# Patient Record
Sex: Female | Born: 1945
Health system: Southern US, Community
[De-identification: ages and names within clinical notes are randomized; demographics above are authoritative.]

## PROBLEM LIST (undated history)

## (undated) DIAGNOSIS — G959 Disease of spinal cord, unspecified: Secondary | ICD-10-CM

## (undated) DIAGNOSIS — G35 Multiple sclerosis: Secondary | ICD-10-CM

## (undated) DIAGNOSIS — M810 Age-related osteoporosis without current pathological fracture: Secondary | ICD-10-CM

## (undated) HISTORY — DX: Disease of spinal cord, unspecified: G95.9

## (undated) HISTORY — DX: Age-related osteoporosis without current pathological fracture: M81.0

## (undated) HISTORY — PX: CERVICAL FUSION: SHX112

## (undated) HISTORY — DX: Multiple sclerosis: G35

---

## 1998-08-09 ENCOUNTER — Other Ambulatory Visit: Admission: RE | Admit: 1998-08-09 | Discharge: 1998-08-09 | Payer: Self-pay | Admitting: Obstetrics and Gynecology

## 1999-06-14 ENCOUNTER — Other Ambulatory Visit: Admission: RE | Admit: 1999-06-14 | Discharge: 1999-06-14 | Payer: Self-pay | Admitting: Gynecology

## 1999-06-26 ENCOUNTER — Encounter: Payer: Self-pay | Admitting: Gynecology

## 1999-06-26 ENCOUNTER — Encounter: Admission: RE | Admit: 1999-06-26 | Discharge: 1999-06-26 | Payer: Self-pay | Admitting: Gynecology

## 2000-06-24 ENCOUNTER — Other Ambulatory Visit: Admission: RE | Admit: 2000-06-24 | Discharge: 2000-06-24 | Payer: Self-pay | Admitting: Gynecology

## 2000-06-26 ENCOUNTER — Encounter: Admission: RE | Admit: 2000-06-26 | Discharge: 2000-06-26 | Payer: Self-pay | Admitting: Gynecology

## 2000-06-26 ENCOUNTER — Encounter: Payer: Self-pay | Admitting: Gynecology

## 2001-02-03 ENCOUNTER — Encounter: Payer: Self-pay | Admitting: Sports Medicine

## 2001-02-03 ENCOUNTER — Ambulatory Visit (HOSPITAL_COMMUNITY): Admission: RE | Admit: 2001-02-03 | Discharge: 2001-02-03 | Payer: Self-pay | Admitting: Sports Medicine

## 2001-06-25 ENCOUNTER — Other Ambulatory Visit: Admission: RE | Admit: 2001-06-25 | Discharge: 2001-06-25 | Payer: Self-pay | Admitting: *Deleted

## 2001-06-28 ENCOUNTER — Encounter: Payer: Self-pay | Admitting: Gynecology

## 2001-06-28 ENCOUNTER — Encounter: Admission: RE | Admit: 2001-06-28 | Discharge: 2001-06-28 | Payer: Self-pay | Admitting: Gynecology

## 2001-12-11 ENCOUNTER — Encounter: Payer: Self-pay | Admitting: Orthopedic Surgery

## 2001-12-11 ENCOUNTER — Ambulatory Visit (HOSPITAL_COMMUNITY): Admission: RE | Admit: 2001-12-11 | Discharge: 2001-12-11 | Payer: Self-pay | Admitting: Orthopedic Surgery

## 2001-12-20 ENCOUNTER — Encounter: Admission: RE | Admit: 2001-12-20 | Discharge: 2001-12-20 | Payer: Self-pay | Admitting: Family Medicine

## 2001-12-20 ENCOUNTER — Encounter: Payer: Self-pay | Admitting: Family Medicine

## 2002-01-25 ENCOUNTER — Encounter: Payer: Self-pay | Admitting: Neurosurgery

## 2002-01-25 ENCOUNTER — Ambulatory Visit (HOSPITAL_COMMUNITY): Admission: RE | Admit: 2002-01-25 | Discharge: 2002-01-25 | Payer: Self-pay | Admitting: Neurosurgery

## 2002-03-09 ENCOUNTER — Encounter: Payer: Self-pay | Admitting: Neurosurgery

## 2002-03-09 ENCOUNTER — Encounter: Admission: RE | Admit: 2002-03-09 | Discharge: 2002-03-09 | Payer: Self-pay | Admitting: Neurosurgery

## 2002-06-14 ENCOUNTER — Encounter: Admission: RE | Admit: 2002-06-14 | Discharge: 2002-06-14 | Payer: Self-pay | Admitting: Neurosurgery

## 2002-06-14 ENCOUNTER — Encounter: Payer: Self-pay | Admitting: Neurosurgery

## 2002-06-28 ENCOUNTER — Other Ambulatory Visit: Admission: RE | Admit: 2002-06-28 | Discharge: 2002-06-28 | Payer: Self-pay | Admitting: Gynecology

## 2002-06-30 ENCOUNTER — Encounter: Admission: RE | Admit: 2002-06-30 | Discharge: 2002-06-30 | Payer: Self-pay | Admitting: Family Medicine

## 2002-06-30 ENCOUNTER — Encounter: Payer: Self-pay | Admitting: Family Medicine

## 2002-10-21 ENCOUNTER — Encounter: Payer: Self-pay | Admitting: Neurosurgery

## 2002-10-21 ENCOUNTER — Ambulatory Visit (HOSPITAL_COMMUNITY): Admission: RE | Admit: 2002-10-21 | Discharge: 2002-10-21 | Payer: Self-pay | Admitting: Neurosurgery

## 2003-04-10 ENCOUNTER — Ambulatory Visit (HOSPITAL_COMMUNITY): Admission: RE | Admit: 2003-04-10 | Discharge: 2003-04-10 | Payer: Self-pay | Admitting: *Deleted

## 2003-07-04 ENCOUNTER — Ambulatory Visit (HOSPITAL_COMMUNITY): Admission: RE | Admit: 2003-07-04 | Discharge: 2003-07-04 | Payer: Self-pay | Admitting: Family Medicine

## 2003-10-08 ENCOUNTER — Ambulatory Visit (HOSPITAL_COMMUNITY): Admission: RE | Admit: 2003-10-08 | Discharge: 2003-10-08 | Payer: Self-pay | Admitting: Neurology

## 2004-07-04 ENCOUNTER — Ambulatory Visit (HOSPITAL_COMMUNITY): Admission: RE | Admit: 2004-07-04 | Discharge: 2004-07-04 | Payer: Self-pay | Admitting: Family Medicine

## 2005-07-08 ENCOUNTER — Ambulatory Visit (HOSPITAL_COMMUNITY): Admission: RE | Admit: 2005-07-08 | Discharge: 2005-07-08 | Payer: Self-pay | Admitting: Family Medicine

## 2006-07-22 ENCOUNTER — Ambulatory Visit (HOSPITAL_COMMUNITY): Admission: RE | Admit: 2006-07-22 | Discharge: 2006-07-22 | Payer: Self-pay | Admitting: Family Medicine

## 2006-07-30 ENCOUNTER — Encounter: Admission: RE | Admit: 2006-07-30 | Discharge: 2006-07-30 | Payer: Self-pay | Admitting: Family Medicine

## 2007-07-26 ENCOUNTER — Ambulatory Visit (HOSPITAL_COMMUNITY): Admission: RE | Admit: 2007-07-26 | Discharge: 2007-07-26 | Payer: Self-pay | Admitting: Family Medicine

## 2008-05-24 ENCOUNTER — Encounter
Admission: RE | Admit: 2008-05-24 | Discharge: 2008-07-27 | Payer: Self-pay | Admitting: Physical Medicine and Rehabilitation

## 2008-07-26 ENCOUNTER — Ambulatory Visit (HOSPITAL_COMMUNITY): Admission: RE | Admit: 2008-07-26 | Discharge: 2008-07-26 | Payer: Self-pay | Admitting: Family Medicine

## 2009-08-01 ENCOUNTER — Ambulatory Visit (HOSPITAL_COMMUNITY): Admission: RE | Admit: 2009-08-01 | Discharge: 2009-08-01 | Payer: Self-pay | Admitting: Family Medicine

## 2010-02-10 ENCOUNTER — Encounter: Payer: Self-pay | Admitting: Family Medicine

## 2010-06-07 NOTE — Op Note (Signed)
NAME:  Amy Bray, Amy Bray                          ACCOUNT NO.:  [id number]   MEDICAL RECORD NO.:  [id number]                   PATIENT TYPE:  OIB   LOCATION:  3172                                 FACILITY:  [facility]   PHYSICIAN:  Donalee Citrin, M.D.                     DATE OF BIRTH:  January 06, 1946   DATE OF PROCEDURE:  01/25/2001  DATE OF DISCHARGE:                                 OPERATIVE REPORT   PREOPERATIVE DIAGNOSIS:  Cervical spondylitic myelopathy from severe spinal  stenosis C4-5.   POSTOPERATIVE DIAGNOSIS:  Cervical spondylitic myelopathy from severe spinal  stenosis C4-5.   PROCEDURE:  Anterior cervical diskectomy and fusion at C4-5 using a 6 mm  patellar wedge  and a 22.5 mm Zephir plate for __________   SURGEON:  Donalee Citrin, M.D.   FIRST ASSISTANT:  _____________   INDICATIONS FOR PROCEDURE:  The patient is a very pleasant 65 year old  female whose had progressive worsening over the last several months of  weakness in her hands and difficulty walking. The patient was initially  worked up in the lumbar spine, subsequent thoracic and cervical spine MRI  was ordered showing severe spinal stenosis at C4-5 otherwise some  spondylitic disease in the cervical spine and normal thoracic spine. This is  felt to be consistent with her myelopathic exam and the patient was  recommended anterior cervical diskectomy and fusion. The risks and benefits  were explained to the patient ___________.   DESCRIPTION OF PROCEDURE:  The patient was brought to the OR, received  general endotracheal anesthesia, positioned supine. Next, extension with 5  pounds of halter traction, the right side of the neck was prepped in the  usual fashion. Preoperative x-ray localized the needle over C4-5 disk space.  A curvilinear incision was made just off the midline to the anterior plate  of the sternocleidomastoid with a #20 blade scalpel. Then the superficial  layer of the platysma was dissected down and divided  longitudinally.  Then  ___________ sternocleidomastoid and strap muscles was developed down to the  prevertebral fascia. The prevertebral fascia was dissected away with  Kitner's. Then intraoperative x-rays confirmed our position in the C4-5 disk  space and this was entered with a #15 blade scalpel. Pituitary rongeurs were  used to remove the anterior portion of the annulus and pituitary rongeurs  were used to remove the anterior osteophytes coming off the C4 vertebral  body. Then using a high speed drill, the end plates were drilled down to the  posterior osteophytes and posterior longitudinal ligament. They had a very  large osteophytic process coming off the C4 vertebral body compressing the  spinal cord.  This was all under bitten with the 1 mm Kerrison punch and the  posterior ___________was identified and removed in a piecemeal fashion.  There was a fair amount of compression of the spinal cord from the uncinate  process of C5 and uncinate process and C4.  Then both C5 neural foramen were  widely opened up using a 1 and 2 mm Kerrison punch and the thecal sac was  widely decompressed. At the end of the diskectomy, there was no further  stenosis appreciated. The wound was copiously irrigated and end plates were  prepared to receive the bone graft.  Interbody __________ was placed and the  6 mm patellar wedge was inserted approximately 1-2 mm deep to the anterior  vertebral line.  Then a Zephir plate, 04.5,WUJ the sized, selected, and  inserted. Four 13 mm Dermalon screws were inserted and locked, the set  screws were tightened down. Again, the wound was copiously irrigated and  meticulous hemostasis was maintained.  The platysma was approximated with 3-  0 interrupted Vicryl and the skin was closed with a running 4-0  subcuticular,  Benzoin and Steri-Strips applied. The patient was taken to the recovery room  in stable condition. Postop x-ray confirmed good position of the bone graft,   screws and plate. At the end of the case all counts were correct __________                                               Donalee Citrin, M.D.    GC/MEDQ  D:  01/25/2002  T:  01/25/2002  Job:  [id number]

## 2010-07-01 ENCOUNTER — Other Ambulatory Visit (HOSPITAL_COMMUNITY): Payer: Self-pay | Admitting: Family Medicine

## 2010-07-01 DIAGNOSIS — Z1231 Encounter for screening mammogram for malignant neoplasm of breast: Secondary | ICD-10-CM

## 2010-08-05 ENCOUNTER — Ambulatory Visit (HOSPITAL_COMMUNITY)
Admission: RE | Admit: 2010-08-05 | Discharge: 2010-08-05 | Disposition: A | Discharge status: Home / Self Care | Payer: Medicare Other | Source: Ambulatory Visit | Attending: Family Medicine | Admitting: Family Medicine

## 2010-08-05 DIAGNOSIS — Z1231 Encounter for screening mammogram for malignant neoplasm of breast: Secondary | ICD-10-CM | POA: Insufficient documentation

## 2011-01-31 DIAGNOSIS — R350 Frequency of micturition: Secondary | ICD-10-CM | POA: Diagnosis not present

## 2011-01-31 DIAGNOSIS — R3915 Urgency of urination: Secondary | ICD-10-CM | POA: Diagnosis not present

## 2011-01-31 DIAGNOSIS — N3941 Urge incontinence: Secondary | ICD-10-CM | POA: Diagnosis not present

## 2011-02-03 DIAGNOSIS — M533 Sacrococcygeal disorders, not elsewhere classified: Secondary | ICD-10-CM | POA: Diagnosis not present

## 2011-02-03 DIAGNOSIS — M47817 Spondylosis without myelopathy or radiculopathy, lumbosacral region: Secondary | ICD-10-CM | POA: Diagnosis not present

## 2011-02-03 DIAGNOSIS — G544 Lumbosacral root disorders, not elsewhere classified: Secondary | ICD-10-CM | POA: Diagnosis not present

## 2011-02-03 DIAGNOSIS — M4712 Other spondylosis with myelopathy, cervical region: Secondary | ICD-10-CM | POA: Diagnosis not present

## 2011-02-21 DIAGNOSIS — N3941 Urge incontinence: Secondary | ICD-10-CM | POA: Diagnosis not present

## 2011-02-21 DIAGNOSIS — R3915 Urgency of urination: Secondary | ICD-10-CM | POA: Diagnosis not present

## 2011-02-21 DIAGNOSIS — R350 Frequency of micturition: Secondary | ICD-10-CM | POA: Diagnosis not present

## 2011-03-14 DIAGNOSIS — R3915 Urgency of urination: Secondary | ICD-10-CM | POA: Diagnosis not present

## 2011-03-14 DIAGNOSIS — N3941 Urge incontinence: Secondary | ICD-10-CM | POA: Diagnosis not present

## 2011-03-14 DIAGNOSIS — R350 Frequency of micturition: Secondary | ICD-10-CM | POA: Diagnosis not present

## 2011-04-04 DIAGNOSIS — N3941 Urge incontinence: Secondary | ICD-10-CM | POA: Diagnosis not present

## 2011-04-22 DIAGNOSIS — N3000 Acute cystitis without hematuria: Secondary | ICD-10-CM | POA: Diagnosis not present

## 2011-04-22 DIAGNOSIS — N3941 Urge incontinence: Secondary | ICD-10-CM | POA: Diagnosis not present

## 2011-04-22 DIAGNOSIS — N319 Neuromuscular dysfunction of bladder, unspecified: Secondary | ICD-10-CM | POA: Diagnosis not present

## 2011-04-25 DIAGNOSIS — R350 Frequency of micturition: Secondary | ICD-10-CM | POA: Diagnosis not present

## 2011-04-25 DIAGNOSIS — N3941 Urge incontinence: Secondary | ICD-10-CM | POA: Diagnosis not present

## 2011-04-25 DIAGNOSIS — R3915 Urgency of urination: Secondary | ICD-10-CM | POA: Diagnosis not present

## 2011-05-05 DIAGNOSIS — I10 Essential (primary) hypertension: Secondary | ICD-10-CM | POA: Insufficient documentation

## 2011-05-05 DIAGNOSIS — M5137 Other intervertebral disc degeneration, lumbosacral region: Secondary | ICD-10-CM | POA: Diagnosis not present

## 2011-05-05 DIAGNOSIS — G959 Disease of spinal cord, unspecified: Secondary | ICD-10-CM | POA: Insufficient documentation

## 2011-05-05 DIAGNOSIS — M533 Sacrococcygeal disorders, not elsewhere classified: Secondary | ICD-10-CM | POA: Diagnosis not present

## 2011-05-05 DIAGNOSIS — G2581 Restless legs syndrome: Secondary | ICD-10-CM | POA: Insufficient documentation

## 2011-05-22 DIAGNOSIS — N3000 Acute cystitis without hematuria: Secondary | ICD-10-CM | POA: Diagnosis not present

## 2011-06-09 DIAGNOSIS — M533 Sacrococcygeal disorders, not elsewhere classified: Secondary | ICD-10-CM | POA: Diagnosis not present

## 2011-06-13 DIAGNOSIS — R350 Frequency of micturition: Secondary | ICD-10-CM | POA: Diagnosis not present

## 2011-06-13 DIAGNOSIS — N3941 Urge incontinence: Secondary | ICD-10-CM | POA: Diagnosis not present

## 2011-06-13 DIAGNOSIS — R3915 Urgency of urination: Secondary | ICD-10-CM | POA: Diagnosis not present

## 2011-06-30 ENCOUNTER — Other Ambulatory Visit (HOSPITAL_COMMUNITY): Payer: Self-pay | Admitting: Family Medicine

## 2011-06-30 DIAGNOSIS — Z1231 Encounter for screening mammogram for malignant neoplasm of breast: Secondary | ICD-10-CM

## 2011-07-04 DIAGNOSIS — N3941 Urge incontinence: Secondary | ICD-10-CM | POA: Diagnosis not present

## 2011-07-17 DIAGNOSIS — M949 Disorder of cartilage, unspecified: Secondary | ICD-10-CM | POA: Diagnosis not present

## 2011-07-17 DIAGNOSIS — R82998 Other abnormal findings in urine: Secondary | ICD-10-CM | POA: Diagnosis not present

## 2011-07-17 DIAGNOSIS — R7301 Impaired fasting glucose: Secondary | ICD-10-CM | POA: Diagnosis not present

## 2011-07-17 DIAGNOSIS — Z8371 Family history of colonic polyps: Secondary | ICD-10-CM | POA: Diagnosis not present

## 2011-07-17 DIAGNOSIS — G992 Myelopathy in diseases classified elsewhere: Secondary | ICD-10-CM | POA: Diagnosis not present

## 2011-07-17 DIAGNOSIS — M899 Disorder of bone, unspecified: Secondary | ICD-10-CM | POA: Diagnosis not present

## 2011-07-17 DIAGNOSIS — Z0000 Encounter for general adult medical examination without abnormal findings: Secondary | ICD-10-CM | POA: Diagnosis not present

## 2011-07-17 DIAGNOSIS — I10 Essential (primary) hypertension: Secondary | ICD-10-CM | POA: Diagnosis not present

## 2011-07-21 DIAGNOSIS — H6120 Impacted cerumen, unspecified ear: Secondary | ICD-10-CM | POA: Diagnosis not present

## 2011-07-22 DIAGNOSIS — R350 Frequency of micturition: Secondary | ICD-10-CM | POA: Diagnosis not present

## 2011-07-22 DIAGNOSIS — N3941 Urge incontinence: Secondary | ICD-10-CM | POA: Diagnosis not present

## 2011-07-22 DIAGNOSIS — R3915 Urgency of urination: Secondary | ICD-10-CM | POA: Diagnosis not present

## 2011-08-08 ENCOUNTER — Ambulatory Visit (HOSPITAL_COMMUNITY)
Admission: RE | Admit: 2011-08-08 | Discharge: 2011-08-08 | Disposition: A | Discharge status: Home / Self Care | Payer: Medicare Other | Source: Ambulatory Visit | Attending: Family Medicine | Admitting: Family Medicine

## 2011-08-08 DIAGNOSIS — Z1231 Encounter for screening mammogram for malignant neoplasm of breast: Secondary | ICD-10-CM

## 2011-08-11 DIAGNOSIS — M4712 Other spondylosis with myelopathy, cervical region: Secondary | ICD-10-CM | POA: Diagnosis not present

## 2011-08-11 DIAGNOSIS — M533 Sacrococcygeal disorders, not elsewhere classified: Secondary | ICD-10-CM | POA: Diagnosis not present

## 2011-08-15 DIAGNOSIS — R3915 Urgency of urination: Secondary | ICD-10-CM | POA: Diagnosis not present

## 2011-08-15 DIAGNOSIS — R350 Frequency of micturition: Secondary | ICD-10-CM | POA: Diagnosis not present

## 2011-08-15 DIAGNOSIS — N3941 Urge incontinence: Secondary | ICD-10-CM | POA: Diagnosis not present

## 2011-09-01 DIAGNOSIS — D235 Other benign neoplasm of skin of trunk: Secondary | ICD-10-CM | POA: Diagnosis not present

## 2011-09-01 DIAGNOSIS — D485 Neoplasm of uncertain behavior of skin: Secondary | ICD-10-CM | POA: Diagnosis not present

## 2011-09-01 DIAGNOSIS — L819 Disorder of pigmentation, unspecified: Secondary | ICD-10-CM | POA: Diagnosis not present

## 2011-09-05 DIAGNOSIS — R3915 Urgency of urination: Secondary | ICD-10-CM | POA: Diagnosis not present

## 2011-09-05 DIAGNOSIS — N3941 Urge incontinence: Secondary | ICD-10-CM | POA: Diagnosis not present

## 2011-09-05 DIAGNOSIS — R350 Frequency of micturition: Secondary | ICD-10-CM | POA: Diagnosis not present

## 2011-09-09 DIAGNOSIS — M533 Sacrococcygeal disorders, not elsewhere classified: Secondary | ICD-10-CM | POA: Diagnosis not present

## 2011-09-15 DIAGNOSIS — M533 Sacrococcygeal disorders, not elsewhere classified: Secondary | ICD-10-CM | POA: Diagnosis not present

## 2011-09-19 DIAGNOSIS — M533 Sacrococcygeal disorders, not elsewhere classified: Secondary | ICD-10-CM | POA: Diagnosis not present

## 2011-09-26 DIAGNOSIS — R3915 Urgency of urination: Secondary | ICD-10-CM | POA: Diagnosis not present

## 2011-09-26 DIAGNOSIS — N3941 Urge incontinence: Secondary | ICD-10-CM | POA: Diagnosis not present

## 2011-09-26 DIAGNOSIS — R350 Frequency of micturition: Secondary | ICD-10-CM | POA: Diagnosis not present

## 2011-10-03 DIAGNOSIS — M533 Sacrococcygeal disorders, not elsewhere classified: Secondary | ICD-10-CM | POA: Diagnosis not present

## 2011-10-13 DIAGNOSIS — D485 Neoplasm of uncertain behavior of skin: Secondary | ICD-10-CM | POA: Diagnosis not present

## 2011-10-13 DIAGNOSIS — L905 Scar conditions and fibrosis of skin: Secondary | ICD-10-CM | POA: Diagnosis not present

## 2011-10-17 DIAGNOSIS — N3941 Urge incontinence: Secondary | ICD-10-CM | POA: Diagnosis not present

## 2011-10-17 DIAGNOSIS — R3915 Urgency of urination: Secondary | ICD-10-CM | POA: Diagnosis not present

## 2011-10-17 DIAGNOSIS — R350 Frequency of micturition: Secondary | ICD-10-CM | POA: Diagnosis not present

## 2011-10-29 DIAGNOSIS — G2581 Restless legs syndrome: Secondary | ICD-10-CM | POA: Diagnosis not present

## 2011-10-29 DIAGNOSIS — R279 Unspecified lack of coordination: Secondary | ICD-10-CM | POA: Diagnosis not present

## 2011-10-29 DIAGNOSIS — G5610 Other lesions of median nerve, unspecified upper limb: Secondary | ICD-10-CM | POA: Diagnosis not present

## 2011-10-29 DIAGNOSIS — G9589 Other specified diseases of spinal cord: Secondary | ICD-10-CM | POA: Diagnosis not present

## 2011-10-29 DIAGNOSIS — G959 Disease of spinal cord, unspecified: Secondary | ICD-10-CM | POA: Diagnosis not present

## 2011-10-29 DIAGNOSIS — R209 Unspecified disturbances of skin sensation: Secondary | ICD-10-CM | POA: Diagnosis not present

## 2011-10-29 DIAGNOSIS — M5412 Radiculopathy, cervical region: Secondary | ICD-10-CM | POA: Diagnosis not present

## 2011-10-29 DIAGNOSIS — M216X9 Other acquired deformities of unspecified foot: Secondary | ICD-10-CM | POA: Diagnosis not present

## 2011-10-29 DIAGNOSIS — IMO0002 Reserved for concepts with insufficient information to code with codable children: Secondary | ICD-10-CM | POA: Diagnosis not present

## 2011-10-29 DIAGNOSIS — E531 Pyridoxine deficiency: Secondary | ICD-10-CM | POA: Diagnosis not present

## 2011-10-29 DIAGNOSIS — R634 Abnormal weight loss: Secondary | ICD-10-CM | POA: Diagnosis not present

## 2011-11-06 DIAGNOSIS — R930 Abnormal findings on diagnostic imaging of skull and head, not elsewhere classified: Secondary | ICD-10-CM | POA: Diagnosis not present

## 2011-11-06 DIAGNOSIS — M47814 Spondylosis without myelopathy or radiculopathy, thoracic region: Secondary | ICD-10-CM | POA: Diagnosis not present

## 2011-11-06 DIAGNOSIS — R5381 Other malaise: Secondary | ICD-10-CM | POA: Diagnosis not present

## 2011-11-06 DIAGNOSIS — R5383 Other fatigue: Secondary | ICD-10-CM | POA: Diagnosis not present

## 2011-11-06 DIAGNOSIS — M5126 Other intervertebral disc displacement, lumbar region: Secondary | ICD-10-CM | POA: Diagnosis not present

## 2011-11-07 DIAGNOSIS — N3941 Urge incontinence: Secondary | ICD-10-CM | POA: Diagnosis not present

## 2011-11-28 DIAGNOSIS — N3941 Urge incontinence: Secondary | ICD-10-CM | POA: Diagnosis not present

## 2011-11-28 DIAGNOSIS — R350 Frequency of micturition: Secondary | ICD-10-CM | POA: Diagnosis not present

## 2011-11-28 DIAGNOSIS — R3915 Urgency of urination: Secondary | ICD-10-CM | POA: Diagnosis not present

## 2011-12-29 DIAGNOSIS — M5000 Cervical disc disorder with myelopathy, unspecified cervical region: Secondary | ICD-10-CM | POA: Diagnosis not present

## 2011-12-29 DIAGNOSIS — M533 Sacrococcygeal disorders, not elsewhere classified: Secondary | ICD-10-CM | POA: Diagnosis not present

## 2012-01-02 DIAGNOSIS — N3941 Urge incontinence: Secondary | ICD-10-CM | POA: Diagnosis not present

## 2012-01-02 DIAGNOSIS — R350 Frequency of micturition: Secondary | ICD-10-CM | POA: Diagnosis not present

## 2012-01-02 DIAGNOSIS — R3915 Urgency of urination: Secondary | ICD-10-CM | POA: Diagnosis not present

## 2012-01-05 DIAGNOSIS — L905 Scar conditions and fibrosis of skin: Secondary | ICD-10-CM | POA: Diagnosis not present

## 2012-01-07 DIAGNOSIS — R292 Abnormal reflex: Secondary | ICD-10-CM | POA: Diagnosis not present

## 2012-01-07 DIAGNOSIS — M5412 Radiculopathy, cervical region: Secondary | ICD-10-CM | POA: Diagnosis not present

## 2012-01-07 DIAGNOSIS — IMO0002 Reserved for concepts with insufficient information to code with codable children: Secondary | ICD-10-CM | POA: Diagnosis not present

## 2012-01-07 DIAGNOSIS — M216X9 Other acquired deformities of unspecified foot: Secondary | ICD-10-CM | POA: Diagnosis not present

## 2012-01-07 DIAGNOSIS — G2581 Restless legs syndrome: Secondary | ICD-10-CM | POA: Diagnosis not present

## 2012-01-30 DIAGNOSIS — R3915 Urgency of urination: Secondary | ICD-10-CM | POA: Diagnosis not present

## 2012-01-30 DIAGNOSIS — R350 Frequency of micturition: Secondary | ICD-10-CM | POA: Diagnosis not present

## 2012-01-30 DIAGNOSIS — N3941 Urge incontinence: Secondary | ICD-10-CM | POA: Diagnosis not present

## 2012-02-05 DIAGNOSIS — M533 Sacrococcygeal disorders, not elsewhere classified: Secondary | ICD-10-CM | POA: Diagnosis not present

## 2012-02-05 DIAGNOSIS — M4712 Other spondylosis with myelopathy, cervical region: Secondary | ICD-10-CM | POA: Diagnosis not present

## 2012-02-05 DIAGNOSIS — Z79899 Other long term (current) drug therapy: Secondary | ICD-10-CM | POA: Diagnosis not present

## 2012-02-27 DIAGNOSIS — R3915 Urgency of urination: Secondary | ICD-10-CM | POA: Diagnosis not present

## 2012-02-27 DIAGNOSIS — N3941 Urge incontinence: Secondary | ICD-10-CM | POA: Diagnosis not present

## 2012-02-27 DIAGNOSIS — R350 Frequency of micturition: Secondary | ICD-10-CM | POA: Diagnosis not present

## 2012-03-10 DIAGNOSIS — G2581 Restless legs syndrome: Secondary | ICD-10-CM | POA: Diagnosis not present

## 2012-03-10 DIAGNOSIS — IMO0002 Reserved for concepts with insufficient information to code with codable children: Secondary | ICD-10-CM | POA: Diagnosis not present

## 2012-03-10 DIAGNOSIS — M5412 Radiculopathy, cervical region: Secondary | ICD-10-CM | POA: Diagnosis not present

## 2012-04-05 DIAGNOSIS — M533 Sacrococcygeal disorders, not elsewhere classified: Secondary | ICD-10-CM | POA: Diagnosis not present

## 2012-04-05 DIAGNOSIS — G5770 Causalgia of unspecified lower limb: Secondary | ICD-10-CM | POA: Diagnosis not present

## 2012-04-23 DIAGNOSIS — N3941 Urge incontinence: Secondary | ICD-10-CM | POA: Diagnosis not present

## 2012-05-06 DIAGNOSIS — G5600 Carpal tunnel syndrome, unspecified upper limb: Secondary | ICD-10-CM | POA: Diagnosis not present

## 2012-05-06 DIAGNOSIS — G2581 Restless legs syndrome: Secondary | ICD-10-CM | POA: Diagnosis not present

## 2012-05-06 DIAGNOSIS — IMO0002 Reserved for concepts with insufficient information to code with codable children: Secondary | ICD-10-CM | POA: Diagnosis not present

## 2012-05-06 DIAGNOSIS — R292 Abnormal reflex: Secondary | ICD-10-CM | POA: Diagnosis not present

## 2012-05-06 DIAGNOSIS — M5412 Radiculopathy, cervical region: Secondary | ICD-10-CM | POA: Diagnosis not present

## 2012-05-14 DIAGNOSIS — N3000 Acute cystitis without hematuria: Secondary | ICD-10-CM | POA: Diagnosis not present

## 2012-05-17 DIAGNOSIS — H2510 Age-related nuclear cataract, unspecified eye: Secondary | ICD-10-CM | POA: Diagnosis not present

## 2012-05-19 DIAGNOSIS — M533 Sacrococcygeal disorders, not elsewhere classified: Secondary | ICD-10-CM | POA: Diagnosis not present

## 2012-05-21 DIAGNOSIS — R350 Frequency of micturition: Secondary | ICD-10-CM | POA: Diagnosis not present

## 2012-05-21 DIAGNOSIS — R3915 Urgency of urination: Secondary | ICD-10-CM | POA: Diagnosis not present

## 2012-05-21 DIAGNOSIS — N3941 Urge incontinence: Secondary | ICD-10-CM | POA: Diagnosis not present

## 2012-06-18 DIAGNOSIS — N3941 Urge incontinence: Secondary | ICD-10-CM | POA: Diagnosis not present

## 2012-07-15 ENCOUNTER — Other Ambulatory Visit (HOSPITAL_COMMUNITY): Payer: Self-pay | Admitting: Family Medicine

## 2012-07-15 DIAGNOSIS — Z1231 Encounter for screening mammogram for malignant neoplasm of breast: Secondary | ICD-10-CM

## 2012-08-09 ENCOUNTER — Ambulatory Visit (HOSPITAL_COMMUNITY)
Admission: RE | Admit: 2012-08-09 | Discharge: 2012-08-09 | Disposition: A | Discharge status: Home / Self Care | Payer: Medicare Other | Source: Ambulatory Visit | Attending: Family Medicine | Admitting: Family Medicine

## 2012-08-09 DIAGNOSIS — Z1231 Encounter for screening mammogram for malignant neoplasm of breast: Secondary | ICD-10-CM

## 2012-08-13 DIAGNOSIS — N3941 Urge incontinence: Secondary | ICD-10-CM | POA: Diagnosis not present

## 2012-08-23 DIAGNOSIS — M533 Sacrococcygeal disorders, not elsewhere classified: Secondary | ICD-10-CM | POA: Diagnosis not present

## 2012-09-10 DIAGNOSIS — R3915 Urgency of urination: Secondary | ICD-10-CM | POA: Diagnosis not present

## 2012-09-10 DIAGNOSIS — R350 Frequency of micturition: Secondary | ICD-10-CM | POA: Diagnosis not present

## 2012-09-10 DIAGNOSIS — N3941 Urge incontinence: Secondary | ICD-10-CM | POA: Diagnosis not present

## 2012-09-16 DIAGNOSIS — Z79899 Other long term (current) drug therapy: Secondary | ICD-10-CM | POA: Diagnosis not present

## 2012-09-16 DIAGNOSIS — M533 Sacrococcygeal disorders, not elsewhere classified: Secondary | ICD-10-CM | POA: Diagnosis not present

## 2012-10-01 DIAGNOSIS — R3915 Urgency of urination: Secondary | ICD-10-CM | POA: Diagnosis not present

## 2012-10-01 DIAGNOSIS — N3941 Urge incontinence: Secondary | ICD-10-CM | POA: Diagnosis not present

## 2012-10-01 DIAGNOSIS — R350 Frequency of micturition: Secondary | ICD-10-CM | POA: Diagnosis not present

## 2012-10-11 DIAGNOSIS — M545 Low back pain, unspecified: Secondary | ICD-10-CM | POA: Diagnosis not present

## 2012-10-11 DIAGNOSIS — Z01818 Encounter for other preprocedural examination: Secondary | ICD-10-CM | POA: Diagnosis not present

## 2012-10-11 DIAGNOSIS — M533 Sacrococcygeal disorders, not elsewhere classified: Secondary | ICD-10-CM | POA: Diagnosis not present

## 2012-10-11 DIAGNOSIS — Z79899 Other long term (current) drug therapy: Secondary | ICD-10-CM | POA: Diagnosis not present

## 2012-10-11 DIAGNOSIS — M5000 Cervical disc disorder with myelopathy, unspecified cervical region: Secondary | ICD-10-CM | POA: Diagnosis not present

## 2012-10-21 DIAGNOSIS — Z0000 Encounter for general adult medical examination without abnormal findings: Secondary | ICD-10-CM | POA: Diagnosis not present

## 2012-10-21 DIAGNOSIS — H6120 Impacted cerumen, unspecified ear: Secondary | ICD-10-CM | POA: Diagnosis not present

## 2012-10-21 DIAGNOSIS — R7301 Impaired fasting glucose: Secondary | ICD-10-CM | POA: Diagnosis not present

## 2012-10-21 DIAGNOSIS — I10 Essential (primary) hypertension: Secondary | ICD-10-CM | POA: Diagnosis not present

## 2012-10-21 DIAGNOSIS — Z23 Encounter for immunization: Secondary | ICD-10-CM | POA: Diagnosis not present

## 2012-10-21 DIAGNOSIS — M949 Disorder of cartilage, unspecified: Secondary | ICD-10-CM | POA: Diagnosis not present

## 2012-10-21 DIAGNOSIS — G992 Myelopathy in diseases classified elsewhere: Secondary | ICD-10-CM | POA: Diagnosis not present

## 2012-10-21 DIAGNOSIS — M899 Disorder of bone, unspecified: Secondary | ICD-10-CM | POA: Diagnosis not present

## 2012-10-22 DIAGNOSIS — R350 Frequency of micturition: Secondary | ICD-10-CM | POA: Diagnosis not present

## 2012-10-22 DIAGNOSIS — R3915 Urgency of urination: Secondary | ICD-10-CM | POA: Diagnosis not present

## 2012-10-22 DIAGNOSIS — N3941 Urge incontinence: Secondary | ICD-10-CM | POA: Diagnosis not present

## 2012-11-10 DIAGNOSIS — M533 Sacrococcygeal disorders, not elsewhere classified: Secondary | ICD-10-CM | POA: Diagnosis not present

## 2012-11-12 DIAGNOSIS — N3941 Urge incontinence: Secondary | ICD-10-CM | POA: Diagnosis not present

## 2012-11-12 DIAGNOSIS — R3915 Urgency of urination: Secondary | ICD-10-CM | POA: Diagnosis not present

## 2012-11-12 DIAGNOSIS — R350 Frequency of micturition: Secondary | ICD-10-CM | POA: Diagnosis not present

## 2012-12-03 DIAGNOSIS — R350 Frequency of micturition: Secondary | ICD-10-CM | POA: Diagnosis not present

## 2012-12-03 DIAGNOSIS — R3915 Urgency of urination: Secondary | ICD-10-CM | POA: Diagnosis not present

## 2012-12-03 DIAGNOSIS — N3941 Urge incontinence: Secondary | ICD-10-CM | POA: Diagnosis not present

## 2012-12-24 DIAGNOSIS — R3915 Urgency of urination: Secondary | ICD-10-CM | POA: Diagnosis not present

## 2012-12-24 DIAGNOSIS — N3941 Urge incontinence: Secondary | ICD-10-CM | POA: Diagnosis not present

## 2012-12-24 DIAGNOSIS — R350 Frequency of micturition: Secondary | ICD-10-CM | POA: Diagnosis not present

## 2013-01-04 DIAGNOSIS — L819 Disorder of pigmentation, unspecified: Secondary | ICD-10-CM | POA: Diagnosis not present

## 2013-01-04 DIAGNOSIS — L821 Other seborrheic keratosis: Secondary | ICD-10-CM | POA: Diagnosis not present

## 2013-01-04 DIAGNOSIS — L820 Inflamed seborrheic keratosis: Secondary | ICD-10-CM | POA: Diagnosis not present

## 2013-01-04 DIAGNOSIS — D1801 Hemangioma of skin and subcutaneous tissue: Secondary | ICD-10-CM | POA: Diagnosis not present

## 2013-01-04 DIAGNOSIS — D235 Other benign neoplasm of skin of trunk: Secondary | ICD-10-CM | POA: Diagnosis not present

## 2013-01-21 DIAGNOSIS — N3941 Urge incontinence: Secondary | ICD-10-CM | POA: Diagnosis not present

## 2013-01-31 DIAGNOSIS — IMO0002 Reserved for concepts with insufficient information to code with codable children: Secondary | ICD-10-CM | POA: Diagnosis not present

## 2013-01-31 DIAGNOSIS — M5410 Radiculopathy, site unspecified: Secondary | ICD-10-CM | POA: Insufficient documentation

## 2013-02-14 DIAGNOSIS — M533 Sacrococcygeal disorders, not elsewhere classified: Secondary | ICD-10-CM | POA: Diagnosis not present

## 2013-02-14 DIAGNOSIS — IMO0002 Reserved for concepts with insufficient information to code with codable children: Secondary | ICD-10-CM | POA: Diagnosis not present

## 2013-02-18 DIAGNOSIS — R3915 Urgency of urination: Secondary | ICD-10-CM | POA: Diagnosis not present

## 2013-02-18 DIAGNOSIS — N3941 Urge incontinence: Secondary | ICD-10-CM | POA: Diagnosis not present

## 2013-02-18 DIAGNOSIS — R350 Frequency of micturition: Secondary | ICD-10-CM | POA: Diagnosis not present

## 2013-03-04 DIAGNOSIS — Z79899 Other long term (current) drug therapy: Secondary | ICD-10-CM | POA: Diagnosis not present

## 2013-03-04 DIAGNOSIS — IMO0002 Reserved for concepts with insufficient information to code with codable children: Secondary | ICD-10-CM | POA: Diagnosis not present

## 2013-03-04 DIAGNOSIS — M533 Sacrococcygeal disorders, not elsewhere classified: Secondary | ICD-10-CM | POA: Diagnosis not present

## 2013-04-01 DIAGNOSIS — R3915 Urgency of urination: Secondary | ICD-10-CM | POA: Diagnosis not present

## 2013-04-01 DIAGNOSIS — R350 Frequency of micturition: Secondary | ICD-10-CM | POA: Diagnosis not present

## 2013-04-01 DIAGNOSIS — N3941 Urge incontinence: Secondary | ICD-10-CM | POA: Diagnosis not present

## 2013-04-29 DIAGNOSIS — N3941 Urge incontinence: Secondary | ICD-10-CM | POA: Diagnosis not present

## 2013-04-29 DIAGNOSIS — R350 Frequency of micturition: Secondary | ICD-10-CM | POA: Diagnosis not present

## 2013-04-29 DIAGNOSIS — R3915 Urgency of urination: Secondary | ICD-10-CM | POA: Diagnosis not present

## 2013-05-19 DIAGNOSIS — H2510 Age-related nuclear cataract, unspecified eye: Secondary | ICD-10-CM | POA: Diagnosis not present

## 2013-05-27 DIAGNOSIS — N3941 Urge incontinence: Secondary | ICD-10-CM | POA: Diagnosis not present

## 2013-06-24 DIAGNOSIS — N3941 Urge incontinence: Secondary | ICD-10-CM | POA: Diagnosis not present

## 2013-07-05 ENCOUNTER — Other Ambulatory Visit (HOSPITAL_COMMUNITY): Payer: Self-pay | Admitting: Family Medicine

## 2013-07-05 DIAGNOSIS — Z1231 Encounter for screening mammogram for malignant neoplasm of breast: Secondary | ICD-10-CM

## 2013-07-05 DIAGNOSIS — M8580 Other specified disorders of bone density and structure, unspecified site: Secondary | ICD-10-CM

## 2013-07-11 DIAGNOSIS — M533 Sacrococcygeal disorders, not elsewhere classified: Secondary | ICD-10-CM | POA: Diagnosis not present

## 2013-07-11 DIAGNOSIS — M549 Dorsalgia, unspecified: Secondary | ICD-10-CM | POA: Diagnosis not present

## 2013-07-11 DIAGNOSIS — IMO0002 Reserved for concepts with insufficient information to code with codable children: Secondary | ICD-10-CM | POA: Diagnosis not present

## 2013-07-29 DIAGNOSIS — N3941 Urge incontinence: Secondary | ICD-10-CM | POA: Diagnosis not present

## 2013-08-09 DIAGNOSIS — N3941 Urge incontinence: Secondary | ICD-10-CM | POA: Diagnosis not present

## 2013-08-09 DIAGNOSIS — N3000 Acute cystitis without hematuria: Secondary | ICD-10-CM | POA: Diagnosis not present

## 2013-08-09 DIAGNOSIS — N319 Neuromuscular dysfunction of bladder, unspecified: Secondary | ICD-10-CM | POA: Diagnosis not present

## 2013-08-10 ENCOUNTER — Other Ambulatory Visit (HOSPITAL_COMMUNITY): Payer: Self-pay | Admitting: Family Medicine

## 2013-08-10 ENCOUNTER — Ambulatory Visit (HOSPITAL_COMMUNITY)
Admission: RE | Admit: 2013-08-10 | Discharge: 2013-08-10 | Disposition: A | Discharge status: Home / Self Care | Payer: Medicare Other | Source: Ambulatory Visit | Attending: Family Medicine | Admitting: Family Medicine

## 2013-08-10 DIAGNOSIS — Z1231 Encounter for screening mammogram for malignant neoplasm of breast: Secondary | ICD-10-CM

## 2013-08-10 DIAGNOSIS — Z13820 Encounter for screening for osteoporosis: Secondary | ICD-10-CM | POA: Diagnosis not present

## 2013-08-10 DIAGNOSIS — Z780 Asymptomatic menopausal state: Secondary | ICD-10-CM | POA: Diagnosis not present

## 2013-08-10 DIAGNOSIS — M8580 Other specified disorders of bone density and structure, unspecified site: Secondary | ICD-10-CM

## 2013-08-10 DIAGNOSIS — M810 Age-related osteoporosis without current pathological fracture: Secondary | ICD-10-CM | POA: Diagnosis not present

## 2013-08-17 DIAGNOSIS — Z79899 Other long term (current) drug therapy: Secondary | ICD-10-CM | POA: Diagnosis not present

## 2013-08-17 DIAGNOSIS — M533 Sacrococcygeal disorders, not elsewhere classified: Secondary | ICD-10-CM | POA: Diagnosis not present

## 2013-08-26 DIAGNOSIS — R3915 Urgency of urination: Secondary | ICD-10-CM | POA: Diagnosis not present

## 2013-08-26 DIAGNOSIS — R350 Frequency of micturition: Secondary | ICD-10-CM | POA: Diagnosis not present

## 2013-08-26 DIAGNOSIS — N3941 Urge incontinence: Secondary | ICD-10-CM | POA: Diagnosis not present

## 2013-08-31 DIAGNOSIS — Z1211 Encounter for screening for malignant neoplasm of colon: Secondary | ICD-10-CM | POA: Diagnosis not present

## 2013-08-31 DIAGNOSIS — K648 Other hemorrhoids: Secondary | ICD-10-CM | POA: Diagnosis not present

## 2013-08-31 DIAGNOSIS — D126 Benign neoplasm of colon, unspecified: Secondary | ICD-10-CM | POA: Diagnosis not present

## 2013-08-31 DIAGNOSIS — K620 Anal polyp: Secondary | ICD-10-CM | POA: Diagnosis not present

## 2013-08-31 DIAGNOSIS — Z8371 Family history of colonic polyps: Secondary | ICD-10-CM | POA: Diagnosis not present

## 2013-08-31 DIAGNOSIS — K621 Rectal polyp: Secondary | ICD-10-CM | POA: Diagnosis not present

## 2013-09-23 DIAGNOSIS — N3941 Urge incontinence: Secondary | ICD-10-CM | POA: Diagnosis not present

## 2013-10-15 DIAGNOSIS — Z23 Encounter for immunization: Secondary | ICD-10-CM | POA: Diagnosis not present

## 2013-10-21 DIAGNOSIS — N3941 Urge incontinence: Secondary | ICD-10-CM | POA: Diagnosis not present

## 2013-10-31 DIAGNOSIS — M62838 Other muscle spasm: Secondary | ICD-10-CM | POA: Diagnosis not present

## 2013-10-31 DIAGNOSIS — M79671 Pain in right foot: Secondary | ICD-10-CM | POA: Diagnosis not present

## 2013-10-31 DIAGNOSIS — M79672 Pain in left foot: Secondary | ICD-10-CM | POA: Diagnosis not present

## 2013-11-10 DIAGNOSIS — M810 Age-related osteoporosis without current pathological fracture: Secondary | ICD-10-CM | POA: Diagnosis not present

## 2013-11-10 DIAGNOSIS — Z0000 Encounter for general adult medical examination without abnormal findings: Secondary | ICD-10-CM | POA: Diagnosis not present

## 2013-11-10 DIAGNOSIS — R8290 Unspecified abnormal findings in urine: Secondary | ICD-10-CM | POA: Diagnosis not present

## 2013-11-10 DIAGNOSIS — Z8371 Family history of colonic polyps: Secondary | ICD-10-CM | POA: Diagnosis not present

## 2013-11-10 DIAGNOSIS — Z23 Encounter for immunization: Secondary | ICD-10-CM | POA: Diagnosis not present

## 2013-11-10 DIAGNOSIS — R7301 Impaired fasting glucose: Secondary | ICD-10-CM | POA: Diagnosis not present

## 2013-11-10 DIAGNOSIS — H6123 Impacted cerumen, bilateral: Secondary | ICD-10-CM | POA: Diagnosis not present

## 2013-11-10 DIAGNOSIS — M21372 Foot drop, left foot: Secondary | ICD-10-CM | POA: Diagnosis not present

## 2013-11-10 DIAGNOSIS — I10 Essential (primary) hypertension: Secondary | ICD-10-CM | POA: Diagnosis not present

## 2013-11-14 DIAGNOSIS — M25559 Pain in unspecified hip: Secondary | ICD-10-CM | POA: Diagnosis not present

## 2013-11-14 DIAGNOSIS — M533 Sacrococcygeal disorders, not elsewhere classified: Secondary | ICD-10-CM | POA: Diagnosis not present

## 2013-11-14 DIAGNOSIS — M545 Low back pain: Secondary | ICD-10-CM | POA: Diagnosis not present

## 2013-11-18 DIAGNOSIS — N3941 Urge incontinence: Secondary | ICD-10-CM | POA: Diagnosis not present

## 2013-11-18 DIAGNOSIS — R350 Frequency of micturition: Secondary | ICD-10-CM | POA: Diagnosis not present

## 2013-11-18 DIAGNOSIS — R3915 Urgency of urination: Secondary | ICD-10-CM | POA: Diagnosis not present

## 2013-11-21 ENCOUNTER — Encounter: Payer: Self-pay | Admitting: Diagnostic Neuroimaging

## 2013-11-21 ENCOUNTER — Ambulatory Visit (INDEPENDENT_AMBULATORY_CARE_PROVIDER_SITE_OTHER): Payer: Medicare Other | Admitting: Diagnostic Neuroimaging

## 2013-11-21 VITALS — BP 106/71 | HR 98 | Ht 61.0 in | Wt 115.0 lb

## 2013-11-21 DIAGNOSIS — R29898 Other symptoms and signs involving the musculoskeletal system: Secondary | ICD-10-CM | POA: Diagnosis not present

## 2013-11-21 DIAGNOSIS — G8220 Paraplegia, unspecified: Secondary | ICD-10-CM

## 2013-11-21 DIAGNOSIS — R258 Other abnormal involuntary movements: Secondary | ICD-10-CM

## 2013-11-21 DIAGNOSIS — R252 Cramp and spasm: Secondary | ICD-10-CM

## 2013-11-21 DIAGNOSIS — G959 Disease of spinal cord, unspecified: Secondary | ICD-10-CM | POA: Diagnosis not present

## 2013-11-21 NOTE — Progress Notes (Signed)
[facility]  PATIENT: Amy Bray DOB: [birth year]  REFERRING CLINICIAN: Donnie Mesa HISTORY FROM: patient and husband  REASON FOR VISIT: new consult   HISTORICAL  CHIEF COMPLAINT:  Chief Complaint  Patient presents with  . Neurologic Problem    foot drop    HISTORY OF PRESENT ILLNESS:   68 year old right-handed female here for valuation of lower extremity spasms and weakness.  2002 patient developed onset of right leg stiffness, spasm, weakness. Patient was diagnosed with cervical spine disease, bone spur enriching, and treated with anterior cervical discectomy and fusion in 2003. Following this her right leg weakness and spasms significantly improved. However one year later her symptoms return. Patient followed up with several surgeons and ultimately had a second cervical spine surgery in 2006. Following this patient had slight improvement of right leg however within several months her weakness, spasms and gait continued to decline. Patient transitioned needing a cane to walk.  2013 patient was a value by a neurologist in [place], to evaluate her right leg weakness. Apparently patient had a "multiple sclerosis" workup including MRI of the brain and extensive blood testing. Patient also had EMG nerve conduction studies looking for peroneal neuropathy but apparently all studies were normal. MRI of the brain did show a right medulla lesion which was reported as a stroke.  Over the past 1 year patient has developed new onset left leg weakness, spasm, stiffness. Patient is followed up with orthopedic surgery who then referred patient to me for evaluation. Last MRI of cervical spine was in 2006 at [facility].  No bowel or bladder incontinence. No significant numbness or tingling. No weakness in upper extremities. No vision loss, vision changes, slurred speech or trouble talking.   REVIEW OF SYSTEMS: Full 14 system review of systems performed and notable only for  weakness.  ALLERGIES: No Known Allergies  HOME MEDICATIONS: No outpatient prescriptions prior to visit.   No facility-administered medications prior to visit.    PAST MEDICAL HISTORY: History reviewed. No pertinent past medical history.  PAST SURGICAL HISTORY: Past Surgical History  Procedure Laterality Date  . Cervical fusion  01/2001, 01/2004    C3-C6    FAMILY HISTORY: Family History  Problem Relation Age of Onset  . Transient ischemic attack Mother   . Pancreatic cancer Father     SOCIAL HISTORY:  History   Social History  . Marital Status: Married    Spouse Name: Taraji Mungo    Number of Children: 0  . Years of Education: College   Occupational History  .  Other    n/a   Social History Main Topics  . Smoking status: Former Smoker -- 1.00 packs/day for 15 years    Types: Cigarettes    Quit date: 01/21/1983  . Smokeless tobacco: Never Used  . Alcohol Use: 0.0 oz/week    0 Not specified per week     Comment: occasionally- wine  . Drug Use: No  . Sexual Activity: Not on file   Other Topics Concern  . Not on file   Social History Narrative   Patient lives at home with her spouse.   Caffeine Use-none     PHYSICAL EXAM  Filed Vitals:   11/21/13 1044  BP: 106/71  Pulse: 98  Height: 5\' 1"  (1.549 m)  Weight: 115 lb (52.164 kg)    Not recorded      Visual Acuity Screening   Right eye Left eye Both eyes  Without correction:  With correction: 20/70 20/100      Body mass index is 21.74 kg/(m^2).  GENERAL EXAM: Patient is in no distress; well developed, nourished and groomed; neck is supple  CARDIOVASCULAR: Regular rate and rhythm, no murmurs, no carotid bruits  NEUROLOGIC: MENTAL STATUS: awake, alert, oriented to person, place and time, recent and remote memory intact, normal attention and concentration, language fluent, comprehension intact, naming intact, fund of knowledge appropriate CRANIAL NERVE: no papilledema on fundoscopic  exam, pupils equal and reactive to light, visual fields full to confrontation, extraocular muscles intact, no nystagmus, facial sensation and strength symmetric, hearing intact, palate elevates symmetrically, uvula midline, shoulder shrug symmetric, tongue midline. MOTOR: normal bulk and tone in BUE; SIG INCREASED EXT TONE IN BLE; BLE (HF 4, KE 4, KF 3, DF 4) SENSORY: DECR VIB AT TEOS (RIGHT 8 SEC, LEFT 6 SEC); NORMAL PP, TEMP COORDINATION: finger-nose-finger, fine finger movements normal REFLEXES: BUE 3 WITH SPREAD; BLE (KNEES 4, ANKLES 2); POSITIVE HOFFMANS ON LEFT; UPGOING TOES BILATERALLY GAIT/STATION: SPASTIC, PARAPARETIC GAIT; UNSTEADY WITH SINGLE POINT CANE     DIAGNOSTIC DATA (LABS, IMAGING, TESTING) - I reviewed patient records, labs, notes, testing and imaging myself where available.  No results found for: WBC, HGB, HCT, MCV, PLT No results found for: NA, K, CL, CO2, GLUCOSE, BUN, CREATININE, CALCIUM, PROT, ALBUMIN, AST, ALT, ALKPHOS, BILITOT, GFRNONAA, GFRAA No results found for: CHOL, HDL, LDLCALC, LDLDIRECT, TRIG, CHOLHDL No results found for: HGBA1C No results found for: VITAMINB12 No results found for: TSH   I reviewed images myself. In my review, I think there is a 2cm spinal cord lesion (right side, C6-C7) and a smaller left T1 lesion, suspicious for demyelinating disease, on the MRI cervical spine from 10/08/03. No adjacent disc or bone degeneration or spinal stenosis. The MRI report states that the cord appears normal. -VRP  11/06/11 MRI brain (report only) - "subtle area of increased T2 intensity in the right posterior upper medulla likely representing old infarct or possibly area of previous inflammation. Otherwise normal MRI of the brain."  10/08/03 MRI cervical spine (images reviewed) - Satisfactory anterior plate fusion of C4 and C5.Disk degeneration and spondylosis at C3-4 and C5-6 with biforaminal narrowing but no significant central canal stenosis. There is no disk  protrusion.   12/12/01 MRI brain (report only) - negative MR brain  12/12/01 MRI cervical (report only) - MULTILEVEL POSTERIOR OSSEOUS RIDGING AND DISK OSTEOPHYTE COMPLEXES WORST AT THE C3-4, C4-5, AND C5-6 LEVELS. THIS IS RESULTING IN FLATTENING OF THE CORD AT THE C4-5 LEVEL.  12/12/01 MRI thoracic (report only) - normal    ASSESSMENT AND PLAN  68 y.o. year old female here with bilateral lower extremity spasticity, weakness, with brisk reflexes in upper and lower extremities, left Hoffman sign, bilateral upgoing toes. Findings localized to cervical myelopathy. Review of prior MRI from 2005 demonstrates 2 spinal cord T2 hyperintense lesions suspicious for demyelinating disease. This was not mentioned on prior reports. In addition the MRI brain report mentions a lesion which could also represent other focus edema outpatient. I will check MRI brain cervical and thoracic spine to look for etiology of patient's current symptoms.  PLAN: - MRI scans - follow up in 2 weeks  Orders Placed This Encounter  Procedures  . MR Brain W Wo Contrast  . MR Cervical Spine W Wo Contrast  . MR Thoracic Spine W Wo Contrast   Return in about 2 weeks (around 12/05/2013).    Penni Bombard, MD 10/22/5850, 77:82 PM Certified  in Neurology, Neurophysiology and Neuroimaging  [facility] [address], [place], [place] [address] [phone]

## 2013-11-21 NOTE — Patient Instructions (Signed)
I will check additional MRI scans.

## 2013-12-04 ENCOUNTER — Ambulatory Visit
Admission: RE | Admit: 2013-12-04 | Discharge: 2013-12-04 | Disposition: A | Discharge status: Home / Self Care | Payer: Medicare Other | Source: Ambulatory Visit | Attending: Diagnostic Neuroimaging | Admitting: Diagnostic Neuroimaging

## 2013-12-04 DIAGNOSIS — G959 Disease of spinal cord, unspecified: Secondary | ICD-10-CM

## 2013-12-04 DIAGNOSIS — R29898 Other symptoms and signs involving the musculoskeletal system: Secondary | ICD-10-CM

## 2013-12-04 DIAGNOSIS — G8220 Paraplegia, unspecified: Secondary | ICD-10-CM

## 2013-12-04 DIAGNOSIS — R252 Cramp and spasm: Secondary | ICD-10-CM

## 2013-12-04 MED ORDER — GADOBENATE DIMEGLUMINE 529 MG/ML IV SOLN: 10.0000 mL | Freq: Once | INTRAVENOUS | Status: AC | PRN

## 2013-12-07 ENCOUNTER — Encounter: Payer: Self-pay | Admitting: Diagnostic Neuroimaging

## 2013-12-07 ENCOUNTER — Ambulatory Visit (INDEPENDENT_AMBULATORY_CARE_PROVIDER_SITE_OTHER): Payer: Medicare Other | Admitting: Diagnostic Neuroimaging

## 2013-12-07 VITALS — BP 123/84 | HR 111 | Temp 98.2°F | Ht 61.5 in | Wt 113.2 lb

## 2013-12-07 DIAGNOSIS — G959 Disease of spinal cord, unspecified: Secondary | ICD-10-CM | POA: Diagnosis not present

## 2013-12-07 DIAGNOSIS — M4714 Other spondylosis with myelopathy, thoracic region: Secondary | ICD-10-CM

## 2013-12-07 NOTE — Progress Notes (Signed)
[facility]  PATIENT: Amy Bray DOB: 1945/08/07  REFERRING CLINICIAN: Donnie Mesa HISTORY FROM: patient and husband  REASON FOR VISIT: follow up   HISTORICAL  CHIEF COMPLAINT:  Chief Complaint  Patient presents with  . Follow-up    HISTORY OF PRESENT ILLNESS:   UPDATE 12/07/13: Patient is stable with symptoms. MRI scans reviewed. Continues to have weakness in legs and balance difficulty.  UPDATE 11/21/13: 68 year old right-handed female here for evaluation of lower extremity spasms and weakness. 2002 patient developed onset of right leg stiffness, spasm, weakness. Patient was diagnosed with cervical spine disease, bone spur enriching, and treated with anterior cervical discectomy and fusion in 2003. Following this her right leg weakness and spasms significantly improved. However one year later her symptoms return. Patient followed up with several surgeons and ultimately had a second cervical spine surgery in 2006. Following this patient had slight improvement of right leg however within several months her weakness, spasms and gait continued to decline. Patient transitioned needing a cane to walk. 2013 patient was evaluated by a neurologist in [place], to evaluate her right leg weakness. Apparently patient had a "multiple sclerosis" workup including MRI of the brain and extensive blood testing. Patient also had EMG nerve conduction studies looking for peroneal neuropathy but apparently all studies were normal. MRI of the brain did show a right medulla lesion which was reported as a stroke. Over the past 1 year patient has developed new onset left leg weakness, spasm, stiffness. Patient followed up with orthopedic surgery who then referred patient to me for evaluation. Last MRI of cervical spine was in 2006 at [facility]. No bowel or bladder incontinence. No significant numbness or tingling. No weakness in upper extremities. No vision loss, vision changes, slurred speech or  trouble talking.   REVIEW OF SYSTEMS: Full 14 system review of systems performed and notable only for weakness.  ALLERGIES: No Known Allergies  HOME MEDICATIONS: Outpatient Prescriptions Prior to Visit  Medication Sig Dispense Refill  . alendronate (FOSAMAX) 70 MG tablet Take 1 tablet by mouth once a week.    Marland Kitchen lisinopril (PRINIVIL,ZESTRIL) 10 MG tablet Take 1 tablet by mouth daily.    . nortriptyline (PAMELOR) 50 MG capsule Take 1 capsule by mouth daily.    Marland Kitchen tiZANidine (ZANAFLEX) 4 MG tablet Take 1 tablet by mouth every 6 (six) hours as needed.    . traMADol-acetaminophen (ULTRACET) 37.5-325 MG per tablet Take 1 tablet by mouth daily as needed.     No facility-administered medications prior to visit.    PAST MEDICAL HISTORY: History reviewed. No pertinent past medical history.  PAST SURGICAL HISTORY: Past Surgical History  Procedure Laterality Date  . Cervical fusion  01/2001, 01/2004    C3-C6    FAMILY HISTORY: Family History  Problem Relation Age of Onset  . Transient ischemic attack Mother   . Pancreatic cancer Father     SOCIAL HISTORY:  History   Social History  . Marital Status: Married    Spouse Name: Shemekia Patane    Number of Children: 0  . Years of Education: College   Occupational History  .  Other    n/a   Social History Main Topics  . Smoking status: Former Smoker -- 1.00 packs/day for 15 years    Types: Cigarettes    Quit date: 01/21/1983  . Smokeless tobacco: Never Used  . Alcohol Use: 0.0 oz/week    0 Not specified per week     Comment: occasionally- wine  .  Drug Use: No  . Sexual Activity: Not on file   Other Topics Concern  . Not on file   Social History Narrative   Patient lives at home with her spouse.   Caffeine Use-none     PHYSICAL EXAM  Filed Vitals:   12/07/13 1440  BP: 123/84  Pulse: 111  Temp: 98.2 F (36.8 C)  TempSrc: Oral  Height: 5' 1.5" (1.562 m)  Weight: 113 lb 3.2 oz (51.347 kg)    Not recorded      No exam data present   Body mass index is 21.05 kg/(m^2).  GENERAL EXAM: Patient is in no distress; well developed, nourished and groomed; neck is supple  CARDIOVASCULAR: Regular rate and rhythm, no murmurs, no carotid bruits  NEUROLOGIC: MENTAL STATUS: awake, alert, language fluent, comprehension intact, naming intact, fund of knowledge appropriate CRANIAL NERVE: no papilledema on fundoscopic exam, pupils equal and reactive to light, visual fields full to confrontation, extraocular muscles intact, no nystagmus, facial sensation and strength symmetric, hearing intact, palate elevates symmetrically, uvula midline, shoulder shrug symmetric, tongue midline. MOTOR: normal bulk and tone in BUE; SIG INCREASED EXT TONE IN BLE; BLE (HF 4, KE 4, KF 3, DF 4) SENSORY: DECR VIB AT TOES (RIGHT 8 SEC, LEFT 6 SEC); NORMAL PP, TEMP COORDINATION: finger-nose-finger, fine finger movements normal REFLEXES: BUE 3 WITH SPREAD; BLE (KNEES 4, ANKLES 2); POSITIVE HOFFMANS ON LEFT; UPGOING TOES BILATERALLY GAIT/STATION: SPASTIC, PARAPARETIC GAIT; UNSTEADY WITH SINGLE POINT CANE     DIAGNOSTIC DATA (LABS, IMAGING, TESTING) - I reviewed patient records, labs, notes, testing and imaging myself where available.  No results found for: WBC, HGB, HCT, MCV, PLT No results found for: NA, K, CL, CO2, GLUCOSE, BUN, CREATININE, CALCIUM, PROT, ALBUMIN, AST, ALT, ALKPHOS, BILITOT, GFRNONAA, GFRAA No results found for: CHOL, HDL, LDLCALC, LDLDIRECT, TRIG, CHOLHDL No results found for: HGBA1C No results found for: VITAMINB12 No results found for: TSH   I reviewed images myself. In my review, I think there is a 2cm spinal cord lesion (right side, C6-C7) and a smaller left T1 lesion, suspicious for demyelinating disease, on the MRI cervical spine from 10/08/03. No adjacent disc or bone degeneration or spinal stenosis. The MRI report states that the cord appears normal. -VRP  10/08/03 MRI cervical spine (images reviewed)  - Satisfactory anterior plate fusion of C4 and C5.Disk degeneration and spondylosis at C3-4 and C5-6 with biforaminal narrowing but no significant central canal stenosis. There is no disk protrusion.   11/06/11 MRI brain (report only) - "subtle area of increased T2 intensity in the right posterior upper medulla likely representing old infarct or possibly area of previous inflammation. Otherwise normal MRI of the brain."  12/04/13 MRI brain (with and without) demonstrating: 1. Few punctate foci of right frontal subcortical and periventricular non-specific gliosis. These findings are non-specific and considerations include autoimmune, inflammatory, post-infectious, microvascular ischemic or migraine associated etiologies.  2. No abnormal enhancing lesions. No acute findings.  12/04/13 MRI cervical spine (with and without) demonstrating: 1. The spinal cord is notable for 3 subtle T2 hyperintense lesions: C5-6 on the left, C7 on the right, T2 slightly on the right of midline. No abnormal enhancing lesions. Considerations include autoimmune, inflammatory or post-infectious etiologies. No adjacent disc disease to suggest mechanical compressive etiologies.  2. At C6-7: left uncovertebral joint hypertrophy with moderate left foraminal stenosis. 3. Compared to prior MRI on 10/08/03, the C5-6 lesion appears new; the C7 and T2 spinal cord lesions appear stable. Also, there has been  change in ACDF levels (previously from C4-C5; now C3-C6).   12/04/13 MRI thoracic spine (with and without) - unremarkable    ASSESSMENT AND PLAN  68 y.o. year old female here with bilateral lower extremity spasticity, weakness,with brisk reflexes in upper and lower extremities, left Hoffman sign, bilateral upgoing toes. MRIs from 2005 and 2015 demonstrate multiple spinal cord lesions. Now suspect demyelinating disease, with first attack in 2002, second attack in 2003, and 3rd attack in 2014.  PLAN: - labs and LP - follow up  in 1 month after testing  Orders Placed This Encounter  Procedures  . DG FLUORO GUIDE LUMBAR PUNCTURE  . NMO IgG Autoantibodies  . ANA w/Reflex if Positive  . Pan-ANCA  . Angiotensin converting enzyme  . HTLV-I/II Antibodies, Qual.  . Vitamin B12  . Ceruloplasmin  . Copper, Serum  . RPR, Rfx Qn RPR/Confirm TP  . HIV antibody (with reflex)   Return in about 1 month (around 01/06/2014).    Penni Bombard, MD 62/03/5595, 4:16 PM Certified in Neurology, Neurophysiology and Neuroimaging  [facility] [address], [place], [place] [address] [phone]

## 2013-12-23 DIAGNOSIS — R350 Frequency of micturition: Secondary | ICD-10-CM | POA: Diagnosis not present

## 2013-12-23 DIAGNOSIS — N3941 Urge incontinence: Secondary | ICD-10-CM | POA: Diagnosis not present

## 2013-12-23 DIAGNOSIS — R3915 Urgency of urination: Secondary | ICD-10-CM | POA: Diagnosis not present

## 2013-12-29 ENCOUNTER — Ambulatory Visit
Admission: RE | Admit: 2013-12-29 | Discharge: 2013-12-29 | Disposition: A | Discharge status: Home / Self Care | Payer: Medicare Other | Source: Ambulatory Visit | Attending: Diagnostic Neuroimaging | Admitting: Diagnostic Neuroimaging

## 2013-12-29 ENCOUNTER — Other Ambulatory Visit: Payer: Self-pay | Admitting: Diagnostic Neuroimaging

## 2013-12-29 DIAGNOSIS — M21379 Foot drop, unspecified foot: Secondary | ICD-10-CM | POA: Diagnosis not present

## 2013-12-29 DIAGNOSIS — G959 Disease of spinal cord, unspecified: Secondary | ICD-10-CM | POA: Diagnosis not present

## 2013-12-29 DIAGNOSIS — M4714 Other spondylosis with myelopathy, thoracic region: Secondary | ICD-10-CM | POA: Diagnosis not present

## 2013-12-29 LAB — CSF CELL COUNT WITH DIFFERENTIAL
RBC Count, CSF: 0 cu mm
Tube #: 4
WBC, CSF: 1 cu mm (ref 0–5)

## 2013-12-29 LAB — GRAM STAIN: Gram Stain: NONE SEEN

## 2013-12-29 LAB — PROTEIN, CSF: Total Protein, CSF: 26 mg/dL (ref 15–45)

## 2013-12-29 LAB — GLUCOSE, CSF: Glucose, CSF: 59 mg/dL (ref 43–76)

## 2013-12-29 NOTE — Progress Notes (Signed)
Blood drawn from left AC to go with spinal fluid. Pt tolerated well, 2 SST tubes drawn and site unremarkable.  Discharge instructions explained to pt.

## 2013-12-29 NOTE — Discharge Instructions (Signed)

## 2013-12-31 LAB — CNS IGG SYNTHESIS RATE, CSF+BLOOD
Albumin, CSF: 12.3 mg/dL (ref 8.0–42.0)
Albumin, Serum(Neph): 4.3 g/dL (ref 3.2–4.6)
IgG Index, CSF: 0.54 (ref <0.66)
IgG, CSF: 0.9 mg/dL (ref 0.8–7.7)
IgG, Serum: 580 mg/dL — ABNORMAL LOW (ref 694–1618)
MS CNS IgG Synthesis Rate: -1.5 mg/24 h (ref <=3.3)

## 2014-01-01 LAB — OLIGOCLONAL BANDS, CSF + SERM

## 2014-01-09 ENCOUNTER — Ambulatory Visit (INDEPENDENT_AMBULATORY_CARE_PROVIDER_SITE_OTHER): Payer: Medicare Other | Admitting: Diagnostic Neuroimaging

## 2014-01-09 ENCOUNTER — Encounter: Payer: Self-pay | Admitting: Diagnostic Neuroimaging

## 2014-01-09 VITALS — BP 125/81 | HR 101 | Ht 61.5 in | Wt 113.0 lb

## 2014-01-09 DIAGNOSIS — M4712 Other spondylosis with myelopathy, cervical region: Secondary | ICD-10-CM | POA: Diagnosis not present

## 2014-01-09 DIAGNOSIS — M4714 Other spondylosis with myelopathy, thoracic region: Secondary | ICD-10-CM

## 2014-01-09 DIAGNOSIS — G35 Multiple sclerosis: Secondary | ICD-10-CM | POA: Diagnosis not present

## 2014-01-09 DIAGNOSIS — G959 Disease of spinal cord, unspecified: Secondary | ICD-10-CM | POA: Diagnosis not present

## 2014-01-09 NOTE — Progress Notes (Signed)
[facility]  PATIENT: Amy Bray DOB: March 27, 1945  REFERRING CLINICIAN: Donnie Mesa HISTORY FROM: patient and husband  REASON FOR VISIT: follow up   HISTORICAL  CHIEF COMPLAINT:  Chief Complaint  Patient presents with  . Follow-up    RM 6  . Cervical myelopathy    HISTORY OF PRESENT ILLNESS:   UPDATE 01/09/14: since last visit, no new events symptoms. LP results reviewed. Didn't get lab work at last visit for MS mimics.   UPDATE 12/07/13: Patient is stable with symptoms. MRI scans reviewed. Continues to have weakness in legs and balance difficulty.  UPDATE 11/21/13: 68 year old right-handed female here for evaluation of lower extremity spasms and weakness. 2002 patient developed onset of right leg stiffness, spasm, weakness. Patient was diagnosed with cervical spine disease, bone spur enriching, and treated with anterior cervical discectomy and fusion in 2003. Following this her right leg weakness and spasms significantly improved. However one year later her symptoms return. Patient followed up with several surgeons and ultimately had a second cervical spine surgery in 2006. Following this patient had slight improvement of right leg however within several months her weakness, spasms and gait continued to decline. Patient transitioned needing a cane to walk. 2013 patient was evaluated by a neurologist in [place], to evaluate her right leg weakness. Apparently patient had a "multiple sclerosis" workup including MRI of the brain and extensive blood testing. Patient also had EMG nerve conduction studies looking for peroneal neuropathy but apparently all studies were normal. MRI of the brain did show a right medulla lesion which was reported as a stroke. Over the past 1 year patient has developed new onset left leg weakness, spasm, stiffness. Patient followed up with orthopedic surgery who then referred patient to me for evaluation. Last MRI of cervical spine was in  2006 at [facility]. No bowel or bladder incontinence. No significant numbness or tingling. No weakness in upper extremities. No vision loss, vision changes, slurred speech or trouble talking.   REVIEW OF SYSTEMS: Full 14 system review of systems performed and notable only for weakness.  ALLERGIES: No Known Allergies  HOME MEDICATIONS: Outpatient Prescriptions Prior to Visit  Medication Sig Dispense Refill  . alendronate (FOSAMAX) 70 MG tablet Take 1 tablet by mouth once a week.    Marland Kitchen lisinopril (PRINIVIL,ZESTRIL) 10 MG tablet Take 1 tablet by mouth daily.    . nortriptyline (PAMELOR) 50 MG capsule Take 1 capsule by mouth daily.    Marland Kitchen tiZANidine (ZANAFLEX) 4 MG tablet Take 1 tablet by mouth every 6 (six) hours as needed.    . traMADol-acetaminophen (ULTRACET) 37.5-325 MG per tablet Take 1 tablet by mouth daily as needed.     No facility-administered medications prior to visit.    PAST MEDICAL HISTORY: Past Medical History  Diagnosis Date  . Cervical myelopathy     PAST SURGICAL HISTORY: Past Surgical History  Procedure Laterality Date  . Cervical fusion  01/2001, 01/2004    C3-C6    FAMILY HISTORY: Family History  Problem Relation Age of Onset  . Transient ischemic attack Mother   . Pancreatic cancer Father     SOCIAL HISTORY:  History   Social History  . Marital Status: Married    Spouse Name: Celestine Prim    Number of Children: 0  . Years of Education: College   Occupational History  .  Other    n/a   Social History Main Topics  . Smoking status: Former Smoker -- 1.00 packs/day for 15  years    Types: Cigarettes    Quit date: 01/21/1983  . Smokeless tobacco: Never Used  . Alcohol Use: 0.0 oz/week    0 Not specified per week     Comment: occasionally- wine  . Drug Use: No  . Sexual Activity: Not on file   Other Topics Concern  . Not on file   Social History Narrative   Patient lives at home with her spouse.   Caffeine Use-none     PHYSICAL EXAM  Filed  Vitals:   01/09/14 1328  BP: 125/81  Pulse: 101  Height: 5' 1.5" (1.562 m)  Weight: 113 lb (51.256 kg)    Not recorded     No exam data present   Body mass index is 21.01 kg/(m^2).  GENERAL EXAM: Patient is in no distress; well developed, nourished and groomed; neck is supple  CARDIOVASCULAR: Regular rate and rhythm, no murmurs, no carotid bruits  NEUROLOGIC: MENTAL STATUS: awake, alert, language fluent, comprehension intact, naming intact, fund of knowledge appropriate CRANIAL NERVE: no papilledema on fundoscopic exam, pupils equal and reactive to light, visual fields full to confrontation, extraocular muscles intact, no nystagmus, facial sensation and strength symmetric, hearing intact, palate elevates symmetrically, uvula midline, shoulder shrug symmetric, tongue midline. MOTOR: normal bulk and tone in BUE; SIG INCREASED EXT TONE IN BLE; BLE (HF 4, KE 4, KF 3, DF 4) SENSORY: DECR VIB AT TOES (RIGHT 8 SEC, LEFT 6 SEC); NORMAL PP, TEMP COORDINATION: finger-nose-finger, fine finger movements normal REFLEXES: BUE 3 WITH SPREAD; BLE (KNEES 4, ANKLES 2); POSITIVE HOFFMANS ON LEFT; UPGOING TOES BILATERALLY GAIT/STATION: SPASTIC, PARAPARETIC GAIT; UNSTEADY WITH SINGLE POINT CANE     DIAGNOSTIC DATA (LABS, IMAGING, TESTING) - I reviewed patient records, labs, notes, testing and imaging myself where available.  No results found for: WBC, HGB, HCT, MCV, PLT No results found for: NA, K, CL, CO2, GLUCOSE, BUN, CREATININE, CALCIUM, PROT, ALBUMIN, AST, ALT, ALKPHOS, BILITOT, GFRNONAA, GFRAA No results found for: CHOL, HDL, LDLCALC, LDLDIRECT, TRIG, CHOLHDL No results found for: HGBA1C No results found for: VITAMINB12 No results found for: TSH   I reviewed images myself. In my review, I think there is a 2cm spinal cord lesion (right side, C6-C7) and a smaller left T1 lesion, suspicious for demyelinating disease, on the MRI cervical spine from 10/08/03. No adjacent disc or bone  degeneration or spinal stenosis. The MRI report states that the cord appears normal. -VRP  10/08/03 MRI cervical spine (images reviewed) - Satisfactory anterior plate fusion of C4 and C5.Disk degeneration and spondylosis at C3-4 and C5-6 with biforaminal narrowing but no significant central canal stenosis. There is no disk protrusion.   11/06/11 MRI brain (report only) - "subtle area of increased T2 intensity in the right posterior upper medulla likely representing old infarct or possibly area of previous inflammation. Otherwise normal MRI of the brain."  12/04/13 MRI brain (with and without) demonstrating: 1. Few punctate foci of right frontal subcortical and periventricular non-specific gliosis. These findings are non-specific and considerations include autoimmune, inflammatory, post-infectious, microvascular ischemic or migraine associated etiologies.  2. No abnormal enhancing lesions. No acute findings.  12/04/13 MRI cervical spine (with and without) demonstrating: 1. The spinal cord is notable for 3 subtle T2 hyperintense lesions: C5-6 on the left, C7 on the right, T2 slightly on the right of midline. No abnormal enhancing lesions. Considerations include autoimmune, inflammatory or post-infectious etiologies. No adjacent disc disease to suggest mechanical compressive etiologies.  2. At C6-7: left uncovertebral joint hypertrophy  with moderate left foraminal stenosis. 3. Compared to prior MRI on 10/08/03, the C5-6 lesion appears new; the C7 and T2 spinal cord lesions appear stable. Also, there has been change in ACDF levels (previously from C4-C5; now C3-C6).   12/04/13 MRI thoracic spine (with and without) - unremarkable  12/29/13 LP - WBC 1, RBC 0, protein 26, glucose 59, OCB > 5, gram stain neg    ASSESSMENT AND PLAN  68 y.o. year old female here with bilateral lower extremity spasticity, weakness,with brisk reflexes in upper and lower extremities, left Hoffman sign, bilateral upgoing  toes. MRIs from 2005 and 2015 demonstrate multiple spinal cord lesions. Now suspect demyelinating disease, with first attack in 2002, second attack in 2003, and 3rd attack in 2014.  PLAN: - labs (JCV, vit D, and other tests ordered at last visit) - then tysabri vs gilenya/tecfidera - follow up in 6 weeks  Orders Placed This Encounter  Procedures  . Stratify JCV Antibody Test ([facility])  . Vit D  25 hydroxy (rtn osteoporosis monitoring)   Return in about 6 weeks (around 02/20/2014).    Penni Bombard, MD 38/32/9191, 6:60 PM Certified in Neurology, Neurophysiology and Neuroimaging  [facility] [address], [place], [place] [address] [phone]

## 2014-01-09 NOTE — Addendum Note (Signed)
Addended by: Margorie John on: 01/09/2014 02:36 PM   Modules accepted: Orders

## 2014-01-09 NOTE — Patient Instructions (Signed)
I will check additional testing. 

## 2014-01-10 LAB — VITAMIN D 25 HYDROXY (VIT D DEFICIENCY, FRACTURES): Vit D, 25-Hydroxy: 48.6 ng/mL (ref 30.0–100.0)

## 2014-01-10 LAB — HTLV-I/II ANTIBODIES, QUAL.: HTLV I/II Ab: NEGATIVE

## 2014-01-23 ENCOUNTER — Telehealth: Payer: Self-pay | Admitting: Diagnostic Neuroimaging

## 2014-01-23 NOTE — Telephone Encounter (Signed)
Spoke to patient. Advised will contact [facility] in the a.m to get JCV results. Patient states she has not heard anything about rehab either. Advised will follow up with MD and call tomorrow. Patient agreed.

## 2014-01-23 NOTE — Telephone Encounter (Signed)
Patient is calling because she was seen 01-08-14 and was to start on a new medication after bloodwork but patient has not heard from our office. Please call patient and advise.  Thank you,.

## 2014-01-26 NOTE — Telephone Encounter (Signed)
Left vmail. JCV results are still processing per [facility].

## 2014-01-27 DIAGNOSIS — N3941 Urge incontinence: Secondary | ICD-10-CM | POA: Diagnosis not present

## 2014-01-30 DIAGNOSIS — L821 Other seborrheic keratosis: Secondary | ICD-10-CM | POA: Diagnosis not present

## 2014-01-30 DIAGNOSIS — D225 Melanocytic nevi of trunk: Secondary | ICD-10-CM | POA: Diagnosis not present

## 2014-01-30 DIAGNOSIS — L814 Other melanin hyperpigmentation: Secondary | ICD-10-CM | POA: Diagnosis not present

## 2014-01-31 DIAGNOSIS — M62838 Other muscle spasm: Secondary | ICD-10-CM | POA: Diagnosis not present

## 2014-01-31 DIAGNOSIS — Z79899 Other long term (current) drug therapy: Secondary | ICD-10-CM | POA: Diagnosis not present

## 2014-01-31 DIAGNOSIS — M533 Sacrococcygeal disorders, not elsewhere classified: Secondary | ICD-10-CM | POA: Diagnosis not present

## 2014-02-06 ENCOUNTER — Telehealth: Payer: Self-pay | Admitting: Diagnostic Neuroimaging

## 2014-02-06 NOTE — Telephone Encounter (Signed)
Patient is calling to get the results of her blood work of 12/21. She is seeing referrel Dr at [facility] this week.  Please call.

## 2014-02-08 DIAGNOSIS — G35 Multiple sclerosis: Secondary | ICD-10-CM | POA: Diagnosis not present

## 2014-02-08 DIAGNOSIS — M62838 Other muscle spasm: Secondary | ICD-10-CM | POA: Diagnosis not present

## 2014-02-16 NOTE — Telephone Encounter (Signed)
I called patient. JCV ab still pending. She is interested in [place] or [place]. She will come in on Monday to follow up and discuss further, and sign start form.   Penni Bombard, MD 3/50/0938, 1:82 PM Certified in Neurology, Neurophysiology and Neuroimaging  [facility] [address] [place], [place] [address] [phone]

## 2014-02-20 ENCOUNTER — Ambulatory Visit (INDEPENDENT_AMBULATORY_CARE_PROVIDER_SITE_OTHER): Payer: Medicare Other | Admitting: Diagnostic Neuroimaging

## 2014-02-20 ENCOUNTER — Encounter: Payer: Self-pay | Admitting: Diagnostic Neuroimaging

## 2014-02-20 VITALS — BP 131/85 | HR 105 | Temp 98.1°F | Ht 61.0 in | Wt 117.6 lb

## 2014-02-20 DIAGNOSIS — G35 Multiple sclerosis: Secondary | ICD-10-CM | POA: Diagnosis not present

## 2014-02-20 NOTE — Progress Notes (Signed)
[facility]  PATIENT: Amy Bray DOB: [birth year]  REFERRING CLINICIAN: Donnie Mesa HISTORY FROM: patient and husband  REASON FOR VISIT: follow up   HISTORICAL  CHIEF COMPLAINT:  Chief Complaint  Patient presents with  . Follow-up    Multiple sclerosis    HISTORY OF PRESENT ILLNESS:   UPDATE 02/20/14: Since last visit, had reviewed lab results and now ready to start MS medication. Now would like to start tysabri.  UPDATE 01/09/14: since last visit, no new events symptoms. LP results reviewed. Didn't get lab work at last visit for MS mimics.   UPDATE 12/07/13: Patient is stable with symptoms. MRI scans reviewed. Continues to have weakness in legs and balance difficulty.  UPDATE 11/21/13: 69 year old right-handed female here for evaluation of lower extremity spasms and weakness. 2002 patient developed onset of right leg stiffness, spasm, weakness. Patient was diagnosed with cervical spine disease, bone spur ridging, and treated with anterior cervical discectomy and fusion in 2003. Following this her right leg weakness and spasms significantly improved. However one year later her symptoms return. Patient followed up with several surgeons and ultimately had a second cervical spine surgery in 2006. Following this patient had slight improvement of right leg however within several months her weakness, spasms and gait continued to decline. Patient transitioned needing a cane to walk. 2013 patient was evaluated by a neurologist in [place], to evaluate her right leg weakness. Apparently patient had a "multiple sclerosis" workup including MRI of the brain and extensive blood testing. Patient also had EMG nerve conduction studies looking for peroneal neuropathy but apparently all studies were normal. MRI of the brain did show a right medulla lesion which was reported as a stroke. Over the past 1 year patient has developed new onset left leg weakness, spasm, stiffness. Patient  followed up with orthopedic surgery who then referred patient to me for evaluation. Last MRI of cervical spine was in 2006 at [facility]. No bowel or bladder incontinence. No significant numbness or tingling. No weakness in upper extremities. No vision loss, vision changes, slurred speech or trouble talking.   REVIEW OF SYSTEMS: Full 14 system review of systems performed and notable only for weakness.  ALLERGIES: No Known Allergies  HOME MEDICATIONS: Outpatient Prescriptions Prior to Visit  Medication Sig Dispense Refill  . alendronate (FOSAMAX) 70 MG tablet Take 1 tablet by mouth once a week.    Marland Kitchen lisinopril (PRINIVIL,ZESTRIL) 10 MG tablet Take 1 tablet by mouth daily.    . nortriptyline (PAMELOR) 50 MG capsule Take 1 capsule by mouth daily.    Marland Kitchen tiZANidine (ZANAFLEX) 4 MG tablet Take 1 tablet by mouth every 6 (six) hours as needed.    . traMADol-acetaminophen (ULTRACET) 37.5-325 MG per tablet Take 1 tablet by mouth daily as needed.     No facility-administered medications prior to visit.    PAST MEDICAL HISTORY: Past Medical History  Diagnosis Date  . Cervical myelopathy     PAST SURGICAL HISTORY: Past Surgical History  Procedure Laterality Date  . Cervical fusion  01/2001, 01/2004    C3-C6    FAMILY HISTORY: Family History  Problem Relation Age of Onset  . Transient ischemic attack Mother   . Pancreatic cancer Father     SOCIAL HISTORY:  History   Social History  . Marital Status: Married    Spouse Name: Vinessa Macconnell    Number of Children: 0  . Years of Education: College   Occupational History  .  Other  n/a   Social History Main Topics  . Smoking status: Former Smoker -- 1.00 packs/day for 15 years    Types: Cigarettes    Quit date: 01/21/1983  . Smokeless tobacco: Never Used  . Alcohol Use: 0.0 oz/week    0 Not specified per week     Comment: occasionally- wine  . Drug Use: No  . Sexual Activity: Not on file   Other Topics Concern  . Not on file    Social History Narrative   Patient lives at home with her spouse.   Caffeine Use-none     PHYSICAL EXAM  Filed Vitals:   02/20/14 1409  BP: 131/85  Pulse: 105  Temp: 98.1 F (36.7 C)  TempSrc: Oral  Height: 5\' 1"  (1.549 m)  Weight: 117 lb 9.6 oz (53.343 kg)    Not recorded     No exam data present   Body mass index is 22.23 kg/(m^2).  GENERAL EXAM: Patient is in no distress; well developed, nourished and groomed; neck is supple  CARDIOVASCULAR: Regular rate and rhythm, no murmurs, no carotid bruits  NEUROLOGIC: MENTAL STATUS: awake, alert, language fluent, comprehension intact, naming intact, fund of knowledge appropriate CRANIAL NERVE: pupils equal and reactive to light, visual fields full to confrontation, extraocular muscles intact, no nystagmus, facial sensation and strength symmetric, hearing intact, palate elevates symmetrically, uvula midline, shoulder shrug symmetric, tongue midline. MOTOR: normal bulk and tone in BUE; SIG INCREASED EXT TONE IN BLE; BLE (HF 3-4, KE 4, KF 3, DF 3) SENSORY: DECR VIB AT TOES (RIGHT 8 SEC, LEFT 6 SEC); NORMAL PP, TEMP COORDINATION: finger-nose-finger, fine finger movements normal REFLEXES: BUE 3 WITH SPREAD; BLE (KNEES 4, ANKLES 2); POSITIVE HOFFMANS ON LEFT; UPGOING TOES BILATERALLY GAIT/STATION: SPASTIC, PARAPARETIC GAIT; UNSTEADY WITH SINGLE POINT CANE     DIAGNOSTIC DATA (LABS, IMAGING, TESTING) - I reviewed patient records, labs, notes, testing and imaging myself where available.  No results found for: WBC, HGB, HCT, MCV, PLT No results found for: NA, K, CL, CO2, GLUCOSE, BUN, CREATININE, CALCIUM, PROT, ALBUMIN, AST, ALT, ALKPHOS, BILITOT, GFRNONAA, GFRAA No results found for: CHOL, HDL, LDLCALC, LDLDIRECT, TRIG, CHOLHDL No results found for: HGBA1C No results found for: VITAMINB12 No results found for: TSH  VIT D, 25-HYDROXY  Date Value Ref Range Status  01/09/2014 48.6 30.0 - 100.0 ng/mL Final    Comment:     Vitamin D deficiency has been defined by the [place] and an Endocrine Society practice guideline as a level of serum 25-OH vitamin D less than 20 ng/mL (1,2). The Endocrine Society went on to further define vitamin D insufficiency as a level between 21 and 29 ng/mL (2). 1. IOM (Institute of Medicine). 2010. Dietary reference    intakes for calcium and D. [place]: The    [employer]. 2. Holick MF, Binkley [place], Bischoff-Ferrari HA, et al.    Evaluation, treatment, and prevention of vitamin D    deficiency: an Endocrine Society clinical practice    guideline. JCEM. 2011 Jul; 96(7):1911-30.     I reviewed images myself. In my review, I think there is a 2cm spinal cord lesion (right side, C6-C7) and a smaller left T1 lesion, suspicious for demyelinating disease, on the MRI cervical spine from 10/08/03. No adjacent disc or bone degeneration or spinal stenosis. The MRI report states that the cord appears normal. -VRP  10/08/03 MRI cervical spine (images reviewed) - Satisfactory anterior plate fusion of C4 and C5.Disk degeneration and spondylosis at  C3-4 and C5-6 with biforaminal narrowing but no significant central canal stenosis. There is no disk protrusion.   11/06/11 MRI brain (report only) - "subtle area of increased T2 intensity in the right posterior upper medulla likely representing old infarct or possibly area of previous inflammation. Otherwise normal MRI of the brain."  12/04/13 MRI brain (with and without) demonstrating: 1. Few punctate foci of right frontal subcortical and periventricular non-specific gliosis. These findings are non-specific and considerations include autoimmune, inflammatory, post-infectious, microvascular ischemic or migraine associated etiologies.  2. No abnormal enhancing lesions. No acute findings.  12/04/13 MRI cervical spine (with and without) demonstrating: 1. The spinal cord is notable for 3 subtle T2 hyperintense lesions: C5-6  on the left, C7 on the right, T2 slightly on the right of midline. No abnormal enhancing lesions. Considerations include autoimmune, inflammatory or post-infectious etiologies. No adjacent disc disease to suggest mechanical compressive etiologies.  2. At C6-7: left uncovertebral joint hypertrophy with moderate left foraminal stenosis. 3. Compared to prior MRI on 10/08/03, the C5-6 lesion appears new; the C7 and T2 spinal cord lesions appear stable. Also, there has been change in ACDF levels (previously from C4-C5; now C3-C6).   12/04/13 MRI thoracic spine (with and without) - unremarkable  12/29/13 LP - WBC 1, RBC 0, protein 26, glucose 59, OCB > 5, gram stain neg  01/10/14 anti-JCV ab - 0.10  Negative    ASSESSMENT AND PLAN  69 y.o. year old female here with bilateral lower extremity spasticity, weakness,with brisk reflexes in upper and lower extremities, left Hoffman sign, bilateral upgoing toes. MRIs from 2005 and 2015 demonstrate multiple spinal cord lesions. Now suspect demyelinating disease, with first attack in 2002, second attack in 2003, and 3rd attack in 2014.  Dx: multiple sclerosis   PLAN: - start process for tysabri - repeat MRI brain and JCV ab q6 months - encouraged patient to use rollator walker  Return in about 3 months (around 05/21/2014).    Penni Bombard, MD 2/0/3559, 7:41 PM Certified in Neurology, Neurophysiology and Neuroimaging  [facility] [address] [place], [place] [address] [phone]

## 2014-02-20 NOTE — Patient Instructions (Signed)
Will plan to start tysabri.

## 2014-02-27 DIAGNOSIS — M5416 Radiculopathy, lumbar region: Secondary | ICD-10-CM | POA: Diagnosis not present

## 2014-02-27 DIAGNOSIS — M533 Sacrococcygeal disorders, not elsewhere classified: Secondary | ICD-10-CM | POA: Diagnosis not present

## 2014-03-03 DIAGNOSIS — N3941 Urge incontinence: Secondary | ICD-10-CM | POA: Diagnosis not present

## 2014-03-03 DIAGNOSIS — R3915 Urgency of urination: Secondary | ICD-10-CM | POA: Diagnosis not present

## 2014-03-03 DIAGNOSIS — R350 Frequency of micturition: Secondary | ICD-10-CM | POA: Diagnosis not present

## 2014-03-09 ENCOUNTER — Telehealth: Payer: Self-pay | Admitting: Diagnostic Neuroimaging

## 2014-03-09 NOTE — Telephone Encounter (Signed)
Patient stated Amy Bray is waiting for our office to schedule first Tysabri IV Injection.  Please call and advise.

## 2014-03-10 ENCOUNTER — Telehealth: Payer: Self-pay | Admitting: *Deleted

## 2014-03-10 ENCOUNTER — Other Ambulatory Visit: Payer: Self-pay | Admitting: *Deleted

## 2014-03-10 DIAGNOSIS — G35 Multiple sclerosis: Secondary | ICD-10-CM

## 2014-03-10 NOTE — Telephone Encounter (Signed)
Called and spoke with the pt to inform her that her first Tysabri appt was at [facility] on Monday March 21st at 11 am. She stated an understanding and a thanks.

## 2014-03-14 DIAGNOSIS — N319 Neuromuscular dysfunction of bladder, unspecified: Secondary | ICD-10-CM | POA: Diagnosis not present

## 2014-03-14 DIAGNOSIS — N3000 Acute cystitis without hematuria: Secondary | ICD-10-CM | POA: Diagnosis not present

## 2014-04-07 DIAGNOSIS — N3941 Urge incontinence: Secondary | ICD-10-CM | POA: Diagnosis not present

## 2014-04-10 ENCOUNTER — Encounter (HOSPITAL_COMMUNITY): Admission: RE | Admit: 2014-04-10 | Payer: Medicare Other | Source: Ambulatory Visit

## 2014-04-10 DIAGNOSIS — G35 Multiple sclerosis: Secondary | ICD-10-CM | POA: Diagnosis not present

## 2014-04-28 DIAGNOSIS — Z79899 Other long term (current) drug therapy: Secondary | ICD-10-CM | POA: Diagnosis not present

## 2014-04-28 DIAGNOSIS — M533 Sacrococcygeal disorders, not elsewhere classified: Secondary | ICD-10-CM | POA: Diagnosis not present

## 2014-04-28 DIAGNOSIS — Z5181 Encounter for therapeutic drug level monitoring: Secondary | ICD-10-CM | POA: Diagnosis not present

## 2014-05-08 DIAGNOSIS — G35 Multiple sclerosis: Secondary | ICD-10-CM | POA: Diagnosis not present

## 2014-05-12 DIAGNOSIS — N3941 Urge incontinence: Secondary | ICD-10-CM | POA: Diagnosis not present

## 2014-05-22 ENCOUNTER — Ambulatory Visit (INDEPENDENT_AMBULATORY_CARE_PROVIDER_SITE_OTHER): Payer: Medicare Other | Admitting: Diagnostic Neuroimaging

## 2014-05-22 ENCOUNTER — Encounter: Payer: Self-pay | Admitting: Diagnostic Neuroimaging

## 2014-05-22 VITALS — BP 106/73 | HR 78 | Ht 61.5 in | Wt 115.8 lb

## 2014-05-22 DIAGNOSIS — G35 Multiple sclerosis: Secondary | ICD-10-CM

## 2014-05-22 NOTE — Progress Notes (Signed)
[facility]  PATIENT: Amy Bray DOB: 1945/10/19  REFERRING CLINICIAN: Donnie Mesa HISTORY FROM: patient and husband  REASON FOR VISIT: follow up   HISTORICAL  CHIEF COMPLAINT:  Chief Complaint  Patient presents with  . Follow-up    Multiple Sclerosis     HISTORY OF PRESENT ILLNESS:   UPDATE 05/22/14: Since last visit, doing well. On tysabri since March 2016. Fatigue and bladder control much improved. Gait is stable.   UPDATE 02/20/14: Since last visit, had reviewed lab results and now ready to start MS medication. Now would like to start tysabri.  UPDATE 01/09/14: since last visit, no new events symptoms. LP results reviewed. Didn't get lab work at last visit for MS mimics.   UPDATE 12/07/13: Patient is stable with symptoms. MRI scans reviewed. Continues to have weakness in legs and balance difficulty.  UPDATE 11/21/13: 69 year old right-handed female here for evaluation of lower extremity spasms and weakness. 2002 patient developed onset of right leg stiffness, spasm, weakness. Patient was diagnosed with cervical spine disease, bone spur ridging, and treated with anterior cervical discectomy and fusion in 2003. Following this her right leg weakness and spasms significantly improved. However one year later her symptoms return. Patient followed up with several surgeons and ultimately had a second cervical spine surgery in 2006. Following this patient had slight improvement of right leg however within several months her weakness, spasms and gait continued to decline. Patient transitioned needing a cane to walk. 2013 patient was evaluated by a neurologist in [place], to evaluate her right leg weakness. Apparently patient had a "multiple sclerosis" workup including MRI of the brain and extensive blood testing. Patient also had EMG nerve conduction studies looking for peroneal neuropathy but apparently all studies were normal. MRI of the brain did show a right medulla  lesion which was reported as a stroke. Over the past 1 year patient has developed new onset left leg weakness, spasm, stiffness. Patient followed up with orthopedic surgery who then referred patient to me for evaluation. Last MRI of cervical spine was in 2006 at [facility] No bowel or bladder incontinence. No significant numbness or tingling. No weakness in upper extremities. No vision loss, vision changes, slurred speech or trouble talking.   REVIEW OF SYSTEMS: Full 14 system review of systems performed and notable only for weakness.  ALLERGIES: No Known Allergies  HOME MEDICATIONS: Outpatient Prescriptions Prior to Visit  Medication Sig Dispense Refill  . acetaminophen (TYLENOL) 325 MG tablet Take by mouth.    Marland Kitchen alendronate (FOSAMAX) 70 MG tablet Take 1 tablet by mouth once a week.    Marland Kitchen lisinopril (PRINIVIL,ZESTRIL) 10 MG tablet Take 1 tablet by mouth daily.    . nortriptyline (PAMELOR) 50 MG capsule Take 1 capsule by mouth daily.    Marland Kitchen tiZANidine (ZANAFLEX) 4 MG tablet Take 1 tablet by mouth every 6 (six) hours as needed.    . traMADol-acetaminophen (ULTRACET) 37.5-325 MG per tablet Take 1 tablet by mouth daily as needed.    . traMADol-acetaminophen (ULTRACET) 37.5-325 MG per tablet Take by mouth.     No facility-administered medications prior to visit.    PAST MEDICAL HISTORY: Past Medical History  Diagnosis Date  . Cervical myelopathy     PAST SURGICAL HISTORY: Past Surgical History  Procedure Laterality Date  . Cervical fusion  01/2001, 01/2004    C3-C6    FAMILY HISTORY: Family History  Problem Relation Age of Onset  . Transient ischemic attack Mother   . Pancreatic  cancer Father     SOCIAL HISTORY:  History   Social History  . Marital Status: Married    Spouse Name: Nadiah Corbit  . Number of Children: 0  . Years of Education: College   Occupational History  .  Other    n/a   Social History Main Topics  . Smoking status: Former Smoker -- 1.00 packs/day for 15  years    Types: Cigarettes    Quit date: 01/21/1983  . Smokeless tobacco: Never Used  . Alcohol Use: 0.0 oz/week    0 Standard drinks or equivalent per week     Comment: occasionally- wine  . Drug Use: No  . Sexual Activity: Not on file   Other Topics Concern  . Not on file   Social History Narrative   Patient lives at home with her spouse.   Caffeine Use-none     PHYSICAL EXAM  Filed Vitals:   05/22/14 1353  BP: 106/73  Pulse: 78  Height: 5' 1.5" (1.562 m)  Weight: 115 lb 12.8 oz (52.527 kg)    Not recorded      Visual Acuity Screening   Right eye Left eye Both eyes  Without correction:     With correction: 20/50 20/50      Body mass index is 21.53 kg/(m^2).  GENERAL EXAM: Patient is in no distress; well developed, nourished and groomed; neck is supple  CARDIOVASCULAR: Regular rate and rhythm, no murmurs, no carotid bruits  NEUROLOGIC: MENTAL STATUS: awake, alert, language fluent, comprehension intact, naming intact, fund of knowledge appropriate CRANIAL NERVE: pupils equal and reactive to light, visual fields full to confrontation, extraocular muscles intact, no nystagmus, facial sensation and strength symmetric, hearing intact, palate elevates symmetrically, uvula midline, shoulder shrug symmetric, tongue midline. MOTOR: normal bulk and tone in BUE; SIG INCREASED EXT TONE IN BLE; BLE (HF 3-4, KE 4, KF 3, DF 4) SENSORY: DECR VIB AT TOES (RIGHT 8 SEC, LEFT 6 SEC); NORMAL PP, TEMP COORDINATION: finger-nose-finger, fine finger movements normal REFLEXES: BUE 3 WITH SPREAD; BLE (KNEES 4, ANKLES 2) GAIT/STATION: SPASTIC, PARAPARETIC GAIT; UNSTEADY WITH SINGLE POINT CANE     DIAGNOSTIC DATA (LABS, IMAGING, TESTING) - I reviewed patient records, labs, notes, testing and imaging myself where available.  No results found for: WBC, HGB, HCT, MCV, PLT No results found for: NA, K, CL, CO2, GLUCOSE, BUN, CREATININE, CALCIUM, PROT, ALBUMIN, AST, ALT, ALKPHOS, BILITOT,  GFRNONAA, GFRAA No results found for: CHOL, HDL, LDLCALC, LDLDIRECT, TRIG, CHOLHDL No results found for: HGBA1C No results found for: VITAMINB12 No results found for: TSH  VIT D, 25-HYDROXY  Date Value Ref Range Status  01/09/2014 48.6 30.0 - 100.0 ng/mL Final    Comment:    Vitamin D deficiency has been defined by the [place] and an Endocrine Society practice guideline as a level of serum 25-OH vitamin D less than 20 ng/mL (1,2). The Endocrine Society went on to further define vitamin D insufficiency as a level between 21 and 29 ng/mL (2). 1. IOM (Institute of Medicine). 2010. Dietary reference    intakes for calcium and D. [place]: The    [employer]. 2. Holick MF, Binkley [place], Bischoff-Ferrari HA, et al.    Evaluation, treatment, and prevention of vitamin D    deficiency: an Endocrine Society clinical practice    guideline. JCEM. 2011 Jul; 96(7):1911-30.     I reviewed images myself. In my review, I think there is a 2cm spinal cord lesion (right side,  C6-C7) and a smaller left T1 lesion, suspicious for demyelinating disease, on the MRI cervical spine from 10/08/03. No adjacent disc or bone degeneration or spinal stenosis. The MRI report states that the cord appears normal. -VRP  10/08/03 MRI cervical spine (images reviewed) - Satisfactory anterior plate fusion of C4 and C5.Disk degeneration and spondylosis at C3-4 and C5-6 with biforaminal narrowing but no significant central canal stenosis. There is no disk protrusion.   11/06/11 MRI brain (report only) - "subtle area of increased T2 intensity in the right posterior upper medulla likely representing old infarct or possibly area of previous inflammation. Otherwise normal MRI of the brain."  12/04/13 MRI brain (with and without) demonstrating: 1. Few punctate foci of right frontal subcortical and periventricular non-specific gliosis. These findings are non-specific and considerations include  autoimmune, inflammatory, post-infectious, microvascular ischemic or migraine associated etiologies.  2. No abnormal enhancing lesions. No acute findings.  12/04/13 MRI cervical spine (with and without) demonstrating: 1. The spinal cord is notable for 3 subtle T2 hyperintense lesions: C5-6 on the left, C7 on the right, T2 slightly on the right of midline. No abnormal enhancing lesions. Considerations include autoimmune, inflammatory or post-infectious etiologies. No adjacent disc disease to suggest mechanical compressive etiologies.  2. At C6-7: left uncovertebral joint hypertrophy with moderate left foraminal stenosis. 3. Compared to prior MRI on 10/08/03, the C5-6 lesion appears new; the C7 and T2 spinal cord lesions appear stable. Also, there has been change in ACDF levels (previously from C4-C5; now C3-C6).   12/04/13 MRI thoracic spine (with and without) - unremarkable  12/29/13 LP - WBC 1, RBC 0, protein 26, glucose 59, OCB > 5, gram stain neg  01/10/14 anti-JCV ab - 0.10  Negative    ASSESSMENT AND PLAN  69 y.o. year old female here with bilateral lower extremity spasticity, weakness,with brisk reflexes in upper and lower extremities, left Hoffman sign, bilateral upgoing toes. MRIs from 2005 and 2015 demonstrate multiple spinal cord lesions. Now suspect demyelinating disease, with first attack in 2002, second attack in 2003, and 3rd attack in 2014.  Dx: multiple sclerosis   PLAN: - continue tysabri - repeat MRI brain and JCV ab q6 months - encouraged patient to use rollator walker  Orders Placed This Encounter  Procedures  . MR Brain W Wo Contrast  . CBC with Differential/Platelet  . Comprehensive metabolic panel  . Stratify JCV Antibody Test ([facility])   Return in about 4 months (around 09/22/2014).    Penni Bombard, MD 03/24/1935, 9:02 PM Certified in Neurology, Neurophysiology and Neuroimaging  [facility] [address] [place], [place]  [address] [phone]

## 2014-05-23 LAB — COMPREHENSIVE METABOLIC PANEL
ALT: 16 IU/L (ref 0–32)
AST: 14 IU/L (ref 0–40)
Albumin/Globulin Ratio: 2.5 (ref 1.1–2.5)
Albumin: 4.5 g/dL (ref 3.6–4.8)
Alkaline Phosphatase: 44 IU/L (ref 39–117)
BUN/Creatinine Ratio: 27 — ABNORMAL HIGH (ref 11–26)
BUN: 15 mg/dL (ref 8–27)
Bilirubin Total: 0.3 mg/dL (ref 0.0–1.2)
CO2: 27 mmol/L (ref 18–29)
Calcium: 9.3 mg/dL (ref 8.7–10.3)
Chloride: 100 mmol/L (ref 97–108)
Creatinine, Ser: 0.55 mg/dL — ABNORMAL LOW (ref 0.57–1.00)
GFR calc Af Amer: 112 mL/min/{1.73_m2} (ref >59)
GFR calc non Af Amer: 97 mL/min/{1.73_m2} (ref >59)
Globulin, Total: 1.8 g/dL (ref 1.5–4.5)
Glucose: 90 mg/dL (ref 65–99)
Potassium: 3.9 mmol/L (ref 3.5–5.2)
Sodium: 140 mmol/L (ref 134–144)
Total Protein: 6.3 g/dL (ref 6.0–8.5)

## 2014-05-23 LAB — CBC WITH DIFFERENTIAL/PLATELET
Basophils Absolute: 0 10*3/uL (ref 0.0–0.2)
Basos: 1 %
EOS (ABSOLUTE): 0.1 10*3/uL (ref 0.0–0.4)
Eos: 3 %
Hematocrit: 37.2 % (ref 34.0–46.6)
Hemoglobin: 12.7 g/dL (ref 11.1–15.9)
Immature Grans (Abs): 0 10*3/uL (ref 0.0–0.1)
Immature Granulocytes: 0 %
Lymphocytes Absolute: 2.3 10*3/uL (ref 0.7–3.1)
Lymphs: 42 %
MCH: 32.1 pg (ref 26.6–33.0)
MCHC: 34.1 g/dL (ref 31.5–35.7)
MCV: 94 fL (ref 79–97)
Monocytes Absolute: 0.5 10*3/uL (ref 0.1–0.9)
Monocytes: 9 %
Neutrophils Absolute: 2.5 10*3/uL (ref 1.4–7.0)
Neutrophils: 45 %
Platelets: 293 10*3/uL (ref 150–379)
RBC: 3.96 x10E6/uL (ref 3.77–5.28)
RDW: 12.7 % (ref 12.3–15.4)
WBC: 5.4 10*3/uL (ref 3.4–10.8)

## 2014-06-02 DIAGNOSIS — G35 Multiple sclerosis: Secondary | ICD-10-CM | POA: Diagnosis not present

## 2014-06-05 DIAGNOSIS — G35 Multiple sclerosis: Secondary | ICD-10-CM | POA: Diagnosis not present

## 2014-06-16 DIAGNOSIS — N3941 Urge incontinence: Secondary | ICD-10-CM | POA: Diagnosis not present

## 2014-06-16 DIAGNOSIS — R350 Frequency of micturition: Secondary | ICD-10-CM | POA: Diagnosis not present

## 2014-06-16 DIAGNOSIS — R3915 Urgency of urination: Secondary | ICD-10-CM | POA: Diagnosis not present

## 2014-07-03 DIAGNOSIS — M533 Sacrococcygeal disorders, not elsewhere classified: Secondary | ICD-10-CM | POA: Diagnosis not present

## 2014-07-03 DIAGNOSIS — M5417 Radiculopathy, lumbosacral region: Secondary | ICD-10-CM | POA: Diagnosis not present

## 2014-07-03 DIAGNOSIS — Z79899 Other long term (current) drug therapy: Secondary | ICD-10-CM | POA: Diagnosis not present

## 2014-07-03 DIAGNOSIS — Z5181 Encounter for therapeutic drug level monitoring: Secondary | ICD-10-CM | POA: Diagnosis not present

## 2014-07-05 DIAGNOSIS — G35 Multiple sclerosis: Secondary | ICD-10-CM | POA: Diagnosis not present

## 2014-07-11 ENCOUNTER — Other Ambulatory Visit (HOSPITAL_COMMUNITY): Payer: Self-pay | Admitting: Family Medicine

## 2014-07-11 DIAGNOSIS — Z1231 Encounter for screening mammogram for malignant neoplasm of breast: Secondary | ICD-10-CM

## 2014-07-17 ENCOUNTER — Other Ambulatory Visit: Payer: Self-pay

## 2014-07-20 DIAGNOSIS — R3915 Urgency of urination: Secondary | ICD-10-CM | POA: Diagnosis not present

## 2014-07-20 DIAGNOSIS — N3941 Urge incontinence: Secondary | ICD-10-CM | POA: Diagnosis not present

## 2014-07-20 DIAGNOSIS — R350 Frequency of micturition: Secondary | ICD-10-CM | POA: Diagnosis not present

## 2014-07-31 DIAGNOSIS — G35 Multiple sclerosis: Secondary | ICD-10-CM | POA: Diagnosis not present

## 2014-08-14 ENCOUNTER — Ambulatory Visit (HOSPITAL_COMMUNITY)
Admission: RE | Admit: 2014-08-14 | Discharge: 2014-08-14 | Disposition: A | Discharge status: Home / Self Care | Payer: Medicare Other | Source: Ambulatory Visit | Attending: Family Medicine | Admitting: Family Medicine

## 2014-08-14 DIAGNOSIS — Z1231 Encounter for screening mammogram for malignant neoplasm of breast: Secondary | ICD-10-CM | POA: Diagnosis not present

## 2014-08-18 DIAGNOSIS — R350 Frequency of micturition: Secondary | ICD-10-CM | POA: Diagnosis not present

## 2014-08-18 DIAGNOSIS — N3941 Urge incontinence: Secondary | ICD-10-CM | POA: Diagnosis not present

## 2014-08-18 DIAGNOSIS — R3915 Urgency of urination: Secondary | ICD-10-CM | POA: Diagnosis not present

## 2014-08-28 DIAGNOSIS — G35 Multiple sclerosis: Secondary | ICD-10-CM | POA: Diagnosis not present

## 2014-09-01 DIAGNOSIS — M533 Sacrococcygeal disorders, not elsewhere classified: Secondary | ICD-10-CM | POA: Diagnosis not present

## 2014-09-01 DIAGNOSIS — Z5181 Encounter for therapeutic drug level monitoring: Secondary | ICD-10-CM | POA: Diagnosis not present

## 2014-09-01 DIAGNOSIS — M62838 Other muscle spasm: Secondary | ICD-10-CM | POA: Diagnosis not present

## 2014-09-12 ENCOUNTER — Encounter: Payer: Self-pay | Admitting: *Deleted

## 2014-09-15 DIAGNOSIS — R3915 Urgency of urination: Secondary | ICD-10-CM | POA: Diagnosis not present

## 2014-09-15 DIAGNOSIS — N3941 Urge incontinence: Secondary | ICD-10-CM | POA: Diagnosis not present

## 2014-09-15 DIAGNOSIS — R350 Frequency of micturition: Secondary | ICD-10-CM | POA: Diagnosis not present

## 2014-09-26 DIAGNOSIS — G35 Multiple sclerosis: Secondary | ICD-10-CM | POA: Diagnosis not present

## 2014-09-28 ENCOUNTER — Telehealth: Payer: Self-pay | Admitting: Diagnostic Neuroimaging

## 2014-09-28 ENCOUNTER — Ambulatory Visit
Admission: RE | Admit: 2014-09-28 | Discharge: 2014-09-28 | Disposition: A | Discharge status: Home / Self Care | Payer: Medicare Other | Source: Ambulatory Visit | Attending: Diagnostic Neuroimaging | Admitting: Diagnostic Neuroimaging

## 2014-09-28 DIAGNOSIS — G35 Multiple sclerosis: Secondary | ICD-10-CM | POA: Diagnosis not present

## 2014-09-28 MED ORDER — GADOBENATE DIMEGLUMINE 529 MG/ML IV SOLN
10.0000 mL | Freq: Once | INTRAVENOUS | Status: AC | PRN
  Administered 2014-09-28: 10 mL via INTRAVENOUS

## 2014-09-28 NOTE — Telephone Encounter (Addendum)
//  Spoke with patient who states she had MRI at [place] today and has had her records from [facility] transferred there for future steroid injections in her coccyx. She states she has received these injections for a long time but "has gone a while without one" . She states "I am really hurting and can hardly sleep." She states [facility] needs an order from Dr/ [facility] for her injection. She is requesting this order be sent this week so she can get injection next week. Informed her this RN is off tomorrow but will discuss this with Dr Leta Baptist today. She verbalized understanding, appreciation.  Renick, [phone], Spine Services to clarify what is needed for patient to receive injections. Left vm for Katharine Look requesting she call back tomorrow and speak with Katrina, RN and let her know how Dr Leta Baptist needs to proceed.  Informed her this RN is off tomorrow.  Will route this information to Dr Leta Baptist.  4:30 pm  Spoke with patient and informed her that this RN spoke with Katharine Look who stated Dr Jola Baptist has no availability next week to give her injection. Also informed her Dr Leta Baptist would like to discuss her receiving these injections at [facility]. She has FU on 10/02/14, and he will discuss this further with her then. She verbalized understanding, agreement and appreciation for this call.

## 2014-09-28 NOTE — Telephone Encounter (Signed)
Patient needs to establish with pain mgmt clinic here in [place]. Will setup referral. -VRP

## 2014-09-28 NOTE — Telephone Encounter (Signed)
Patient called requesting orders to be sent to [facility] for epidural. Records have been received from [facility]. Dr Jola Baptist has pts records now. Please call and advise. Patient can be reached at [phone].

## 2014-10-02 ENCOUNTER — Ambulatory Visit (INDEPENDENT_AMBULATORY_CARE_PROVIDER_SITE_OTHER): Payer: Medicare Other | Admitting: Diagnostic Neuroimaging

## 2014-10-02 ENCOUNTER — Encounter: Payer: Self-pay | Admitting: Diagnostic Neuroimaging

## 2014-10-02 VITALS — BP 124/84 | HR 86 | Ht 61.0 in | Wt 114.0 lb

## 2014-10-02 DIAGNOSIS — M545 Low back pain: Secondary | ICD-10-CM

## 2014-10-02 DIAGNOSIS — M533 Sacrococcygeal disorders, not elsewhere classified: Secondary | ICD-10-CM | POA: Diagnosis not present

## 2014-10-02 MED ORDER — DALFAMPRIDINE ER 10 MG PO TB12: 10.0000 mg | ORAL_TABLET | Freq: Two times a day (BID) | ORAL | Status: DC

## 2014-10-02 NOTE — Progress Notes (Signed)
[facility]  PATIENT: Amy Bray DOB: 02-Apr-1945  REFERRING CLINICIAN:  HISTORY FROM: patient and husband  REASON FOR VISIT: follow up   HISTORICAL  CHIEF COMPLAINT:  Chief Complaint  Patient presents with  . Multiple sclerosis    rm 7, husband  . Follow-up    HISTORY OF PRESENT ILLNESS:   UPDATE 10/02/14: Since last visit, doing well. Requesting transition of care to [place] for pain mgmt; previously at [facility] with Dr. Oliva Bustard.  UPDATE 05/22/14: Since last visit, doing well. On tysabri since March 2016. Fatigue and bladder control much improved. Gait is stable.   UPDATE 02/20/14: Since last visit, had reviewed lab results and now ready to start MS medication. Now would like to start tysabri.  UPDATE 01/09/14: Since last visit, no new events symptoms. LP results reviewed. Didn't get lab work at last visit for MS mimics.   UPDATE 12/07/13: Patient is stable with symptoms. MRI scans reviewed. Continues to have weakness in legs and balance difficulty.  UPDATE 11/21/13: 69 year old right-handed female here for evaluation of lower extremity spasms and weakness. 2002 patient developed onset of right leg stiffness, spasm, weakness. Patient was diagnosed with cervical spine disease, bone spur ridging, and treated with anterior cervical discectomy and fusion in 2003. Following this her right leg weakness and spasms significantly improved. However one year later her symptoms return. Patient followed up with several surgeons and ultimately had a second cervical spine surgery in 2006. Following this patient had slight improvement of right leg however within several months her weakness, spasms and gait continued to decline. Patient transitioned needing a cane to walk. 2013 patient was evaluated by a neurologist in [place], to evaluate her right leg weakness. Apparently patient had a "multiple sclerosis" workup including MRI of the brain and extensive blood testing. Patient  also had EMG nerve conduction studies looking for peroneal neuropathy but apparently all studies were normal. MRI of the brain did show a right medulla lesion which was reported as a stroke. Over the past 1 year patient has developed new onset left leg weakness, spasm, stiffness. Patient followed up with orthopedic surgery who then referred patient to me for evaluation. Last MRI of cervical spine was in 2006 at [facility]. No bowel or bladder incontinence. No significant numbness or tingling. No weakness in upper extremities. No vision loss, vision changes, slurred speech or trouble talking.   REVIEW OF SYSTEMS: Full 14 system review of systems performed and notable only for weakness.  ALLERGIES: No Known Allergies  HOME MEDICATIONS: Outpatient Prescriptions Prior to Visit  Medication Sig Dispense Refill  . acetaminophen (TYLENOL) 325 MG tablet Take by mouth.    Marland Kitchen alendronate (FOSAMAX) 70 MG tablet Take 1 tablet by mouth once a week.    . baclofen (LIORESAL) 10 MG tablet     . lisinopril (PRINIVIL,ZESTRIL) 10 MG tablet Take 1 tablet by mouth daily.    . natalizumab (TYSABRI) 300 MG/15ML injection Inject into the vein.    Marland Kitchen nortriptyline (PAMELOR) 50 MG capsule Take 1 capsule by mouth daily.    . traMADol-acetaminophen (ULTRACET) 37.5-325 MG per tablet Take 1 tablet by mouth daily as needed.    Marland Kitchen tiZANidine (ZANAFLEX) 4 MG tablet Take 1 tablet by mouth every 6 (six) hours as needed.     No facility-administered medications prior to visit.    PAST MEDICAL HISTORY: Past Medical History  Diagnosis Date  . Cervical myelopathy     PAST SURGICAL HISTORY: Past Surgical History  Procedure Laterality Date  . Cervical fusion  01/2001, 01/2004    C3-C6    FAMILY HISTORY: Family History  Problem Relation Age of Onset  . Transient ischemic attack Mother   . Pancreatic cancer Father     SOCIAL HISTORY:  Social History   Social History  . Marital Status: Married    Spouse Name: Lennie Dunnigan    . Number of Children: 0  . Years of Education: College   Occupational History  .  Other    n/a   Social History Main Topics  . Smoking status: Former Smoker -- 1.00 packs/day for 15 years    Types: Cigarettes    Quit date: 01/21/1983  . Smokeless tobacco: Never Used  . Alcohol Use: 0.0 oz/week    0 Standard drinks or equivalent per week     Comment: occasionally- wine  . Drug Use: No  . Sexual Activity: Not on file   Other Topics Concern  . Not on file   Social History Narrative   Patient lives at home with her spouse.   Caffeine Use-none     PHYSICAL EXAM  Filed Vitals:   10/02/14 1438  BP: 124/84  Pulse: 86  Height: 5\' 1"  (1.549 m)  Weight: 114 lb (51.71 kg)    Not recorded     No exam data present   Body mass index is 21.55 kg/(m^2).  GENERAL EXAM: Patient is in no distress; well developed, nourished and groomed; neck is supple  CARDIOVASCULAR: Regular rate and rhythm, no murmurs, no carotid bruits  NEUROLOGIC: MENTAL STATUS: awake, alert, language fluent, comprehension intact, naming intact, fund of knowledge appropriate CRANIAL NERVE: pupils equal and reactive to light, visual fields full to confrontation, extraocular muscles intact, no nystagmus, facial sensation and strength symmetric, hearing intact, palate elevates symmetrically, uvula midline, shoulder shrug symmetric, tongue midline. MOTOR: normal bulk and tone in BUE; SIG INCREASED EXT TONE IN BLE; BLE (HF 3-4, KE 5 KF 4 DF 4) SENSORY: DECR VIB AT TOES (RIGHT 8 SEC, LEFT 6 SEC); NORMAL PP, TEMP COORDINATION: finger-nose-finger, fine finger movements normal REFLEXES: BUE 3 WITH SPREAD; BLE (KNEES 4, ANKLES 2) GAIT/STATION: SPASTIC, PARAPARETIC GAIT; UNSTEADY WITH SINGLE POINT CANE TIMED 25 FOOT WALK TRIALS: 43 sec, 43 sec     DIAGNOSTIC DATA (LABS, IMAGING, TESTING) - I reviewed patient records, labs, notes, testing and imaging myself where available.  Lab Results  Component Value Date    WBC 5.4 05/22/2014   HCT 37.2 05/22/2014      Component Value Date/Time   NA 140 05/22/2014 1447   K 3.9 05/22/2014 1447   CL 100 05/22/2014 1447   CO2 27 05/22/2014 1447   GLUCOSE 90 05/22/2014 1447   BUN 15 05/22/2014 1447   CREATININE 0.55* 05/22/2014 1447   CALCIUM 9.3 05/22/2014 1447   PROT 6.3 05/22/2014 1447   AST 14 05/22/2014 1447   ALT 16 05/22/2014 1447   ALKPHOS 44 05/22/2014 1447   BILITOT 0.3 05/22/2014 1447   GFRNONAA 97 05/22/2014 1447   GFRAA 112 05/22/2014 1447   No results found for: CHOL, HDL, LDLCALC, LDLDIRECT, TRIG, CHOLHDL No results found for: HGBA1C No results found for: VITAMINB12 No results found for: TSH  VIT D, 25-HYDROXY  Date Value Ref Range Status  01/09/2014 48.6 30.0 - 100.0 ng/mL Final    Comment:    Vitamin D deficiency has been defined by the [place] practice guideline as a level of serum  25-OH vitamin D less than 20 ng/mL (1,2). The Endocrine Society went on to further define vitamin D insufficiency as a level between 21 and 29 ng/mL (2). 1. IOM (Institute of Medicine). 2010. Dietary reference    intakes for calcium and D. [place]: The    [employer]. 2. Holick MF, Binkley [place], Bischoff-Ferrari HA, et al.    Evaluation, treatment, and prevention of vitamin D    deficiency: an Endocrine Society clinical practice    guideline. JCEM. 2011 Jul; 96(7):1911-30.     I reviewed images myself. In my review, I think there is a 2cm spinal cord lesion (right side, C6-C7) and a smaller left T1 lesion, suspicious for demyelinating disease, on the MRI cervical spine from 10/08/03. No adjacent disc or bone degeneration or spinal stenosis. The MRI report states that the cord appears normal. -VRP  10/08/03 MRI cervical spine (images reviewed) - Satisfactory anterior plate fusion of C4 and C5.Disk degeneration and spondylosis at C3-4 and C5-6 with biforaminal narrowing but no significant  central canal stenosis. There is no disk protrusion.   11/06/11 MRI brain (report only) - "subtle area of increased T2 intensity in the right posterior upper medulla likely representing old infarct or possibly area of previous inflammation. Otherwise normal MRI of the brain."  12/04/13 MRI brain (with and without) demonstrating: 1. Few punctate foci of right frontal subcortical and periventricular non-specific gliosis. These findings are non-specific and considerations include autoimmune, inflammatory, post-infectious, microvascular ischemic or migraine associated etiologies.  2. No abnormal enhancing lesions. No acute findings.  12/04/13 MRI cervical spine (with and without) demonstrating: 1. The spinal cord is notable for 3 subtle T2 hyperintense lesions: C5-6 on the left, C7 on the right, T2 slightly on the right of midline. No abnormal enhancing lesions. Considerations include autoimmune, inflammatory or post-infectious etiologies. No adjacent disc disease to suggest mechanical compressive etiologies.  2. At C6-7: left uncovertebral joint hypertrophy with moderate left foraminal stenosis. 3. Compared to prior MRI on 10/08/03, the C5-6 lesion appears new; the C7 and T2 spinal cord lesions appear stable. Also, there has been change in ACDF levels (previously from C4-C5; now C3-C6).   12/04/13 MRI thoracic spine (with and without) - unremarkable  12/29/13 LP - WBC 1, RBC 0, protein 26, glucose 59, OCB > 5, gram stain neg  09/28/14 MRI brain [I reviewed images myself and agree with interpretation. -VRP]  1. There is a small T2/FLAIR hyperintense focus in the right frontal lobe. This is nonspecific and could be idiopathic or represent a small focus of chronic microvascular ischemic change or demyelination.. The extent is within a normal range for her age. 2. There are no enhancing lesions or acute findings 3. Compared to the MRI dated 12/04/2013, there is no definite interval  change.   01/10/14 anti-JCV ab - 0.10 Negative  05/22/14 anti-JCV ab - 0.09 Negative     ASSESSMENT AND PLAN  69 y.o. year old female here with bilateral lower extremity spasticity, weakness,with brisk reflexes in upper and lower extremities, left Hoffman sign, bilateral upgoing toes. MRIs from 2005 and 2015 demonstrate multiple spinal cord lesions. Now suspect demyelinating disease, with first attack in 2002, second attack in 2003, and 3rd attack in 2014.  Dx: multiple sclerosis    PLAN: - continue tysabri - repeat MRI brain and JCV ab q6 months (next in March 2017) - encouraged patient to use rollator walker - will refer patient to pain mgmt clinic here in [place] (previously seen at [facility];  Dr. Oliva Bustard) - trial of ampyra  Orders Placed This Encounter  Procedures  . Ambulatory referral to Pain Clinic   Return in about 3 months (around 01/01/2015).    Penni Bombard, MD 8/88/9169, 4:50 PM Certified in Neurology, Neurophysiology and Neuroimaging  [facility] [address] [place],  [address] [phone]

## 2014-10-02 NOTE — Patient Instructions (Signed)
Continue tysabri.  Will try ampyra.

## 2014-10-10 NOTE — Telephone Encounter (Signed)
Patient called to advise she hasn't heard from Dr. Maryjean Ka office yet and it's been over a week. Still no appointment yet for pain mgmt.

## 2014-10-10 NOTE — Telephone Encounter (Signed)
Left vm requesting call back, re: patient's referral to that office. Left this caller's name, number.

## 2014-10-11 NOTE — Telephone Encounter (Addendum)
Spoke with Amy Bray who states Dr Donell Sievert office never received the referral. Refaxed to [phone].  Spoke with patient and informed her information re faxed to Dr Donell Sievert office this afternoon. Advised she mail copy of Dr Oliva Bustard notes from [facility] to Dr Maryjean Ka office.  She stated she would do that and call his office if she has not heard back within 7-10 days. She expressed appreciation for this call back.

## 2014-10-11 NOTE — Telephone Encounter (Signed)
Angela/[facility] Dr. Maryjean Ka office returned call [phone]

## 2014-10-11 NOTE — Telephone Encounter (Signed)
Patient called back regarding referral to Dr. Maryjean Ka office. Patient states "she's hurting real bad", would like to have referral to Dr. Donell Sievert office taken care of.

## 2014-10-13 DIAGNOSIS — N3941 Urge incontinence: Secondary | ICD-10-CM | POA: Diagnosis not present

## 2014-10-17 ENCOUNTER — Telehealth: Payer: Self-pay | Admitting: Diagnostic Neuroimaging

## 2014-10-17 NOTE — Telephone Encounter (Signed)
Dr. Jodene Nam office called and stated she will call the patient for an appointment with their office.  Thanks!

## 2014-10-19 ENCOUNTER — Telehealth: Payer: Self-pay | Admitting: Diagnostic Neuroimaging

## 2014-10-19 NOTE — Telephone Encounter (Signed)
Spoke with patient who inquired why she is not seeing Dr Maryjean Ka. Informed her this office received a fax stating he did not accept her referral, so she was referred to [facility]. She states she has an appointment next Friday. She verbalized understanding, appreciation.

## 2014-10-19 NOTE — Telephone Encounter (Signed)
Pt called and would like a nurse to call her back . It is in regards to Dr. Maryjean Ka. Please call and advise [phone]

## 2014-10-23 DIAGNOSIS — G35 Multiple sclerosis: Secondary | ICD-10-CM | POA: Diagnosis not present

## 2014-10-27 DIAGNOSIS — Z79899 Other long term (current) drug therapy: Secondary | ICD-10-CM | POA: Diagnosis not present

## 2014-10-27 DIAGNOSIS — G894 Chronic pain syndrome: Secondary | ICD-10-CM | POA: Diagnosis not present

## 2014-10-27 DIAGNOSIS — M533 Sacrococcygeal disorders, not elsewhere classified: Secondary | ICD-10-CM | POA: Diagnosis not present

## 2014-11-06 ENCOUNTER — Telehealth: Payer: Self-pay | Admitting: Diagnostic Neuroimaging

## 2014-11-06 MED ORDER — DALFAMPRIDINE ER 10 MG PO TB12: 10.0000 mg | ORAL_TABLET | Freq: Two times a day (BID) | ORAL | Status: DC

## 2014-11-06 NOTE — Telephone Encounter (Signed)
Rx has been provided.  Pharmacy is [facility], which is affiliated with the Patient Assistance Program.  They will be assisting the patient with getting this medication.

## 2014-11-06 NOTE — Telephone Encounter (Signed)
Cam Hai with [facility] is calling as she needs an order for Rx dalfampride 10 mg.  Please call her @[phone] opt [phone].  Please call.

## 2014-11-10 DIAGNOSIS — R350 Frequency of micturition: Secondary | ICD-10-CM | POA: Diagnosis not present

## 2014-11-10 DIAGNOSIS — N3941 Urge incontinence: Secondary | ICD-10-CM | POA: Diagnosis not present

## 2014-11-10 DIAGNOSIS — R3915 Urgency of urination: Secondary | ICD-10-CM | POA: Diagnosis not present

## 2014-11-16 DIAGNOSIS — G35 Multiple sclerosis: Secondary | ICD-10-CM | POA: Diagnosis not present

## 2014-11-16 DIAGNOSIS — G8929 Other chronic pain: Secondary | ICD-10-CM | POA: Diagnosis not present

## 2014-11-16 DIAGNOSIS — Z23 Encounter for immunization: Secondary | ICD-10-CM | POA: Diagnosis not present

## 2014-11-16 DIAGNOSIS — I10 Essential (primary) hypertension: Secondary | ICD-10-CM | POA: Diagnosis not present

## 2014-11-16 DIAGNOSIS — Z8371 Family history of colonic polyps: Secondary | ICD-10-CM | POA: Diagnosis not present

## 2014-11-16 DIAGNOSIS — M810 Age-related osteoporosis without current pathological fracture: Secondary | ICD-10-CM | POA: Diagnosis not present

## 2014-11-16 DIAGNOSIS — M899 Disorder of bone, unspecified: Secondary | ICD-10-CM | POA: Diagnosis not present

## 2014-11-16 DIAGNOSIS — R7301 Impaired fasting glucose: Secondary | ICD-10-CM | POA: Diagnosis not present

## 2014-11-16 DIAGNOSIS — Z0000 Encounter for general adult medical examination without abnormal findings: Secondary | ICD-10-CM | POA: Diagnosis not present

## 2014-11-16 DIAGNOSIS — R8290 Unspecified abnormal findings in urine: Secondary | ICD-10-CM | POA: Diagnosis not present

## 2014-11-20 DIAGNOSIS — G35 Multiple sclerosis: Secondary | ICD-10-CM | POA: Diagnosis not present

## 2014-11-24 DIAGNOSIS — M5137 Other intervertebral disc degeneration, lumbosacral region: Secondary | ICD-10-CM | POA: Diagnosis not present

## 2014-12-08 DIAGNOSIS — N3941 Urge incontinence: Secondary | ICD-10-CM | POA: Diagnosis not present

## 2014-12-18 DIAGNOSIS — G35 Multiple sclerosis: Secondary | ICD-10-CM | POA: Diagnosis not present

## 2014-12-22 DIAGNOSIS — M5137 Other intervertebral disc degeneration, lumbosacral region: Secondary | ICD-10-CM | POA: Diagnosis not present

## 2014-12-22 DIAGNOSIS — G894 Chronic pain syndrome: Secondary | ICD-10-CM | POA: Diagnosis not present

## 2014-12-22 DIAGNOSIS — Z79899 Other long term (current) drug therapy: Secondary | ICD-10-CM | POA: Diagnosis not present

## 2015-01-03 ENCOUNTER — Ambulatory Visit (INDEPENDENT_AMBULATORY_CARE_PROVIDER_SITE_OTHER): Payer: Medicare Other | Admitting: Diagnostic Neuroimaging

## 2015-01-03 ENCOUNTER — Encounter: Payer: Self-pay | Admitting: Diagnostic Neuroimaging

## 2015-01-03 VITALS — BP 113/71 | HR 104 | Ht 61.0 in | Wt 117.8 lb

## 2015-01-03 DIAGNOSIS — G35 Multiple sclerosis: Secondary | ICD-10-CM | POA: Diagnosis not present

## 2015-01-03 DIAGNOSIS — E559 Vitamin D deficiency, unspecified: Secondary | ICD-10-CM | POA: Diagnosis not present

## 2015-01-03 NOTE — Progress Notes (Signed)
[facility]  PATIENT: Amy Bray DOB: 07/18/1945  REFERRING CLINICIAN:  HISTORY FROM: patient and husband  REASON FOR VISIT: follow up   HISTORICAL  CHIEF COMPLAINT:  Chief Complaint  Patient presents with  . Follow-up    MS    HISTORY OF PRESENT ILLNESS:   UPDATE 01/03/15: Since last visit, doing better on ampyra (better gait and walking speed). More foot/calf tingling burning pain x 1-2 months. Now with Dr. Andree Elk pain mgmt for back injections x 2, which is helping.  UPDATE 10/02/14: Since last visit, doing well. Requesting transition of care to [facility] for pain mgmt; previously at [facility] with Dr. Oliva Bustard.  UPDATE 05/22/14: Since last visit, doing well. On tysabri since March 2016. Fatigue and bladder control much improved. Gait is stable.   UPDATE 02/20/14: Since last visit, had reviewed lab results and now ready to start MS medication. Now would like to start tysabri.  UPDATE 01/09/14: Since last visit, no new events symptoms. LP results reviewed. Didn't get lab work at last visit for MS mimics.   UPDATE 12/07/13: Patient is stable with symptoms. MRI scans reviewed. Continues to have weakness in legs and balance difficulty.  UPDATE 11/21/13: 69 year old right-handed female here for evaluation of lower extremity spasms and weakness. 2002 patient developed onset of right leg stiffness, spasm, weakness. Patient was diagnosed with cervical spine disease, bone spur ridging, and treated with anterior cervical discectomy and fusion in 2003. Following this her right leg weakness and spasms significantly improved. However one year later her symptoms return. Patient followed up with several surgeons and ultimately had a second cervical spine surgery in 2006. Following this patient had slight improvement of right leg however within several months her weakness, spasms and gait continued to decline. Patient transitioned needing a cane to walk. 2013 patient was evaluated by a  neurologist in [place], to evaluate her right leg weakness. Apparently patient had a "multiple sclerosis" workup including MRI of the brain and extensive blood testing. Patient also had EMG nerve conduction studies looking for peroneal neuropathy but apparently all studies were normal. MRI of the brain did show a right medulla lesion which was reported as a stroke. Over the past 1 year patient has developed new onset left leg weakness, spasm, stiffness. Patient followed up with orthopedic surgery who then referred patient to me for evaluation. Last MRI of cervical spine was in 2006 at [facility]. No bowel or bladder incontinence. No significant numbness or tingling. No weakness in upper extremities. No vision loss, vision changes, slurred speech or trouble talking.   REVIEW OF SYSTEMS: Full 14 system review of systems performed and notable only for weakness.  ALLERGIES: No Known Allergies  HOME MEDICATIONS: Outpatient Prescriptions Prior to Visit  Medication Sig Dispense Refill  . acetaminophen (TYLENOL) 325 MG tablet Take by mouth.    Marland Kitchen alendronate (FOSAMAX) 70 MG tablet Take 1 tablet by mouth once a week.    . baclofen (LIORESAL) 10 MG tablet     . dalfampridine (AMPYRA) 10 MG TB12 Take 1 tablet (10 mg total) by mouth 2 (two) times daily. 60 tablet 6  . lisinopril (PRINIVIL,ZESTRIL) 10 MG tablet Take 1 tablet by mouth daily.    . natalizumab (TYSABRI) 300 MG/15ML injection Inject into the vein.    Marland Kitchen nortriptyline (PAMELOR) 50 MG capsule Take 1 capsule by mouth daily.    . traMADol-acetaminophen (ULTRACET) 37.5-325 MG per tablet Take 1 tablet by mouth daily as needed.  No facility-administered medications prior to visit.    PAST MEDICAL HISTORY: Past Medical History  Diagnosis Date  . Cervical myelopathy ([place])     PAST SURGICAL HISTORY: Past Surgical History  Procedure Laterality Date  . Cervical fusion  01/2001, 01/2004    C3-C6    FAMILY HISTORY: Family History  Problem  Relation Age of Onset  . Transient ischemic attack Mother   . Pancreatic cancer Father     SOCIAL HISTORY:  Social History   Social History  . Marital Status: Married    Spouse Name: Vernetha Querry  . Number of Children: 0  . Years of Education: College   Occupational History  .  Other    n/a   Social History Main Topics  . Smoking status: Former Smoker -- 1.00 packs/day for 15 years    Types: Cigarettes    Quit date: 01/21/1983  . Smokeless tobacco: Never Used  . Alcohol Use: 0.0 oz/week    0 Standard drinks or equivalent per week     Comment: occasionally- wine  . Drug Use: No  . Sexual Activity: Not on file   Other Topics Concern  . Not on file   Social History Narrative   Patient lives at home with her spouse.   Caffeine Use-none     PHYSICAL EXAM  Filed Vitals:   01/03/15 1336  BP: 113/71  Pulse: 104  Height: 5\' 1"  (1.549 m)  Weight: 117 lb 12.8 oz (53.434 kg)    Not recorded     No exam data present   Body mass index is 22.27 kg/(m^2).  GENERAL EXAM: Patient is in no distress; well developed, nourished and groomed; neck is supple  CARDIOVASCULAR: Regular rate and rhythm, no murmurs, no carotid bruits  NEUROLOGIC: MENTAL STATUS: awake, alert, language fluent, comprehension intact, naming intact, fund of knowledge appropriate CRANIAL NERVE: pupils equal and reactive to light, visual fields full to confrontation, extraocular muscles intact, no nystagmus, facial sensation and strength symmetric, hearing intact, palate elevates symmetrically, uvula midline, shoulder shrug symmetric, tongue midline. MOTOR: normal bulk and tone in BUE; MILD INCREASED EXT TONE IN BLE; BLE (HF 4, KE 5 KF 4 DF 4) SENSORY: DECR VIB AT TOES  COORDINATION: finger-nose-finger, fine finger movements normal REFLEXES: BUE 3 WITH SPREAD; BLE (KNEES 4, ANKLES 2) GAIT/STATION: SPASTIC, PARAPARETIC GAIT TIMED 25 FOOT WALK TRIAL: 26 sec     DIAGNOSTIC DATA (LABS, IMAGING,  TESTING) - I reviewed patient records, labs, notes, testing and imaging myself where available.  Lab Results  Component Value Date   WBC 5.4 05/22/2014   HCT 37.2 05/22/2014      Component Value Date/Time   NA 140 05/22/2014 1447   K 3.9 05/22/2014 1447   CL 100 05/22/2014 1447   CO2 27 05/22/2014 1447   GLUCOSE 90 05/22/2014 1447   BUN 15 05/22/2014 1447   CREATININE 0.55* 05/22/2014 1447   CALCIUM 9.3 05/22/2014 1447   PROT 6.3 05/22/2014 1447   ALBUMIN 4.5 05/22/2014 1447   AST 14 05/22/2014 1447   ALT 16 05/22/2014 1447   ALKPHOS 44 05/22/2014 1447   BILITOT 0.3 05/22/2014 1447   GFRNONAA 97 05/22/2014 1447   GFRAA 112 05/22/2014 1447   No results found for: CHOL, HDL, LDLCALC, LDLDIRECT, TRIG, CHOLHDL No results found for: HGBA1C No results found for: VITAMINB12 No results found for: TSH  VIT D, 25-HYDROXY  Date Value Ref Range Status  01/09/2014 48.6 30.0 - 100.0 ng/mL Final  Comment:    Vitamin D deficiency has been defined by the [place] practice guideline as a level of serum 25-OH vitamin D less than 20 ng/mL (1,2). The Endocrine Society went on to further define vitamin D insufficiency as a level between 21 and 29 ng/mL (2). 1. IOM (Institute of Medicine). 2010. Dietary reference    intakes for calcium and D. [place]: The    [employer]. 2. Holick MF, Binkley Lillington, Bischoff-Ferrari HA, et al.    Evaluation, treatment, and prevention of vitamin D    deficiency: an Endocrine Society clinical practice    guideline. JCEM. 2011 Jul; 96(7):1911-30.     I reviewed images myself. In my review, I think there is a 2cm spinal cord lesion (right side, C6-C7) and a smaller left T1 lesion, suspicious for demyelinating disease, on the MRI cervical spine from 10/08/03. No adjacent disc or bone degeneration or spinal stenosis. The MRI report states that the cord appears normal. -VRP  10/08/03 MRI cervical spine  (images reviewed) - Satisfactory anterior plate fusion of C4 and C5.Disk degeneration and spondylosis at C3-4 and C5-6 with biforaminal narrowing but no significant central canal stenosis. There is no disk protrusion.   11/06/11 MRI brain (report only) - "subtle area of increased T2 intensity in the right posterior upper medulla likely representing old infarct or possibly area of previous inflammation. Otherwise normal MRI of the brain."  12/04/13 MRI brain (with and without) demonstrating: 1. Few punctate foci of right frontal subcortical and periventricular non-specific gliosis. These findings are non-specific and considerations include autoimmune, inflammatory, post-infectious, microvascular ischemic or migraine associated etiologies.  2. No abnormal enhancing lesions. No acute findings.  12/04/13 MRI cervical spine (with and without) demonstrating: 1. The spinal cord is notable for 3 subtle T2 hyperintense lesions: C5-6 on the left, C7 on the right, T2 slightly on the right of midline. No abnormal enhancing lesions. Considerations include autoimmune, inflammatory or post-infectious etiologies. No adjacent disc disease to suggest mechanical compressive etiologies.  2. At C6-7: left uncovertebral joint hypertrophy with moderate left foraminal stenosis. 3. Compared to prior MRI on 10/08/03, the C5-6 lesion appears new; the C7 and T2 spinal cord lesions appear stable. Also, there has been change in ACDF levels (previously from C4-C5; now C3-C6).   12/04/13 MRI thoracic spine (with and without) - unremarkable  12/29/13 LP - WBC 1, RBC 0, protein 26, glucose 59, OCB > 5, gram stain neg  09/28/14 MRI brain [I reviewed images myself and agree with interpretation. -VRP]  1. There is a small T2/FLAIR hyperintense focus in the right frontal lobe. This is nonspecific and could be idiopathic or represent a small focus of chronic microvascular ischemic change or demyelination.. The extent is within a  normal range for her age. 2. There are no enhancing lesions or acute findings 3. Compared to the MRI dated 12/04/2013, there is no definite interval change.   01/10/14 anti-JCV ab - 0.10 Negative  05/22/14 anti-JCV ab - 0.09 Negative     ASSESSMENT AND PLAN  69 y.o. year old female here with bilateral lower extremity spasticity, weakness,with brisk reflexes in upper and lower extremities, left Hoffman sign, bilateral upgoing toes. MRIs from 2005 and 2015 demonstrate multiple spinal cord lesions. Now suspect demyelinating disease, with first attack in 2002, second attack in 2003, and 3rd attack in 2014.  Dx: multiple sclerosis    PLAN: - continue tysabri - repeat MRI brain and JCV ab q6 months (  next in March 2017) - continue ampyra (walking speed and balance have improved; T25W: 46s --> 25s after ampyra) - compounded neuropathy cream for foot / calf pain  Orders Placed This Encounter  Procedures  . CBC with Differential/Platelet  . Comprehensive metabolic panel  . Stratify JCV Antibody Test ([facility])  . VITAMIN D 25 Hydroxy (Vit-D Deficiency, Fractures)   Return in about 3 months (around 04/03/2015).    Penni Bombard, MD [id number], [id number] PM Certified in Neurology, Neurophysiology and Neuroimaging  [facility] [address], [place], [place] [address] [phone]

## 2015-01-03 NOTE — Patient Instructions (Signed)
Thank you for coming to see [place] at [facility]. I hope we have been able to provide you high quality care today.  You may receive a patient satisfaction survey over the next few weeks. We would appreciate your feedback and comments so that we may continue to improve ourselves and the health of our patients.  - try compounded neuropathy cream - continue other medications   ~~~~~~~~~~~~~~~~~~~~~~~~~~~~~~~~~~~~~~~~~~~~~~~~~~~~~~~~~~~~~~~~~  DR. Jatavia Keltner'S GUIDE TO HAPPY AND HEALTHY LIVING These are some of my general health and wellness recommendations. Some of them may apply to you better than others. Please use common sense as you try these suggestions and feel free to ask me any questions.   ACTIVITY/FITNESS Mental, social, emotional and physical stimulation are very important for brain and body health. Try learning a new activity (arts, music, language, sports, games).  Keep moving your body to the best of your abilities. You can do this at home, inside or outside, the park, community center, gym or anywhere you like. Consider a physical therapist or personal trainer to get started. Consider the app Sworkit. Fitness trackers such as smart-watches, smart-phones or Fitbits can help as well.   NUTRITION Eat more plants: colorful vegetables, nuts, seeds and berries.  Eat less sugar, salt, preservatives and processed foods.  Avoid toxins such as cigarettes and alcohol.  Drink water when you are thirsty. Warm water with a slice of lemon is an excellent morning drink to start the day.  Consider these websites for more information The Nutrition Source (https://www.henry-hernandez.biz/) [employer] (WindowBlog.ch)   RELAXATION Consider practicing mindfulness meditation or other relaxation techniques such as deep breathing, prayer, yoga, tai chi, massage. See website mindful.org or the apps Headspace or Calm to help get  started.   SLEEP Try to get at least 7-8+ hours sleep per day. Regular exercise and reduced caffeine will help you sleep better. Practice good sleep hygeine techniques. See website sleep.org for more information.   PLANNING Prepare estate planning, living will, healthcare POA documents. Sometimes this is best planned with the help of an attorney. Theconversationproject.org and agingwithdignity.org are excellent resources.

## 2015-01-04 DIAGNOSIS — N3941 Urge incontinence: Secondary | ICD-10-CM | POA: Diagnosis not present

## 2015-01-04 LAB — COMPREHENSIVE METABOLIC PANEL
ALT: 11 IU/L (ref 0–32)
AST: 13 IU/L (ref 0–40)
Albumin/Globulin Ratio: 2.3 (ref 1.1–2.5)
Albumin: 4.4 g/dL (ref 3.6–4.8)
Alkaline Phosphatase: 40 IU/L (ref 39–117)
BUN/Creatinine Ratio: 39 — ABNORMAL HIGH (ref 11–26)
BUN: 22 mg/dL (ref 8–27)
Bilirubin Total: 0.2 mg/dL (ref 0.0–1.2)
CO2: 30 mmol/L — ABNORMAL HIGH (ref 18–29)
Calcium: 9.7 mg/dL (ref 8.7–10.3)
Chloride: 101 mmol/L (ref 96–106)
Creatinine, Ser: 0.57 mg/dL (ref 0.57–1.00)
GFR calc Af Amer: 110 mL/min/{1.73_m2} (ref >59)
GFR calc non Af Amer: 96 mL/min/{1.73_m2} (ref >59)
Globulin, Total: 1.9 g/dL (ref 1.5–4.5)
Glucose: 93 mg/dL (ref 65–99)
Potassium: 5.1 mmol/L (ref 3.5–5.2)
Sodium: 143 mmol/L (ref 134–144)
Total Protein: 6.3 g/dL (ref 6.0–8.5)

## 2015-01-04 LAB — CBC WITH DIFFERENTIAL/PLATELET
Basophils Absolute: 0 10*3/uL (ref 0.0–0.2)
Basos: 0 %
EOS (ABSOLUTE): 0.1 10*3/uL (ref 0.0–0.4)
Eos: 1 %
Hematocrit: 37.7 % (ref 34.0–46.6)
Hemoglobin: 12.4 g/dL (ref 11.1–15.9)
Immature Grans (Abs): 0.1 10*3/uL (ref 0.0–0.1)
Immature Granulocytes: 1 %
Lymphocytes Absolute: 3 10*3/uL (ref 0.7–3.1)
Lymphs: 30 %
MCH: 31.4 pg (ref 26.6–33.0)
MCHC: 32.9 g/dL (ref 31.5–35.7)
MCV: 95 fL (ref 79–97)
Monocytes Absolute: 0.9 10*3/uL (ref 0.1–0.9)
Monocytes: 9 %
Neutrophils Absolute: 5.8 10*3/uL (ref 1.4–7.0)
Neutrophils: 59 %
Platelets: 331 10*3/uL (ref 150–379)
RBC: 3.95 x10E6/uL (ref 3.77–5.28)
RDW: 14.3 % (ref 12.3–15.4)
WBC: 9.8 10*3/uL (ref 3.4–10.8)

## 2015-01-04 LAB — VITAMIN D 25 HYDROXY (VIT D DEFICIENCY, FRACTURES): Vit D, 25-Hydroxy: 36.8 ng/mL (ref 30.0–100.0)

## 2015-01-16 DIAGNOSIS — G35 Multiple sclerosis: Secondary | ICD-10-CM | POA: Diagnosis not present

## 2015-01-17 ENCOUNTER — Telehealth: Payer: Self-pay | Admitting: *Deleted

## 2015-01-17 NOTE — Telephone Encounter (Signed)
Spoke with patient and informed her JCV lab results are negative. She inquired about her Vit D, informed her it is within normal limits. She stated she is taking Vit D 2000 units daily. Informed her this is fine. She verbalized understanding of call and appreciation.

## 2015-02-01 DIAGNOSIS — N3941 Urge incontinence: Secondary | ICD-10-CM | POA: Diagnosis not present

## 2015-02-01 DIAGNOSIS — R350 Frequency of micturition: Secondary | ICD-10-CM | POA: Diagnosis not present

## 2015-02-01 DIAGNOSIS — R3915 Urgency of urination: Secondary | ICD-10-CM | POA: Diagnosis not present

## 2015-02-06 ENCOUNTER — Telehealth: Payer: Self-pay | Admitting: Diagnostic Neuroimaging

## 2015-02-06 NOTE — Telephone Encounter (Signed)
Pt called said she is having a difficult time going to sleep for about several months. She said she got about 2 hrs sleep last night. She is inquiring if something could be called to [facility] on [address]

## 2015-02-06 NOTE — Telephone Encounter (Signed)
Spoke with patient who stated she has never taken any prescription medications to help her sleep. She stated she has tried one OTC without good results, cannot remember the name. Advised she try OTC such as Melatonin, and discuss with her pharmacist which other OTC sleep aids she can safely take. Informed her this RN will route her question to Dr Leta Baptist and call her back if he prescribes sleep aid and/or has other instructions for her. Reviewed good sleep hygiene practices with her. She stated she follows them well. She verbalized understanding of above.

## 2015-02-06 NOTE — Telephone Encounter (Signed)
Stay with conservative sleep hygeine and OTC meds for now. -VRP

## 2015-02-07 NOTE — Telephone Encounter (Signed)
Spoke with patient and informed her that Dr Elgie Congo agreed with previous conversation/plan. She stated she would see pharmacist today.  Advised she call back with future needs, questions. Amy Bray verbalized understanding, appreciation.

## 2015-02-13 DIAGNOSIS — G35 Multiple sclerosis: Secondary | ICD-10-CM | POA: Diagnosis not present

## 2015-02-16 DIAGNOSIS — Z79899 Other long term (current) drug therapy: Secondary | ICD-10-CM | POA: Diagnosis not present

## 2015-02-16 DIAGNOSIS — G894 Chronic pain syndrome: Secondary | ICD-10-CM | POA: Diagnosis not present

## 2015-02-16 DIAGNOSIS — M5137 Other intervertebral disc degeneration, lumbosacral region: Secondary | ICD-10-CM | POA: Diagnosis not present

## 2015-02-20 ENCOUNTER — Telehealth: Payer: Self-pay | Admitting: *Deleted

## 2015-02-20 MED ORDER — BACLOFEN 10 MG PO TABS: 10.0000 mg | ORAL_TABLET | Freq: Four times a day (QID) | ORAL | Status: DC | PRN

## 2015-02-20 NOTE — Telephone Encounter (Signed)
Try melatonin. May increase baclofen for spasms. -VRP

## 2015-02-20 NOTE — Telephone Encounter (Signed)
Spoke with patient and informed her that Dr Leta Baptist recommends she increase Baclofen. She is currently taking 10 mg bid prn. She requested to increase to qid, stated she was on that dose before. Advised she try Melatonin for sleep per Dr Leta Baptist. New script for baclofen approved by Dr Leta Baptist and sent to [facility] at her request. Pt verbalized understanding, appreciation.

## 2015-02-20 NOTE — Telephone Encounter (Signed)
Spoke with patient re: Ampyra approved through pt assistance program x 1 year. She states she was aware.  Patient stated she is still not sleeping well, cannot go to sleep until 3-4 am then sleeps till noon the next day. She states she has "spasms and at times her mind " keeps her awake. She stated she has taken Z quill and Unisom without good results. This RN suggested Melatonin, and patient stated she would try if Dr Leta Baptist does not want to prescribe at this point. Otherwise she is requesting prescription medication to help her sleep. Informed her would discuss with dr and call her back with his response. She verbalized understanding, appreciation.

## 2015-02-26 DIAGNOSIS — L812 Freckles: Secondary | ICD-10-CM | POA: Diagnosis not present

## 2015-02-26 DIAGNOSIS — L821 Other seborrheic keratosis: Secondary | ICD-10-CM | POA: Diagnosis not present

## 2015-02-26 DIAGNOSIS — D1801 Hemangioma of skin and subcutaneous tissue: Secondary | ICD-10-CM | POA: Diagnosis not present

## 2015-02-26 DIAGNOSIS — Z872 Personal history of diseases of the skin and subcutaneous tissue: Secondary | ICD-10-CM | POA: Diagnosis not present

## 2015-03-01 DIAGNOSIS — N3941 Urge incontinence: Secondary | ICD-10-CM | POA: Diagnosis not present

## 2015-03-01 DIAGNOSIS — R350 Frequency of micturition: Secondary | ICD-10-CM | POA: Diagnosis not present

## 2015-03-01 DIAGNOSIS — R3915 Urgency of urination: Secondary | ICD-10-CM | POA: Diagnosis not present

## 2015-03-08 ENCOUNTER — Telehealth: Payer: Self-pay | Admitting: Diagnostic Neuroimaging

## 2015-03-08 NOTE — Telephone Encounter (Signed)
Pt called sts the RX for nortriptyline (PAMELOR) 50 MG capsule that she got from [place] has expired. Does provider want her to continue with this medication and if so please send to [facility] on [address]

## 2015-03-09 NOTE — Telephone Encounter (Signed)
I called and pt not available.  I spoke to husband,  And he would give her the message.  If taking for pamelor for depression then would need to see pcp for refill.  He was not sure.  We did not prescribe.

## 2015-03-12 DIAGNOSIS — G35 Multiple sclerosis: Secondary | ICD-10-CM | POA: Diagnosis not present

## 2015-03-12 NOTE — Telephone Encounter (Signed)
Spoke with patient and informed her that per Dr Leta Baptist, she may taper off pamelor . She stated she has been off medication 2-3 days. She stated she is not experiencing any issues except "burning feet" which is not a new problem. Per Dr Leta Baptist, she does not need to have pamelor prescribed again unless she experiences problems. She then stated the compounded cream he prescribed for her feet was not covered by her insurance and cost too much. She is requesting another medication to help with her burning feet. Informed her would route her request to Dr Leta Baptist and call her tomorrow. She verbalized understanding.

## 2015-03-12 NOTE — Telephone Encounter (Signed)
Patient in office receiving infusion. She stated she is not sure if Dr Leta Baptist wants her to stay on pamelor. If so, she needs him to refill.  She stated she takes one tab nightly, has been on it maybe 10 years, and does not remember why  She was prescribed it to begin with. She denies being depressed and stated "It doesn't help me sleep".  She has not been going to [facility] for neuro care for a while now. Informed her this RN will discuss with dr and call her back. She verbalized understanding, appreciation.

## 2015-03-12 NOTE — Telephone Encounter (Signed)
It was probably for pain control and anxiety/insomnia. If she doesn't think it is necessary, then we can taper it off (25mg  daily x 2 weeks, then 10mg  daily x 2 weeks, then stop).  -VRP

## 2015-03-12 NOTE — Telephone Encounter (Signed)
Lidocaine 5% cream. -VRP

## 2015-03-13 NOTE — Telephone Encounter (Signed)
Spoke with patient and informed her Dr Leta Baptist prescribed Lidocaine cream; she will receive it in the mail. She verbalized understanding, appreciation.

## 2015-03-19 ENCOUNTER — Encounter: Payer: Self-pay | Admitting: *Deleted

## 2015-03-29 DIAGNOSIS — R350 Frequency of micturition: Secondary | ICD-10-CM | POA: Diagnosis not present

## 2015-03-29 DIAGNOSIS — R3915 Urgency of urination: Secondary | ICD-10-CM | POA: Diagnosis not present

## 2015-03-29 DIAGNOSIS — N3941 Urge incontinence: Secondary | ICD-10-CM | POA: Diagnosis not present

## 2015-04-02 ENCOUNTER — Encounter: Payer: Self-pay | Admitting: Diagnostic Neuroimaging

## 2015-04-02 ENCOUNTER — Ambulatory Visit (INDEPENDENT_AMBULATORY_CARE_PROVIDER_SITE_OTHER): Payer: Medicare Other | Admitting: Diagnostic Neuroimaging

## 2015-04-02 VITALS — BP 105/73 | HR 113 | Ht 61.0 in | Wt 115.0 lb

## 2015-04-02 DIAGNOSIS — R258 Other abnormal involuntary movements: Secondary | ICD-10-CM | POA: Diagnosis not present

## 2015-04-02 DIAGNOSIS — R252 Cramp and spasm: Secondary | ICD-10-CM

## 2015-04-02 DIAGNOSIS — G35 Multiple sclerosis: Secondary | ICD-10-CM

## 2015-04-02 DIAGNOSIS — G959 Disease of spinal cord, unspecified: Secondary | ICD-10-CM

## 2015-04-02 NOTE — Progress Notes (Signed)
[facility]  PATIENT: Amy Bray DOB: 09-20-1945  REFERRING CLINICIAN:  HISTORY FROM: patient and husband  REASON FOR VISIT: follow up   HISTORICAL  CHIEF COMPLAINT:  Chief Complaint  Patient presents with  . Multiple Sclerosis    rm 6, husband- Lanetta Inch, 09/2014 MRI brain, 12/2014 JCV -neg, "ringing in my ears; walking/balance-some days are good, sone aren't"  . Follow-up    HISTORY OF PRESENT ILLNESS:   UPDATE 04/02/15: Since last visit, doing well. Tolerating tysabri and ampyra. Back on nortriptyline (is helping burning pain in feet).   UPDATE 01/03/15: Since last visit, doing better on ampyra (better gait and walking speed). More foot/calf tingling burning pain x 1-2 months. Now with Dr. Andree Elk pain mgmt for back injections x 2, which is helping.  UPDATE 10/02/14: Since last visit, doing well. Requesting transition of care to [facility] for pain mgmt; previously at [facility] with Dr. Oliva Bustard.  UPDATE 05/22/14: Since last visit, doing well. On tysabri since March 2016. Fatigue and bladder control much improved. Gait is stable.   UPDATE 02/20/14: Since last visit, had reviewed lab results and now ready to start MS medication. Now would like to start tysabri.  UPDATE 01/09/14: Since last visit, no new events symptoms. LP results reviewed. Didn't get lab work at last visit for MS mimics.   UPDATE 12/07/13: Patient is stable with symptoms. MRI scans reviewed. Continues to have weakness in legs and balance difficulty.  UPDATE 11/21/13: 70 year old right-handed female here for evaluation of lower extremity spasms and weakness. 2002 patient developed onset of right leg stiffness, spasm, weakness. Patient was diagnosed with cervical spine disease, bone spur ridging, and treated with anterior cervical discectomy and fusion in 2003. Following this her right leg weakness and spasms significantly improved. However one year later her symptoms return. Patient followed up with  several surgeons and ultimately had a second cervical spine surgery in 2006. Following this patient had slight improvement of right leg however within several months her weakness, spasms and gait continued to decline. Patient transitioned needing a cane to walk. 2013 patient was evaluated by a neurologist in [place], to evaluate her right leg weakness. Apparently patient had a "multiple sclerosis" workup including MRI of the brain and extensive blood testing. Patient also had EMG nerve conduction studies looking for peroneal neuropathy but apparently all studies were normal. MRI of the brain did show a right medulla lesion which was reported as a stroke. Over the past 1 year patient has developed new onset left leg weakness, spasm, stiffness. Patient followed up with orthopedic surgery who then referred patient to me for evaluation. Last MRI of cervical spine was in 2006 at [facility]. No bowel or bladder incontinence. No significant numbness or tingling. No weakness in upper extremities. No vision loss, vision changes, slurred speech or trouble talking.   REVIEW OF SYSTEMS: Full 14 system review of systems performed and negative except for ringing in ears freq urination walking diff restless legs.    ALLERGIES: No Known Allergies  HOME MEDICATIONS: Outpatient Prescriptions Prior to Visit  Medication Sig Dispense Refill  . acetaminophen (TYLENOL) 325 MG tablet Take by mouth.    Marland Kitchen alendronate (FOSAMAX) 70 MG tablet Take 1 tablet by mouth once a week.    . baclofen (LIORESAL) 10 MG tablet Take 1 tablet (10 mg total) by mouth 4 (four) times daily as needed for muscle spasms. 120 each 6  . dalfampridine (AMPYRA) 10 MG TB12 Take 1 tablet (10 mg  total) by mouth 2 (two) times daily. 60 tablet 6  . lidocaine (XYLOCAINE) 5 % ointment Apply 1 application topically as needed. 03/13/15 prescription faxed to [facility]    . lisinopril (PRINIVIL,ZESTRIL) 10 MG tablet Take 1 tablet by mouth daily.    .  natalizumab (TYSABRI) 300 MG/15ML injection Inject into the vein.    Marland Kitchen nortriptyline (PAMELOR) 50 MG capsule Take 1 capsule by mouth daily.    . traMADol-acetaminophen (ULTRACET) 37.5-325 MG per tablet Take 1 tablet by mouth daily as needed.    . hydrOXYzine (ATARAX/VISTARIL) 10 MG tablet Take 2 tablets by mouth daily as needed.     No facility-administered medications prior to visit.    PAST MEDICAL HISTORY: Past Medical History  Diagnosis Date  . Cervical myelopathy ([place])     PAST SURGICAL HISTORY: Past Surgical History  Procedure Laterality Date  . Cervical fusion  01/2001, 01/2004    C3-C6    FAMILY HISTORY: Family History  Problem Relation Age of Onset  . Transient ischemic attack Mother   . Pancreatic cancer Father     SOCIAL HISTORY:  Social History   Social History  . Marital Status: Married    Spouse Name: Huntlee Winsett  . Number of Children: 0  . Years of Education: College   Occupational History  .  Other    n/a   Social History Main Topics  . Smoking status: Former Smoker -- 1.00 packs/day for 15 years    Types: Cigarettes    Quit date: 01/21/1983  . Smokeless tobacco: Never Used  . Alcohol Use: 0.0 oz/week    0 Standard drinks or equivalent per week     Comment: occasionally- wine  . Drug Use: No  . Sexual Activity: Not on file   Other Topics Concern  . Not on file   Social History Narrative   Patient lives at home with her spouse.   Caffeine Use-none     PHYSICAL EXAM  Filed Vitals:   04/02/15 1338  BP: 105/73  Pulse: 113  Height: 5\' 1"  (1.549 m)  Weight: 115 lb (52.164 kg)    Not recorded     No exam data present   Body mass index is 21.74 kg/(m^2).  GENERAL EXAM: Patient is in no distress; well developed, nourished and groomed; neck is supple  CARDIOVASCULAR: Regular rate and rhythm, no murmurs, no carotid bruits  NEUROLOGIC: MENTAL STATUS: awake, alert, language fluent, comprehension intact, naming intact, fund of  knowledge appropriate CRANIAL NERVE: pupils equal and reactive to light, visual fields full to confrontation, extraocular muscles intact, no nystagmus, facial sensation and strength symmetric, hearing intact, palate elevates symmetrically, uvula midline, shoulder shrug symmetric, tongue midline. MOTOR: normal bulk and tone in BUE; MILD INCREASED EXT TONE IN BLE; BLE (HF 4, KE 5 KF 4 DF 4) SENSORY: DECR VIB AT TOES  COORDINATION: finger-nose-finger, fine finger movements --> MINIMAL ACTION TREMOR IN BUE REFLEXES: BUE 3; BLE (KNEES 3, ANKLES 2) GAIT/STATION: SPASTIC, PARAPARETIC GAIT; UNSTEADY; USES SINGLE POINT CANE      DIAGNOSTIC DATA (LABS, IMAGING, TESTING) - I reviewed patient records, labs, notes, testing and imaging myself where available.  Lab Results  Component Value Date   WBC 9.8 01/03/2015   HCT 37.7 01/03/2015   MCV 95 01/03/2015   PLT 331 01/03/2015      Component Value Date/Time   NA 143 01/03/2015 1427   K 5.1 01/03/2015 1427   CL 101 01/03/2015 1427   CO2 30* 01/03/2015  1427   GLUCOSE 93 01/03/2015 1427   BUN 22 01/03/2015 1427   CREATININE 0.57 01/03/2015 1427   CALCIUM 9.7 01/03/2015 1427   PROT 6.3 01/03/2015 1427   ALBUMIN 4.4 01/03/2015 1427   AST 13 01/03/2015 1427   ALT 11 01/03/2015 1427   ALKPHOS 40 01/03/2015 1427   BILITOT 0.2 01/03/2015 1427   GFRNONAA 96 01/03/2015 1427   GFRAA 110 01/03/2015 1427   No results found for: CHOL, HDL, LDLCALC, LDLDIRECT, TRIG, CHOLHDL No results found for: HGBA1C No results found for: VITAMINB12 No results found for: TSH  VIT D, 25-HYDROXY  Date Value Ref Range Status  01/03/2015 36.8 30.0 - 100.0 ng/mL Final    Comment:    Vitamin D deficiency has been defined by the [place] practice guideline as a level of serum 25-OH vitamin D less than 20 ng/mL (1,2). The Endocrine Society went on to further define vitamin D insufficiency as a level between 21 and 29 ng/mL  (2). 1. IOM (Institute of Medicine). 2010. Dietary reference    intakes for calcium and D. [place]: The    [employer]. 2. Holick MF, [place], Bischoff-Ferrari HA, et al.    Evaluation, treatment, and prevention of vitamin D    deficiency: an Endocrine Society clinical practice    guideline. JCEM. 2011 Jul; 96(7):1911-30.     I reviewed images myself. In my review, I think there is a 2cm spinal cord lesion (right side, C6-C7) and a smaller left T1 lesion, suspicious for demyelinating disease, on the MRI cervical spine from 10/08/03. No adjacent disc or bone degeneration or spinal stenosis. The MRI report states that the cord appears normal. -VRP  10/08/03 MRI cervical spine (images reviewed) - Satisfactory anterior plate fusion of C4 and C5.Disk degeneration and spondylosis at C3-4 and C5-6 with biforaminal narrowing but no significant central canal stenosis. There is no disk protrusion.   11/06/11 MRI brain (report only) - "subtle area of increased T2 intensity in the right posterior upper medulla likely representing old infarct or possibly area of previous inflammation. Otherwise normal MRI of the brain."  12/04/13 MRI brain (with and without) demonstrating: 1. Few punctate foci of right frontal subcortical and periventricular non-specific gliosis. These findings are non-specific and considerations include autoimmune, inflammatory, post-infectious, microvascular ischemic or migraine associated etiologies.  2. No abnormal enhancing lesions. No acute findings.  12/04/13 MRI cervical spine (with and without) demonstrating: 1. The spinal cord is notable for 3 subtle T2 hyperintense lesions: C5-6 on the left, C7 on the right, T2 slightly on the right of midline. No abnormal enhancing lesions. Considerations include autoimmune, inflammatory or post-infectious etiologies. No adjacent disc disease to suggest mechanical compressive etiologies.  2. At C6-7: left uncovertebral  joint hypertrophy with moderate left foraminal stenosis. 3. Compared to prior MRI on 10/08/03, the C5-6 lesion appears new; the C7 and T2 spinal cord lesions appear stable. Also, there has been change in ACDF levels (previously from C4-C5; now C3-C6).   12/04/13 MRI thoracic spine (with and without) - unremarkable  12/29/13 LP - WBC 1, RBC 0, protein 26, glucose 59, OCB > 5, gram stain neg  09/28/14 MRI brain [I reviewed images myself and agree with interpretation. -VRP]  1. There is a small T2/FLAIR hyperintense focus in the right frontal lobe. This is nonspecific and could be idiopathic or represent a small focus of chronic microvascular ischemic change or demyelination.. The extent is within a normal range  for her age. 2. There are no enhancing lesions or acute findings 3. Compared to the MRI dated 12/04/2013, there is no definite interval change.   Anti-JCV antibody 01/10/14 - 0.10 Negative  05/22/14 - 0.09 Negative 12/2014 - negative     ASSESSMENT AND PLAN  70 y.o. year old female here with bilateral lower extremity spasticity, weakness,with brisk reflexes in upper and lower extremities, left Hoffman sign, bilateral upgoing toes. MRIs from 2005 and 2015 demonstrate multiple spinal cord lesions. Now suspect demyelinating disease, with first attack in 2002, second attack in 2003, and 3rd attack in 2014.  Dx: multiple sclerosis   Multiple sclerosis ([place])  Cervical myelopathy ([place])  Spasticity    PLAN: - continue tysabri - repeat MRI brain and JCV ab q6 months - continue ampyra (walking speed and balance have improved; T25W: 46s --> 25s after ampyra)  Orders Placed This Encounter  Procedures  . For home use only DME 4 wheeled rolling walker with seat  . MR Brain W Wo Contrast  . Stratify JCV Antibody Test ([facility])  . CBC with Differential/Platelet  . Comprehensive metabolic panel   Return in about 3 months (around 07/03/2015).    Penni Bombard, MD [id number],  [id number] PM Certified in Neurology, Neurophysiology and Neuroimaging  [facility] [address] [place], [place] [address] [phone]

## 2015-04-02 NOTE — Patient Instructions (Signed)
-  continue current medications

## 2015-04-03 ENCOUNTER — Telehealth: Payer: Self-pay | Admitting: *Deleted

## 2015-04-03 LAB — CBC WITH DIFFERENTIAL/PLATELET
Basophils Absolute: 0 10*3/uL (ref 0.0–0.2)
Basos: 1 %
EOS (ABSOLUTE): 0.1 10*3/uL (ref 0.0–0.4)
Eos: 2 %
Hematocrit: 37.5 % (ref 34.0–46.6)
Hemoglobin: 12.7 g/dL (ref 11.1–15.9)
Immature Grans (Abs): 0 10*3/uL (ref 0.0–0.1)
Immature Granulocytes: 1 %
Lymphocytes Absolute: 2.4 10*3/uL (ref 0.7–3.1)
Lymphs: 36 %
MCH: 31.8 pg (ref 26.6–33.0)
MCHC: 33.9 g/dL (ref 31.5–35.7)
MCV: 94 fL (ref 79–97)
Monocytes Absolute: 0.5 10*3/uL (ref 0.1–0.9)
Monocytes: 8 %
Neutrophils Absolute: 3.6 10*3/uL (ref 1.4–7.0)
Neutrophils: 52 %
Platelets: 299 10*3/uL (ref 150–379)
RBC: 3.99 x10E6/uL (ref 3.77–5.28)
RDW: 13.9 % (ref 12.3–15.4)
WBC: 6.7 10*3/uL (ref 3.4–10.8)

## 2015-04-03 LAB — COMPREHENSIVE METABOLIC PANEL
ALT: 18 IU/L (ref 0–32)
AST: 16 IU/L (ref 0–40)
Albumin/Globulin Ratio: 2.1 (ref 1.2–2.2)
Albumin: 4.2 g/dL (ref 3.6–4.8)
Alkaline Phosphatase: 36 IU/L — ABNORMAL LOW (ref 39–117)
BUN/Creatinine Ratio: 20 (ref 11–26)
BUN: 14 mg/dL (ref 8–27)
Bilirubin Total: <0.2 mg/dL (ref 0.0–1.2)
CO2: 27 mmol/L (ref 18–29)
Calcium: 9.7 mg/dL (ref 8.7–10.3)
Chloride: 101 mmol/L (ref 96–106)
Creatinine, Ser: 0.69 mg/dL (ref 0.57–1.00)
GFR calc Af Amer: 103 mL/min/{1.73_m2} (ref >59)
GFR calc non Af Amer: 90 mL/min/{1.73_m2} (ref >59)
Globulin, Total: 2 g/dL (ref 1.5–4.5)
Glucose: 122 mg/dL — ABNORMAL HIGH (ref 65–99)
Potassium: 3.8 mmol/L (ref 3.5–5.2)
Sodium: 144 mmol/L (ref 134–144)
Total Protein: 6.2 g/dL (ref 6.0–8.5)

## 2015-04-03 NOTE — Telephone Encounter (Signed)
LVM informing patient her labs are unremarkable, glucose slightly elevated but it was not a fasting glucose. Left this caller's name, number for questions.

## 2015-04-09 DIAGNOSIS — G35 Multiple sclerosis: Secondary | ICD-10-CM | POA: Diagnosis not present

## 2015-04-10 ENCOUNTER — Other Ambulatory Visit: Payer: Self-pay | Admitting: *Deleted

## 2015-04-10 DIAGNOSIS — G35 Multiple sclerosis: Secondary | ICD-10-CM

## 2015-04-10 DIAGNOSIS — R269 Unspecified abnormalities of gait and mobility: Secondary | ICD-10-CM

## 2015-04-11 ENCOUNTER — Ambulatory Visit
Admission: RE | Admit: 2015-04-11 | Discharge: 2015-04-11 | Disposition: A | Discharge status: Home / Self Care | Payer: Medicare Other | Source: Ambulatory Visit | Attending: Diagnostic Neuroimaging | Admitting: Diagnostic Neuroimaging

## 2015-04-11 DIAGNOSIS — G35 Multiple sclerosis: Secondary | ICD-10-CM

## 2015-04-11 DIAGNOSIS — G959 Disease of spinal cord, unspecified: Secondary | ICD-10-CM | POA: Diagnosis not present

## 2015-04-11 DIAGNOSIS — R258 Other abnormal involuntary movements: Secondary | ICD-10-CM | POA: Diagnosis not present

## 2015-04-11 DIAGNOSIS — R252 Cramp and spasm: Secondary | ICD-10-CM

## 2015-04-17 ENCOUNTER — Ambulatory Visit: Payer: Medicare Other | Attending: Diagnostic Neuroimaging | Admitting: Physical Therapy

## 2015-04-17 DIAGNOSIS — R269 Unspecified abnormalities of gait and mobility: Secondary | ICD-10-CM | POA: Insufficient documentation

## 2015-04-17 DIAGNOSIS — M6281 Muscle weakness (generalized): Secondary | ICD-10-CM | POA: Insufficient documentation

## 2015-04-17 NOTE — Therapy (Signed)
[facility] [address] [place], [place], [address] Phone: [phone]   Fax:  [phone]  Physical Therapy Evaluation  Patient Details  Name: Amy Bray MRN: [id number] Date of Birth: 1945/10/11 Referring Provider: Penni Bombard  Encounter Date: 04/17/2015      PT End of Session - 04/17/15 0916    Visit Number 1   Number of Visits 16   Date for PT Re-Evaluation 06/12/15   Authorization Type Medicare/AARP   PT Start Time 0830   PT Stop Time 0906   PT Time Calculation (min) 36 min   Equipment Utilized During Treatment Gait belt   Activity Tolerance Patient tolerated treatment well   Behavior During Therapy [facility] for tasks assessed/performed      Past Medical History  Diagnosis Date  . Cervical myelopathy [facility])     Past Surgical History  Procedure Laterality Date  . Cervical fusion  01/2001, 01/2004    C3-C6    There were no vitals filed for this visit.  Visit Diagnosis:  Abnormality of gait - Plan: PT plan of care cert/re-cert  Muscle weakness (generalized) - Plan: PT plan of care cert/re-cert      Subjective Assessment - 04/17/15 0835    Subjective Pt is a 70 y/o female who presents to OPPT with diagnosis of MS (diagnosed in Dec 2015; reports hx of symptoms x 10 years) presenting today with difficulty walking and imbalance.  Pt requesting to come to PT to improve function and strength.   Pertinent History MS, ACDF x 2   Limitations Standing;Walking   Patient Stated Goals improve balance; strength, gait.  "help me pick up my feet better."   Currently in Pain? Yes   Pain Score 5    Pain Location Coccyx   Pain Orientation Lower   Pain Descriptors / Indicators Constant   Pain Onset More than a month ago   Pain Frequency Constant   Aggravating Factors  unknown   Pain Relieving Factors steroid injection q 3 months, medication            [facility] PT Assessment - 04/17/15 0840    Assessment   Medical Diagnosis MS   Referring Provider Andrey Spearman R   Onset Date/Surgical Date --  60 yr history   Next MD Visit 07/04/15   [facility]   Precautions   Precautions Fall   Required Braces or Orthoses Other Brace/Splint   Other Brace/Splint has walk aide on L   Restrictions   Weight Bearing Restrictions No   Balance Screen   Has the patient fallen in the past 6 months No   Has the patient had a decrease in activity level because of a fear of falling?  Yes   Is the patient reluctant to leave their home because of a fear of falling?  No   Home Environment   Living Environment Private residence   Living Arrangements Spouse/significant other   Type of [place] to enter   Entrance Stairs-Number of Steps 1   [place] One level   [place] - single point;Grab bars - tub/shower   Prior Function   Level of Independence Requires assistive device for independence   Vocation Retired   Clinical research associate   Overall Cognitive Status Within Functional Limits for tasks assessed   Strength   Overall Strength Comments tested in sitting; suspect hip abdct and  ext weakness bil R>L given gait abnormalities   Strength Assessment Site Hip;Knee;Ankle   Right Hip Flexion 3/5   Left Hip Flexion 3+/5   Right Knee Flexion 2/5   Right Knee Extension 3+/5   Left Knee Flexion 3/5   Left Knee Extension 4-/5   Right Ankle Dorsiflexion 2/5   Left Ankle Dorsiflexion 2/5   Transfers   Transfers Sit to Stand;Stand to Sit   Sit to Stand 5: Supervision;With upper extremity assist;From chair/3-in-1;With armrests   Sit to Stand Details (indicate cue type and reason) decreased initiation   Stand to Sit 5: Supervision;With armrests;Uncontrolled descent   Ambulation/Gait   Ambulation/Gait Yes   Ambulation/Gait Assistance 4: Min assist;4: Min guard   Ambulation/Gait Assistance Details min A with SPC;  minguard A with RW   Ambulation Distance (Feet) 100 Feet   Assistive device Straight cane;Rolling walker   Gait Pattern Poor foot clearance - right;Poor foot clearance - left;Scissoring;Lateral hip instability;Trendelenburg;Right genu recurvatum;Decreased dorsiflexion - right;Decreased dorsiflexion - left;Step-to pattern;Decreased stance time - right;Decreased step length - left   Ambulation Surface Level;Indoor   Gait velocity 0.63 ft/sec  52.28 sec with SPC   Gait Comments recommend RW at all times as pt high fall risk with [facility]                           PT Education - 04/17/15 0915    Education provided Yes   Education Details recommendation to use RW; goals of care; POC; benefits of AFOs   Person(s) Educated Patient   Methods Explanation   Comprehension Verbalized understanding          PT Short Term Goals - 04/17/15 0919    PT SHORT TERM GOAL #1   Title independent with HEP (05/15/15)   Status New   PT SHORT TERM GOAL #2   Title perform BERG balance test with LTG to be written (05/15/15)   Status New   PT SHORT TERM GOAL #3   Title improve gait velocity to > 1.0 ft/sec for improved mobility with LRAD (05/15/15)   PT SHORT TERM GOAL #4   Status New           PT Long Term Goals - 04/17/15 0920    PT LONG TERM GOAL #1   Title verbalize understanding of fall prevention strategies to decrease risk of reinjury (06/12/15)   Status New   PT LONG TERM GOAL #2   Title improve gait velocity to >/= 1.3 ft/sec for improved mobility (06/12/15)   Status New   PT LONG TERM GOAL #3   Title ambulate > 300' with LRAD modified independent for improved function and mobility on level/paved indoor/outdoor surfaces (06/12/15)   Status New   PT LONG TERM GOAL #4   Title BERG:   (06/12/15)   Status New               Plan - 04/17/15 0916    Clinical Impression Statement Pt is a 70 y/o female who presents to OPPT for moderate complexity evaluation with dx of MS.  Pt  is high risk for falls as indicated by gait velocity and need for min A with single point cane for ambulation.  Pt minguard A with RW and recommend pt use RW at all times.  Pt demonstrates decreased balance and strength affecting safe functional mobility.  Will benefit from PT to maximize function and address deficits listed.   Pt will benefit from  skilled therapeutic intervention in order to improve on the following deficits Abnormal gait;Decreased balance;Decreased safety awareness;Decreased mobility;Decreased strength;Pain;Decreased endurance;Decreased coordination  will not directly address pain   Rehab Potential Fair   PT Frequency 2x / week   PT Duration 8 weeks   PT Treatment/Interventions ADLs/Self Care Home Management;Electrical Stimulation;Moist Heat;Patient/family education;Neuromuscular re-education;Balance training;Therapeutic exercise;Therapeutic activities;Stair training;Functional mobility training;Gait training;DME Instruction;Orthotic Fit/Training;Energy conservation   PT Next Visit Plan HEP for LE strengthening; core strengthening; balance, gait with RW and trial AFO (pt to bring in her devices next session)   Consulted and Agree with Plan of Care Patient          G-Codes - 04-30-2015 [id number]    Functional Assessment Tool Used gait velocity 0.63 ft/sec   Functional Limitation Mobility: Walking and moving around   Mobility: Walking and Moving Around Current Status [phone]) At least 60 percent but less than 80 percent impaired, limited or restricted   Mobility: Walking and Moving Around Goal Status [phone]) At least 20 percent but less than 40 percent impaired, limited or restricted       Problem List Patient Active Problem List   Diagnosis Date Noted  . Muscle spasticity 10/31/2013  . Nerve root pain 01/31/2013  . Cervical myelopathy ([place]) 05/05/2011  . BP (high blood pressure) 05/05/2011  . Restless leg 05/05/2011  . Arthralgia, sacroiliac 05/05/2011   Laureen Abrahams, PT, DPT 04-30-2015 9:24 AM  [facility] [address] [place], [place], [address] Phone: [phone]   Fax:  [phone]  Name: JALEAHA HIGHT MRN: [id number] Date of Birth: 11/13/1945

## 2015-04-20 DIAGNOSIS — N3941 Urge incontinence: Secondary | ICD-10-CM | POA: Diagnosis not present

## 2015-04-20 DIAGNOSIS — Z0000 Encounter for general adult medical examination without abnormal findings: Secondary | ICD-10-CM | POA: Diagnosis not present

## 2015-04-25 ENCOUNTER — Ambulatory Visit: Payer: Medicare Other | Attending: Diagnostic Neuroimaging | Admitting: Physical Therapy

## 2015-04-25 DIAGNOSIS — R2689 Other abnormalities of gait and mobility: Secondary | ICD-10-CM | POA: Diagnosis not present

## 2015-04-25 DIAGNOSIS — M6281 Muscle weakness (generalized): Secondary | ICD-10-CM | POA: Insufficient documentation

## 2015-04-25 DIAGNOSIS — R269 Unspecified abnormalities of gait and mobility: Secondary | ICD-10-CM | POA: Diagnosis not present

## 2015-04-25 NOTE — Therapy (Signed)
[facility] [address] [place], [place], [address] Phone: [phone]   Fax:  [phone]  Physical Therapy Treatment  Patient Details  Name: Amy Bray MRN: [id number] Date of Birth: July 11, 1945 Referring Provider: Penni Bombard  Encounter Date: 04/25/2015      PT End of Session - 04/25/15 1216    Visit Number 2   Number of Visits 16   Date for PT Re-Evaluation 06/12/15   Authorization Type Medicare/AARP   PT Start Time 1102   PT Stop Time 1145   PT Time Calculation (min) 43 min   Activity Tolerance Patient tolerated treatment well   Behavior During Therapy [facility] for tasks assessed/performed      Past Medical History  Diagnosis Date  . Cervical myelopathy [facility])     Past Surgical History  Procedure Laterality Date  . Cervical fusion  01/2001, 01/2004    C3-C6    There were no vitals filed for this visit.  Visit Diagnosis:  Abnormality of gait  Muscle weakness (generalized)      Subjective Assessment - 04/25/15 1109    Subjective arrived today with cane; has walk aide on.  reports AFO will not fit in shoe anymore.   Patient Stated Goals improve balance; strength, gait.  "help me pick up my feet better."   Currently in Pain? Yes   Pain Score 5    Pain Location Coccyx   Pain Orientation Lower   Pain Descriptors / Indicators Constant;Aching   Pain Type Chronic pain   Pain Onset More than a month ago   Pain Frequency Constant   Aggravating Factors  unknown   Pain Relieving Factors injections (sch for end of april)                         [facility] Adult PT Treatment/Exercise - 04/25/15 1110    Ambulation/Gait   Ambulation/Gait Yes   Ambulation/Gait Assistance 4: Min assist   Ambulation Distance (Feet) 100 Feet   Assistive device Straight cane   Gait Pattern Poor foot clearance - right;Poor foot clearance - left;Scissoring;Lateral hip instability;Trendelenburg;Right genu  recurvatum;Decreased dorsiflexion - right;Decreased dorsiflexion - left;Step-to pattern;Decreased stance time - right;Decreased step length - left   Ambulation Surface Level;Indoor   Self-Care   Self-Care Other Self-Care Comments   Other Self-Care Comments  reinforced recommendation to use RW at this time as it is safest AD; pt adamant to use SPC only but more open to RW use with continued education about safety concerns   Exercises   Exercises Knee/Hip   Knee/Hip Exercises: Aerobic   Nustep L5 x 5 min lower extremities only   Knee/Hip Exercises: Seated   Long Arc Quad Both;10 reps   Marching Both;10 reps   Knee/Hip Exercises: Supine   Bridges Both;10 reps   Straight Leg Raises Both;10 reps   Straight Leg Raises Limitations 2#   Other Supine Knee/Hip Exercises hooklying marching x 10 bil   Other Supine Knee/Hip Exercises single limb abdct hooklying x 10 bil with yellow theraband   Knee/Hip Exercises: Sidelying   Hip ABduction Both;10 reps   Hip ABduction Limitations 2#                  PT Short Term Goals - 04/17/15 0919    PT SHORT TERM GOAL #1   Title independent with HEP (05/15/15)   Status New   PT SHORT TERM GOAL #2   Title perform BERG  balance test with LTG to be written (05/15/15)   Status New   PT SHORT TERM GOAL #3   Title improve gait velocity to > 1.0 ft/sec for improved mobility with LRAD (05/15/15)   PT SHORT TERM GOAL #4   Status New           PT Long Term Goals - 04/17/15 0920    PT LONG TERM GOAL #1   Title verbalize understanding of fall prevention strategies to decrease risk of reinjury (06/12/15)   Status New   PT LONG TERM GOAL #2   Title improve gait velocity to >/= 1.3 ft/sec for improved mobility (06/12/15)   Status New   PT LONG TERM GOAL #3   Title ambulate > 300' with LRAD modified independent for improved function and mobility on level/paved indoor/outdoor surfaces (06/12/15)   Status New   PT LONG TERM GOAL #4   Title BERG:    (06/12/15)   Status New               Plan - 04/25/15 1216    Clinical Impression Statement Initiated HEP for lower extremity strengthening and looked at possible AFOs. Smallest carbon fiber AFOs in clinic will not fit in pt's shoe (size 5.5), but appears most problems with foot clearance is due to hip weakness.  Pt arrived today with cane despite PT recommendation for RW and continued to be reluctant to use but more open to RW after discussion about safety with pt.   PT Next Visit Plan review HEP; core strengthening; balance; gait with RW (trial AFO if one will fit in shoe); BERG   Consulted and Agree with Plan of Care Patient        Problem List Patient Active Problem List   Diagnosis Date Noted  . Muscle spasticity 10/31/2013  . Nerve root pain 01/31/2013  . Cervical myelopathy ([place]) 05/05/2011  . BP (high blood pressure) 05/05/2011  . Restless leg 05/05/2011  . Arthralgia, sacroiliac 05/05/2011   Laureen Abrahams, PT, DPT 04/25/2015 12:19 PM  [facility] [address] [place], [place], [address] Phone: [phone]   Fax:  [phone]  Name: Amy Bray MRN: [id number] Date of Birth: [birth year]

## 2015-04-25 NOTE — Patient Instructions (Signed)
Bent Leg Lift (Hook-Lying)    Tighten stomach and slowly raise right leg __6-8__ inches from floor. Keep trunk rigid. Hold _1-2___ seconds.  Repeat with other leg. Repeat __10__ times per set. Do _1___ sets per session. Do __1-2__ sessions per day.  http://orth.exer.us/1091   Copyright  VHI. All rights reserved.   External Rotation: Hip - Knees Apart (Hook-Lying)    Lie with hips and knees bent, band tied just above knees. Push one knee out to side keeping other leg still. Repeat with other leg.  Repeat _10__ times. Do _1-2__ times a day.  Copyright  VHI. All rights reserved.   Bridging    Slowly raise buttocks from floor, keeping stomach tight. Repeat _10___ times per set. Do __1__ sets per session. Do __1-2__ sessions per day.  http://orth.exer.us/1097   Copyright  VHI. All rights reserved.    Straight Leg Raise    Tighten stomach and slowly raise locked right leg _6-8___ inches from floor.  Use 2 lb weights.  Repeat with other leg. Repeat __10__ times per set. Do __1__ sets per session. Do __1-2__ sessions per day.  http://orth.exer.us/1103   Copyright  VHI. All rights reserved.   Hip Abductors (Side Lying)    Lie on side with bottom leg bent at knee, _2___ pound weight on right leg. Lift up and back __6-8__ inches in air. Keep upper hip rolled forward and knee straight.  Repeat with other leg. Hold _1-2___ seconds. Repeat _10___ times. Do _1-2___ sessions per day. CAUTION: Move slowly.  Copyright  VHI. All rights reserved.   Hip (Front)    Begin sitting tall, both feet flat on floor. Inhale, then exhale while lifting knee as high as is comfortable, keeping upper body straight and still. Slowly return to starting position. Repeat __10__ times each leg. Do __1__ sets per session. Do __1-2__ sessions per day.  Copyright  VHI. All rights reserved.   Long [employer]    Straighten leg and try to hold it _1-2___ seconds.  Repeat with other  leg. Repeat _10___ times. Do _1-2___ sessions a day.  http://gt2.exer.us/311   Copyright  VHI. All rights reserved.

## 2015-04-26 ENCOUNTER — Ambulatory Visit: Payer: Medicare Other | Admitting: Physical Therapy

## 2015-04-27 DIAGNOSIS — N3941 Urge incontinence: Secondary | ICD-10-CM | POA: Diagnosis not present

## 2015-04-30 NOTE — Addendum Note (Signed)
Addended by: Faustino Congress F on: 04/30/2015 09:03 AM   Modules accepted: Orders

## 2015-05-01 ENCOUNTER — Ambulatory Visit: Payer: Medicare Other | Admitting: Physical Therapy

## 2015-05-01 DIAGNOSIS — R269 Unspecified abnormalities of gait and mobility: Secondary | ICD-10-CM | POA: Diagnosis not present

## 2015-05-01 DIAGNOSIS — M6281 Muscle weakness (generalized): Secondary | ICD-10-CM | POA: Diagnosis not present

## 2015-05-01 DIAGNOSIS — R2689 Other abnormalities of gait and mobility: Secondary | ICD-10-CM | POA: Diagnosis not present

## 2015-05-01 NOTE — Therapy (Signed)
[facility] [address] [place], [place], [address] Phone: [phone]   Fax:  [phone]  Physical Therapy Treatment  Patient Details  Name: Amy Bray MRN: [id number] Date of Birth: Aug 10, 1945 Referring Provider: Penni Bombard  Encounter Date: 05/01/2015      PT End of Session - 05/01/15 1314    Visit Number 3   Number of Visits 16   Date for PT Re-Evaluation 06/12/15   Authorization Type Medicare/AARP   PT Start Time 1240  pt arrived late   PT Stop Time 1314   PT Time Calculation (min) 34 min   Equipment Utilized During Treatment Gait belt   Activity Tolerance Patient tolerated treatment well   Behavior During Therapy [facility] for tasks assessed/performed      Past Medical History  Diagnosis Date  . Cervical myelopathy [facility])     Past Surgical History  Procedure Laterality Date  . Cervical fusion  01/2001, 01/2004    C3-C6    There were no vitals filed for this visit.      Subjective Assessment - 05/01/15 1243    Subjective feels like today is a "bad day." balance seems off and having trouble moving LLE.  yesterday was a "good day."   Pertinent History MS, ACDF x 2   Limitations Standing;Walking   Patient Stated Goals improve balance; strength, gait.  "help me pick up my feet better."   Currently in Pain? Yes   Pain Score 3    Pain Location Coccyx   Pain Orientation Lower   Pain Descriptors / Indicators Aching;Constant   Pain Type Chronic pain   Pain Onset More than a month ago   Pain Frequency Constant   Aggravating Factors  unknown   Pain Relieving Factors injections                         [facility] Adult PT Treatment/Exercise - 05/01/15 1244    Standardized Balance Assessment   Standardized Balance Assessment Berg Balance Test   Berg Balance Test   Sit to Stand Able to stand without using hands and stabilize independently   Standing Unsupported Able to stand  safely 2 minutes   Sitting with Back Unsupported but Feet Supported on Floor or Stool Able to sit safely and securely 2 minutes   Stand to Sit Controls descent by using hands   Transfers Able to transfer safely, definite need of hands   Standing Unsupported with Eyes Closed Able to stand 10 seconds with supervision   Standing Ubsupported with Feet Together Needs help to attain position but able to stand for 30 seconds with feet together   From Standing, Reach Forward with Outstretched Arm Can reach forward >5 cm safely (2")   From Standing Position, Pick up Object from Floor Able to pick up shoe, needs supervision   From Standing Position, Turn to Look Behind Over each Shoulder Needs supervision when turning   Turn 360 Degrees Needs close supervision or verbal cueing   Standing Unsupported, Alternately Place Feet on Step/Stool Needs assistance to keep from falling or unable to try   Standing Unsupported, One Foot in Front Needs help to step but can hold 15 seconds   Standing on One Leg Tries to lift leg/unable to hold 3 seconds but remains standing independently   Total Score 31   Knee/Hip Exercises: Seated   Long Arc Quad Both;10 reps   Marching Both;10 reps   Knee/Hip Exercises:  Supine   Bridges Both;10 reps   Straight Leg Raises Both;10 reps   Straight Leg Raises Limitations 2#   Other Supine Knee/Hip Exercises hooklying marching x 10 bil   Other Supine Knee/Hip Exercises single limb abdct hooklying x 10 bil with yellow theraband   Knee/Hip Exercises: Sidelying   Hip ABduction Both;10 reps   Hip ABduction Limitations 2#                  PT Short Term Goals - 05/01/15 1315    PT SHORT TERM GOAL #1   Title independent with HEP (05/15/15)   Status On-going   PT SHORT TERM GOAL #2   Title perform BERG balance test with LTG to be written (05/15/15)   Status Achieved   PT SHORT TERM GOAL #3   Title improve gait velocity to > 1.0 ft/sec for improved mobility with LRAD  (05/15/15)   Status On-going           PT Long Term Goals - 05/01/15 1315    PT LONG TERM GOAL #1   Title verbalize understanding of fall prevention strategies to decrease risk of reinjury (06/12/15)   Status On-going   PT LONG TERM GOAL #2   Title improve gait velocity to >/= 1.3 ft/sec for improved mobility (06/12/15)   Status On-going   PT LONG TERM GOAL #3   Title ambulate > 300' with LRAD modified independent for improved function and mobility on level/paved indoor/outdoor surfaces (06/12/15)   Status On-going   PT LONG TERM GOAL #4   Title BERG:  > 38/56 (06/12/15)   Status Revised               Plan - 05/01/15 1314    Clinical Impression Statement Pt scored 31/56 on BERG balance test indicating high fall risk.  Pt demonstrated independence with HEP.  Will continue to benefit from PT to maximize function.  Has ordered rollator to use instead of cane.   PT Next Visit Plan core strengthening; balance; gait with RW (trial AFO if one will fit in shoe)      Patient will benefit from skilled therapeutic intervention in order to improve the following deficits and impairments:     Visit Diagnosis: Other abnormalities of gait and mobility  Muscle weakness (generalized)     Problem List Patient Active Problem List   Diagnosis Date Noted  . Muscle spasticity 10/31/2013  . Nerve root pain 01/31/2013  . Cervical myelopathy ([place]) 05/05/2011  . BP (high blood pressure) 05/05/2011  . Restless leg 05/05/2011  . Arthralgia, sacroiliac 05/05/2011   Laureen Abrahams, PT, DPT 05/01/2015 1:17 PM  [facility] [address] [place], [place], [address] Phone: [phone]   Fax:  [phone]  Name: KIRSI KALATA MRN: [id number] Date of Birth: 1945-02-12

## 2015-05-03 ENCOUNTER — Ambulatory Visit: Payer: Medicare Other | Admitting: Physical Therapy

## 2015-05-07 DIAGNOSIS — G35 Multiple sclerosis: Secondary | ICD-10-CM | POA: Diagnosis not present

## 2015-05-09 ENCOUNTER — Ambulatory Visit: Payer: Medicare Other | Admitting: Physical Therapy

## 2015-05-09 ENCOUNTER — Encounter: Payer: Self-pay | Admitting: Physical Therapy

## 2015-05-09 DIAGNOSIS — R269 Unspecified abnormalities of gait and mobility: Secondary | ICD-10-CM | POA: Diagnosis not present

## 2015-05-09 DIAGNOSIS — M6281 Muscle weakness (generalized): Secondary | ICD-10-CM | POA: Diagnosis not present

## 2015-05-09 DIAGNOSIS — R2689 Other abnormalities of gait and mobility: Secondary | ICD-10-CM | POA: Diagnosis not present

## 2015-05-09 NOTE — Therapy (Signed)
[facility] [address] [place], [place], [address] Phone: [phone]   Fax:  [phone]  Physical Therapy Treatment  Patient Details  Name: Amy Bray MRN: [id number] Date of Birth: 1945-11-05 Referring Provider: Penni Bombard  Encounter Date: 05/09/2015      PT End of Session - 05/09/15 1336    Visit Number 4   Number of Visits 16   Date for PT Re-Evaluation 06/12/15   Authorization Type Medicare/AARP   PT Start Time 1235   PT Stop Time 1315   PT Time Calculation (min) 40 min   Equipment Utilized During Treatment Gait belt   Activity Tolerance Patient tolerated treatment well   Behavior During Therapy [facility] for tasks assessed/performed      Past Medical History  Diagnosis Date  . Cervical myelopathy [facility])     Past Surgical History  Procedure Laterality Date  . Cervical fusion  01/2001, 01/2004    C3-C6    There were no vitals filed for this visit.      Subjective Assessment - 05/09/15 1241    Subjective Doing HEP at home and found some balance exercises on the internet that she does holding on the counter or chair. Pt has Walking Aid on right and has AFO for right   Pertinent History MS, ACDF x 2   Limitations Standing;Walking   Patient Stated Goals improve balance; strength, gait.  "help me pick up my feet better."   Currently in Pain? Yes   Pain Score 3    Pain Location Coccyx   Pain Orientation Lower   Pain Descriptors / Indicators Aching;Throbbing   Pain Onset More than a month ago   Pain Frequency Constant                         [facility] Adult PT Treatment/Exercise - 05/09/15 0001    Ambulation/Gait   Ambulation/Gait Yes   Ambulation/Gait Assistance 5: Supervision   Ambulation/Gait Assistance Details trialling rollator  vs gait with SPC  Min A (HHA) with gait using SPC (100))   Ambulation Distance (Feet) 115 Feet 100'   Assistive device 4-wheeled walker   Gait Pattern Poor foot clearance - left;Poor foot clearance - right   Ambulation Surface Level             Balance Exercises - 05/09/15 1309    OTAGO PROGRAM   Knee Bends 10 reps, support           PT Education - 05/09/15 1325    Education provided Yes   Education Details Discussed possiblity of orthotic consult Bil foot drag; unable to trial  AFO due to not having one small enough to fit,  and Initiated [employer] program for HEP.   Person(s) Educated Patient   Methods Explanation   Comprehension Verbalized understanding;Need further instruction          PT Short Term Goals - 05/01/15 1315    PT SHORT TERM GOAL #1   Title independent with HEP (05/15/15)   Status On-going   PT SHORT TERM GOAL #2   Title perform BERG balance test with LTG to be written (05/15/15)   Status Achieved   PT SHORT TERM GOAL #3   Title improve gait velocity to > 1.0 ft/sec for improved mobility with LRAD (05/15/15)   Status On-going           PT Long Term Goals - 05/01/15 1315    PT  LONG TERM GOAL #1   Title verbalize understanding of fall prevention strategies to decrease risk of reinjury (06/12/15)   Status On-going   PT LONG TERM GOAL #2   Title improve gait velocity to >/= 1.3 ft/sec for improved mobility (06/12/15)   Status On-going   PT LONG TERM GOAL #3   Title ambulate > 300' with LRAD modified independent for improved function and mobility on level/paved indoor/outdoor surfaces (06/12/15)   Status On-going   PT LONG TERM GOAL #4   Title BERG:  > 38/56 (06/12/15)   Status Revised               Plan - 05/09/15 1303    Clinical Impression Statement Pt has a Walk Aid for Left foot but continues to have intermittant L Foot drag and has Right AFO from over 10 years ago that she doesn't use due it not fitting properly. Pt would like to have an orthotic consult.  Pt amb. more safely at a supervision level using rollator vs min A with SPC. Pt has ordered a rollator and plans to  start using it when it's ready.                             PT Next Visit Plan schedule orthotic consult if appropriate ( AFOs in gym do not fit), Add balance exercise to HEP.      Patient will benefit from skilled therapeutic intervention in order to improve the following deficits and impairments:     Visit Diagnosis: Other abnormalities of gait and mobility  Muscle weakness (generalized)  Abnormality of gait     Problem List Patient Active Problem List   Diagnosis Date Noted  . Muscle spasticity 10/31/2013  . Nerve root pain 01/31/2013  . Cervical myelopathy ([place]) 05/05/2011  . BP (high blood pressure) 05/05/2011  . Restless leg 05/05/2011  . Arthralgia, sacroiliac 05/05/2011    Bjorn Loser, PTA  05/09/2015, 1:57 PM [facility] [address] [place], [place], [address] Phone: [phone]   Fax:  [phone]  Name: Amy Bray MRN: [id number] Date of Birth: 11/19/1945

## 2015-05-10 ENCOUNTER — Ambulatory Visit: Payer: Medicare Other | Admitting: Physical Therapy

## 2015-05-10 DIAGNOSIS — R2689 Other abnormalities of gait and mobility: Secondary | ICD-10-CM

## 2015-05-10 DIAGNOSIS — M6281 Muscle weakness (generalized): Secondary | ICD-10-CM | POA: Diagnosis not present

## 2015-05-10 DIAGNOSIS — R269 Unspecified abnormalities of gait and mobility: Secondary | ICD-10-CM | POA: Diagnosis not present

## 2015-05-10 NOTE — Therapy (Signed)
[facility] [address] [place], [place], [address] Phone: [phone]   Fax:  [phone]  Physical Therapy Treatment  Patient Details  Name: Amy Bray MRN: [id number] Date of Birth: 1945-04-01 Referring Provider: Penni Bombard  Encounter Date: 05/10/2015      PT End of Session - 05/10/15 1236    Visit Number 5   Number of Visits 16   Date for PT Re-Evaluation 06/12/15   Authorization Type Medicare/AARP   PT Start Time 1107   PT Stop Time 1150   PT Time Calculation (min) 43 min   Equipment Utilized During Treatment Gait belt   Activity Tolerance Patient tolerated treatment well   Behavior During Therapy [facility] for tasks assessed/performed      Past Medical History  Diagnosis Date  . Cervical myelopathy [facility])     Past Surgical History  Procedure Laterality Date  . Cervical fusion  01/2001, 01/2004    C3-C6    There were no vitals filed for this visit.      Subjective Assessment - 05/10/15 1110    Subjective Pt arrived to session using new rollator. Notes that she has difficulty lifting rollar into/out of car.  Pt reports less difficulty walking backwards (backing up to chair) since starting home exercises.    Patient is accompained by: --  husband in waiting room   Pertinent History MS, ACDF x 2   Limitations Standing;Walking   Patient Stated Goals improve balance; strength, gait.  "help me pick up my feet better."   Currently in Pain? Yes   Pain Score 3    Pain Location Coccyx   Pain Orientation Lower   Pain Descriptors / Indicators Aching;Throbbing   Pain Type Chronic pain   Pain Onset More than a month ago   Pain Frequency Constant   Aggravating Factors  unknown   Pain Relieving Factors injections                         [facility] Adult PT Treatment/Exercise - 05/10/15 0001    Transfers   Transfers Sit to Stand;Stand to Sit   Sit to Stand 5: Supervision;6: Modified  independent (Device/Increase time)   Sit to Stand Details (indicate cue type and reason) Initially required cueing for safe use of roillator with transfers with effective within-session carryover from pt.   Stand to Sit 5: Supervision;6: Modified independent (Device/Increase time)   Stand to Sit Details See above.   Comments Practiced sitting on rollator seat x2 trials; initial trial with supervision, cueing for technique (locking brakes, pushing rollator against wall to prevent movement during transition). Pt gave effective return demo during subsequent trial.     Ambulation/Gait   Ambulation/Gait Yes   Ambulation/Gait Assistance 5: Supervision   Ambulation Distance (Feet) 475 Feet  total; seated rest breaks x2 using rollator   Assistive device 4-wheeled walker  L WalkAide   Gait Pattern Step-through pattern;Decreased hip/knee flexion - right;Decreased hip/knee flexion - left;Decreased dorsiflexion - right;Right circumduction;Right genu recurvatum;Narrow base of support;Poor foot clearance - left;Poor foot clearance - right   Ambulation Surface Level;Unlevel;Indoor;Outdoor;Paved   Door Management 4: Min assist   Door Managment Details (indicate cue type and reason) Verbal/demo cueing from PT for door management with rollator. Pt gave effective return demo, but did require min A with subsequent 2 trials    Ramp 5: Supervision   Ramp Details (indicate cue type and reason) using rollator; PT cueing to use  rollator brakes to slow descent.   Curb 4: Min assist;5: Supervision   Curb Details (indicate cue type and reason) x2 trials with rollator; initial trial with min guard, max cueing for technique. Subsequent trial with supervision, subtle to min cueing for technique.   Exercises   Exercises Other Exercises   Other Exercises  Blocked practice of sit <> stand from EOM without UE support, initially with wedge under hips x5 reps. Progressed to sit <> stnd without wedge with yellow Tband around B  knees in attempt to address excessive B hip adduction/IR (ineffective). Sit <> stand from EOM with ball betweeen knees to address hip ADD/IR (effective) x12 reps.             Balance Exercises - 05/09/15 1309    OTAGO PROGRAM   Knee Bends 10 reps, support           PT Education - 05/10/15 1207    Education provided Yes   Education Details Safe use of rollator with transfers, negotiation of standard ramp, curb step, and for sitting on rollator seat. PT explained and demonstrated how to open doors using rollator; however, recommended that pt have husband assist at this time.    Person(s) Educated Patient   Methods Explanation;Demonstration;Verbal cues   Comprehension Verbalized understanding;Returned demonstration          PT Short Term Goals - 05/01/15 1315    PT SHORT TERM GOAL #1   Title independent with HEP (05/15/15)   Status On-going   PT SHORT TERM GOAL #2   Title perform BERG balance test with LTG to be written (05/15/15)   Status Achieved   PT SHORT TERM GOAL #3   Title improve gait velocity to > 1.0 ft/sec for improved mobility with LRAD (05/15/15)   Status On-going           PT Long Term Goals - 05/01/15 1315    PT LONG TERM GOAL #1   Title verbalize understanding of fall prevention strategies to decrease risk of reinjury (06/12/15)   Status On-going   PT LONG TERM GOAL #2   Title improve gait velocity to >/= 1.3 ft/sec for improved mobility (06/12/15)   Status On-going   PT LONG TERM GOAL #3   Title ambulate > 300' with LRAD modified independent for improved function and mobility on level/paved indoor/outdoor surfaces (06/12/15)   Status On-going   PT LONG TERM GOAL #4   Title BERG:  > 38/56 (06/12/15)   Status Revised               Plan - 05/10/15 1237    Clinical Impression Statement Pt recently received personal rollator. Therefore, current session focused on gait training with rollator with emphasis on community obstacle negotiation. Pt  demonstrated effective within-session carryover of using rollator for transfers, gait, curb/ramp negotiation, safely sitting on rollator. Pt may need reinforcement in future sessions. Pt wishes to wait until final weeks of therapy to decide on orthotist consult for R foot drop.    Rehab Potential Fair   PT Frequency 2x / week   PT Duration 8 weeks   PT Treatment/Interventions ADLs/Self Care Home Management;Electrical Stimulation;Moist Heat;Patient/family education;Neuromuscular re-education;Balance training;Therapeutic exercise;Therapeutic activities;Stair training;Functional mobility training;Gait training;DME Instruction;Orthotic Fit/Training;Energy conservation   PT Next Visit Plan Continue standing balance, gait training. Expand on HEP.    Consulted and Agree with Plan of Care Patient      Patient will benefit from skilled therapeutic intervention in order to improve the following deficits  and impairments:  Abnormal gait, Decreased balance, Decreased safety awareness, Decreased mobility, Decreased strength, Pain, Decreased endurance, Decreased coordination  Visit Diagnosis: Other abnormalities of gait and mobility  Muscle weakness (generalized)     Problem List Patient Active Problem List   Diagnosis Date Noted  . Muscle spasticity 10/31/2013  . Nerve root pain 01/31/2013  . Cervical myelopathy ([place]) 05/05/2011  . BP (high blood pressure) 05/05/2011  . Restless leg 05/05/2011  . Arthralgia, sacroiliac 05/05/2011    Billie Ruddy, PT, DPT [facility] [address] [place], [place], [address] Phone: [phone]   Fax:  [phone] 05/10/2015, 12:43 PM  Name: Amy Bray MRN: [id number] Date of Birth: July 28, 1945

## 2015-05-14 ENCOUNTER — Ambulatory Visit: Payer: Medicare Other | Admitting: Physical Therapy

## 2015-05-14 DIAGNOSIS — R2689 Other abnormalities of gait and mobility: Secondary | ICD-10-CM | POA: Diagnosis not present

## 2015-05-14 DIAGNOSIS — M6281 Muscle weakness (generalized): Secondary | ICD-10-CM | POA: Diagnosis not present

## 2015-05-14 DIAGNOSIS — R269 Unspecified abnormalities of gait and mobility: Secondary | ICD-10-CM | POA: Diagnosis not present

## 2015-05-14 NOTE — Therapy (Signed)
[facility] [address] [place], [place], [address] Phone: [phone]   Fax:  [phone]  Physical Therapy Treatment  Patient Details  Name: Amy Bray MRN: [id number] Date of Birth: August 01, 1945 Referring Provider: Penni Bombard  Encounter Date: 05/14/2015      PT End of Session - 05/14/15 1620    Visit Number 6   Number of Visits 16   Date for PT Re-Evaluation 06/12/15   Authorization Type Medicare/AARP   PT Start Time 1338  Pt arrived late to session   PT Stop Time 1419   PT Time Calculation (min) 41 min   Activity Tolerance Patient tolerated treatment well   Behavior During Therapy [facility] for tasks assessed/performed      Past Medical History  Diagnosis Date  . Cervical myelopathy [facility])     Past Surgical History  Procedure Laterality Date  . Cervical fusion  01/2001, 01/2004    C3-C6    There were no vitals filed for this visit.      Subjective Assessment - 05/14/15 1539    Subjective "I've really been doing a whole lot of exercises. Instead of doing 10, I'm doing about 50 reps. I also do exercises I see on TV."   Pertinent History MS, ACDF x 2   Limitations Standing;Walking   Patient Stated Goals improve balance; strength, gait.  "help me pick up my feet better."   Currently in Pain? Yes   Pain Score 3    Pain Location Coccyx   Pain Orientation Lower   Pain Descriptors / Indicators Aching;Throbbing   Pain Type Chronic pain   Pain Onset More than a month ago   Pain Frequency Constant   Aggravating Factors  unknown   Pain Relieving Factors injections                              Balance Exercises - 05/14/15 1553    OTAGO PROGRAM   Head Movements Standing;5 reps  next to countertop   Neck Movements Standing;5 reps  next to countertop   Back Extension Standing;5 reps  standing next to countertop   Trunk Movements Standing;5 reps  single UE support   Ankle Movements Sitting;10 reps   Knee Extensor 20 reps;Weight (comment)  2 lbs   Knee Flexor 10 reps  no weight   Hip ABductor Other reps (comment)  no weight; x10 reps on R; x8 reps on L   Ankle Plantorflexors 20 reps, support  single UE support   Ankle Dorsiflexors --  seated x20 reps due to limited DF in standing   Knee Bends 10 reps, support  15 reps, single UE suport   Backwards Walking Support  10' x2   Sideways Walking Assistive device  10' x2 trials with single UE support at countertop   Tandem Stance 10 seconds, support  finger tip support   Tandem Walk Support           PT Education - 05/14/15 1620    Education provided Yes   Education Details [employer].   Person(s) Educated Patient   Methods Explanation;Demonstration;Handout;Verbal cues   Comprehension Verbalized understanding;Returned demonstration          PT Short Term Goals - 05/01/15 1315    PT SHORT TERM GOAL #1   Title independent with HEP (05/15/15)   Status On-going   PT SHORT TERM GOAL #2   Title perform BERG balance test  with LTG to be written (05/15/15)   Status Achieved   PT SHORT TERM GOAL #3   Title improve gait velocity to > 1.0 ft/sec for improved mobility with LRAD (05/15/15)   Status On-going           PT Long Term Goals - 05/01/15 1315    PT LONG TERM GOAL #1   Title verbalize understanding of fall prevention strategies to decrease risk of reinjury (06/12/15)   Status On-going   PT LONG TERM GOAL #2   Title improve gait velocity to >/= 1.3 ft/sec for improved mobility (06/12/15)   Status On-going   PT LONG TERM GOAL #3   Title ambulate > 300' with LRAD modified independent for improved function and mobility on level/paved indoor/outdoor surfaces (06/12/15)   Status On-going   PT LONG TERM GOAL #4   Title BERG:  > 38/56 (06/12/15)   Status Revised               Plan - 05/14/15 1621    Clinical Impression Statement Pt very motivated to perform many exercises at home;  however, unsure of alignment, technique, and quality of movement with said exercises. Therefore, current session focused on continued education on [place]. Pt tolerated well, but did require cueing for technique and safety. Continue per POC.    Rehab Potential Fair   PT Frequency 2x / week   PT Duration 8 weeks   PT Treatment/Interventions ADLs/Self Care Home Management;Electrical Stimulation;Moist Heat;Patient/family education;Neuromuscular re-education;Balance training;Therapeutic exercise;Therapeutic activities;Stair training;Functional mobility training;Gait training;DME Instruction;Orthotic Fit/Training;Energy conservation   PT Next Visit Plan [place] education on [place].    Consulted and Agree with Plan of Care Patient      Patient will benefit from skilled therapeutic intervention in order to improve the following deficits and impairments:  Abnormal gait, Decreased balance, Decreased safety awareness, Decreased mobility, Decreased strength, Pain, Decreased endurance, Decreased coordination  Visit Diagnosis: Other abnormalities of gait and mobility  Muscle weakness (generalized)     Problem List Patient Active Problem List   Diagnosis Date Noted  . Muscle spasticity 10/31/2013  . Nerve root pain 01/31/2013  . Cervical myelopathy ([place]) 05/05/2011  . BP (high blood pressure) 05/05/2011  . Restless leg 05/05/2011  . Arthralgia, sacroiliac 05/05/2011    Billie Ruddy, PT, DPT [facility] [address] [place], [place], [address] Phone: [phone]   Fax:  [phone] 05/14/2015, 4:28 PM  Name: CASSI BONEY MRN: [id number] Date of Birth: 1945/05/20

## 2015-05-15 ENCOUNTER — Ambulatory Visit: Payer: Medicare Other | Admitting: Physical Therapy

## 2015-05-17 ENCOUNTER — Ambulatory Visit: Payer: Medicare Other | Admitting: Rehabilitative and Restorative Service Providers"

## 2015-05-17 DIAGNOSIS — M6281 Muscle weakness (generalized): Secondary | ICD-10-CM | POA: Diagnosis not present

## 2015-05-17 DIAGNOSIS — R269 Unspecified abnormalities of gait and mobility: Secondary | ICD-10-CM | POA: Diagnosis not present

## 2015-05-17 DIAGNOSIS — R2689 Other abnormalities of gait and mobility: Secondary | ICD-10-CM | POA: Diagnosis not present

## 2015-05-17 NOTE — Therapy (Signed)
[facility] [address] [place], [place], [address] Phone: [phone]   Fax:  [phone]  Physical Therapy Treatment  Patient Details  Name: Amy Bray MRN: [id number] Date of Birth: 02/12/1945 Referring Provider: Penni Bombard  Encounter Date: 05/17/2015      PT End of Session - 05/17/15 1157    Visit Number 7   Number of Visits 16   Date for PT Re-Evaluation 06/12/15   Authorization Type Medicare/AARP   PT Start Time 1104   PT Stop Time 1147   PT Time Calculation (min) 43 min   Equipment Utilized During Treatment Gait belt   Activity Tolerance Patient tolerated treatment well   Behavior During Therapy [facility] for tasks assessed/performed      Past Medical History  Diagnosis Date  . Cervical myelopathy [facility])     Past Surgical History  Procedure Laterality Date  . Cervical fusion  01/2001, 01/2004    C3-C6    There were no vitals filed for this visit.      Subjective Assessment - 05/17/15 1107    Subjective The patient is doing HEP daily.  She is using SPC in the home and rollater RW intermittently.  She goes to pool 2 days week for 1.5 hours.    Pertinent History MS, ACDF x 2   Patient Stated Goals improve balance; strength, gait.  "help me pick up my feet better."   Currently in Pain? Yes   Pain Score 3    Pain Location Coccyx   Pain Orientation Lower   Pain Descriptors / Indicators Aching   Pain Type Chronic pain   Pain Onset More than a month ago   Pain Frequency Constant   Aggravating Factors  aches and shooting pain during walking   Pain Relieving Factors tramadol              [facility] Adult PT Treatment/Exercise - 05/17/15 1110    Ambulation/Gait   Ambulation/Gait Yes   Ambulation/Gait Assistance 5: Supervision   Ambulation Distance (Feet) 230 Feet   Assistive device 4-wheeled walker   Ambulation Surface Level   Gait Comments Gait with resistance at ASIS bilaterally to  encourage pelvic initiation during stance phase of gait, tactile cues to reduce trendelenburg gait (worse on R side).     Neuro Re-ed    Neuro Re-ed Details  Standing wall bumps x 10 reps with cues on hip extension and SBA for safety, standing marching with lateral weight shifting, lateral weight shifting lifting R and then L heel while maintaining upright posture.  Physioball sitting with core strengthening, physioball with marches and LE extension, pelvic mobility laterally and ant/posterior   Exercises   Exercises Other Exercises   Other Exercises  Sit<>stand x 5 reps with cues to abduct knees with UE support and Supervision, seated marching with cues on trunk upright x 10 reps R and L sides with lateral to midline march, supine lateral marching x 10 reps each side, clam shells hip abduction x 10 reps, bridges x 10 reps with cues to extend through hips.            Balance Exercises - 05/17/15 1156    OTAGO PROGRAM   One Leg Stand 10 seconds, support   Sit to Stand 5 reps, bilateral support   Overall OTAGO Comments PT copied these 2 activities to add to Ages program and patient reports she is already performing as part of her HEP.  PT recommended she bring  booklet with her to therapy in order to review all activities.             PT Short Term Goals - 05/17/15 1158    PT SHORT TERM GOAL #1   Title independent with HEP (05/15/15)   Baseline Patient needs cues on technique for HEP.   Status Partially Met   PT SHORT TERM GOAL #2   Title perform BERG balance test with LTG to be written (05/15/15)   Status Achieved   PT SHORT TERM GOAL #3   Title improve gait velocity to > 1.0 ft/sec for improved mobility with LRAD (05/15/15)   Baseline Improved from 0.63 ft/sec up to 0.91 ft/sec with rollater RW.   Status Partially Met           PT Long Term Goals - 05/01/15 1315    PT LONG TERM GOAL #1   Title verbalize understanding of fall prevention strategies to decrease risk of reinjury  (06/12/15)   Status On-going   PT LONG TERM GOAL #2   Title improve gait velocity to >/= 1.3 ft/sec for improved mobility (06/12/15)   Status On-going   PT LONG TERM GOAL #3   Title ambulate > 300' with LRAD modified independent for improved function and mobility on level/paved indoor/outdoor surfaces (06/12/15)   Status On-going   PT LONG TERM GOAL #4   Title BERG:  > 38/56 (06/12/15)   Status Revised               Plan - 05/17/15 1159    Clinical Impression Statement Patient has partially met STGs with increase in gait speed with 4 wheeled walker and ability to perform HEP.  PT to continue to work on hip and core stability for balance and improved ambulation.  Continue to long term goals.   PT Treatment/Interventions ADLs/Self Care Home Management;Electrical Stimulation;Moist Heat;Patient/family education;Neuromuscular re-education;Balance training;Therapeutic exercise;Therapeutic activities;Stair training;Functional mobility training;Gait training;DME Instruction;Orthotic Fit/Training;Energy conservation   PT Next Visit Plan Check all HEP ( if patient brings red folder ), continue to progress hip/core strengthening and functional mobility.   Consulted and Agree with Plan of Care Patient      Patient will benefit from skilled therapeutic intervention in order to improve the following deficits and impairments:  Abnormal gait, Decreased balance, Decreased safety awareness, Decreased mobility, Decreased strength, Pain, Decreased endurance, Decreased coordination  Visit Diagnosis: Other abnormalities of gait and mobility  Muscle weakness (generalized)     Problem List Patient Active Problem List   Diagnosis Date Noted  . Muscle spasticity 10/31/2013  . Nerve root pain 01/31/2013  . Cervical myelopathy ([place]) 05/05/2011  . BP (high blood pressure) 05/05/2011  . Restless leg 05/05/2011  . Arthralgia, sacroiliac 05/05/2011    Erma Raiche, PT 05/17/2015, 12:05 PM  [facility] [place] [address] [place], [place], [address] Phone: [phone]   Fax:  [phone]  Name: Amy Bray MRN: [id number] Date of Birth: 12-24-1945

## 2015-05-21 DIAGNOSIS — M4696 Unspecified inflammatory spondylopathy, lumbar region: Secondary | ICD-10-CM | POA: Diagnosis not present

## 2015-05-21 DIAGNOSIS — Z79899 Other long term (current) drug therapy: Secondary | ICD-10-CM | POA: Diagnosis not present

## 2015-05-21 DIAGNOSIS — G894 Chronic pain syndrome: Secondary | ICD-10-CM | POA: Diagnosis not present

## 2015-05-21 DIAGNOSIS — M5137 Other intervertebral disc degeneration, lumbosacral region: Secondary | ICD-10-CM | POA: Diagnosis not present

## 2015-05-21 DIAGNOSIS — Z79891 Long term (current) use of opiate analgesic: Secondary | ICD-10-CM | POA: Diagnosis not present

## 2015-05-23 ENCOUNTER — Ambulatory Visit: Payer: Medicare Other | Attending: Diagnostic Neuroimaging | Admitting: Physical Therapy

## 2015-05-23 DIAGNOSIS — R2689 Other abnormalities of gait and mobility: Secondary | ICD-10-CM | POA: Diagnosis not present

## 2015-05-23 DIAGNOSIS — M6281 Muscle weakness (generalized): Secondary | ICD-10-CM | POA: Diagnosis not present

## 2015-05-23 NOTE — Therapy (Signed)
[facility] [address] [place], [place], [address] Phone: [phone]   Fax:  [phone]  Physical Therapy Treatment  Patient Details  Name: Amy Bray MRN: [id number] Date of Birth: Feb 25, 1945 Referring Provider: Penni Bombard  Encounter Date: 05/23/2015      PT End of Session - 05/23/15 1927    Visit Number 8   Number of Visits 16   Date for PT Re-Evaluation 06/12/15   Authorization Type Medicare/AARP   PT Start Time 0931   PT Stop Time 1017   PT Time Calculation (min) 46 min   Activity Tolerance Patient tolerated treatment well   Behavior During Therapy [facility] for tasks assessed/performed      Past Medical History  Diagnosis Date  . Cervical myelopathy [facility])     Past Surgical History  Procedure Laterality Date  . Cervical fusion  01/2001, 01/2004    C3-C6    There were no vitals filed for this visit.      Subjective Assessment - 05/23/15 0933    Subjective "I didn't sleep well last night, so my walking is terrible today."   Pertinent History MS, ACDF x 2   Limitations Standing;Walking   Patient Stated Goals improve balance; strength, gait.  "help me pick up my feet better."   Currently in Pain? Yes   Pain Score 2    Pain Location Coccyx   Pain Orientation Lower   Pain Descriptors / Indicators Aching   Pain Type Chronic pain   Pain Onset More than a month ago   Pain Frequency Constant   Aggravating Factors  everything (walking and sitting"   Pain Relieving Factors tramadol   Multiple Pain Sites No            [facility] PT Assessment - 05/23/15 0001    Strength   Overall Strength Comments Further assessment of B hip strength reveals hip ABD 3+/5 on R, 3-/5 on L; hip extension 3-/5 on R, 4/5 on L                     [facility] Adult PT Treatment/Exercise - 05/23/15 0001    Ambulation/Gait   Ambulation/Gait Yes   Ambulation/Gait Assistance 5: Supervision   Ambulation  Distance (Feet) 200 Feet   Assistive device 4-wheeled walker   Gait Pattern Step-through pattern;Decreased hip/knee flexion - right;Decreased hip/knee flexion - left;Decreased dorsiflexion - right;Right circumduction;Right genu recurvatum;Narrow base of support;Poor foot clearance - left;Poor foot clearance - right   Ambulation Surface Level;Indoor   Exercises   Exercises Other Exercises   Other Exercises  Supine with RLE extended off EOM with foot on 6" step, pt performed R hip extension x10 reps (to pt fatigue) and same exercise x20 reps on L side. With verbal/tactile cueing from Pt, pt performed clamshells 2 x10 reps on L, 2 x15 reps on R; bridging with abduction resisted by red Tband 2 x10 reps; and supine abdominal bracing with concurrent LE marching 4 consecutive reps x6 sets with rests between.              Balance Exercises - 05/23/15 1920    OTAGO PROGRAM   Overall OTAGO Comments PT recommended to defer remainder of [place] (those exercises not covered during PT sessions) due to poor quality of movement and safety concerns with exercises.           PT Education - 05/23/15 1917    Education provided Yes   Education Details Logroll  transfer to increased efficiency of, independence with supine <> sit. Provided hip/core strengthening HEP and instructed pt to focus on these exercises rather than on [place].    Person(s) Educated Patient   Methods Explanation;Demonstration;Verbal cues;Tactile cues;Handout   Comprehension Returned demonstration;Verbalized understanding          PT Short Term Goals - 05/17/15 1158    PT SHORT TERM GOAL #1   Title independent with HEP (05/15/15)   Baseline Patient needs cues on technique for HEP.   Status Partially Met   PT SHORT TERM GOAL #2   Title perform BERG balance test with LTG to be written (05/15/15)   Status Achieved   PT SHORT TERM GOAL #3   Title improve gait velocity to > 1.0 ft/sec for improved mobility with LRAD (05/15/15)    Baseline Improved from 0.63 ft/sec up to 0.91 ft/sec with rollater RW.   Status Partially Met           PT Long Term Goals - 05/01/15 1315    PT LONG TERM GOAL #1   Title verbalize understanding of fall prevention strategies to decrease risk of reinjury (06/12/15)   Status On-going   PT LONG TERM GOAL #2   Title improve gait velocity to >/= 1.3 ft/sec for improved mobility (06/12/15)   Status On-going   PT LONG TERM GOAL #3   Title ambulate > 300' with LRAD modified independent for improved function and mobility on level/paved indoor/outdoor surfaces (06/12/15)   Status On-going   PT LONG TERM GOAL #4   Title BERG:  > 38/56 (06/12/15)   Status Revised               Plan - 05/23/15 1928    Clinical Impression Statement Session focused on modifying HEP to focus on hip strengthening with emphasis on hip ABD and extension, bilaterally. Recommending to defer remainder of [place] (those exercises not addressed during past PT sessions) due to safety concerns and poor quality of movement with said exercises. Pt in agreement.   Rehab Potential Fair   PT Frequency 2x / week   PT Duration 8 weeks   PT Treatment/Interventions ADLs/Self Care Home Management;Electrical Stimulation;Moist Heat;Patient/family education;Neuromuscular re-education;Balance training;Therapeutic exercise;Therapeutic activities;Stair training;Functional mobility training;Gait training;DME Instruction;Orthotic Fit/Training;Energy conservation   PT Next Visit Plan Continue to address core and hip strengthening.   Consulted and Agree with Plan of Care Patient      Patient will benefit from skilled therapeutic intervention in order to improve the following deficits and impairments:  Abnormal gait, Decreased balance, Decreased safety awareness, Decreased mobility, Decreased strength, Pain, Decreased endurance, Decreased coordination  Visit Diagnosis: Other abnormalities of gait and mobility  Muscle weakness  (generalized)     Problem List Patient Active Problem List   Diagnosis Date Noted  . Muscle spasticity 10/31/2013  . Nerve root pain 01/31/2013  . Cervical myelopathy ([employer]) 05/05/2011  . BP (high blood pressure) 05/05/2011  . Restless leg 05/05/2011  . Arthralgia, sacroiliac 05/05/2011    Billie Ruddy, PT, DPT [facility] [address] [place], [place], [address] Phone: [phone]   Fax:  [phone] 05/23/2015, 7:30 PM  Name: Amy Bray MRN: [id number] Date of Birth: October 04, 1945

## 2015-05-23 NOTE — Patient Instructions (Addendum)
Abduction: Clam (Eccentric) - Side-Lying    Lie on side with knees bent. Lift top knee, keeping feet together. Keep trunk steady - don't let hips roll backward as you're raising your top knee. Slowly lower for 3-5 seconds. Perform 15 reps on right and 10 reps on left. Perform __2-3__ times per day.   Abductor Strength: Bridge Pose (Strap)    Loop RED resistance band around knees. Press knees outward into strap as you pick your hips up form the mat. Hold for 1-2 seconds, then slowly lower. Perform 10 reps, __2-3__ times per day.  Bracing With Leg March (Hook-Lying)    Lie on back and brace abdominal muscle we talked about. To find to this muscle, place hands on hip pointer bones and walk fingers 3 finger-widths inward (toward belly button) and 2-3 finger-widths downward. Think about pulling your belly button toward your backbone; you should be able to feel the muscle under your finger tips. Alternating legs, lift foot slightly from floor. Repeat __4-6_ times. Relax. Repeat 6-8 times, twice per day.  Log Roll    Lying on back, bend both knees and place arm furthest from edge of bed across chest. Roll onto your side as one unit. Bring both legs off edge of bed and push up from side lying to seated. Likewise, get into bed by lowering onto your shoulder with both legs bent/stacked. Then roll from side lying onto back.

## 2015-05-25 ENCOUNTER — Ambulatory Visit: Payer: Medicare Other | Admitting: Physical Therapy

## 2015-05-25 DIAGNOSIS — M6281 Muscle weakness (generalized): Secondary | ICD-10-CM | POA: Diagnosis not present

## 2015-05-25 DIAGNOSIS — R2689 Other abnormalities of gait and mobility: Secondary | ICD-10-CM | POA: Diagnosis not present

## 2015-05-25 NOTE — Therapy (Signed)
[facility] [address] [place], [place], [address] Phone: [phone]   Fax:  [phone]  Physical Therapy Treatment  Patient Details  Name: Amy Bray MRN: [id number] Date of Birth: [birth year] Referring Provider: Penni Bombard  Encounter Date: 05/25/2015      PT End of Session - 05/25/15 1435    Visit Number 9   Number of Visits 16   Date for PT Re-Evaluation 06/12/15   Authorization Type Medicare/AARP   PT Start Time 1316   PT Stop Time 1400   PT Time Calculation (min) 44 min   Activity Tolerance Patient tolerated treatment well   Behavior During Therapy [facility] for tasks assessed/performed      Past Medical History  Diagnosis Date  . Cervical myelopathy [facility])     Past Surgical History  Procedure Laterality Date  . Cervical fusion  01/2001, 01/2004    C3-C6    There were no vitals filed for this visit.      Subjective Assessment - 05/25/15 1319    Subjective Pt reports no falls, no significant changes. Coccyx pain improving since recent injection. Pt has not had the opporunity to do new home exercise since last session.   Pertinent History MS, ACDF x 2   Limitations Standing;Walking   Patient Stated Goals improve balance; strength, gait.  "help me pick up my feet better."   Currently in Pain? Yes   Pain Score 3    Pain Location Coccyx   Pain Orientation Lower   Pain Descriptors / Indicators Aching   Pain Type Chronic pain   Pain Onset More than a month ago   Pain Frequency Constant   Aggravating Factors  walking and sitting   Pain Relieving Factors tramadol   Multiple Pain Sites No                         [facility] Adult PT Treatment/Exercise - 05/25/15 0001    Transfers   Floor to Transfer 6: Modified independent (Device/Increase time);4: Min assist   Floor to Transfer Details (indicate cue type and reason) Initial transfer with mod I; second triansfer with min A due  to pt fatigue.   Exercises   Exercises Other Exercises   Other Exercises  With verbal cueing from PT pt performed clamshells x15 reps per side; bridging with abduction resisted by red Tband x15 reps; and supine abdominal bracing with concurrent LE marching x8 consecutive reps x3 trials. Quadruped alternating hip extension x10 reps then x7 reps.  Static tall kneeling without UE support x15 sec, x30 sec for proximal stability/control. With intermittent UE support, performed tall kneeling forward/retro stepping x5 reps per LE. Supine bent knee fallouts 6 x15-sec holds for B hip adductor extensibility; seated figure-4 stretch 2 x60-sec holds per side to increase B hip external rotation ROM. Seated on physioball (min guard and support on R side of ball required for stability/balance), pt performed anterior/posterior pelvic tilts x10 reps to inc awareness of seated posture; marching on physioball 2 x5 reps then x10 reps with cueing to maintain neutral pelvic tilt.  increased time required for transitional movements                PT Education - 05/25/15 1428    Education provided Yes   Education Details Reviewed current HEP. Added figure 4 stretch; see pt instructions.   Person(s) Educated Patient   Methods Explanation;Demonstration;Verbal cues;Handout   Comprehension Verbalized understanding;Returned demonstration  PT Short Term Goals - 05/17/15 1158    PT SHORT TERM GOAL #1   Title independent with HEP (05/15/15)   Baseline Patient needs cues on technique for HEP.   Status Partially Met   PT SHORT TERM GOAL #2   Title perform BERG balance test with LTG to be written (05/15/15)   Status Achieved   PT SHORT TERM GOAL #3   Title improve gait velocity to > 1.0 ft/sec for improved mobility with LRAD (05/15/15)   Baseline Improved from 0.63 ft/sec up to 0.91 ft/sec with rollater RW.   Status Partially Met           PT Long Term Goals - 05/01/15 1315    PT LONG TERM GOAL #1    Title verbalize understanding of fall prevention strategies to decrease risk of reinjury (06/12/15)   Status On-going   PT LONG TERM GOAL #2   Title improve gait velocity to >/= 1.3 ft/sec for improved mobility (06/12/15)   Status On-going   PT LONG TERM GOAL #3   Title ambulate > 300' with LRAD modified independent for improved function and mobility on level/paved indoor/outdoor surfaces (06/12/15)   Status On-going   PT LONG TERM GOAL #4   Title BERG:  > 38/56 (06/12/15)   Status Revised               Plan - 05/25/15 1435    Clinical Impression Statement Continued to focus on increasing proximal stability/control during this session. Pt tolerated therex well but requires cueing for improved quality of movement rather than increased reps.    Rehab Potential Fair   PT Frequency 2x / week   PT Duration 8 weeks   PT Treatment/Interventions ADLs/Self Care Home Management;Electrical Stimulation;Moist Heat;Patient/family education;Neuromuscular re-education;Balance training;Therapeutic exercise;Therapeutic activities;Stair training;Functional mobility training;Gait training;DME Instruction;Orthotic Fit/Training;Energy conservation   PT Next Visit Plan GCODES (gait velocity) and PN. Continue to address core and hip strengthening.   Consulted and Agree with Plan of Care Patient      Patient will benefit from skilled therapeutic intervention in order to improve the following deficits and impairments:  Abnormal gait, Decreased balance, Decreased safety awareness, Decreased mobility, Decreased strength, Pain, Decreased endurance, Decreased coordination  Visit Diagnosis: Muscle weakness (generalized)  Other abnormalities of gait and mobility     Problem List Patient Active Problem List   Diagnosis Date Noted  . Muscle spasticity 10/31/2013  . Nerve root pain 01/31/2013  . Cervical myelopathy ([place]) 05/05/2011  . BP (high blood pressure) 05/05/2011  . Restless leg 05/05/2011  .  Arthralgia, sacroiliac 05/05/2011   Billie Ruddy, PT, DPT [facility] [address] [place], [place], [address] Phone: [phone]   Fax:  [phone] 05/25/2015, 2:38 PM  Name: Amy Bray MRN: [id number] Date of Birth: [birth year]

## 2015-05-25 NOTE — Patient Instructions (Signed)
HIP: External Rotation    Sit at edge of surface. Cross one leg over other knee. Press down gently on knee. Hold _45-60_ seconds. __3-4__ reps per day on each LE.

## 2015-05-30 ENCOUNTER — Ambulatory Visit: Payer: Medicare Other | Admitting: Physical Therapy

## 2015-05-30 ENCOUNTER — Encounter: Payer: Self-pay | Admitting: Physical Therapy

## 2015-05-30 DIAGNOSIS — R2689 Other abnormalities of gait and mobility: Secondary | ICD-10-CM | POA: Diagnosis not present

## 2015-05-30 DIAGNOSIS — M6281 Muscle weakness (generalized): Secondary | ICD-10-CM | POA: Diagnosis not present

## 2015-05-30 NOTE — Therapy (Signed)
[facility] [address] [place], [place], [address] Phone: [phone]   Fax:  [phone]  Physical Therapy Treatment  Patient Details  Name: Amy Bray MRN: [id number] Date of Birth: 23-Jun-1945 Referring Provider: Penni Bombard  Encounter Date: 05/30/2015      PT End of Session - 05/30/15 1323    Visit Number 10   Number of Visits 16   Date for PT Re-Evaluation 06/12/15   Authorization Type Medicare/AARP   PT Start Time 1316   PT Stop Time 1400   PT Time Calculation (min) 44 min   Activity Tolerance Patient tolerated treatment well   Behavior During Therapy [facility] for tasks assessed/performed      Past Medical History  Diagnosis Date  . Cervical myelopathy [facility])     Past Surgical History  Procedure Laterality Date  . Cervical fusion  01/2001, 01/2004    C3-C6    There were no vitals filed for this visit.      Subjective Assessment - 05/30/15 1322    Subjective No new complaints. No falls to report. Coccyx pain continues to improve with time and exercises. To clinic today with rollator, no walk aide/braces on.   Patient is accompained by: Family member   Pertinent History MS, ACDF x 2   Limitations Standing;Walking   Patient Stated Goals improve balance; strength, gait.  "help me pick up my feet better."   Currently in Pain? Yes   Pain Score 2    Pain Location Coccyx   Pain Orientation Lower   Pain Descriptors / Indicators Aching   Pain Type Chronic pain   Pain Onset More than a month ago   Pain Frequency Constant   Aggravating Factors  walking and prolonged sitting.   Pain Relieving Factors aleve or tramadol            [facility] Adult PT Treatment/Exercise - 05/30/15 1340    Transfers   Transfers Sit to Stand;Stand to Sit   Ambulation/Gait   Ambulation/Gait Yes   Ambulation/Gait Assistance 5: Supervision   Ambulation/Gait Assistance Details multiple episodes of left foot catching  with 1st gait trail. added foot up for second gait trial with some improvement. added simulated toe cap along with foot up brace to 3rd trial with no episodes of left foot catching noted. right foot catching a few times total with all 3 gait trials. cues needed with all gait for increased step length and increased hip/knee fleixon for increased foot clearance.                         Ambulation Distance (Feet) 115 Feet  x 3   Assistive device Rollator   Gait Pattern Step-through pattern;Decreased hip/knee flexion - right;Decreased hip/knee flexion - left;Decreased dorsiflexion - right;Right circumduction;Right genu recurvatum;Narrow base of support;Poor foot clearance - left;Poor foot clearance - right   Ambulation Surface Level;Indoor   Gait velocity 42.13 sec's= 0.78 ft/sec  with foot up brace/simultated toe cap   Knee/Hip Exercises: Supine   Bridges Strengthening;Both;10 reps;Limitations;2 sets   Bridges Limitations red band around legs above knees, pulling knees apart/back together while holding bridge position   Straight Leg Raises AROM;Strengthening;Both;2 sets;10 reps;AAROM;Limitations   Straight Leg Raises Limitations no weight. 5 sec holds, assist to control the descent back to mat   Other Supine Knee/Hip Exercises hooklying with abdominal bracing: alternating marching with 5 sec holds, 2 sets of 10 reps each leg  PT Short Term Goals - 05/17/15 1158    PT SHORT TERM GOAL #1   Title independent with HEP (05/15/15)   Baseline Patient needs cues on technique for HEP.   Status Partially Met   PT SHORT TERM GOAL #2   Title perform BERG balance test with LTG to be written (05/15/15)   Status Achieved   PT SHORT TERM GOAL #3   Title improve gait velocity to > 1.0 ft/sec for improved mobility with LRAD (05/15/15)   Baseline Improved from 0.63 ft/sec up to 0.91 ft/sec with rollater RW.   Status Partially Met           PT Long Term Goals - 05/01/15 1315    PT LONG TERM  GOAL #1   Title verbalize understanding of fall prevention strategies to decrease risk of reinjury (06/12/15)   Status On-going   PT LONG TERM GOAL #2   Title improve gait velocity to >/= 1.3 ft/sec for improved mobility (06/12/15)   Status On-going   PT LONG TERM GOAL #3   Title ambulate > 300' with LRAD modified independent for improved function and mobility on level/paved indoor/outdoor surfaces (06/12/15)   Status On-going   PT LONG TERM GOAL #4   Title BERG:  > 38/56 (06/12/15)   Status Revised            Plan - 06/05/2015 1323    Clinical Impression Statement Pt with improved gait velocity (10 meter test) today. Continues to need cues on ex form/technique and for increased hip/knee flexion for foot clearance with gait. Trialed foot up brace/simulated toe cap on left today with notable improvement with foot clearance and gait stability.                                     Rehab Potential Fair   PT Frequency 2x / week   PT Duration 8 weeks   PT Treatment/Interventions ADLs/Self Care Home Management;Electrical Stimulation;Moist Heat;Patient/family education;Neuromuscular re-education;Balance training;Therapeutic exercise;Therapeutic activities;Stair training;Functional mobility training;Gait training;DME Instruction;Orthotic Fit/Training;Energy conservation   PT Next Visit Plan Continue to address core and hip strengthening. Pt expressed interest in working with foot up brace/toe cap with gait.    Consulted and Agree with Plan of Care Patient      Patient will benefit from skilled therapeutic intervention in order to improve the following deficits and impairments:  Abnormal gait, Decreased balance, Decreased safety awareness, Decreased mobility, Decreased strength, Pain, Decreased endurance, Decreased coordination  Visit Diagnosis: Muscle weakness (generalized)  Other abnormalities of gait and mobility       G-Codes - 06-05-15 1403    Functional Assessment Tool Used 0.78 ft/sec  with rollator   Functional Limitation Mobility: Walking and moving around   Mobility: Walking and Moving Around Current Status [phone]) At least 40 percent but less than 60 percent impaired, limited or restricted   Mobility: Walking and Moving Around Goal Status [phone]) At least 20 percent but less than 40 percent impaired, limited or restricted     G-code completed by Faustino Congress, PT, DPT  Problem List Patient Active Problem List   Diagnosis Date Noted  . Muscle spasticity 10/31/2013  . Nerve root pain 01/31/2013  . Cervical myelopathy ([place]) 05/05/2011  . BP (high blood pressure) 05/05/2011  . Restless leg 05/05/2011  . Arthralgia, sacroiliac 05/05/2011    Willow Ora, PTA, CLT [facility] [address] [place],  [place] [address] [phone] 05/30/2015, 2:13 PM   Name: Amy Bray MRN: [id number] Date of Birth: 1945-11-29      Physical Therapy Progress Note  Dates of Reporting Period: 04/17/15 to 05/30/15  Objective Reports of Subjective Statement: Gait velocity and functional mobility improving with use of rollator instead of SPC.  Pt much safer with amb with rollator.    Objective Measurements: Gait velocity improved from 0.63 ft/sec to 0.78 ft/sec; pt performing HEP with min cues  Goal Update: see above; STGs partially met; pt making progress from initial evaluation  Plan: continue PT 2x/wk x 2 weeks with plan to reassess and renew as needed.  Reason Skilled Services are Required: pt is high fall risk with decreased safe functional mobility   Laureen Abrahams, PT, DPT 05/30/2015 2:54 PM  [facility] [address] [place], [place] [address]  [phone] (office) [phone] (fax)

## 2015-05-31 DIAGNOSIS — R3915 Urgency of urination: Secondary | ICD-10-CM | POA: Diagnosis not present

## 2015-05-31 DIAGNOSIS — R350 Frequency of micturition: Secondary | ICD-10-CM | POA: Diagnosis not present

## 2015-05-31 DIAGNOSIS — N3941 Urge incontinence: Secondary | ICD-10-CM | POA: Diagnosis not present

## 2015-06-01 ENCOUNTER — Ambulatory Visit: Payer: Medicare Other | Admitting: Physical Therapy

## 2015-06-01 DIAGNOSIS — R2689 Other abnormalities of gait and mobility: Secondary | ICD-10-CM

## 2015-06-01 DIAGNOSIS — M6281 Muscle weakness (generalized): Secondary | ICD-10-CM

## 2015-06-01 NOTE — Therapy (Signed)
[facility] [address] [place], [place], [address] Phone: [phone]   Fax:  [phone]  Physical Therapy Treatment  Patient Details  Name: Amy Bray MRN: [id number] Date of Birth: 06-Jul-1945 Referring Provider: Penni Bombard  Encounter Date: 06/01/2015      PT End of Session - 06/01/15 1934    Visit Number 11   Number of Visits 16   Date for PT Re-Evaluation 06/12/15   Authorization Type Medicare/AARP   PT Start Time 1317   PT Stop Time 1402   PT Time Calculation (min) 45 min   Activity Tolerance Patient tolerated treatment well   Behavior During Therapy [facility] for tasks assessed/performed      Past Medical History  Diagnosis Date  . Cervical myelopathy [facility])     Past Surgical History  Procedure Laterality Date  . Cervical fusion  01/2001, 01/2004    C3-C6    There were no vitals filed for this visit.      Subjective Assessment - 06/01/15 1320    Subjective Pt reports no falls and reports no significant changes. No WalkAid today.   Patient is accompained by: Family member   Pertinent History MS, ACDF x 2   Limitations Standing;Walking   Patient Stated Goals improve balance; strength, gait.  "help me pick up my feet better."   Currently in Pain? Yes   Pain Score 3    Pain Location Coccyx   Pain Orientation Lower   Pain Descriptors / Indicators Aching   Pain Type Chronic pain   Pain Onset More than a month ago   Pain Frequency Constant   Aggravating Factors  walking and prolonged sitting   Pain Relieving Factors aleve or tramadol   Multiple Pain Sites No                         [facility] Adult PT Treatment/Exercise - 06/01/15 0001    Ambulation/Gait   Ambulation/Gait Yes   Ambulation/Gait Assistance 4: Min guard;4: Min assist   Ambulation/Gait Assistance Details Min guard to min A due to L toe catch x2 and R toe catch x1 during 115' trial. Performed subsequent 115'  gait trial with L Foot Up brace for DF assist, consistent RLE clearance, which was grossly effective (L foot catch x1 due to limited L hip/knee flexion during LLE advancemnet). Final 2 gait trials 2 x115' each focused on controlling R genu recurvatum, initially with heel wedge in R shoe, then with R PLS AFO. Even with mod-max verbal/tactile cueing, neither R heel wedge nor R PLS AFO effective in controlling R GR.   Ambulation Distance (Feet) 460 Feet  115' x4 trials   Assistive device Rollator;Other (Comment)  orthotics as described above   Gait Pattern Step-through pattern;Decreased hip/knee flexion - right;Decreased hip/knee flexion - left;Decreased dorsiflexion - right;Right circumduction;Right genu recurvatum;Narrow base of support;Poor foot clearance - left;Poor foot clearance - right   Ambulation Surface Level;Indoor   Gait Comments Increased time required between trials ot don/doff L Foot Up brace, R PLS AFO, and R heel wedge.   Exercises   Exercises Other Exercises   Other Exercises  Clamshells x20 reps resisted by yellow Tband; min cueing for alignment with effective return demo.                PT Education - 06/01/15 1917    Education provided Yes   Education Details HEP: progressed clamshells by adding yellow Tband for  resistance, increasing reps to 20. Provided info on ordering personal Foot Up brace. Recommended to wear Walk Aid on RLE at next session to determine if genu recurvatum better controlled.    Person(s) Educated Patient   Methods Explanation   Comprehension Verbalized understanding;Returned demonstration          PT Short Term Goals - 05/17/15 1158    PT SHORT TERM GOAL #1   Title independent with HEP (05/15/15)   Baseline Patient needs cues on technique for HEP.   Status Partially Met   PT SHORT TERM GOAL #2   Title perform BERG balance test with LTG to be written (05/15/15)   Status Achieved   PT SHORT TERM GOAL #3   Title improve gait velocity to >  1.0 ft/sec for improved mobility with LRAD (05/15/15)   Baseline Improved from 0.63 ft/sec up to 0.91 ft/sec with rollater RW.   Status Partially Met           PT Long Term Goals - 05/01/15 1315    PT LONG TERM GOAL #1   Title verbalize understanding of fall prevention strategies to decrease risk of reinjury (06/12/15)   Status On-going   PT LONG TERM GOAL #2   Title improve gait velocity to >/= 1.3 ft/sec for improved mobility (06/12/15)   Status On-going   PT LONG TERM GOAL #3   Title ambulate > 300' with LRAD modified independent for improved function and mobility on level/paved indoor/outdoor surfaces (06/12/15)   Status On-going   PT LONG TERM GOAL #4   Title BERG:  > 38/56 (06/12/15)   Status Revised               Plan - 06/01/15 1935    Clinical Impression Statement Performed additional trial of L Foot Up brace, which did improve pt's L foot clearance during gait; however, pt does continue to require cueing for increased hip/knee flexion during LLE advancement. Unable to control R genu recurvatum using R PLS AFO or R heel wedge. Recommending pt wear Walk Aid on RLE at next session to assess impact on R GR. Provided pt with information on ordering personal L Foot Up brace.    Rehab Potential Fair   PT Frequency 2x / week   PT Duration 8 weeks   PT Treatment/Interventions ADLs/Self Care Home Management;Electrical Stimulation;Moist Heat;Patient/family education;Neuromuscular re-education;Balance training;Therapeutic exercise;Therapeutic activities;Stair training;Functional mobility training;Gait training;DME Instruction;Orthotic Fit/Training;Energy conservation   PT Next Visit Plan Continue to address core and hip strengthening. If pt wearing Walk Aid on RLE, assess R GR.    Consulted and Agree with Plan of Care Patient      Patient will benefit from skilled therapeutic intervention in order to improve the following deficits and impairments:  Abnormal gait, Decreased  balance, Decreased safety awareness, Decreased mobility, Decreased strength, Pain, Decreased endurance, Decreased coordination  Visit Diagnosis: Other abnormalities of gait and mobility  Muscle weakness (generalized)     Problem List Patient Active Problem List   Diagnosis Date Noted  . Muscle spasticity 10/31/2013  . Nerve root pain 01/31/2013  . Cervical myelopathy ([place]) 05/05/2011  . BP (high blood pressure) 05/05/2011  . Restless leg 05/05/2011  . Arthralgia, sacroiliac 05/05/2011    Billie Ruddy, PT, DPT [facility] [address] [place], [place], [address] Phone: [phone]   Fax:  [phone] 06/01/2015, 7:42 PM  Name: Amy Bray MRN: [id number] Date of Birth: [birth year]

## 2015-06-04 DIAGNOSIS — G35 Multiple sclerosis: Secondary | ICD-10-CM | POA: Diagnosis not present

## 2015-06-06 ENCOUNTER — Ambulatory Visit: Payer: Medicare Other | Admitting: Physical Therapy

## 2015-06-06 DIAGNOSIS — R2689 Other abnormalities of gait and mobility: Secondary | ICD-10-CM | POA: Diagnosis not present

## 2015-06-06 DIAGNOSIS — M6281 Muscle weakness (generalized): Secondary | ICD-10-CM

## 2015-06-06 NOTE — Therapy (Signed)
[facility] [address] [place], [place], [address] Phone: [phone]   Fax:  [phone]  Physical Therapy Treatment  Patient Details  Name: Amy Bray MRN: [id number] Date of Birth: 19-Aug-1945 Referring Provider: Penni Bombard  Encounter Date: 06/06/2015      PT End of Session - 06/06/15 2113    Visit Number 12   Number of Visits 16   Date for PT Re-Evaluation 06/12/15   Authorization Type Medicare/AARP   PT Start Time 1405   PT Stop Time 1448   PT Time Calculation (min) 43 min   Activity Tolerance Patient tolerated treatment well   Behavior During Therapy [facility] for tasks assessed/performed      Past Medical History  Diagnosis Date  . Cervical myelopathy [facility])     Past Surgical History  Procedure Laterality Date  . Cervical fusion  01/2001, 01/2004    C3-C6    There were no vitals filed for this visit.      Subjective Assessment - 06/06/15 1413    Subjective "I got that brace in the mail this morning. Thought maybe you could help me with it."   Patient is accompained by: Family member   Pertinent History MS, ACDF x 2   Limitations Standing;Walking   Patient Stated Goals improve balance; strength, gait.  "help me pick up my feet better."   Currently in Pain? Yes   Pain Score 2    Pain Location Coccyx   Pain Orientation Lower   Pain Descriptors / Indicators Aching   Pain Type Chronic pain   Pain Onset More than a month ago   Pain Frequency Constant   Aggravating Factors  walking and prolonged sitting   Pain Relieving Factors aleve of tramadol   Multiple Pain Sites No                         [facility] Adult PT Treatment/Exercise - 06/06/15 0001    Ambulation/Gait   Ambulation/Gait Yes   Ambulation/Gait Assistance 5: Supervision;4: Min guard   Ambulation/Gait Assistance Details (S) to min guard to recover from L toe catch x2   Ambulation Distance (Feet) 230 Feet   Assistive device Rollator;Other (Comment)  R WalkAid; L Foot Up brace   Gait Pattern Step-through pattern;Decreased hip/knee flexion - right;Decreased hip/knee flexion - left;Decreased dorsiflexion - right;Right circumduction;Right genu recurvatum;Narrow base of support;Poor foot clearance - right   Ambulation Surface Level;Indoor   Neuro Re-ed    Neuro Re-ed Details  --   Exercises   Other Exercises  Pt performed the following in tall kneeling without UE support with mirror anterior to pt for visual feedback, postural awareness: LLE forward/retro stepping x10 reps with cueing to decrease R drop; manual repositioning of LE's (R > L) to prevent excessive B hip ADD. LLE forward/retro stepping x5 reps to increase R stance stability, glute max/med activation during gait; lateral stepping in B directions for functional weight shifting, proximal control; tall kneeling <> quadruped x2 reps. Unable to tolerate half kneeling (either side) comfortably. Static tall kneeling 3 x 30-sec holds; progressing to tall kneeling squats 2 x10 reps with intermittent UE support due to anterior LOB with full hip ext. Tall kneeling activities focused on increasing proximal stability/control, promoting sensory awareness/proprioceptive input, and decreasing use of compensatory strategies during functional movement/gait.  Quadruped single LE extension x10 reps per side with cueing for proximal control. Cueing, reinforcement focused on quality of movement, avoidance of  compensatory movement patterns (e.g. R hip hiking, excessive lateral displacement of trunk) with effective return demo.                  PT Short Term Goals - 05/17/15 1158    PT SHORT TERM GOAL #1   Title independent with HEP (05/15/15)   Baseline Patient needs cues on technique for HEP.   Status Partially Met   PT SHORT TERM GOAL #2   Title perform BERG balance test with LTG to be written (05/15/15)   Status Achieved   PT SHORT TERM GOAL #3   Title  improve gait velocity to > 1.0 ft/sec for improved mobility with LRAD (05/15/15)   Baseline Improved from 0.63 ft/sec up to 0.91 ft/sec with rollater RW.   Status Partially Met           PT Long Term Goals - 05/01/15 1315    PT LONG TERM GOAL #1   Title verbalize understanding of fall prevention strategies to decrease risk of reinjury (06/12/15)   Status On-going   PT LONG TERM GOAL #2   Title improve gait velocity to >/= 1.3 ft/sec for improved mobility (06/12/15)   Status On-going   PT LONG TERM GOAL #3   Title ambulate > 300' with LRAD modified independent for improved function and mobility on level/paved indoor/outdoor surfaces (06/12/15)   Status On-going   PT LONG TERM GOAL #4   Title BERG:  > 38/56 (06/12/15)   Status Revised               Plan - 06/06/15 2114    Clinical Impression Statement Session continued to focus on increasing core/proximal stability/control using WB positions. Pt tolerated well. Continues to demo significant R genu recurvatum. Plan to contact orthotist to address; pt in agreement.    Rehab Potential Fair   PT Frequency 2x / week   PT Duration 8 weeks   PT Treatment/Interventions ADLs/Self Care Home Management;Electrical Stimulation;Moist Heat;Patient/family education;Neuromuscular re-education;Balance training;Therapeutic exercise;Therapeutic activities;Stair training;Functional mobility training;Gait training;DME Instruction;Orthotic Fit/Training;Energy conservation   PT Next Visit Plan Continue to address core and hip strengthening.    Recommended Other Services 5/17: Benjie Karvonen to contact [employer] to address significant R GR (? adjustment of WalkAid)   Consulted and Agree with Plan of Care Patient      Patient will benefit from skilled therapeutic intervention in order to improve the following deficits and impairments:  Abnormal gait, Decreased balance, Decreased safety awareness, Decreased mobility, Decreased strength, Pain, Decreased endurance,  Decreased coordination  Visit Diagnosis: Other abnormalities of gait and mobility  Muscle weakness (generalized)     Problem List Patient Active Problem List   Diagnosis Date Noted  . Muscle spasticity 10/31/2013  . Nerve root pain 01/31/2013  . Cervical myelopathy ([place]) 05/05/2011  . BP (high blood pressure) 05/05/2011  . Restless leg 05/05/2011  . Arthralgia, sacroiliac 05/05/2011    Billie Ruddy, PT, DPT [facility] [address] [place], [place], [address] Phone: [phone]   Fax:  [phone] 06/06/2015, 9:17 PM  Name: Amy Bray MRN: [id number] Date of Birth: 1945/10/17

## 2015-06-08 ENCOUNTER — Ambulatory Visit: Payer: Medicare Other | Admitting: Physical Therapy

## 2015-06-08 DIAGNOSIS — M6281 Muscle weakness (generalized): Secondary | ICD-10-CM | POA: Diagnosis not present

## 2015-06-08 DIAGNOSIS — R2689 Other abnormalities of gait and mobility: Secondary | ICD-10-CM

## 2015-06-09 NOTE — Therapy (Signed)
[facility] [address] [place], [place], [address] Phone: [phone]   Fax:  [phone]  Physical Therapy Treatment  Patient Details  Name: Amy Bray MRN: [id number] Date of Birth: [birth year] Referring Provider: Penni Bombard  Encounter Date: 06/08/2015      PT End of Session - 06/09/15 1804    Visit Number 13   Number of Visits 17   Date for PT Re-Evaluation 08/07/15   Authorization Type Medicare/AARP   PT Start Time 3557   PT Stop Time 1445   PT Time Calculation (min) 42 min   Activity Tolerance Patient tolerated treatment well   Behavior During Therapy [facility] for tasks assessed/performed      Past Medical History  Diagnosis Date  . Cervical myelopathy [facility])     Past Surgical History  Procedure Laterality Date  . Cervical fusion  01/2001, 01/2004    C3-C6    There were no vitals filed for this visit.      Subjective Assessment - 06/08/15 1412    Subjective Pt reports no significant changes, no falls. Wearing L Foot Up brace and R Walk Aid today.   Patient is accompained by: Family member   Pertinent History MS, ACDF x 2   Limitations Standing;Walking   Patient Stated Goals improve balance; strength, gait.  "help me pick up my feet better."   Currently in Pain? Yes   Pain Score 3    Pain Location Coccyx   Pain Orientation Lower   Pain Descriptors / Indicators Aching   Pain Type Chronic pain   Pain Onset More than a month ago   Pain Frequency Constant   Aggravating Factors  walking and prolonged sitting   Pain Relieving Factors aleve pr tramadol   Multiple Pain Sites No                         [facility] Adult PT Treatment/Exercise - 06/09/15 0001    Ambulation/Gait   Ambulation/Gait Yes   Ambulation/Gait Assistance 5: Supervision   Ambulation/Gait Assistance Details (S) due to intermittent toe catch (R > L). Cueing focused on increasing hip/knee flexion during LE  advancement.   Ambulation Distance (Feet) 525 Feet  x325' then x200'   Assistive device Rollator;Other (Comment)  R WalkAid; L Foot Up brace   Gait Pattern Step-through pattern;Decreased hip/knee flexion - right;Decreased hip/knee flexion - left;Decreased dorsiflexion - right;Right circumduction;Right genu recurvatum;Narrow base of support;Poor foot clearance - right  significant B genu valgum   Ambulation Surface Level;Unlevel;Indoor;Outdoor;Paved                  PT Short Term Goals - 06/08/15 1433    PT SHORT TERM GOAL #1   Title independent with HEP (05/15/15)   Baseline Patient needs cues on technique for HEP.   Status Partially Met   PT SHORT TERM GOAL #2   Title perform BERG balance test with LTG to be written (05/15/15)   Status Achieved   PT SHORT TERM GOAL #3   Title improve gait velocity to > 1.0 ft/sec for improved mobility with LRAD (05/15/15)   Baseline Improved from 0.63 ft/sec up to 0.91 ft/sec with rollater RW.   Status Partially Met           PT Long Term Goals - 06/08/15 1414    PT LONG TERM GOAL #1   Title verbalize understanding of fall prevention strategies to decrease risk of reinjury (06/12/15)  Baseline Met 5/19.    Status Achieved   PT LONG TERM GOAL #2   Title improve gait velocity to >/= 1.3 ft/sec for improved mobility   (Modified target date: 06/22/15)   Baseline 5/19: gait velocity = 1.12 ft/sec  Continue goal through renewed POC   Status Partially Met   PT LONG TERM GOAL #3   Title ambulate > 300' with LRAD modified independent for improved function and mobility on level/paved indoor/outdoor surfaces.  (Modified target date: 06/22/15)   Baseline 5/19: Requires supervision due to intermittent toe catch (bilateral, but R > L)  Continue goal through renewed POC   Status Partially Met   PT LONG TERM GOAL #4   Title BERG:  > 38/56 (06/12/15)   Baseline 5/19: Berg score = 42/56   Status Achieved               Plan - 06/09/15 1805     Clinical Impression Statement Berg score has improved from 31 to 42/56. Gait velocity has increased from 0.63 ft/sec to 1.12 ft/sec, but not to goal-level of 1.3 ft/sec. Pt continues to require supervision with ambulation due to B toe catch, which appears to be secondary to limited B hip/knee flexion during advancement. Pt will conttinue to benefit from skilled outpatient PT 2x/week for 2 additional weeks to continue to maximize safety/indepenedence with mobility and decrease fall risk.    Rehab Potential Fair   PT Frequency 2x / week   PT Duration 2 weeks   PT Treatment/Interventions ADLs/Self Care Home Management;Electrical Stimulation;Moist Heat;Patient/family education;Neuromuscular re-education;Balance training;Therapeutic exercise;Therapeutic activities;Stair training;Functional mobility training;Gait training;DME Instruction;Orthotic Fit/Training;Energy conservation   PT Next Visit Plan Continue to address core and hip strengthening. Consider adding hip flexor strengthening to HEP. Gait training with emphasis on hip/knee flexion during LE advancemnet.    Consulted and Agree with Plan of Care Patient      Patient will benefit from skilled therapeutic intervention in order to improve the following deficits and impairments:  Abnormal gait, Decreased balance, Decreased safety awareness, Decreased mobility, Decreased strength, Pain, Decreased endurance, Decreased coordination  Visit Diagnosis: Other abnormalities of gait and mobility - Plan: PT plan of care cert/re-cert  Muscle weakness (generalized) - Plan: PT plan of care cert/re-cert     Problem List Patient Active Problem List   Diagnosis Date Noted  . Muscle spasticity 10/31/2013  . Nerve root pain 01/31/2013  . Cervical myelopathy ([place]) 05/05/2011  . BP (high blood pressure) 05/05/2011  . Restless leg 05/05/2011  . Arthralgia, sacroiliac 05/05/2011   Billie Ruddy, PT, DPT [facility] [address] [place], [place], [address] Phone: [phone]   Fax:  [phone] 06/09/2015, 6:15 PM  Name: [name] MRN: [id number] Date of Birth: Feb 28, 1945

## 2015-06-11 ENCOUNTER — Ambulatory Visit: Payer: Medicare Other | Admitting: Physical Therapy

## 2015-06-11 DIAGNOSIS — M6281 Muscle weakness (generalized): Secondary | ICD-10-CM

## 2015-06-11 DIAGNOSIS — R2689 Other abnormalities of gait and mobility: Secondary | ICD-10-CM | POA: Diagnosis not present

## 2015-06-11 NOTE — Therapy (Signed)
[facility] [address] [place], [place], [address] Phone: [phone]   Fax:  [phone]  Physical Therapy Treatment  Patient Details  Name: Amy Bray MRN: [id number] Date of Birth: March 03, 1945 Referring Provider: Penni Bombard  Encounter Date: 06/11/2015      PT End of Session - 06/11/15 1309    Visit Number 14   Number of Visits 17   Date for PT Re-Evaluation 08/07/15   Authorization Type Medicare/AARP   PT Start Time 1230   PT Stop Time 1309   PT Time Calculation (min) 39 min   Activity Tolerance Patient tolerated treatment well   Behavior During Therapy [facility] for tasks assessed/performed      Past Medical History  Diagnosis Date  . Cervical myelopathy [facility])     Past Surgical History  Procedure Laterality Date  . Cervical fusion  01/2001, 01/2004    C3-C6    There were no vitals filed for this visit.      Subjective Assessment - 06/11/15 1233    Subjective doing well; exercises going well.  wearing L foot up and R walk aid.   Patient is accompained by: Family member   Pertinent History MS, ACDF x 2   Limitations Standing;Walking   Patient Stated Goals improve balance; strength, gait.  "help me pick up my feet better."   Currently in Pain? Yes   Pain Score 3    Pain Location Coccyx   Pain Orientation Lower   Pain Descriptors / Indicators Aching   Pain Type Chronic pain   Pain Onset More than a month ago   Pain Frequency Constant   Aggravating Factors  walking and prolonged sitting   Pain Relieving Factors aleve and tramadol                         [facility] Adult PT Treatment/Exercise - 06/11/15 1238    Exercises   Exercises Lumbar   Lumbar Exercises: Seated   Other Seated Lumbar Exercises seated on dynadisc and blue pball: pelvic rocking forwards/laterally x10, marching x 10 with 2#, LAQ alt x10 with 2#, trunk rotation x 10   Lumbar Exercises: Supine   Ab Set 10  reps;5 seconds   Clam 10 reps   Clam Limitations with ab set; mod cues to slow down and for technique   Bent Knee Raise 10 reps   Bent Knee Raise Limitations wtih ab set   Knee/Hip Exercises: Standing   Hip Flexion Both;10 reps;Knee bent;Knee straight   Hip Abduction Both;10 reps;Knee straight                  PT Short Term Goals - 06/08/15 1433    PT SHORT TERM GOAL #1   Title independent with HEP (05/15/15)   Baseline Patient needs cues on technique for HEP.   Status Partially Met   PT SHORT TERM GOAL #2   Title perform BERG balance test with LTG to be written (05/15/15)   Status Achieved   PT SHORT TERM GOAL #3   Title improve gait velocity to > 1.0 ft/sec for improved mobility with LRAD (05/15/15)   Baseline Improved from 0.63 ft/sec up to 0.91 ft/sec with rollater RW.   Status Partially Met           PT Long Term Goals - 06/08/15 1414    PT LONG TERM GOAL #1   Title verbalize understanding of fall prevention strategies to decrease  risk of reinjury (06/12/15)   Baseline Met 5/19.    Status Achieved   PT LONG TERM GOAL #2   Title improve gait velocity to >/= 1.3 ft/sec for improved mobility   (Modified target date: 06/22/15)   Baseline 5/19: gait velocity = 1.12 ft/sec  Continue goal through renewed POC   Status Partially Met   PT LONG TERM GOAL #3   Title ambulate > 300' with LRAD modified independent for improved function and mobility on level/paved indoor/outdoor surfaces.  (Modified target date: 06/22/15)   Baseline 5/19: Requires supervision due to intermittent toe catch (bilateral, but R > L)  Continue goal through renewed POC   Status Partially Met   PT LONG TERM GOAL #4   Title BERG:  > 38/56 (06/12/15)   Baseline 5/19: Berg score = 42/56   Status Achieved               Plan - 06/11/15 1310    Clinical Impression Statement Session focused on hip and core strengthening to improve gait.  Pt needs mod cues for core activation.  Will continue to  benefit from PT to maximize function.   PT Next Visit Plan Continue to address core and hip strengthening. Consider adding hip flexor strengthening to HEP. Gait training with emphasis on hip/knee flexion during LE advancemnet.       Patient will benefit from skilled therapeutic intervention in order to improve the following deficits and impairments:     Visit Diagnosis: Other abnormalities of gait and mobility  Muscle weakness (generalized)     Problem List Patient Active Problem List   Diagnosis Date Noted  . Muscle spasticity 10/31/2013  . Nerve root pain 01/31/2013  . Cervical myelopathy ([place]) 05/05/2011  . BP (high blood pressure) 05/05/2011  . Restless leg 05/05/2011  . Arthralgia, sacroiliac 05/05/2011   Laureen Abrahams, PT, DPT 06/11/2015 1:11 PM  [facility] [address] [place], [place], [address] Phone: [phone]   Fax:  [phone]  Name: Amy Bray MRN: [id number] Date of Birth: [birth year]

## 2015-06-12 DIAGNOSIS — H35363 Drusen (degenerative) of macula, bilateral: Secondary | ICD-10-CM | POA: Diagnosis not present

## 2015-06-13 ENCOUNTER — Ambulatory Visit: Payer: Medicare Other | Admitting: Physical Therapy

## 2015-06-13 DIAGNOSIS — M6281 Muscle weakness (generalized): Secondary | ICD-10-CM | POA: Diagnosis not present

## 2015-06-13 DIAGNOSIS — R2689 Other abnormalities of gait and mobility: Secondary | ICD-10-CM

## 2015-06-13 NOTE — Therapy (Signed)
[facility] [address] [place], [place], [address] Phone: [phone]   Fax:  [phone]  Physical Therapy Treatment  Patient Details  Name: Amy Bray MRN: [id number] Date of Birth: 06/23/1945 Referring Provider: Penni Bombard  Encounter Date: 06/13/2015      PT End of Session - 06/13/15 1452    Visit Number 15   Number of Visits 17   Date for PT Re-Evaluation 08/07/15   Authorization Type Medicare/AARP   PT Start Time 1400   PT Stop Time 1440   PT Time Calculation (min) 40 min   Activity Tolerance Patient tolerated treatment well   Behavior During Therapy [facility] for tasks assessed/performed      Past Medical History  Diagnosis Date  . Cervical myelopathy [facility])     Past Surgical History  Procedure Laterality Date  . Cervical fusion  01/2001, 01/2004    C3-C6    There were no vitals filed for this visit.      Subjective Assessment - 06/13/15 1400    Subjective no complaints; doing well.   Patient is accompained by: Family member   Pertinent History MS, ACDF x 2   Limitations Standing;Walking   Patient Stated Goals improve balance; strength, gait.  "help me pick up my feet better."   Currently in Pain? Yes   Pain Score 3    Pain Location Coccyx   Pain Orientation Lower   Pain Descriptors / Indicators Aching   Pain Type Chronic pain   Pain Onset More than a month ago   Pain Frequency Constant   Aggravating Factors  walking and prolonged sitting   Pain Relieving Factors aleve and tramadol                         [facility] Adult PT Treatment/Exercise - 06/13/15 1404    Lumbar Exercises: Quadruped   Single Arm Raise Right;Left;10 reps   Straight Leg Raise 10 reps   Opposite Arm/Leg Raise Right arm/Left leg;Left arm/Right leg;5 reps   Plank modified on knees 10 x 10 sec   Knee/Hip Exercises: Aerobic   Nustep L3 x 5 min; 4 extremities with cues to decr adduction   Knee/Hip Exercises: Standing   Knee Flexion Both;15 reps   Knee Flexion Limitations 2# with cues to decrease substitution   Hip Abduction Both;15 reps;Knee straight   Abduction Limitations 2# with cues for technique   Hip Extension Both;15 reps;Knee straight   Extension Limitations 2#                PT Education - 06/13/15 1452    Education provided Yes   Education Details HEP   Person(s) Educated Patient   Methods Explanation;Handout;Demonstration   Comprehension Verbalized understanding;Returned demonstration;Need further instruction          PT Short Term Goals - 06/08/15 1433    PT SHORT TERM GOAL #1   Title independent with HEP (05/15/15)   Baseline Patient needs cues on technique for HEP.   Status Partially Met   PT SHORT TERM GOAL #2   Title perform BERG balance test with LTG to be written (05/15/15)   Status Achieved   PT SHORT TERM GOAL #3   Title improve gait velocity to > 1.0 ft/sec for improved mobility with LRAD (05/15/15)   Baseline Improved from 0.63 ft/sec up to 0.91 ft/sec with rollater RW.   Status Partially Met  PT Long Term Goals - 06/08/15 1414    PT LONG TERM GOAL #1   Title verbalize understanding of fall prevention strategies to decrease risk of reinjury (06/12/15)   Baseline Met 5/19.    Status Achieved   PT LONG TERM GOAL #2   Title improve gait velocity to >/= 1.3 ft/sec for improved mobility   (Modified target date: 06/22/15)   Baseline 5/19: gait velocity = 1.12 ft/sec  Continue goal through renewed POC   Status Partially Met   PT LONG TERM GOAL #3   Title ambulate > 300' with LRAD modified independent for improved function and mobility on level/paved indoor/outdoor surfaces.  (Modified target date: 06/22/15)   Baseline 5/19: Requires supervision due to intermittent toe catch (bilateral, but R > L)  Continue goal through renewed POC   Status Partially Met   PT LONG TERM GOAL #4   Title BERG:  > 38/56 (06/12/15)   Baseline  5/19: Berg score = 42/56   Status Achieved               Plan - 06/13/15 1452    Clinical Impression Statement Pt tolerated increased core strengthening today.  Will continue to benefit from PT to maximize function.   PT Next Visit Plan Continue to address core and hip strengthening. Consider adding hip flexor strengthening to HEP. Gait training with emphasis on hip/knee flexion during LE advancemnet.       Patient will benefit from skilled therapeutic intervention in order to improve the following deficits and impairments:     Visit Diagnosis: Other abnormalities of gait and mobility  Muscle weakness (generalized)     Problem List Patient Active Problem List   Diagnosis Date Noted  . Muscle spasticity 10/31/2013  . Nerve root pain 01/31/2013  . Cervical myelopathy ([place]) 05/05/2011  . BP (high blood pressure) 05/05/2011  . Restless leg 05/05/2011  . Arthralgia, sacroiliac 05/05/2011   Laureen Abrahams, PT, DPT 06/13/2015 2:54 PM  [facility] [address] [place], [place], [address] Phone: [phone]   Fax:  [phone]  Name: LAKISHA PEYSER MRN: [id number] Date of Birth: 06-02-1945

## 2015-06-13 NOTE — Patient Instructions (Signed)
EXTENSION: Standing (Active)    Stand, both feet flat. Draw right leg behind body as far as possible. Use __2_ lbs. Complete __1_ sets of _15__ repetitions. Perform _1-2__ sessions per day.  http://gtsc.exer.us/77   Copyright  VHI. All rights reserved.    FUNCTIONAL MOBILITY: Marching - Standing    March in place by lifting left leg up, then right. Alternate.  Use 2 lbs weights.   _15__ reps per set, _1-2__ sets per day.  Hold onto a support.  Copyright  VHI. All rights reserved.

## 2015-06-20 ENCOUNTER — Ambulatory Visit: Payer: Medicare Other | Admitting: Physical Therapy

## 2015-06-20 DIAGNOSIS — M6281 Muscle weakness (generalized): Secondary | ICD-10-CM

## 2015-06-20 DIAGNOSIS — R2689 Other abnormalities of gait and mobility: Secondary | ICD-10-CM

## 2015-06-20 NOTE — Patient Instructions (Addendum)
Theraband progression (from least to most resistance): yellow >> red >> green.  Abduction: Clam (Eccentric) - Side-Lying    Lie on side with knees bent. Lift top knee, keeping feet together. Keep trunk steady - don't let hips roll backward as you're raising your top knee. Slowly lower for 3-5 seconds. Perform 20 reps on right and 15 reps on left. Perform __2-3__ times per day.   * Progress by 2-3 reps, as able/tolerated. Once you're able to easily perform 20 reps with good mechanics, loop yellow band around knees for resistance, and decrease reps back to 8 to 10. From there, increase reps (using yellow band) by 2-3 reps at a time.     Abductor Strength: Bridge Pose (Strap)    Loop GREEN resistance band around knees. Press knees outward into strap as you pick your hips up form the mat. Hold for 1-2 seconds, then slowly lower. Perform 12 reps, __2-3__ times per day. *Progress by increasing by 2-3 reps at a time until you're able to perform 20 easily.   Bracing With Leg March (Hook-Lying)    Lie on back and brace abdominal muscle we talked about. To find to this muscle, place hands on hip pointer bones and walk fingers 3 finger-widths inward (toward belly button) and 2-3 finger-widths downward. Think about pulling your belly button toward your backbone; you should be able to feel the muscle under your finger tips. Alternating legs, lift foot slightly from floor. Repeat __20_  times. Perform this exercise twice per day.  *Progress by raising opposite arm at same time as leg (as below).      Log Roll    Lying on back, bend both knees and place arm furthest from edge of bed across chest. Roll onto your side as one unit. Bring both legs off edge of bed and push up from side lying to seated. Likewise, get into bed by lowering onto your shoulder with both legs bent/stacked. Then roll from side lying onto back.   To progress [place] exercise program (in red folder), progress reps by 2-3  reps at a time, as safe/tolerated (make sure mechanics are good). When able to perform weighted exercises for 20 reps, increase weight by 1 lb. When increasing weight, decrease reps initially to 8-10 reps, as tolerated.

## 2015-06-20 NOTE — Therapy (Signed)
[facility] [address] [place], [place], [address] Phone: [phone]   Fax:  [phone]  Physical Therapy Treatment  Patient Details  Name: Amy Bray MRN: [id number] Date of Birth: Sep 09, 1945 Referring Provider: Penni Bombard  Encounter Date: 06/20/2015      PT End of Session - 06/20/15 1639    Visit Number 16   Number of Visits 17   Date for PT Re-Evaluation 08/07/15   Authorization Type Medicare/AARP   PT Start Time 1321   PT Stop Time 1359   PT Time Calculation (min) 38 min   Activity Tolerance Patient tolerated treatment well   Behavior During Therapy [facility] for tasks assessed/performed      Past Medical History  Diagnosis Date  . Cervical myelopathy [facility])     Past Surgical History  Procedure Laterality Date  . Cervical fusion  01/2001, 01/2004    C3-C6    There were no vitals filed for this visit.      Subjective Assessment - 06/20/15 1323    Subjective "I've been doing all those exercises every day, and it seems like a lot. My back is hurting more."  Pt reports no falls, no significant changes. Pt expressing desire to continue exercising when this episode of PT is complete.   Pertinent History MS, ACDF x 2   Limitations Standing;Walking   Patient Stated Goals improve balance; strength, gait.  "help me pick up my feet better."   Currently in Pain? Yes   Pain Score 5    Pain Location Coccyx   Pain Orientation Lower   Pain Descriptors / Indicators Aching   Pain Type Chronic pain   Pain Onset More than a month ago   Pain Frequency Constant   Aggravating Factors  walking and prolonged sitting   Pain Relieving Factors aleve and tramadol   Multiple Pain Sites No                         [facility] Adult PT Treatment/Exercise - 06/20/15 0001    Exercises   Exercises Lumbar;Knee/Hip   Lumbar Exercises: Supine   Clam 15 reps;20 reps   Clam Limitations 15 reps on R, 20  reps on L; cueing for alignment/technique   Bent Knee Raise 20 reps   Bent Knee Raise Limitations with concurrent TA activation   Bridge Non-compliant;Other (comment)   Bridge Limitations progressed to green Tband around B knees for hip ABD activation. x12 reps (to pt fatigue)   Straight Leg Raise 15 reps   Straight Leg Raises Limitations x15 reps per side with cueing for technique (to prevent quad lag)   Other Supine Lumbar Exercises Supine: alternating B reciprocal LE flexion/extension x8 reps with max cueing for coordination of exercise.                PT Education - 06/20/15 1632    Education provided Yes   Education Details HEP: progressed and discussed, provided paper handout for further progression beyond DC from PT.  Talked to patient about importance of energy conservation with MS, as patient has been performing hip/core strengthening HEP in addition to [place] daily. Recommending pt separate said exercises into 3 groups and perform on 3 separate days.   Person(s) Educated Patient   Methods Explanation;Demonstration;Handout   Comprehension Verbalized understanding;Returned demonstration          PT Short Term Goals - 06/08/15 1433    PT SHORT TERM GOAL #  1   Title independent with HEP (05/15/15)   Baseline Patient needs cues on technique for HEP.   Status Partially Met   PT SHORT TERM GOAL #2   Title perform BERG balance test with LTG to be written (05/15/15)   Status Achieved   PT SHORT TERM GOAL #3   Title improve gait velocity to > 1.0 ft/sec for improved mobility with LRAD (05/15/15)   Baseline Improved from 0.63 ft/sec up to 0.91 ft/sec with rollater RW.   Status Partially Met           PT Long Term Goals - 06/08/15 1414    PT LONG TERM GOAL #1   Title verbalize understanding of fall prevention strategies to decrease risk of reinjury (06/12/15)   Baseline Met 5/19.    Status Achieved   PT LONG TERM GOAL #2   Title improve gait velocity to >/= 1.3  ft/sec for improved mobility   (Modified target date: 06/22/15)   Baseline 5/19: gait velocity = 1.12 ft/sec  Continue goal through renewed POC   Status Partially Met   PT LONG TERM GOAL #3   Title ambulate > 300' with LRAD modified independent for improved function and mobility on level/paved indoor/outdoor surfaces.  (Modified target date: 06/22/15)   Baseline 5/19: Requires supervision due to intermittent toe catch (bilateral, but R > L)  Continue goal through renewed POC   Status Partially Met   PT LONG TERM GOAL #4   Title BERG:  > 38/56 (06/12/15)   Baseline 5/19: Berg score = 42/56   Status Achieved               Plan - 06/20/15 1639    Clinical Impression Statement Session focused on progressing current HEP and providing education on how to safely progress HEP after DC from PT. PT and pt discussed DC from PT; education emphasized the fact that patient is proficient in HEP and has all of tools necessary to continue graded exercise. Pt in full agreement.   Rehab Potential Fair   PT Frequency 2x / week   PT Duration 2 weeks   PT Treatment/Interventions ADLs/Self Care Home Management;Electrical Stimulation;Moist Heat;Patient/family education;Neuromuscular re-education;Balance training;Therapeutic exercise;Therapeutic activities;Stair training;Functional mobility training;Gait training;DME Instruction;Orthotic Fit/Training;Energy conservation   PT Next Visit Plan Check remaining goal and plan to DC. * FOTO.  Provide patient with information on [employer], [facility] (NuStep), Silver Sneakers   Consulted and Agree with Plan of Care Patient      Patient will benefit from skilled therapeutic intervention in order to improve the following deficits and impairments:  Abnormal gait, Decreased balance, Decreased safety awareness, Decreased mobility, Decreased strength, Pain, Decreased endurance, Decreased coordination  Visit Diagnosis: Other abnormalities of gait and mobility  Muscle  weakness (generalized)     Problem List Patient Active Problem List   Diagnosis Date Noted  . Muscle spasticity 10/31/2013  . Nerve root pain 01/31/2013  . Cervical myelopathy ([place]) 05/05/2011  . BP (high blood pressure) 05/05/2011  . Restless leg 05/05/2011  . Arthralgia, sacroiliac 05/05/2011    Billie Ruddy, PT, DPT [facility] [address] [place], [place], [address] Phone: [phone]   Fax:  [phone] 06/20/2015, 4:43 PM  Name: Amy Bray MRN: [id number] Date of Birth: Oct 30, 1945

## 2015-06-22 ENCOUNTER — Ambulatory Visit: Payer: Medicare Other | Attending: Diagnostic Neuroimaging | Admitting: Physical Therapy

## 2015-06-22 DIAGNOSIS — M6281 Muscle weakness (generalized): Secondary | ICD-10-CM | POA: Diagnosis not present

## 2015-06-22 DIAGNOSIS — R2689 Other abnormalities of gait and mobility: Secondary | ICD-10-CM | POA: Diagnosis not present

## 2015-06-22 NOTE — Therapy (Signed)
[facility] [address] [place], , [address] Phone: [phone]   Fax:  [phone]  Physical Therapy Treatment  Patient Details  Name: Amy Bray MRN: [id number] Date of Birth: 03/02/1945 Referring Provider: PENUMALLI, VIKRAM R  Encounter Date: 06/22/2015      PT End of Session - 06/22/15 1520    Visit Number 17   Number of Visits 17   Date for PT Re-Evaluation 08/07/15   Authorization Type Medicare/AARP   PT Start Time 1316   PT Stop Time 1342   PT Time Calculation (min) 26 min   Activity Tolerance Patient tolerated treatment well   Behavior During Therapy W[facility] for tasks assessed/performed      Past Medical History  Diagnosis Date  . Cervical myelopathy ([facility])     Past Surgical History  Procedure Laterality Date  . Cervical fusion  01/2001, 01/2004    C3-C6    There were no vitals filed for this visit.      Subjective Assessment - 06/22/15 1337    Patient Stated Goals improve balance; strength, gait.  "help me pick up my feet better."   Currently in Pain? Yes   Pain Score 5    Pain Location Coccyx   Pain Orientation Lower   Pain Descriptors / Indicators Aching   Pain Type Chronic pain   Pain Onset More than a month ago   Pain Frequency Constant   Aggravating Factors  walking and prolonged sitting   Pain Relieving Factors aleve and tramadol   Multiple Pain Sites No                         [facility] Adult PT Treatment/Exercise - 06/22/15 0001    Ambulation/Gait   Ambulation/Gait Yes   Ambulation/Gait Assistance 6: Modified independent (Device/Increase time)   Ambulation Distance (Feet) 312 Feet   Assistive device Rollator;Other (Comment)  L WalkAid; R Foot Up brace   Gait Pattern Step-through pattern;Decreased hip/knee flexion - right;Decreased hip/knee flexion - left;Decreased dorsiflexion - right;Right circumduction;Right genu recurvatum;Narrow base of support  B  genu valgum   Ambulation Surface Level;Unlevel;Indoor;Outdoor;Paved   Ramp 6: Modified independent (Device)   Ramp Details (indicate cue type and reason) using rollator, R Foot Up brace, and L Walk Aid                PT Education - 06/22/15 1356    Education provided Yes   Education Details PT goals, findings, progress, and DC plan. Explained and demonstrated how to safely get onto/off of NuStep, how to setup and adjust NuStep based on goal (strengthening vs. CV endurance). Discussed energy conservation techniques (e.g. sitting to shower, get ready in morning) as welll as resources offered by National MS Society.  Per pt request, adjusted Foot Up brace on shoe (pt washed shoes and unable to correctly don brace on laces) and explained to pt how to effectively adjust brace in future.    Person(s) Educated Patient   Methods Explanation;Demonstration;Verbal cues   Comprehension Verbalized understanding;Returned demonstration          PT Short Term Goals - 06/22/15 1519    PT SHORT TERM GOAL #1   Title independent with HEP (05/15/15)   Baseline Met 5/31.   Status Achieved   PT SHORT TERM GOAL #2   Title perform BERG balance test with LTG to be written (05/15/15)   Status Achieved   PT SHORT TERM GOAL #3     Title improve gait velocity to > 1.0 ft/sec for improved mobility with LRAD (05/15/15)   Baseline 1.12 ft/sec   Status Achieved           PT Long Term Goals - 07-03-2015 1519    PT LONG TERM GOAL #1   Title verbalize understanding of fall prevention strategies to decrease risk of reinjury (06/12/15)   Baseline Met 5/19.    Status Achieved   PT LONG TERM GOAL #2   Title improve gait velocity to >/= 1.3 ft/sec for improved mobility   (Modified target date: 2015/07/03)   Baseline 5/19: gait velocity = 1.12 ft/sec   Status Partially Met   PT LONG TERM GOAL #3   Title ambulate > 300' with LRAD modified independent for improved function and mobility on level/paved indoor/outdoor  surfaces.  (Modified target date: 03-Jul-2015)   Baseline Met 2022-07-03.   Status Achieved   PT LONG TERM GOAL #4   Title BERG:  > 38/56 (06/12/15)   Baseline 5/19: Berg score = 42/56   Status Achieved               Plan - 03-Jul-2015 1520    Clinical Impression Statement Pt has met/partially met all short and long term goals and therefore will be discharged from outpatient PT at this time. Pt planning to continue exercising at [facility] and is able to safely utilize NuStep machine. Pt verbalized understandin and was in full agreement with DC plan.    Consulted and Agree with Plan of Care Patient      Patient will benefit from skilled therapeutic intervention in order to improve the following deficits and impairments:     Visit Diagnosis: Other abnormalities of gait and mobility  Muscle weakness (generalized)       G-Codes - Jul 03, 2015 1352    Functional Assessment Tool Used gait velocity = 1.12 ft/sec with rollator   Functional Limitation Mobility: Walking and moving around   Mobility: Walking and Moving Around Goal Status [phone]) At least 20 percent but less than 40 percent impaired, limited or restricted   Mobility: Walking and Moving Around Discharge Status [phone]) At least 20 percent but less than 40 percent impaired, limited or restricted     PHYSICAL THERAPY DISCHARGE SUMMARY  Visits from Start of Care: 17  Current functional level related to goals / functional outcomes: See above goals and outcomes.   Remaining deficits: Pt continues to demonstrate weakness, postural/gait impairments associated with MS. However, safety and independence with functional mobility have improved markedly during this episode of PT and pt is proficient with HEP to address remaining impairments.   Education / Equipment: HEP and progression; National MS Society resources; recommendation for rollator and education of safe use of rollator for all mobility; energy conservation techniques;  recommendation for FootUp brace.  Plan: Patient agrees to discharge.  Patient goals were met. Patient is being discharged due to meeting the stated rehab goals.  ?????       Problem List Patient Active Problem List   Diagnosis Date Noted  . Muscle spasticity 10/31/2013  . Nerve root pain 01/31/2013  . Cervical myelopathy ([place]) 05/05/2011  . BP (high blood pressure) 05/05/2011  . Restless leg 05/05/2011  . Arthralgia, sacroiliac 05/05/2011   Billie Ruddy, PT, DPT [facility] [address] [place], [place], [address] Phone: [phone]   Fax:  [phone] 07-03-15, 3:26 PM   Name: SHERRICA NIEHAUS MRN: [id number] Date of Birth: 1945/06/26

## 2015-07-02 DIAGNOSIS — G35 Multiple sclerosis: Secondary | ICD-10-CM | POA: Diagnosis not present

## 2015-07-04 ENCOUNTER — Encounter: Payer: Self-pay | Admitting: Diagnostic Neuroimaging

## 2015-07-04 ENCOUNTER — Ambulatory Visit (INDEPENDENT_AMBULATORY_CARE_PROVIDER_SITE_OTHER): Payer: Medicare Other | Admitting: Diagnostic Neuroimaging

## 2015-07-04 VITALS — BP 107/71 | HR 101 | Wt 116.8 lb

## 2015-07-04 DIAGNOSIS — R29898 Other symptoms and signs involving the musculoskeletal system: Secondary | ICD-10-CM

## 2015-07-04 DIAGNOSIS — G35 Multiple sclerosis: Secondary | ICD-10-CM

## 2015-07-04 NOTE — Progress Notes (Signed)
[facility]  PATIENT: Amy Bray DOB: 1945-07-18  REFERRING CLINICIAN:  HISTORY FROM: patient and husband  REASON FOR VISIT: follow up   HISTORICAL  CHIEF COMPLAINT:  Chief Complaint  Patient presents with  . Multiple Sclerosis    rm 6, husband- Lanetta Inch, 03/2015 MRI Brain, JCV -neg, "completed 17 PT sessions- very helpful"  . Follow-up    3 month    HISTORY OF PRESENT ILLNESS:   UPDATE 07/04/15: Since last visit, doing well. Tolerating tysabri and ampyra.  UPDATE 04/02/15: Since last visit, doing well. Tolerating tysabri and ampyra. Back on nortriptyline (is helping burning pain in feet).   UPDATE 01/03/15: Since last visit, doing better on ampyra (better gait and walking speed). More foot/calf tingling burning pain x 1-2 months. Now with Dr. Andree Elk pain mgmt for back injections x 2, which is helping.  UPDATE 10/02/14: Since last visit, doing well. Requesting transition of care to [place] for pain mgmt; previously at [facility] with Dr. Oliva Bustard.  UPDATE 05/22/14: Since last visit, doing well. On tysabri since March 2016. Fatigue and bladder control much improved. Gait is stable.   UPDATE 02/20/14: Since last visit, had reviewed lab results and now ready to start MS medication. Now would like to start tysabri.  UPDATE 01/09/14: Since last visit, no new events symptoms. LP results reviewed. Didn't get lab work at last visit for MS mimics.   UPDATE 12/07/13: Patient is stable with symptoms. MRI scans reviewed. Continues to have weakness in legs and balance difficulty.  UPDATE 11/21/13: 70 year old right-handed female here for evaluation of lower extremity spasms and weakness. 2002 patient developed onset of right leg stiffness, spasm, weakness. Patient was diagnosed with cervical spine disease, bone spur ridging, and treated with anterior cervical discectomy and fusion in 2003. Following this her right leg weakness and spasms significantly improved. However one  year later her symptoms return. Patient followed up with several surgeons and ultimately had a second cervical spine surgery in 2006. Following this patient had slight improvement of right leg however within several months her weakness, spasms and gait continued to decline. Patient transitioned needing a cane to walk. 2013 patient was evaluated by a neurologist in [place], to evaluate her right leg weakness. Apparently patient had a "multiple sclerosis" workup including MRI of the brain and extensive blood testing. Patient also had EMG nerve conduction studies looking for peroneal neuropathy but apparently all studies were normal. MRI of the brain did show a right medulla lesion which was reported as a stroke. Over the past 1 year patient has developed new onset left leg weakness, spasm, stiffness. Patient followed up with orthopedic surgery who then referred patient to me for evaluation. Last MRI of cervical spine was in 2006 at [facility]. No bowel or bladder incontinence. No significant numbness or tingling. No weakness in upper extremities. No vision loss, vision changes, slurred speech or trouble talking.   REVIEW OF SYSTEMS: Full 14 system review of systems performed and negative except for ringing in ears freq urination walking diff restless legs.    ALLERGIES: No Known Allergies  HOME MEDICATIONS: Outpatient Prescriptions Prior to Visit  Medication Sig Dispense Refill  . acetaminophen (TYLENOL) 325 MG tablet Take by mouth.    Marland Kitchen alendronate (FOSAMAX) 70 MG tablet Take 1 tablet by mouth once a week.    . baclofen (LIORESAL) 10 MG tablet Take 1 tablet (10 mg total) by mouth 4 (four) times daily as needed for muscle spasms. 120 each 6  .  Cranberry-Vitamin C-Vitamin E (CRANBERRY PLUS VITAMIN C) 4200-20-3 MG-MG-UNIT CAPS Take by mouth.    . dalfampridine (AMPYRA) 10 MG TB12 Take 1 tablet (10 mg total) by mouth 2 (two) times daily. 60 tablet 6  . lidocaine (XYLOCAINE) 5 % ointment Apply 1  application topically as needed. 03/13/15 prescription faxed to [facility]    . lisinopril (PRINIVIL,ZESTRIL) 10 MG tablet Take 1 tablet by mouth daily.    . natalizumab (TYSABRI) 300 MG/15ML injection Inject into the vein.    Marland Kitchen nortriptyline (PAMELOR) 50 MG capsule Take 1 capsule by mouth daily.    . traMADol-acetaminophen (ULTRACET) 37.5-325 MG per tablet Take 1 tablet by mouth daily as needed.     No facility-administered medications prior to visit.    PAST MEDICAL HISTORY: Past Medical History  Diagnosis Date  . Cervical myelopathy ([place])     PAST SURGICAL HISTORY: Past Surgical History  Procedure Laterality Date  . Cervical fusion  01/2001, 01/2004    C3-C6    FAMILY HISTORY: Family History  Problem Relation Age of Onset  . Transient ischemic attack Mother   . Pancreatic cancer Father     SOCIAL HISTORY:  Social History   Social History  . Marital Status: Married    Spouse Name: Bailee Martine  . Number of Children: 0  . Years of Education: College   Occupational History  .  Other    n/a   Social History Main Topics  . Smoking status: Former Smoker -- 1.00 packs/day for 15 years    Types: Cigarettes    Quit date: 01/21/1983  . Smokeless tobacco: Never Used  . Alcohol Use: 0.0 oz/week    0 Standard drinks or equivalent per week     Comment: occasionally- wine  . Drug Use: No  . Sexual Activity: Not on file   Other Topics Concern  . Not on file   Social History Narrative   Patient lives at home with her spouse.   Caffeine Use-none     PHYSICAL EXAM  Filed Vitals:   07/04/15 1346  BP: 107/71  Pulse: 101  Weight: 116 lb 12.8 oz (52.98 kg)    Not recorded     No exam data present   Body mass index is 22.08 kg/(m^2).  GENERAL EXAM: Patient is in no distress; well developed, nourished and groomed; neck is supple  CARDIOVASCULAR: Regular rate and rhythm, no murmurs, no carotid bruits  NEUROLOGIC: MENTAL STATUS: awake, alert, language  fluent, comprehension intact, naming intact, fund of knowledge appropriate CRANIAL NERVE: pupils equal and reactive to light, visual fields full to confrontation, extraocular muscles intact, no nystagmus, facial sensation and strength symmetric, hearing intact, palate elevates symmetrically, uvula midline, shoulder shrug symmetric, tongue midline. MOTOR: normal bulk and tone in BUE; MILD INCREASED EXT TONE IN BLE; BLE (HF 4, KE 5 KF 4 DF 5) SENSORY: DECR VIB AT TOES  COORDINATION: finger-nose-finger, fine finger movements --> MINIMAL ACTION TREMOR IN BUE REFLEXES: BUE 3; BLE (KNEES 3, ANKLES 2) GAIT/STATION: SPASTIC, PARAPARETIC GAIT; UNSTEADY; USES WALKER      DIAGNOSTIC DATA (LABS, IMAGING, TESTING) - I reviewed patient records, labs, notes, testing and imaging myself where available.  Lab Results  Component Value Date   WBC 6.7 04/02/2015   HCT 37.5 04/02/2015   MCV 94 04/02/2015   PLT 299 04/02/2015      Component Value Date/Time   NA 144 04/02/2015 1438   K 3.8 04/02/2015 1438   CL 101 04/02/2015 1438  CO2 27 04/02/2015 1438   GLUCOSE 122* 04/02/2015 1438   BUN 14 04/02/2015 1438   CREATININE 0.69 04/02/2015 1438   CALCIUM 9.7 04/02/2015 1438   PROT 6.2 04/02/2015 1438   ALBUMIN 4.2 04/02/2015 1438   AST 16 04/02/2015 1438   ALT 18 04/02/2015 1438   ALKPHOS 36* 04/02/2015 1438   BILITOT <0.2 04/02/2015 1438   GFRNONAA 90 04/02/2015 1438   GFRAA 103 04/02/2015 1438   No results found for: CHOL, HDL, LDLCALC, LDLDIRECT, TRIG, CHOLHDL No results found for: HGBA1C No results found for: VITAMINB12 No results found for: TSH  VIT D, 25-HYDROXY  Date Value Ref Range Status  01/03/2015 36.8 30.0 - 100.0 ng/mL Final    Comment:    Vitamin D deficiency has been defined by the [place] practice guideline as a level of serum 25-OH vitamin D less than 20 ng/mL (1,2). The Endocrine Society went on to further define vitamin  D insufficiency as a level between 21 and 29 ng/mL (2). 1. IOM (Institute of Medicine). 2010. Dietary reference    intakes for calcium and D. [place]: The    [employer]. 2. Holick MF, Binkley [place], Bischoff-Ferrari HA, et al.    Evaluation, treatment, and prevention of vitamin D    deficiency: an Endocrine Society clinical practice    guideline. JCEM. 2011 Jul; 96(7):1911-30.     I reviewed images myself. In my review, I think there is a 2cm spinal cord lesion (right side, C6-C7) and a smaller left T1 lesion, suspicious for demyelinating disease, on the MRI cervical spine from 10/08/03. No adjacent disc or bone degeneration or spinal stenosis. The MRI report states that the cord appears normal. -VRP  10/08/03 MRI cervical spine (images reviewed) - Satisfactory anterior plate fusion of C4 and C5.Disk degeneration and spondylosis at C3-4 and C5-6 with biforaminal narrowing but no significant central canal stenosis. There is no disk protrusion.   11/06/11 MRI brain (report only) - "subtle area of increased T2 intensity in the right posterior upper medulla likely representing old infarct or possibly area of previous inflammation. Otherwise normal MRI of the brain."  12/04/13 MRI brain (with and without) demonstrating: 1. Few punctate foci of right frontal subcortical and periventricular non-specific gliosis. These findings are non-specific and considerations include autoimmune, inflammatory, post-infectious, microvascular ischemic or migraine associated etiologies.  2. No abnormal enhancing lesions. No acute findings.  12/04/13 MRI cervical spine (with and without) demonstrating: 1. The spinal cord is notable for 3 subtle T2 hyperintense lesions: C5-6 on the left, C7 on the right, T2 slightly on the right of midline. No abnormal enhancing lesions. Considerations include autoimmune, inflammatory or post-infectious etiologies. No adjacent disc disease to suggest mechanical  compressive etiologies.  2. At C6-7: left uncovertebral joint hypertrophy with moderate left foraminal stenosis. 3. Compared to prior MRI on 10/08/03, the C5-6 lesion appears new; the C7 and T2 spinal cord lesions appear stable. Also, there has been change in ACDF levels (previously from C4-C5; now C3-C6).   12/04/13 MRI thoracic spine (with and without) - unremarkable  12/29/13 LP - WBC 1, RBC 0, protein 26, glucose 59, OCB > 5, gram stain neg  09/28/14 MRI brain [I reviewed images myself and agree with interpretation. -VRP]  1. There is a small T2/FLAIR hyperintense focus in the right frontal lobe. This is nonspecific and could be idiopathic or represent a small focus of chronic microvascular ischemic change or demyelination.. The extent is within  a normal range for her age. 2. There are no enhancing lesions or acute findings 3. Compared to the MRI dated 12/04/2013, there is no definite interval change.  04/11/15 MRI brain (with and without) demonstrating: 1. Subtle right and left frontal subcortical foci of non-specific gliosis. No abnormal lesions are seen on post contrast views. These findings are non-specific and considerations include autoimmune, inflammatory, post-infectious, microvascular ischemic or migraine associated etiologies.  2. No acute findings. 3. No change from MRI on 09/28/14.   Anti-JCV antibody 01/10/14 - 0.10 Negative  05/22/14 - 0.09 Negative 12/2014 - negative March 2017 - negative     ASSESSMENT AND PLAN  70 y.o. year old female here with bilateral lower extremity spasticity, weakness,with brisk reflexes in upper and lower extremities, left Hoffman sign, bilateral upgoing toes. MRIs from 2005 and 2015 demonstrate multiple spinal cord lesions. Now suspect demyelinating disease, with first attack in 2002, second attack in 2003, and 3rd attack in 2014.   Dx: multiple sclerosis   Multiple sclerosis ([place])  Weakness of both lower extremities    PLAN: I  spent 25 minutes of face to face time with patient. Greater than 50% of time was spent in counseling and coordination of care with patient. In summary we discussed: - continue tysabri - check MRI brain and JCV ab q6 months (Next in Sept 2017) - continue ampyra (walking speed and balance have improved; T25W: 46s --> 25s after ampyra)  Return in about 3 months (around 10/04/2015).    Penni Bombard, MD [id number], [id number] PM Certified in Neurology, Neurophysiology and Neuroimaging  [facility] [address], [place], [place] [address] [phone]

## 2015-07-17 ENCOUNTER — Other Ambulatory Visit: Payer: Self-pay | Admitting: Family Medicine

## 2015-07-17 DIAGNOSIS — Z1231 Encounter for screening mammogram for malignant neoplasm of breast: Secondary | ICD-10-CM

## 2015-07-30 DIAGNOSIS — G35 Multiple sclerosis: Secondary | ICD-10-CM | POA: Diagnosis not present

## 2015-08-14 DIAGNOSIS — G894 Chronic pain syndrome: Secondary | ICD-10-CM | POA: Diagnosis not present

## 2015-08-14 DIAGNOSIS — M5137 Other intervertebral disc degeneration, lumbosacral region: Secondary | ICD-10-CM | POA: Diagnosis not present

## 2015-08-14 DIAGNOSIS — M47817 Spondylosis without myelopathy or radiculopathy, lumbosacral region: Secondary | ICD-10-CM | POA: Diagnosis not present

## 2015-08-14 DIAGNOSIS — Z79899 Other long term (current) drug therapy: Secondary | ICD-10-CM | POA: Diagnosis not present

## 2015-08-14 DIAGNOSIS — Z79891 Long term (current) use of opiate analgesic: Secondary | ICD-10-CM | POA: Diagnosis not present

## 2015-08-14 DIAGNOSIS — M4696 Unspecified inflammatory spondylopathy, lumbar region: Secondary | ICD-10-CM | POA: Diagnosis not present

## 2015-08-15 ENCOUNTER — Ambulatory Visit
Admission: RE | Admit: 2015-08-15 | Discharge: 2015-08-15 | Disposition: A | Discharge status: Home / Self Care | Payer: Medicare Other | Source: Ambulatory Visit | Attending: Family Medicine | Admitting: Family Medicine

## 2015-08-15 ENCOUNTER — Ambulatory Visit: Payer: Medicare Other

## 2015-08-15 DIAGNOSIS — Z1231 Encounter for screening mammogram for malignant neoplasm of breast: Secondary | ICD-10-CM

## 2015-08-27 DIAGNOSIS — G35 Multiple sclerosis: Secondary | ICD-10-CM | POA: Diagnosis not present

## 2015-09-18 ENCOUNTER — Encounter: Payer: Self-pay | Admitting: *Deleted

## 2015-09-18 ENCOUNTER — Other Ambulatory Visit: Payer: Self-pay | Admitting: Diagnostic Neuroimaging

## 2015-09-20 ENCOUNTER — Telehealth: Payer: Self-pay | Admitting: *Deleted

## 2015-09-20 NOTE — Telephone Encounter (Signed)
[employer] patient authorization valid from 09/18/2015 through 04/10/2016. Pt enrollment # [id number]. SIte auth # [id number]

## 2015-09-21 DIAGNOSIS — Z23 Encounter for immunization: Secondary | ICD-10-CM | POA: Diagnosis not present

## 2015-09-26 DIAGNOSIS — G35 Multiple sclerosis: Secondary | ICD-10-CM | POA: Diagnosis not present

## 2015-10-08 ENCOUNTER — Encounter: Payer: Self-pay | Admitting: Diagnostic Neuroimaging

## 2015-10-08 ENCOUNTER — Ambulatory Visit (INDEPENDENT_AMBULATORY_CARE_PROVIDER_SITE_OTHER): Payer: Medicare Other | Admitting: Diagnostic Neuroimaging

## 2015-10-08 VITALS — BP 109/74 | HR 100 | Wt 112.4 lb

## 2015-10-08 DIAGNOSIS — E559 Vitamin D deficiency, unspecified: Secondary | ICD-10-CM

## 2015-10-08 DIAGNOSIS — G35 Multiple sclerosis: Secondary | ICD-10-CM

## 2015-10-08 DIAGNOSIS — R29898 Other symptoms and signs involving the musculoskeletal system: Secondary | ICD-10-CM

## 2015-10-08 DIAGNOSIS — R258 Other abnormal involuntary movements: Secondary | ICD-10-CM

## 2015-10-08 DIAGNOSIS — R252 Cramp and spasm: Secondary | ICD-10-CM

## 2015-10-08 MED ORDER — DALFAMPRIDINE ER 10 MG PO TB12: 10.0000 mg | ORAL_TABLET | Freq: Two times a day (BID) | ORAL | 6 refills | Status: DC

## 2015-10-08 MED ORDER — BACLOFEN 10 MG PO TABS: 10.0000 mg | ORAL_TABLET | Freq: Three times a day (TID) | ORAL | 12 refills | Status: DC

## 2015-10-08 NOTE — Progress Notes (Signed)
[facility]  PATIENT: Amy Bray DOB: 28-Dec-1945  REFERRING CLINICIAN:  HISTORY FROM: patient and husband  REASON FOR VISIT: follow up   HISTORICAL  CHIEF COMPLAINT:  Chief Complaint  Patient presents with  . Multiple Sclerosis    rm 7, Tysabri, 03/2015 MRI brain, JCV-neg, husband- Clair Gulling, "I htink my walking and balance are getting worse. Difficult time sleeping. Ringing in my ears."  . Follow-up    3 month    HISTORY OF PRESENT ILLNESS:   UPDATE 10/08/15: Since last visit, pt feels some progression of gait diff and walking problems. No specific attack or flare up.  UPDATE 07/04/15: Since last visit, doing well. Tolerating tysabri and ampyra.  UPDATE 04/02/15: Since last visit, doing well. Tolerating tysabri and ampyra. Back on nortriptyline (is helping burning pain in feet).   UPDATE 01/03/15: Since last visit, doing better on ampyra (better gait and walking speed). More foot/calf tingling burning pain x 1-2 months. Now with Dr. Andree Elk pain mgmt for back injections x 2, which is helping.  UPDATE 10/02/14: Since last visit, doing well. Requesting transition of care to [place] for pain mgmt; previously at [facility] with Dr. Oliva Bustard.  UPDATE 05/22/14: Since last visit, doing well. On tysabri since March 2016. Fatigue and bladder control much improved. Gait is stable.   UPDATE 02/20/14: Since last visit, had reviewed lab results and now ready to start MS medication. Now would like to start tysabri.  UPDATE 01/09/14: Since last visit, no new events symptoms. LP results reviewed. Didn't get lab work at last visit for MS mimics.   UPDATE 12/07/13: Patient is stable with symptoms. MRI scans reviewed. Continues to have weakness in legs and balance difficulty.  UPDATE 11/21/13: 70 year old right-handed female here for evaluation of lower extremity spasms and weakness. 2002 patient developed onset of right leg stiffness, spasm, weakness. Patient was diagnosed with cervical  spine disease, bone spur ridging, and treated with anterior cervical discectomy and fusion in 2003. Following this her right leg weakness and spasms significantly improved. However one year later her symptoms return. Patient followed up with several surgeons and ultimately had a second cervical spine surgery in 2006. Following this patient had slight improvement of right leg however within several months her weakness, spasms and gait continued to decline. Patient transitioned needing a cane to walk. 2013 patient was evaluated by a neurologist in [place], to evaluate her right leg weakness. Apparently patient had a "multiple sclerosis" workup including MRI of the brain and extensive blood testing. Patient also had EMG nerve conduction studies looking for peroneal neuropathy but apparently all studies were normal. MRI of the brain did show a right medulla lesion which was reported as a stroke. Over the past 1 year patient has developed new onset left leg weakness, spasm, stiffness. Patient followed up with orthopedic surgery who then referred patient to me for evaluation. Last MRI of cervical spine was in 2006 at [facility]. No bowel or bladder incontinence. No significant numbness or tingling. No weakness in upper extremities. No vision loss, vision changes, slurred speech or trouble talking.   REVIEW OF SYSTEMS: Full 14 system review of systems performed and negative except for ringing in ears restless legs walking diff back pain.    ALLERGIES: No Known Allergies  HOME MEDICATIONS: Outpatient Medications Prior to Visit  Medication Sig Dispense Refill  . alendronate (FOSAMAX) 70 MG tablet Take 1 tablet by mouth once a week.    . baclofen (LIORESAL) 10 MG tablet TAKE  ONE TABLET BY MOUTH 4 TIMES DAILY AS NEEDED FOR MUSCLE SPASM 120 tablet 6  . Cranberry-Vitamin C-Vitamin E (CRANBERRY PLUS VITAMIN C) 4200-20-3 MG-MG-UNIT CAPS Take by mouth.    . dalfampridine (AMPYRA) 10 MG TB12 Take 1 tablet (10 mg  total) by mouth 2 (two) times daily. 60 tablet 6  . lidocaine (XYLOCAINE) 5 % ointment Apply 1 application topically as needed. 03/13/15 prescription faxed to [facility]    . lisinopril (PRINIVIL,ZESTRIL) 10 MG tablet Take 1 tablet by mouth daily.    Marland Kitchen MYRBETRIQ 50 MG TB24 tablet 50 mg.    . natalizumab (TYSABRI) 300 MG/15ML injection Inject into the vein.    Marland Kitchen nortriptyline (PAMELOR) 50 MG capsule Take 1 capsule by mouth daily.    . traMADol-acetaminophen (ULTRACET) 37.5-325 MG per tablet Take 1 tablet by mouth daily as needed.    Marland Kitchen acetaminophen (TYLENOL) 325 MG tablet Take by mouth.     No facility-administered medications prior to visit.     PAST MEDICAL HISTORY: Past Medical History:  Diagnosis Date  . Cervical myelopathy ([place])   . Multiple sclerosis ([place])     PAST SURGICAL HISTORY: Past Surgical History:  Procedure Laterality Date  . CERVICAL FUSION  01/2001, 01/2004   C3-C6    FAMILY HISTORY: Family History  Problem Relation Age of Onset  . Transient ischemic attack Mother   . Pancreatic cancer Father     SOCIAL HISTORY:  Social History   Social History  . Marital status: Married    Spouse name: Mayari Biese  . Number of children: 0  . Years of education: College   Occupational History  .  Other    n/a   Social History Main Topics  . Smoking status: Former Smoker    Packs/day: 1.00    Years: 15.00    Types: Cigarettes    Quit date: 01/21/1983  . Smokeless tobacco: Never Used  . Alcohol use 0.0 oz/week     Comment: occasionally- wine  . Drug use: No  . Sexual activity: Not on file   Other Topics Concern  . Not on file   Social History Narrative   Patient lives at home with her spouse.   Caffeine Use-none     PHYSICAL EXAM  Vitals:   10/08/15 1350  BP: 109/74  Pulse: 100  Weight: 112 lb 6.4 oz (51 kg)    Not recorded     No exam data present   Body mass index is 21.24 kg/m.  GENERAL EXAM: Patient is in no distress; well  developed, nourished and groomed; neck is supple  CARDIOVASCULAR: Regular rate and rhythm, no murmurs, no carotid bruits  NEUROLOGIC: MENTAL STATUS: awake, alert, language fluent, comprehension intact, naming intact, fund of knowledge appropriate CRANIAL NERVE: pupils equal and reactive to light, visual fields full to confrontation, extraocular muscles intact, no nystagmus, facial sensation and strength symmetric, hearing intact, palate elevates symmetrically, uvula midline, shoulder shrug symmetric, tongue midline. MOTOR: normal bulk and tone in BUE; MILD INCREASED EXT TONE IN BLE; BLE (HF 3-4, KE 5 KF 4 DF 4) SENSORY: DECR VIB AT TOES  COORDINATION: finger-nose-finger, fine finger movements --> MINIMAL ACTION TREMOR IN BUE REFLEXES: BUE 3; BLE (KNEES 3, ANKLES 2) GAIT/STATION: SPASTIC, PARAPARETIC GAIT; UNSTEADY; USES ROLLATOR WALKER      DIAGNOSTIC DATA (LABS, IMAGING, TESTING) - I reviewed patient records, labs, notes, testing and imaging myself where available.  Lab Results  Component Value Date   WBC 6.7 04/02/2015  HCT 37.5 04/02/2015   MCV 94 04/02/2015   PLT 299 04/02/2015      Component Value Date/Time   NA 144 04/02/2015 1438   K 3.8 04/02/2015 1438   CL 101 04/02/2015 1438   CO2 27 04/02/2015 1438   GLUCOSE 122 (H) 04/02/2015 1438   BUN 14 04/02/2015 1438   CREATININE 0.69 04/02/2015 1438   CALCIUM 9.7 04/02/2015 1438   PROT 6.2 04/02/2015 1438   ALBUMIN 4.2 04/02/2015 1438   AST 16 04/02/2015 1438   ALT 18 04/02/2015 1438   ALKPHOS 36 (L) 04/02/2015 1438   BILITOT <0.2 04/02/2015 1438   GFRNONAA 90 04/02/2015 1438   GFRAA 103 04/02/2015 1438   No results found for: CHOL, HDL, LDLCALC, LDLDIRECT, TRIG, CHOLHDL No results found for: HGBA1C No results found for: VITAMINB12 No results found for: TSH  Vit D, 25-Hydroxy  Date Value Ref Range Status  01/03/2015 36.8 30.0 - 100.0 ng/mL Final    Comment:    Vitamin D deficiency has been defined by the  Institute of Medicine and an Endocrine Society practice guideline as a level of serum 25-OH vitamin D less than 20 ng/mL (1,2). The Endocrine Society went on to further define vitamin D insufficiency as a level between 21 and 29 ng/mL (2). 1. IOM (Institute of Medicine). 2010. Dietary reference    intakes for calcium and D. [place]: The    [employer]. 2. Holick MF, Binkley [place], Bischoff-Ferrari HA, et al.    Evaluation, treatment, and prevention of vitamin D    deficiency: an Endocrine Society clinical practice    guideline. JCEM. 2011 Jul; 96(7):1911-30.     I reviewed images myself. In my review, I think there is a 2cm spinal cord lesion (right side, C6-C7) and a smaller left T1 lesion, suspicious for demyelinating disease, on the MRI cervical spine from 10/08/03. No adjacent disc or bone degeneration or spinal stenosis. The MRI report states that the cord appears normal. -VRP  10/08/03 MRI cervical spine (images reviewed) - Satisfactory anterior plate fusion of C4 and C5.Disk degeneration and spondylosis at C3-4 and C5-6 with biforaminal narrowing but no significant central canal stenosis. There is no disk protrusion.   11/06/11 MRI brain (report only) - "subtle area of increased T2 intensity in the right posterior upper medulla likely representing old infarct or possibly area of previous inflammation. Otherwise normal MRI of the brain."  12/04/13 MRI brain (with and without) demonstrating: 1. Few punctate foci of right frontal subcortical and periventricular non-specific gliosis. These findings are non-specific and considerations include autoimmune, inflammatory, post-infectious, microvascular ischemic or migraine associated etiologies.  2. No abnormal enhancing lesions. No acute findings.  12/04/13 MRI cervical spine (with and without) demonstrating: 1. The spinal cord is notable for 3 subtle T2 hyperintense lesions: C5-6 on the left, C7 on the right, T2 slightly on  the right of midline. No abnormal enhancing lesions. Considerations include autoimmune, inflammatory or post-infectious etiologies. No adjacent disc disease to suggest mechanical compressive etiologies.  2. At C6-7: left uncovertebral joint hypertrophy with moderate left foraminal stenosis. 3. Compared to prior MRI on 10/08/03, the C5-6 lesion appears new; the C7 and T2 spinal cord lesions appear stable. Also, there has been change in ACDF levels (previously from C4-C5; now C3-C6).   12/04/13 MRI thoracic spine (with and without) - unremarkable  12/29/13 LP - WBC 1, RBC 0, protein 26, glucose 59, OCB > 5, gram stain neg  09/28/14 MRI brain [I reviewed images  myself and agree with interpretation. -VRP]  1. There is a small T2/FLAIR hyperintense focus in the right frontal lobe. This is nonspecific and could be idiopathic or represent a small focus of chronic microvascular ischemic change or demyelination.. The extent is within a normal range for her age. 2. There are no enhancing lesions or acute findings 3. Compared to the MRI dated 12/04/2013, there is no definite interval change.  04/11/15 MRI brain (with and without) demonstrating: 1. Subtle right and left frontal subcortical foci of non-specific gliosis. No abnormal lesions are seen on post contrast views. These findings are non-specific and considerations include autoimmune, inflammatory, post-infectious, microvascular ischemic or migraine associated etiologies.  2. No acute findings. 3. No change from MRI on 09/28/14.   Anti-JCV antibody 01/10/14 - 0.10 Negative  05/22/14 - 0.09 Negative 12/2014 - negative March 2017 - negative     ASSESSMENT AND PLAN  70 y.o. year old female here with bilateral lower extremity spasticity, weakness,with brisk reflexes in upper and lower extremities, left Hoffman sign, bilateral upgoing toes. MRIs from 2005 and 2015 demonstrate multiple spinal cord lesions. Now suspect demyelinating disease, with  first attack in 2002, second attack in 2003, and 3rd attack in 2014.  Now some progression of symptoms since mid-2017.   Dx: multiple sclerosis   Multiple sclerosis ([place])  Weakness of both lower extremities  Spasticity  Vitamin D deficiency    PLAN: I spent 25 minutes of face to face time with patient. Greater than 50% of time was spent in counseling and coordination of care with patient. In summary we discussed: - check MRI brain and JCV ab and labs - continue tysabri; may consider switch to lemtrada if progression on MRI has occurred - continue ampyra (walking speed and balance have improved; T25W: 46s --> 25s after ampyra) - use baclofen for spasticity  Orders Placed This Encounter  Procedures  . MR Brain W Wo Contrast  . MR Cervical Spine W Wo Contrast  . MR Thoracic Spine W Wo Contrast  . CBC with Differential/Platelet  . CMP  . Stratify JCV Antibody Test ([facility])   Meds ordered this encounter  Medications  . baclofen (LIORESAL) 10 MG tablet    Sig: Take 1-2 tablets (10-20 mg total) by mouth 3 (three) times daily.    Dispense:  180 tablet    Refill:  12  . dalfampridine (AMPYRA) 10 MG TB12    Sig: Take 1 tablet (10 mg total) by mouth 2 (two) times daily.    Dispense:  60 tablet    Refill:  6   Return in about 4 months (around 02/07/2016).    Penni Bombard, MD [id number], [id number] PM Certified in Neurology, Neurophysiology and Neuroimaging  [facility] [address] [place], [place] [address] [phone]

## 2015-10-08 NOTE — Patient Instructions (Signed)
-  continue current medications

## 2015-10-09 LAB — COMPREHENSIVE METABOLIC PANEL
ALT: 18 IU/L (ref 0–32)
AST: 17 IU/L (ref 0–40)
Albumin/Globulin Ratio: 2.4 — ABNORMAL HIGH (ref 1.2–2.2)
Albumin: 4.5 g/dL (ref 3.6–4.8)
Alkaline Phosphatase: 48 IU/L (ref 39–117)
BUN/Creatinine Ratio: 23 (ref 12–28)
BUN: 14 mg/dL (ref 8–27)
Bilirubin Total: 0.3 mg/dL (ref 0.0–1.2)
CO2: 26 mmol/L (ref 18–29)
Calcium: 10.5 mg/dL — ABNORMAL HIGH (ref 8.7–10.3)
Chloride: 104 mmol/L (ref 96–106)
Creatinine, Ser: 0.6 mg/dL (ref 0.57–1.00)
GFR calc Af Amer: 108 mL/min/{1.73_m2} (ref >59)
GFR calc non Af Amer: 94 mL/min/{1.73_m2} (ref >59)
Globulin, Total: 1.9 g/dL (ref 1.5–4.5)
Glucose: 95 mg/dL (ref 65–99)
Potassium: 4.8 mmol/L (ref 3.5–5.2)
Sodium: 146 mmol/L — ABNORMAL HIGH (ref 134–144)
Total Protein: 6.4 g/dL (ref 6.0–8.5)

## 2015-10-09 LAB — CBC WITH DIFFERENTIAL/PLATELET
Basophils Absolute: 0 10*3/uL (ref 0.0–0.2)
Basos: 0 %
EOS (ABSOLUTE): 0.1 10*3/uL (ref 0.0–0.4)
Eos: 1 %
Hematocrit: 36.8 % (ref 34.0–46.6)
Hemoglobin: 12.3 g/dL (ref 11.1–15.9)
Immature Grans (Abs): 0 10*3/uL (ref 0.0–0.1)
Immature Granulocytes: 0 %
Lymphocytes Absolute: 2 10*3/uL (ref 0.7–3.1)
Lymphs: 21 %
MCH: 31.9 pg (ref 26.6–33.0)
MCHC: 33.4 g/dL (ref 31.5–35.7)
MCV: 96 fL (ref 79–97)
Monocytes Absolute: 1 10*3/uL — ABNORMAL HIGH (ref 0.1–0.9)
Monocytes: 11 %
Neutrophils Absolute: 6.1 10*3/uL (ref 1.4–7.0)
Neutrophils: 67 %
Platelets: 256 10*3/uL (ref 150–379)
RBC: 3.85 x10E6/uL (ref 3.77–5.28)
RDW: 14.5 % (ref 12.3–15.4)
WBC: 9.2 10*3/uL (ref 3.4–10.8)

## 2015-10-15 ENCOUNTER — Telehealth: Payer: Self-pay | Admitting: *Deleted

## 2015-10-15 NOTE — Telephone Encounter (Signed)
Per Dr Leta Baptist, spoke with patient and informed her that her lab results are unremarkable, JCV is negative. She verbalized understanding, appreciation for call.

## 2015-10-19 ENCOUNTER — Ambulatory Visit
Admission: RE | Admit: 2015-10-19 | Discharge: 2015-10-19 | Disposition: A | Discharge status: Home / Self Care | Payer: Medicare Other | Source: Ambulatory Visit | Attending: Diagnostic Neuroimaging | Admitting: Diagnostic Neuroimaging

## 2015-10-19 DIAGNOSIS — R252 Cramp and spasm: Secondary | ICD-10-CM

## 2015-10-19 DIAGNOSIS — R29898 Other symptoms and signs involving the musculoskeletal system: Secondary | ICD-10-CM

## 2015-10-19 DIAGNOSIS — G35 Multiple sclerosis: Secondary | ICD-10-CM

## 2015-10-19 DIAGNOSIS — E559 Vitamin D deficiency, unspecified: Secondary | ICD-10-CM

## 2015-10-19 DIAGNOSIS — M50223 Other cervical disc displacement at C6-C7 level: Secondary | ICD-10-CM | POA: Diagnosis not present

## 2015-10-19 MED ORDER — GADOBENATE DIMEGLUMINE 529 MG/ML IV SOLN
10.0000 mL | Freq: Once | INTRAVENOUS | Status: AC | PRN
  Administered 2015-10-19: 10 mL via INTRAVENOUS

## 2015-10-22 ENCOUNTER — Telehealth: Payer: Self-pay | Admitting: *Deleted

## 2015-10-22 DIAGNOSIS — G35 Multiple sclerosis: Secondary | ICD-10-CM | POA: Diagnosis not present

## 2015-10-22 NOTE — Telephone Encounter (Signed)
Per Dr Leta Baptist, spoke with patient and informed her that her MRI cervical spine I stable. Advised Dr Leta Baptist will continue with her current treatment plan. She verbalized understanding, appreciation.

## 2015-10-29 ENCOUNTER — Ambulatory Visit
Admission: RE | Admit: 2015-10-29 | Discharge: 2015-10-29 | Disposition: A | Discharge status: Home / Self Care | Payer: Medicare Other | Source: Ambulatory Visit | Attending: Diagnostic Neuroimaging | Admitting: Diagnostic Neuroimaging

## 2015-10-29 DIAGNOSIS — R29898 Other symptoms and signs involving the musculoskeletal system: Secondary | ICD-10-CM

## 2015-10-29 DIAGNOSIS — G35 Multiple sclerosis: Secondary | ICD-10-CM

## 2015-10-29 DIAGNOSIS — R252 Cramp and spasm: Secondary | ICD-10-CM

## 2015-10-29 DIAGNOSIS — E559 Vitamin D deficiency, unspecified: Secondary | ICD-10-CM

## 2015-10-29 MED ORDER — GADOBENATE DIMEGLUMINE 529 MG/ML IV SOLN
10.0000 mL | Freq: Once | INTRAVENOUS | Status: AC | PRN
  Administered 2015-10-29: 10 mL via INTRAVENOUS

## 2015-11-07 DIAGNOSIS — M4696 Unspecified inflammatory spondylopathy, lumbar region: Secondary | ICD-10-CM | POA: Diagnosis not present

## 2015-11-07 DIAGNOSIS — M533 Sacrococcygeal disorders, not elsewhere classified: Secondary | ICD-10-CM | POA: Diagnosis not present

## 2015-11-07 DIAGNOSIS — M5137 Other intervertebral disc degeneration, lumbosacral region: Secondary | ICD-10-CM | POA: Diagnosis not present

## 2015-11-07 DIAGNOSIS — G894 Chronic pain syndrome: Secondary | ICD-10-CM | POA: Diagnosis not present

## 2015-11-07 DIAGNOSIS — Z79899 Other long term (current) drug therapy: Secondary | ICD-10-CM | POA: Diagnosis not present

## 2015-11-07 DIAGNOSIS — Z79891 Long term (current) use of opiate analgesic: Secondary | ICD-10-CM | POA: Diagnosis not present

## 2015-11-07 DIAGNOSIS — M47817 Spondylosis without myelopathy or radiculopathy, lumbosacral region: Secondary | ICD-10-CM | POA: Diagnosis not present

## 2015-11-21 DIAGNOSIS — G35 Multiple sclerosis: Secondary | ICD-10-CM | POA: Diagnosis not present

## 2015-11-27 ENCOUNTER — Telehealth: Payer: Self-pay | Admitting: Diagnostic Neuroimaging

## 2015-11-27 NOTE — Telephone Encounter (Signed)
Pt called in about dalfampridine (AMPYRA) 10 MG TB12. She says it has gone to generic and wants to know if she can switch or what should be done. Please call and advise [phone] after 3p today

## 2015-11-27 NOTE — Telephone Encounter (Signed)
Spoke with patient and informed her that per Dr Leta Baptist, she may take generic Ampyra. She stated she needed a prescription. Advised that Dr Leta Baptist refilled this medication in Sept with additional refills. She stated she has enough to last until the end of Dec. She will check on cost difference at that time. She verbalized understanding, appreciation for call.

## 2015-12-03 NOTE — Telephone Encounter (Addendum)
Attempted to reach patient; phone rang x 4 then went dead.  Reached patient who stated she has been receiving medication from [facility] with assistance; she never had to pay for it. She called the compnay and was informed that the  assitance program will stop on 12/31/ 17, and the cost will then be $800/month.  She stated the person told her she would be mailed information re: other discount programs. Patient stated she has approximately 2 weeks of medication remaining. She asked if she cannot afford medication any longer, does she need to taper off. She then stated she would call back at the end of this week to let this RN know about additional information she received for assistance. This RN advised will discuss possible taper with Dr Leta Baptist. She stated she will get that information when she calls back. She verbalized understanding, appreciation of call.

## 2015-12-03 NOTE — Telephone Encounter (Signed)
Pt called to advise she called [facility] and was told they have not rec'd RX for generic ampyra. She sts she will need the generic starting in January and she would like for RX to be on file at the pharmacy.

## 2015-12-03 NOTE — Telephone Encounter (Signed)
Take 1 tab daily until finished then stop. -VRP

## 2015-12-03 NOTE — Telephone Encounter (Signed)
Patient returned Mary Clare's call. °

## 2015-12-03 NOTE — Telephone Encounter (Signed)
[place] and spoke with Gildardo Cranker, pharmacist who stated their pharmacy has never filled Ampyra for patient due to it being a specialty medication requiring certification. She advised calling the patient for clarification.

## 2015-12-03 NOTE — Telephone Encounter (Signed)
LVM for patient requesting she call back re: prescription for Ampyra.

## 2015-12-04 NOTE — Telephone Encounter (Signed)
LVM informing patient, per Dr Leta Baptist if she needs to go off Ampyra she should take one tablet daily until all tabs are gone. Advised she call for any questions, further needs. Left number.

## 2015-12-18 ENCOUNTER — Other Ambulatory Visit: Payer: Self-pay | Admitting: Diagnostic Neuroimaging

## 2015-12-18 NOTE — Telephone Encounter (Signed)
Spoke with patient who stated she received a call from [facility] stating that she could receive one more refill if Dr Leta Baptist authorizes.  They will fax over refill request to this office. Patient stated that Ampyra rep could not tell her when exactly generic form will be available or where patient could purchase it. This RN advised patient she will look for fax and have Dr Leta Baptist complete. Patient stated she has enough tablets for one more week. She verbalized understanding, appreciation of call.

## 2015-12-18 NOTE — Telephone Encounter (Signed)
Pt called said she has 1 refill left of ampyra, FYI. She is requesting a call, she has some information reg medication she would like to share with RN

## 2015-12-19 DIAGNOSIS — M899 Disorder of bone, unspecified: Secondary | ICD-10-CM | POA: Diagnosis not present

## 2015-12-19 DIAGNOSIS — R8290 Unspecified abnormal findings in urine: Secondary | ICD-10-CM | POA: Diagnosis not present

## 2015-12-19 DIAGNOSIS — G35 Multiple sclerosis: Secondary | ICD-10-CM | POA: Diagnosis not present

## 2015-12-19 DIAGNOSIS — R7301 Impaired fasting glucose: Secondary | ICD-10-CM | POA: Diagnosis not present

## 2015-12-19 DIAGNOSIS — H6120 Impacted cerumen, unspecified ear: Secondary | ICD-10-CM | POA: Diagnosis not present

## 2015-12-19 DIAGNOSIS — M810 Age-related osteoporosis without current pathological fracture: Secondary | ICD-10-CM | POA: Diagnosis not present

## 2015-12-19 DIAGNOSIS — I10 Essential (primary) hypertension: Secondary | ICD-10-CM | POA: Diagnosis not present

## 2015-12-19 DIAGNOSIS — Z0000 Encounter for general adult medical examination without abnormal findings: Secondary | ICD-10-CM | POA: Diagnosis not present

## 2015-12-20 DIAGNOSIS — G35 Multiple sclerosis: Secondary | ICD-10-CM | POA: Diagnosis not present

## 2015-12-21 ENCOUNTER — Other Ambulatory Visit: Payer: Self-pay | Admitting: Family Medicine

## 2015-12-21 DIAGNOSIS — M810 Age-related osteoporosis without current pathological fracture: Secondary | ICD-10-CM

## 2015-12-28 ENCOUNTER — Other Ambulatory Visit: Payer: Medicare Other

## 2016-01-04 ENCOUNTER — Ambulatory Visit
Admission: RE | Admit: 2016-01-04 | Discharge: 2016-01-04 | Disposition: A | Discharge status: Home / Self Care | Payer: Medicare Other | Source: Ambulatory Visit | Attending: Family Medicine | Admitting: Family Medicine

## 2016-01-04 DIAGNOSIS — M810 Age-related osteoporosis without current pathological fracture: Secondary | ICD-10-CM

## 2016-01-04 DIAGNOSIS — Z780 Asymptomatic menopausal state: Secondary | ICD-10-CM | POA: Diagnosis not present

## 2016-01-16 DIAGNOSIS — G35 Multiple sclerosis: Secondary | ICD-10-CM | POA: Diagnosis not present

## 2016-01-21 DIAGNOSIS — M810 Age-related osteoporosis without current pathological fracture: Secondary | ICD-10-CM

## 2016-01-21 HISTORY — DX: Age-related osteoporosis without current pathological fracture: M81.0

## 2016-01-30 DIAGNOSIS — M47817 Spondylosis without myelopathy or radiculopathy, lumbosacral region: Secondary | ICD-10-CM | POA: Diagnosis not present

## 2016-01-30 DIAGNOSIS — M4696 Unspecified inflammatory spondylopathy, lumbar region: Secondary | ICD-10-CM | POA: Diagnosis not present

## 2016-01-30 DIAGNOSIS — Z79891 Long term (current) use of opiate analgesic: Secondary | ICD-10-CM | POA: Diagnosis not present

## 2016-01-30 DIAGNOSIS — Z79899 Other long term (current) drug therapy: Secondary | ICD-10-CM | POA: Diagnosis not present

## 2016-01-30 DIAGNOSIS — M5137 Other intervertebral disc degeneration, lumbosacral region: Secondary | ICD-10-CM | POA: Diagnosis not present

## 2016-01-30 DIAGNOSIS — G894 Chronic pain syndrome: Secondary | ICD-10-CM | POA: Diagnosis not present

## 2016-02-04 DIAGNOSIS — M810 Age-related osteoporosis without current pathological fracture: Secondary | ICD-10-CM | POA: Diagnosis not present

## 2016-02-11 ENCOUNTER — Encounter: Payer: Self-pay | Admitting: Diagnostic Neuroimaging

## 2016-02-11 ENCOUNTER — Ambulatory Visit (INDEPENDENT_AMBULATORY_CARE_PROVIDER_SITE_OTHER): Payer: Medicare Other | Admitting: Diagnostic Neuroimaging

## 2016-02-11 VITALS — BP 133/84 | HR 103 | Wt 114.4 lb

## 2016-02-11 DIAGNOSIS — R252 Cramp and spasm: Secondary | ICD-10-CM

## 2016-02-11 DIAGNOSIS — G35 Multiple sclerosis: Secondary | ICD-10-CM | POA: Diagnosis not present

## 2016-02-11 DIAGNOSIS — E559 Vitamin D deficiency, unspecified: Secondary | ICD-10-CM

## 2016-02-11 DIAGNOSIS — R29898 Other symptoms and signs involving the musculoskeletal system: Secondary | ICD-10-CM

## 2016-02-11 MED ORDER — BACLOFEN 10 MG PO TABS: 20.0000 mg | ORAL_TABLET | Freq: Three times a day (TID) | ORAL | 12 refills | Status: DC

## 2016-02-11 MED ORDER — DALFAMPRIDINE ER 10 MG PO TB12: 10.0000 mg | ORAL_TABLET | Freq: Two times a day (BID) | ORAL | 4 refills | Status: DC

## 2016-02-11 NOTE — Progress Notes (Signed)
[facility]  PATIENT: Amy Bray DOB: [birth year]  REFERRING CLINICIAN:  HISTORY FROM: patient and husband  REASON FOR VISIT: follow up   HISTORICAL  CHIEF COMPLAINT:  Chief Complaint  Patient presents with  . Multiple Sclerosis    rm 6, husband- Lanetta Inch, "walking and balance getting worse"  . Follow-up    4 month    HISTORY OF PRESENT ILLNESS:   UPDATE 02/11/16: Since last visit, balance diff continues. No flares. MRIs stable. Tolerating tysabri.   UPDATE 10/08/15: Since last visit, pt feels some progression of gait diff and walking problems. No specific attack or flare up.  UPDATE 07/04/15: Since last visit, doing well. Tolerating tysabri and ampyra.  UPDATE 04/02/15: Since last visit, doing well. Tolerating tysabri and ampyra. Back on nortriptyline (is helping burning pain in feet).   UPDATE 01/03/15: Since last visit, doing better on ampyra (better gait and walking speed). More foot/calf tingling burning pain x 1-2 months. Now with Dr. Andree Elk pain mgmt for back injections x 2, which is helping.  UPDATE 10/02/14: Since last visit, doing well. Requesting transition of care to [place] for pain mgmt; previously at [facility] with Dr. Oliva Bustard.  UPDATE 05/22/14: Since last visit, doing well. On tysabri since March 2016. Fatigue and bladder control much improved. Gait is stable.   UPDATE 02/20/14: Since last visit, had reviewed lab results and now ready to start MS medication. Now would like to start tysabri.  UPDATE 01/09/14: Since last visit, no new events symptoms. LP results reviewed. Didn't get lab work at last visit for MS mimics.   UPDATE 12/07/13: Patient is stable with symptoms. MRI scans reviewed. Continues to have weakness in legs and balance difficulty.  UPDATE 11/21/13: 71 year old right-handed female here for evaluation of lower extremity spasms and weakness. 2002 patient developed onset of right leg stiffness, spasm, weakness. Patient was diagnosed  with cervical spine disease, bone spur ridging, and treated with anterior cervical discectomy and fusion in 2003. Following this her right leg weakness and spasms significantly improved. However one year later her symptoms return. Patient followed up with several surgeons and ultimately had a second cervical spine surgery in 2006. Following this patient had slight improvement of right leg however within several months her weakness, spasms and gait continued to decline. Patient transitioned needing a cane to walk. 2013 patient was evaluated by a neurologist in [place], to evaluate her right leg weakness. Apparently patient had a "multiple sclerosis" workup including MRI of the brain and extensive blood testing. Patient also had EMG nerve conduction studies looking for peroneal neuropathy but apparently all studies were normal. MRI of the brain did show a right medulla lesion which was reported as a stroke. Over the past 1 year patient has developed new onset left leg weakness, spasm, stiffness. Patient followed up with orthopedic surgery who then referred patient to me for evaluation. Last MRI of cervical spine was in 2006 at [facility]. No bowel or bladder incontinence. No significant numbness or tingling. No weakness in upper extremities. No vision loss, vision changes, slurred speech or trouble talking.   REVIEW OF SYSTEMS: Full 14 system review of systems performed and negative except: muscle cramps restless legs.    ALLERGIES: No Known Allergies  HOME MEDICATIONS: Outpatient Medications Prior to Visit  Medication Sig Dispense Refill  . acetaminophen (TYLENOL) 500 MG tablet Take 500 mg by mouth as needed.    . AMPYRA 10 MG TB12 Take 1 tablet by mouth twice a day  12 hours apart 180 tablet 3  . baclofen (LIORESAL) 10 MG tablet Take 1-2 tablets (10-20 mg total) by mouth 3 (three) times daily. 180 tablet 12  . Cranberry-Vitamin C-Vitamin E (CRANBERRY PLUS VITAMIN C) 4200-20-3 MG-MG-UNIT CAPS Take by  mouth.    Marland Kitchen lisinopril (PRINIVIL,ZESTRIL) 10 MG tablet Take 1 tablet by mouth daily.    Marland Kitchen MYRBETRIQ 50 MG TB24 tablet 50 mg.    . natalizumab (TYSABRI) 300 MG/15ML injection Inject into the vein.    Marland Kitchen nortriptyline (PAMELOR) 50 MG capsule Take 1 capsule by mouth daily.    . traMADol-acetaminophen (ULTRACET) 37.5-325 MG per tablet Take 1 tablet by mouth daily as needed.    . lidocaine (XYLOCAINE) 5 % ointment Apply 1 application topically as needed. 03/13/15 prescription faxed to [facility]    . alendronate (FOSAMAX) 70 MG tablet Take 1 tablet by mouth once a week.     No facility-administered medications prior to visit.     PAST MEDICAL HISTORY: Past Medical History:  Diagnosis Date  . Cervical myelopathy ([place])   . Multiple sclerosis ([place])   . Osteoporosis 2018   hip    PAST SURGICAL HISTORY: Past Surgical History:  Procedure Laterality Date  . CERVICAL FUSION  01/2001, 01/2004   C3-C6    FAMILY HISTORY: Family History  Problem Relation Age of Onset  . Transient ischemic attack Mother   . Pancreatic cancer Father     SOCIAL HISTORY:  Social History   Social History  . Marital status: Married    Spouse name: Maida Nicklaus  . Number of children: 0  . Years of education: College   Occupational History  .  Other    n/a   Social History Main Topics  . Smoking status: Former Smoker    Packs/day: 1.00    Years: 15.00    Types: Cigarettes    Quit date: 01/21/1983  . Smokeless tobacco: Never Used  . Alcohol use 0.0 oz/week     Comment: occasionally- wine  . Drug use: No  . Sexual activity: Not on file   Other Topics Concern  . Not on file   Social History Narrative   Patient lives at home with her spouse.   Caffeine Use-none     PHYSICAL EXAM  Vitals:   02/11/16 1315  BP: 133/84  Pulse: (!) 103  Weight: 114 lb 6.4 oz (51.9 kg)    Not recorded     No exam data present   Body mass index is 21.62 kg/m.  GENERAL EXAM: Patient is in no  distress; well developed, nourished and groomed; neck is supple  CARDIOVASCULAR: Regular rate and rhythm, no murmurs, no carotid bruits  NEUROLOGIC: MENTAL STATUS: awake, alert, language fluent, comprehension intact, naming intact, fund of knowledge appropriate CRANIAL NERVE: pupils equal and reactive to light, visual fields full to confrontation, extraocular muscles intact, no nystagmus, facial sensation and strength symmetric, hearing intact, palate elevates symmetrically, uvula midline, shoulder shrug symmetric, tongue midline. MOTOR: normal bulk and tone in BUE; MILD INCREASED EXT TONE IN BLE; BLE (HF 3-4, KE 5 KF 4 DF 4) SENSORY: DECR VIB AT TOES  COORDINATION: finger-nose-finger, fine finger movements --> MINIMAL ACTION TREMOR IN BUE REFLEXES: BUE 3; BLE (KNEES 3, ANKLES 2) GAIT/STATION: SPASTIC, PARAPARETIC GAIT; UNSTEADY; USES ROLLATOR WALKER      DIAGNOSTIC DATA (LABS, IMAGING, TESTING) - I reviewed patient records, labs, notes, testing and imaging myself where available.  Lab Results  Component Value Date   WBC  9.2 10/08/2015   HCT 36.8 10/08/2015   MCV 96 10/08/2015   PLT 256 10/08/2015      Component Value Date/Time   NA 146 (H) 10/08/2015 1527   K 4.8 10/08/2015 1527   CL 104 10/08/2015 1527   CO2 26 10/08/2015 1527   GLUCOSE 95 10/08/2015 1527   BUN 14 10/08/2015 1527   CREATININE 0.60 10/08/2015 1527   CALCIUM 10.5 (H) 10/08/2015 1527   PROT 6.4 10/08/2015 1527   ALBUMIN 4.5 10/08/2015 1527   AST 17 10/08/2015 1527   ALT 18 10/08/2015 1527   ALKPHOS 48 10/08/2015 1527   BILITOT 0.3 10/08/2015 1527   GFRNONAA 94 10/08/2015 1527   GFRAA 108 10/08/2015 1527   No results found for: CHOL, HDL, LDLCALC, LDLDIRECT, TRIG, CHOLHDL No results found for: HGBA1C No results found for: VITAMINB12 No results found for: TSH  Vit D, 25-Hydroxy  Date Value Ref Range Status  01/03/2015 36.8 30.0 - 100.0 ng/mL Final    Comment:    Vitamin D deficiency has been  defined by the [place] practice guideline as a level of serum 25-OH vitamin D less than 20 ng/mL (1,2). The Endocrine Society went on to further define vitamin D insufficiency as a level between 21 and 29 ng/mL (2). 1. IOM (Institute of Medicine). 2010. Dietary reference    intakes for calcium and D. [place]: The    [employer]. 2. Holick MF, Binkley Gilbert, Bischoff-Ferrari HA, et al.    Evaluation, treatment, and prevention of vitamin D    deficiency: an Endocrine Society clinical practice    guideline. JCEM. 2011 Jul; 96(7):1911-30.     I reviewed images myself. In my review, I think there is a 2cm spinal cord lesion (right side, C6-C7) and a smaller left T1 lesion, suspicious for demyelinating disease, on the MRI cervical spine from 10/08/03. No adjacent disc or bone degeneration or spinal stenosis. The MRI report states that the cord appears normal. -VRP  10/08/03 MRI cervical spine (images reviewed) - Satisfactory anterior plate fusion of C4 and C5.Disk degeneration and spondylosis at C3-4 and C5-6 with biforaminal narrowing but no significant central canal stenosis. There is no disk protrusion.   11/06/11 MRI brain (report only) - "subtle area of increased T2 intensity in the right posterior upper medulla likely representing old infarct or possibly area of previous inflammation. Otherwise normal MRI of the brain."  12/04/13 MRI brain (with and without) demonstrating: 1. Few punctate foci of right frontal subcortical and periventricular non-specific gliosis. These findings are non-specific and considerations include autoimmune, inflammatory, post-infectious, microvascular ischemic or migraine associated etiologies.  2. No abnormal enhancing lesions. No acute findings.  12/04/13 MRI cervical spine (with and without) demonstrating: 1. The spinal cord is notable for 3 subtle T2 hyperintense lesions: C5-6 on the left, C7 on the right,  T2 slightly on the right of midline. No abnormal enhancing lesions. Considerations include autoimmune, inflammatory or post-infectious etiologies. No adjacent disc disease to suggest mechanical compressive etiologies.  2. At C6-7: left uncovertebral joint hypertrophy with moderate left foraminal stenosis. 3. Compared to prior MRI on 10/08/03, the C5-6 lesion appears new; the C7 and T2 spinal cord lesions appear stable. Also, there has been change in ACDF levels (previously from C4-C5; now C3-C6).   12/04/13 MRI thoracic spine (with and without) - unremarkable  12/29/13 LP - WBC 1, RBC 0, protein 26, glucose 59, OCB > 5, gram stain neg  09/28/14 MRI  brain [I reviewed images myself and agree with interpretation. -VRP]  1. There is a small T2/FLAIR hyperintense focus in the right frontal lobe. This is nonspecific and could be idiopathic or represent a small focus of chronic microvascular ischemic change or demyelination.. The extent is within a normal range for her age. 2. There are no enhancing lesions or acute findings 3. Compared to the MRI dated 12/04/2013, there is no definite interval change.  04/11/15 MRI brain (with and without) demonstrating: 1. Subtle right and left frontal subcortical foci of non-specific gliosis. No abnormal lesions are seen on post contrast views. These findings are non-specific and considerations include autoimmune, inflammatory, post-infectious, microvascular ischemic or migraine associated etiologies.  2. No acute findings. 3. No change from MRI on 09/28/14.   10/19/15 MRI cervical spine with and without contrast showing the following: 1.   There are 3 small T2 hyperintense foci within the spinal cord, adjacent to C5-C6 to the left, adjacent to C6-C7 to the right and adjacent to T2. These foci were also noted on the 12/04/2013 MRI and appear unchanged. 2.   Surgical fusion from C3-C6. This appears unchanged. 3.   Mild degenerative changes at C2-C3 and C6-C7 that do  not lead to any nerve root compression. 4.   There is a normal enhancement pattern. There are no acute findings.  10/19/15 MRI thoracic spine with and without contrast shows the following: 1.    T2 hyperintense foci adjacent to C7-T1 to the right and adjacent to T2 to the right. Neither of these appear to be acute.  They are present on the 2015 MRIs as well. 2.    There are no significant degenerative changes. 3.    There is a normal enhancement pattern.  10/29/15 MRI brain  1.   A few scattered T2/FLAIR hyperintense foci in the subcortical deep white matter of the frontal lobes. This is a nonspecific finding and could be idiopathic or due to chronic microvascular ischemic changes, demyelination or trauma. None of the foci appears to be acute and there is no change when compared to the MRI dated 04/11/2015. 2.   There are no acute findings.  Anti-JCV antibody 01/10/14 - 0.10 negative  05/22/14 - 0.09 negative 12/2014 - negative March 2017 - negative 10/09/15 - 0.13 negative     ASSESSMENT AND PLAN  70 y.o. year old female here with bilateral lower extremity spasticity, weakness,with brisk reflexes in upper and lower extremities, left Hoffman sign, bilateral upgoing toes. MRIs from 2005 and 2015 demonstrate multiple spinal cord lesions. Now suspect demyelinating disease, with first attack in 2002, second attack in 2003, and 3rd attack in 2014.  Now some progression of symptoms since mid-2017.   Dx: multiple sclerosis   Multiple sclerosis ([place])  Weakness of both lower extremities  Spasticity  Vitamin D deficiency    PLAN: I spent 25 minutes of face to face time with patient. Greater than 50% of time was spent in counseling and coordination of care with patient. In summary we discussed: - continue tysabri - continue ampyra (will change to generic when available; walking speed and balance have improved; T25W: 46s --> 25s after ampyra) - use baclofen for spasticity - check labs  today  Meds ordered this encounter  Medications  . dalfampridine 10 MG TB12    Sig: Take 1 tablet (10 mg total) by mouth 2 (two) times daily.    Dispense:  180 tablet    Refill:  4  . baclofen (LIORESAL) 10 MG  tablet    Sig: Take 2 tablets (20 mg total) by mouth 3 (three) times daily.    Dispense:  180 tablet    Refill:  12   Return in about 6 months (around 08/10/2016).    Penni Bombard, MD [id number], [id number] PM Certified in Neurology, Neurophysiology and Neuroimaging  [facility] [address], [place], [place] [address] [phone]

## 2016-02-12 LAB — COMPREHENSIVE METABOLIC PANEL
ALT: 12 IU/L (ref 0–32)
AST: 15 IU/L (ref 0–40)
Albumin/Globulin Ratio: 2.3 — ABNORMAL HIGH (ref 1.2–2.2)
Albumin: 4.4 g/dL (ref 3.6–4.8)
Alkaline Phosphatase: 43 IU/L (ref 39–117)
BUN/Creatinine Ratio: 27 (ref 12–28)
BUN: 14 mg/dL (ref 8–27)
Bilirubin Total: 0.3 mg/dL (ref 0.0–1.2)
CO2: 29 mmol/L (ref 18–29)
Calcium: 10.3 mg/dL (ref 8.7–10.3)
Chloride: 102 mmol/L (ref 96–106)
Creatinine, Ser: 0.52 mg/dL — ABNORMAL LOW (ref 0.57–1.00)
GFR calc Af Amer: 113 mL/min/{1.73_m2} (ref >59)
GFR calc non Af Amer: 98 mL/min/{1.73_m2} (ref >59)
Globulin, Total: 1.9 g/dL (ref 1.5–4.5)
Glucose: 84 mg/dL (ref 65–99)
Potassium: 5.1 mmol/L (ref 3.5–5.2)
Sodium: 144 mmol/L (ref 134–144)
Total Protein: 6.3 g/dL (ref 6.0–8.5)

## 2016-02-12 LAB — CBC WITH DIFFERENTIAL/PLATELET
Basophils Absolute: 0 10*3/uL (ref 0.0–0.2)
Basos: 0 %
EOS (ABSOLUTE): 0.1 10*3/uL (ref 0.0–0.4)
Eos: 1 %
Hematocrit: 39.2 % (ref 34.0–46.6)
Hemoglobin: 12.6 g/dL (ref 11.1–15.9)
Immature Grans (Abs): 0 10*3/uL (ref 0.0–0.1)
Immature Granulocytes: 0 %
Lymphocytes Absolute: 3 10*3/uL (ref 0.7–3.1)
Lymphs: 28 %
MCH: 31 pg (ref 26.6–33.0)
MCHC: 32.1 g/dL (ref 31.5–35.7)
MCV: 97 fL (ref 79–97)
Monocytes Absolute: 0.7 10*3/uL (ref 0.1–0.9)
Monocytes: 6 %
Neutrophils Absolute: 6.9 10*3/uL (ref 1.4–7.0)
Neutrophils: 65 %
Platelets: 318 10*3/uL (ref 150–379)
RBC: 4.06 x10E6/uL (ref 3.77–5.28)
RDW: 14.5 % (ref 12.3–15.4)
WBC: 10.7 10*3/uL (ref 3.4–10.8)

## 2016-02-13 DIAGNOSIS — G35 Multiple sclerosis: Secondary | ICD-10-CM | POA: Diagnosis not present

## 2016-02-14 ENCOUNTER — Telehealth: Payer: Self-pay | Admitting: *Deleted

## 2016-02-14 NOTE — Telephone Encounter (Signed)
LVM per Dr Leta Baptist, informed patient her lab results are unremarkable, stable. Left number for any questions.

## 2016-02-25 ENCOUNTER — Telehealth: Payer: Self-pay | Admitting: *Deleted

## 2016-02-25 DIAGNOSIS — D235 Other benign neoplasm of skin of trunk: Secondary | ICD-10-CM | POA: Diagnosis not present

## 2016-02-25 DIAGNOSIS — L814 Other melanin hyperpigmentation: Secondary | ICD-10-CM | POA: Diagnosis not present

## 2016-02-25 DIAGNOSIS — D1801 Hemangioma of skin and subcutaneous tissue: Secondary | ICD-10-CM | POA: Diagnosis not present

## 2016-02-25 DIAGNOSIS — D485 Neoplasm of uncertain behavior of skin: Secondary | ICD-10-CM | POA: Diagnosis not present

## 2016-02-25 DIAGNOSIS — L821 Other seborrheic keratosis: Secondary | ICD-10-CM | POA: Diagnosis not present

## 2016-02-25 NOTE — Telephone Encounter (Signed)
LVM informing patient her JCV result is negative, slightly less than it was Sept 2017. Left number for any questions.

## 2016-03-12 DIAGNOSIS — G35 Multiple sclerosis: Secondary | ICD-10-CM | POA: Diagnosis not present

## 2016-04-09 DIAGNOSIS — G35 Multiple sclerosis: Secondary | ICD-10-CM | POA: Diagnosis not present

## 2016-04-24 DIAGNOSIS — M533 Sacrococcygeal disorders, not elsewhere classified: Secondary | ICD-10-CM | POA: Diagnosis not present

## 2016-04-24 DIAGNOSIS — Z79891 Long term (current) use of opiate analgesic: Secondary | ICD-10-CM | POA: Diagnosis not present

## 2016-04-24 DIAGNOSIS — G894 Chronic pain syndrome: Secondary | ICD-10-CM | POA: Diagnosis not present

## 2016-04-24 DIAGNOSIS — Z79899 Other long term (current) drug therapy: Secondary | ICD-10-CM | POA: Diagnosis not present

## 2016-04-28 ENCOUNTER — Telehealth: Payer: Self-pay | Admitting: *Deleted

## 2016-04-28 ENCOUNTER — Other Ambulatory Visit: Payer: Self-pay | Admitting: *Deleted

## 2016-04-28 DIAGNOSIS — M4696 Unspecified inflammatory spondylopathy, lumbar region: Secondary | ICD-10-CM | POA: Diagnosis not present

## 2016-04-28 DIAGNOSIS — M5137 Other intervertebral disc degeneration, lumbosacral region: Secondary | ICD-10-CM | POA: Diagnosis not present

## 2016-04-28 DIAGNOSIS — M47817 Spondylosis without myelopathy or radiculopathy, lumbosacral region: Secondary | ICD-10-CM | POA: Diagnosis not present

## 2016-04-28 DIAGNOSIS — M533 Sacrococcygeal disorders, not elsewhere classified: Secondary | ICD-10-CM | POA: Diagnosis not present

## 2016-04-28 MED ORDER — DALFAMPRIDINE ER 10 MG PO TB12: 10.0000 mg | ORAL_TABLET | Freq: Two times a day (BID) | ORAL | 4 refills | Status: DC

## 2016-04-28 NOTE — Telephone Encounter (Signed)
Spoke to [employer] at [employer],  Relating to [employer] (needing prescription).  Done.

## 2016-04-28 NOTE — Telephone Encounter (Signed)
I called and spoke to Amy Bray at [facility] for [place] on ampyra.  He stated there is an approval for this from 04-06-16 thru 04-06-2017.  He will fax over to me at [phone].  02-11-2016 last ofv note states Walking speed approval T25W 46s to 25s after ampyra.

## 2016-04-29 NOTE — Telephone Encounter (Signed)
Spoke to [place] at [employer]  She relayed needed new prescription.  Also requested ofv note.  This done.  Fax confirmation received [phone].

## 2016-05-07 DIAGNOSIS — G35 Multiple sclerosis: Secondary | ICD-10-CM | POA: Diagnosis not present

## 2016-06-04 DIAGNOSIS — G35 Multiple sclerosis: Secondary | ICD-10-CM | POA: Diagnosis not present

## 2016-06-23 DIAGNOSIS — G35 Multiple sclerosis: Secondary | ICD-10-CM | POA: Diagnosis not present

## 2016-06-23 DIAGNOSIS — H35363 Drusen (degenerative) of macula, bilateral: Secondary | ICD-10-CM | POA: Diagnosis not present

## 2016-07-02 DIAGNOSIS — G35 Multiple sclerosis: Secondary | ICD-10-CM | POA: Diagnosis not present

## 2016-07-11 ENCOUNTER — Other Ambulatory Visit: Payer: Self-pay | Admitting: Family Medicine

## 2016-07-11 DIAGNOSIS — Z1231 Encounter for screening mammogram for malignant neoplasm of breast: Secondary | ICD-10-CM

## 2016-07-28 DIAGNOSIS — M5137 Other intervertebral disc degeneration, lumbosacral region: Secondary | ICD-10-CM | POA: Diagnosis not present

## 2016-07-28 DIAGNOSIS — M47817 Spondylosis without myelopathy or radiculopathy, lumbosacral region: Secondary | ICD-10-CM | POA: Diagnosis not present

## 2016-07-28 DIAGNOSIS — Z79891 Long term (current) use of opiate analgesic: Secondary | ICD-10-CM | POA: Diagnosis not present

## 2016-07-28 DIAGNOSIS — M533 Sacrococcygeal disorders, not elsewhere classified: Secondary | ICD-10-CM | POA: Diagnosis not present

## 2016-07-28 DIAGNOSIS — M4696 Unspecified inflammatory spondylopathy, lumbar region: Secondary | ICD-10-CM | POA: Diagnosis not present

## 2016-07-28 DIAGNOSIS — Z79899 Other long term (current) drug therapy: Secondary | ICD-10-CM | POA: Diagnosis not present

## 2016-07-28 DIAGNOSIS — G894 Chronic pain syndrome: Secondary | ICD-10-CM | POA: Diagnosis not present

## 2016-07-29 DIAGNOSIS — Z79899 Other long term (current) drug therapy: Secondary | ICD-10-CM | POA: Diagnosis not present

## 2016-07-29 DIAGNOSIS — G894 Chronic pain syndrome: Secondary | ICD-10-CM | POA: Diagnosis not present

## 2016-07-29 DIAGNOSIS — Z79891 Long term (current) use of opiate analgesic: Secondary | ICD-10-CM | POA: Diagnosis not present

## 2016-07-30 DIAGNOSIS — G35 Multiple sclerosis: Secondary | ICD-10-CM | POA: Diagnosis not present

## 2016-08-04 DIAGNOSIS — M810 Age-related osteoporosis without current pathological fracture: Secondary | ICD-10-CM | POA: Diagnosis not present

## 2016-08-11 ENCOUNTER — Encounter: Payer: Self-pay | Admitting: Diagnostic Neuroimaging

## 2016-08-11 ENCOUNTER — Telehealth: Payer: Self-pay | Admitting: Diagnostic Neuroimaging

## 2016-08-11 ENCOUNTER — Ambulatory Visit (INDEPENDENT_AMBULATORY_CARE_PROVIDER_SITE_OTHER): Payer: Medicare Other | Admitting: Diagnostic Neuroimaging

## 2016-08-11 VITALS — BP 103/72 | HR 95 | Ht 61.0 in | Wt 111.8 lb

## 2016-08-11 DIAGNOSIS — E559 Vitamin D deficiency, unspecified: Secondary | ICD-10-CM | POA: Diagnosis not present

## 2016-08-11 DIAGNOSIS — G35 Multiple sclerosis: Secondary | ICD-10-CM | POA: Diagnosis not present

## 2016-08-11 DIAGNOSIS — R252 Cramp and spasm: Secondary | ICD-10-CM | POA: Diagnosis not present

## 2016-08-11 MED ORDER — BACLOFEN 10 MG PO TABS: 20.0000 mg | ORAL_TABLET | Freq: Three times a day (TID) | ORAL | 12 refills | Status: DC

## 2016-08-11 NOTE — Telephone Encounter (Signed)
Amy Bray Please call patient to reschedule her Botox Apt.

## 2016-08-11 NOTE — Patient Instructions (Signed)
-   check MRI brain and labs  - will setup botox injections with Dr. [name]

## 2016-08-11 NOTE — Progress Notes (Signed)
[facility]  PATIENT: Amy Bray DOB: Mar 12, 1945  REFERRING CLINICIAN:  HISTORY FROM: patient and husband  REASON FOR VISIT: follow up   HISTORICAL  CHIEF COMPLAINT:  Chief Complaint  Patient presents with  . Follow-up  . Multiple Sclerosis    getting tysabri, via intrafusion.  [place] for generic amypra.  No falls,  using rollator walker.  no change since on mybetriq.      HISTORY OF PRESENT ILLNESS:   UPDATE 08/11/16: Since last visit, doing well. Tolerating tysabri and baclofen. Heat has been challenging with spasms. Gait diff with knees rubbing together, slightly worse.  UPDATE 02/11/16: Since last visit, balance diff continues. No flares. MRIs stable. Tolerating tysabri.   UPDATE 10/08/15: Since last visit, pt feels some progression of gait diff and walking problems. No specific attack or flare up.  UPDATE 07/04/15: Since last visit, doing well. Tolerating tysabri and ampyra.  UPDATE 04/02/15: Since last visit, doing well. Tolerating tysabri and ampyra. Back on nortriptyline (is helping burning pain in feet).   UPDATE 01/03/15: Since last visit, doing better on ampyra (better gait and walking speed). More foot/calf tingling burning pain x 1-2 months. Now with Dr. Andree Elk pain mgmt for back injections x 2, which is helping.  UPDATE 10/02/14: Since last visit, doing well. Requesting transition of care to [place] for pain mgmt; previously at [facility] with Dr. Oliva Bustard.  UPDATE 05/22/14: Since last visit, doing well. On tysabri since March 2016. Fatigue and bladder control much improved. Gait is stable.   UPDATE 02/20/14: Since last visit, had reviewed lab results and now ready to start MS medication. Now would like to start tysabri.  UPDATE 01/09/14: Since last visit, no new events symptoms. LP results reviewed. Didn't get lab work at last visit for MS mimics.   UPDATE 12/07/13: Patient is stable with symptoms. MRI scans reviewed. Continues to have weakness in  legs and balance difficulty.  UPDATE 11/21/13: 71 year old right-handed female here for evaluation of lower extremity spasms and weakness. 2002 patient developed onset of right leg stiffness, spasm, weakness. Patient was diagnosed with cervical spine disease, bone spur ridging, and treated with anterior cervical discectomy and fusion in 2003. Following this her right leg weakness and spasms significantly improved. However one year later her symptoms return. Patient followed up with several surgeons and ultimately had a second cervical spine surgery in 2006. Following this patient had slight improvement of right leg however within several months her weakness, spasms and gait continued to decline. Patient transitioned needing a cane to walk. 2013 patient was evaluated by a neurologist in [place], to evaluate her right leg weakness. Apparently patient had a "multiple sclerosis" workup including MRI of the brain and extensive blood testing. Patient also had EMG nerve conduction studies looking for peroneal neuropathy but apparently all studies were normal. MRI of the brain did show a right medulla lesion which was reported as a stroke. Over the past 1 year patient has developed new onset left leg weakness, spasm, stiffness. Patient followed up with orthopedic surgery who then referred patient to me for evaluation. Last MRI of cervical spine was in 2006 at [facility]. No bowel or bladder incontinence. No significant numbness or tingling. No weakness in upper extremities. No vision loss, vision changes, slurred speech or trouble talking.   REVIEW OF SYSTEMS: Full 14 system review of systems performed and negative except: muscle cramps restless legs.    ALLERGIES: No Known Allergies  HOME MEDICATIONS: Outpatient Medications Prior to Visit  Medication Sig Dispense Refill  . acetaminophen (TYLENOL) 500 MG tablet Take 500 mg by mouth as needed.    . baclofen (LIORESAL) 10 MG tablet Take 2 tablets (20 mg total)  by mouth 3 (three) times daily. 180 tablet 12  . Cranberry-Vitamin C-Vitamin E (CRANBERRY PLUS VITAMIN C) 4200-20-3 MG-MG-UNIT CAPS Take by mouth.    . denosumab (PROLIA) 60 MG/ML SOLN injection Inject 60 mg into the skin every 6 (six) months. Administer in upper arm, thigh, or abdomen    . lisinopril (PRINIVIL,ZESTRIL) 10 MG tablet Take 1 tablet by mouth daily.    Marland Kitchen MYRBETRIQ 50 MG TB24 tablet 50 mg.    . natalizumab (TYSABRI) 300 MG/15ML injection Inject into the vein.    Marland Kitchen nortriptyline (PAMELOR) 50 MG capsule Take 1 capsule by mouth daily.    . traMADol-acetaminophen (ULTRACET) 37.5-325 MG per tablet Take 1 tablet by mouth daily as needed.    . dalfampridine 10 MG TB12 Take 1 tablet (10 mg total) by mouth 2 (two) times daily. (Patient not taking: Reported on 08/11/2016) 180 tablet 4  . AMPYRA 10 MG TB12 Take 1 tablet by mouth twice a day 12 hours apart (Patient not taking: Reported on 08/11/2016) 180 tablet 3  . lidocaine (XYLOCAINE) 5 % ointment Apply 1 application topically as needed. 03/13/15 prescription faxed to [facility]     No facility-administered medications prior to visit.     PAST MEDICAL HISTORY: Past Medical History:  Diagnosis Date  . Cervical myelopathy ([place])   . Multiple sclerosis ([place])   . Osteoporosis 2018   hip    PAST SURGICAL HISTORY: Past Surgical History:  Procedure Laterality Date  . CERVICAL FUSION  01/2001, 01/2004   C3-C6    FAMILY HISTORY: Family History  Problem Relation Age of Onset  . Transient ischemic attack Mother   . Pancreatic cancer Father     SOCIAL HISTORY:  Social History   Social History  . Marital status: Married    Spouse name: Britanie Harshman  . Number of children: 0  . Years of education: College   Occupational History  .  Other    n/a   Social History Main Topics  . Smoking status: Former Smoker    Packs/day: 1.00    Years: 15.00    Types: Cigarettes    Quit date: 01/21/1983  . Smokeless tobacco: Never Used  .  Alcohol use 0.0 oz/week     Comment: occasionally- wine  . Drug use: No  . Sexual activity: Not on file   Other Topics Concern  . Not on file   Social History Narrative   Patient lives at home with her spouse.   Caffeine Use-none     PHYSICAL EXAM  Vitals:   08/11/16 1411  BP: 103/72  Pulse: 95  Weight: 111 lb 12.8 oz (50.7 kg)  Height: 5\' 1"  (1.549 m)    Not recorded      Visual Acuity Screening   Right eye Left eye Both eyes  Without correction:     With correction: 20/30 20/30      Body mass index is 21.12 kg/m.  GENERAL EXAM: Patient is in no distress; well developed, nourished and groomed; neck is supple  CARDIOVASCULAR: Regular rate and rhythm, no murmurs, no carotid bruits  NEUROLOGIC: MENTAL STATUS: awake, alert, language fluent, comprehension intact, naming intact, fund of knowledge appropriate CRANIAL NERVE: pupils equal and reactive to light, visual fields full to confrontation, extraocular muscles intact, no nystagmus, facial  sensation and strength symmetric, hearing intact, palate elevates symmetrically, uvula midline, shoulder shrug symmetric, tongue midline. MOTOR: normal bulk and tone in BUE; MILD INCREASED EXT TONE IN BLE; BLE (HF 3-4, KE 5 KF 4 DF 4) SENSORY: DECR VIB AT TOES  COORDINATION: finger-nose-finger, fine finger movements --> MINIMAL ACTION TREMOR IN BUE REFLEXES: BUE 3; BLE (KNEES 3, ANKLES 2) GAIT/STATION: SPASTIC, PARAPARETIC GAIT; ADDUCTOR SPASM; UNSTEADY; USES ROLLATOR WALKER      DIAGNOSTIC DATA (LABS, IMAGING, TESTING) - I reviewed patient records, labs, notes, testing and imaging myself where available.  Lab Results  Component Value Date   WBC 10.7 02/11/2016   HGB 12.6 02/11/2016   HCT 39.2 02/11/2016   MCV 97 02/11/2016   PLT 318 02/11/2016      Component Value Date/Time   NA 144 02/11/2016 1539   K 5.1 02/11/2016 1539   CL 102 02/11/2016 1539   CO2 29 02/11/2016 1539   GLUCOSE 84 02/11/2016 1539   BUN 14  02/11/2016 1539   CREATININE 0.52 (L) 02/11/2016 1539   CALCIUM 10.3 02/11/2016 1539   PROT 6.3 02/11/2016 1539   ALBUMIN 4.4 02/11/2016 1539   AST 15 02/11/2016 1539   ALT 12 02/11/2016 1539   ALKPHOS 43 02/11/2016 1539   BILITOT 0.3 02/11/2016 1539   GFRNONAA 98 02/11/2016 1539   GFRAA 113 02/11/2016 1539   No results found for: CHOL, HDL, LDLCALC, LDLDIRECT, TRIG, CHOLHDL No results found for: HGBA1C No results found for: VITAMINB12 No results found for: TSH  Vit D, 25-Hydroxy  Date Value Ref Range Status  01/03/2015 36.8 30.0 - 100.0 ng/mL Final    Comment:    Vitamin D deficiency has been defined by the [place] practice guideline as a level of serum 25-OH vitamin D less than 20 ng/mL (1,2). The Endocrine Society went on to further define vitamin D insufficiency as a level between 21 and 29 ng/mL (2). 1. IOM (Institute of Medicine). 2010. Dietary reference    intakes for calcium and D. [place]: The    [employer]. 2. Holick MF, Binkley , Bischoff-Ferrari HA, et al.    Evaluation, treatment, and prevention of vitamin D    deficiency: an Endocrine Society clinical practice    guideline. JCEM. 2011 Jul; 96(7):1911-30.     I reviewed images myself. In my review, I think there is a 2cm spinal cord lesion (right side, C6-C7) and a smaller left T1 lesion, suspicious for demyelinating disease, on the MRI cervical spine from 10/08/03. No adjacent disc or bone degeneration or spinal stenosis. The MRI report states that the cord appears normal. -VRP  10/08/03 MRI cervical spine (images reviewed) - Satisfactory anterior plate fusion of C4 and C5.Disk degeneration and spondylosis at C3-4 and C5-6 with biforaminal narrowing but no significant central canal stenosis. There is no disk protrusion.   11/06/11 MRI brain (report only) - "subtle area of increased T2 intensity in the right posterior upper medulla likely representing  old infarct or possibly area of previous inflammation. Otherwise normal MRI of the brain."  12/04/13 MRI brain (with and without) demonstrating: 1. Few punctate foci of right frontal subcortical and periventricular non-specific gliosis. These findings are non-specific and considerations include autoimmune, inflammatory, post-infectious, microvascular ischemic or migraine associated etiologies.  2. No abnormal enhancing lesions. No acute findings.  12/04/13 MRI cervical spine (with and without) demonstrating: 1. The spinal cord is notable for 3 subtle T2 hyperintense lesions: C5-6 on the left,  C7 on the right, T2 slightly on the right of midline. No abnormal enhancing lesions. Considerations include autoimmune, inflammatory or post-infectious etiologies. No adjacent disc disease to suggest mechanical compressive etiologies.  2. At C6-7: left uncovertebral joint hypertrophy with moderate left foraminal stenosis. 3. Compared to prior MRI on 10/08/03, the C5-6 lesion appears new; the C7 and T2 spinal cord lesions appear stable. Also, there has been change in ACDF levels (previously from C4-C5; now C3-C6).   12/04/13 MRI thoracic spine (with and without) - unremarkable  12/29/13 LP - WBC 1, RBC 0, protein 26, glucose 59, OCB > 5, gram stain neg  09/28/14 MRI brain [I reviewed images myself and agree with interpretation. -VRP]  1. There is a small T2/FLAIR hyperintense focus in the right frontal lobe. This is nonspecific and could be idiopathic or represent a small focus of chronic microvascular ischemic change or demyelination.. The extent is within a normal range for her age. 2. There are no enhancing lesions or acute findings 3. Compared to the MRI dated 12/04/2013, there is no definite interval change.  04/11/15 MRI brain (with and without) demonstrating: 1. Subtle right and left frontal subcortical foci of non-specific gliosis. No abnormal lesions are seen on post contrast views. These  findings are non-specific and considerations include autoimmune, inflammatory, post-infectious, microvascular ischemic or migraine associated etiologies.  2. No acute findings. 3. No change from MRI on 09/28/14.   10/19/15 MRI cervical spine with and without contrast showing the following: 1.   There are 3 small T2 hyperintense foci within the spinal cord, adjacent to C5-C6 to the left, adjacent to C6-C7 to the right and adjacent to T2. These foci were also noted on the 12/04/2013 MRI and appear unchanged. 2.   Surgical fusion from C3-C6. This appears unchanged. 3.   Mild degenerative changes at C2-C3 and C6-C7 that do not lead to any nerve root compression. 4.   There is a normal enhancement pattern. There are no acute findings.  10/19/15 MRI thoracic spine with and without contrast shows the following: 1.    T2 hyperintense foci adjacent to C7-T1 to the right and adjacent to T2 to the right. Neither of these appear to be acute.  They are present on the 2015 MRIs as well. 2.    There are no significant degenerative changes. 3.    There is a normal enhancement pattern.  10/29/15 MRI brain  1.   A few scattered T2/FLAIR hyperintense foci in the subcortical deep white matter of the frontal lobes. This is a nonspecific finding and could be idiopathic or due to chronic microvascular ischemic changes, demyelination or trauma. None of the foci appears to be acute and there is no change when compared to the MRI dated 04/11/2015. 2.   There are no acute findings.  Anti-JCV antibody 01/10/14 - 0.10 negative  05/22/14 - 0.09 negative 12/2014 - negative March 2017 - negative 10/09/15 - 0.13 negative     ASSESSMENT AND PLAN  71 y.o. year old female here with  bilateral lower extremity spasticity, weakness,with brisk reflexes in upper and lower extremities, left Hoffman sign, bilateral upgoing toes. MRIs from 2005 and 2015 demonstrate multiple spinal cord lesions. Now suspect demyelinating disease, with  first attack in 2002, second attack in 2003, and 3rd attack in 2014.  Then with some progression of symptoms since mid-2017.   Dx: multiple sclerosis   MS (multiple sclerosis) ([place]) - Plan: MR BRAIN W WO CONTRAST, CBC with Differential/Platelet, Comprehensive metabolic panel, Stratify  JCV Antibody Test ([facility])  Spasticity  Vitamin D deficiency    PLAN:  I spent 25 minutes of face to face time with patient. Greater than 50% of time was spent in counseling and coordination of care with patient. In summary we discussed:   MULTIPLE SCLEROSIS  - continue tysabri - check MRI brain (slightly worsening gait) - check labs today - will switch to generic ampyra when available; walking speed and balance has improved; T25W: 46s --> 25s after ampyra)  MUSCLE SPASMS - continue baclofen for spasticity - will refer to Dr. [name] for brief consult and botox injection for MS spasticity (~300-500 units botox)  Meds ordered this encounter  Medications  . baclofen (LIORESAL) 10 MG tablet    Sig: Take 2 tablets (20 mg total) by mouth 3 (three) times daily.    Dispense:  180 tablet    Refill:  12   Orders Placed This Encounter  Procedures  . MR BRAIN W WO CONTRAST  . CBC with Differential/Platelet  . Comprehensive metabolic panel  . Stratify JCV Antibody Test ([facility])   Return in about 6 months (around 02/11/2017).     Penni Bombard, MD 5/46/5681, 2:75 PM Certified in Neurology, Neurophysiology and Neuroimaging  [facility] [address], [place], [place] [address] [phone]

## 2016-08-12 ENCOUNTER — Telehealth: Payer: Self-pay | Admitting: Diagnostic Neuroimaging

## 2016-08-12 LAB — COMPREHENSIVE METABOLIC PANEL
ALT: 11 IU/L (ref 0–32)
AST: 18 IU/L (ref 0–40)
Albumin/Globulin Ratio: 2.2 (ref 1.2–2.2)
Albumin: 4.6 g/dL (ref 3.6–4.8)
Alkaline Phosphatase: 31 IU/L — ABNORMAL LOW (ref 39–117)
BUN/Creatinine Ratio: 27 (ref 12–28)
BUN: 16 mg/dL (ref 8–27)
Bilirubin Total: 0.3 mg/dL (ref 0.0–1.2)
CO2: 25 mmol/L (ref 20–29)
Calcium: 9.9 mg/dL (ref 8.7–10.3)
Chloride: 99 mmol/L (ref 96–106)
Creatinine, Ser: 0.6 mg/dL (ref 0.57–1.00)
GFR calc Af Amer: 108 mL/min/{1.73_m2} (ref >59)
GFR calc non Af Amer: 93 mL/min/{1.73_m2} (ref >59)
Globulin, Total: 2.1 g/dL (ref 1.5–4.5)
Glucose: 81 mg/dL (ref 65–99)
Potassium: 4.5 mmol/L (ref 3.5–5.2)
Sodium: 141 mmol/L (ref 134–144)
Total Protein: 6.7 g/dL (ref 6.0–8.5)

## 2016-08-12 LAB — CBC WITH DIFFERENTIAL/PLATELET
Basophils Absolute: 0.1 10*3/uL (ref 0.0–0.2)
Basos: 1 %
EOS (ABSOLUTE): 0.1 10*3/uL (ref 0.0–0.4)
Eos: 1 %
Hematocrit: 35.9 % (ref 34.0–46.6)
Hemoglobin: 11.8 g/dL (ref 11.1–15.9)
Immature Grans (Abs): 0.1 10*3/uL (ref 0.0–0.1)
Immature Granulocytes: 1 %
Lymphocytes Absolute: 3.6 10*3/uL — ABNORMAL HIGH (ref 0.7–3.1)
Lymphs: 41 %
MCH: 31.9 pg (ref 26.6–33.0)
MCHC: 32.9 g/dL (ref 31.5–35.7)
MCV: 97 fL (ref 79–97)
Monocytes Absolute: 0.8 10*3/uL (ref 0.1–0.9)
Monocytes: 9 %
Neutrophils Absolute: 4.2 10*3/uL (ref 1.4–7.0)
Neutrophils: 47 %
Platelets: 326 10*3/uL (ref 150–379)
RBC: 3.7 x10E6/uL — ABNORMAL LOW (ref 3.77–5.28)
RDW: 14.9 % (ref 12.3–15.4)
WBC: 8.7 10*3/uL (ref 3.4–10.8)

## 2016-08-12 NOTE — Telephone Encounter (Signed)
I called patient to inform her that I have scheduled her for an apt with Dr. [name] for her injections. If she calls back please relay this information to her and verify that this apt will work.

## 2016-08-13 ENCOUNTER — Telehealth: Payer: Self-pay

## 2016-08-13 NOTE — Telephone Encounter (Signed)
-----   Message from Penni Bombard, MD sent at 08/13/2016  5:07 PM EDT ----- Unremarkable labs. Continue current plan. Please call patient. -VRP

## 2016-08-13 NOTE — Telephone Encounter (Signed)
I called pt to discuss her labs. No answer, left a message asking her to call us back.

## 2016-08-13 NOTE — Progress Notes (Signed)
JCV antibody test sent to [facility] 08-13-16.  Conf # [id number]. sy

## 2016-08-14 NOTE — Telephone Encounter (Signed)
Pt returned my call. I advised her that Dr. Leta Baptist reviewed pt's lab work and advised [place] that pt's labs are unremarkable and pt should continue the current plan. Pt verbalized understanding of results. Pt had no questions at this time but was encouraged to call back if questions arise.

## 2016-08-18 ENCOUNTER — Ambulatory Visit
Admission: RE | Admit: 2016-08-18 | Discharge: 2016-08-18 | Disposition: A | Discharge status: Home / Self Care | Payer: Medicare Other | Source: Ambulatory Visit | Attending: Family Medicine | Admitting: Family Medicine

## 2016-08-18 DIAGNOSIS — Z1231 Encounter for screening mammogram for malignant neoplasm of breast: Secondary | ICD-10-CM | POA: Diagnosis not present

## 2016-08-18 NOTE — Telephone Encounter (Signed)
Called pt and LMVM for her on her mobile, that lab result back, to TCB for result.  If she calls back her JCV negative. (ok to give her result).

## 2016-08-18 NOTE — Telephone Encounter (Signed)
Spoke to pt and relayed that the JCV antibody was negative.  She verbalized understanding.

## 2016-08-20 ENCOUNTER — Other Ambulatory Visit: Payer: Self-pay | Admitting: Family Medicine

## 2016-08-20 DIAGNOSIS — R928 Other abnormal and inconclusive findings on diagnostic imaging of breast: Secondary | ICD-10-CM

## 2016-08-22 ENCOUNTER — Ambulatory Visit
Admission: RE | Admit: 2016-08-22 | Discharge: 2016-08-22 | Disposition: A | Discharge status: Home / Self Care | Payer: Medicare Other | Source: Ambulatory Visit | Attending: Family Medicine | Admitting: Family Medicine

## 2016-08-22 ENCOUNTER — Ambulatory Visit: Payer: Medicare Other

## 2016-08-22 DIAGNOSIS — R928 Other abnormal and inconclusive findings on diagnostic imaging of breast: Secondary | ICD-10-CM

## 2016-08-22 DIAGNOSIS — R922 Inconclusive mammogram: Secondary | ICD-10-CM | POA: Diagnosis not present

## 2016-08-24 ENCOUNTER — Ambulatory Visit
Admission: RE | Admit: 2016-08-24 | Discharge: 2016-08-24 | Disposition: A | Discharge status: Home / Self Care | Payer: Medicare Other | Source: Ambulatory Visit | Attending: Diagnostic Neuroimaging | Admitting: Diagnostic Neuroimaging

## 2016-08-24 DIAGNOSIS — G35 Multiple sclerosis: Secondary | ICD-10-CM

## 2016-08-24 DIAGNOSIS — G809 Cerebral palsy, unspecified: Secondary | ICD-10-CM | POA: Diagnosis not present

## 2016-08-24 MED ORDER — GADOBENATE DIMEGLUMINE 529 MG/ML IV SOLN
9.0000 mL | Freq: Once | INTRAVENOUS | Status: AC | PRN
  Administered 2016-08-24: 9 mL via INTRAVENOUS

## 2016-08-27 DIAGNOSIS — G35 Multiple sclerosis: Secondary | ICD-10-CM | POA: Diagnosis not present

## 2016-09-09 ENCOUNTER — Telehealth: Payer: Self-pay | Admitting: *Deleted

## 2016-09-09 NOTE — Telephone Encounter (Signed)
LVM informing patient her MRI brain results are stable, advised Dr Leta Baptist will continue with her current treatment plan. Left number for any questions.

## 2016-09-11 ENCOUNTER — Ambulatory Visit (INDEPENDENT_AMBULATORY_CARE_PROVIDER_SITE_OTHER): Payer: Medicare Other | Admitting: Neurology

## 2016-09-11 ENCOUNTER — Encounter: Payer: Self-pay | Admitting: Neurology

## 2016-09-11 VITALS — BP 111/72 | HR 82 | Ht 61.0 in | Wt 111.5 lb

## 2016-09-11 DIAGNOSIS — G35 Multiple sclerosis: Secondary | ICD-10-CM | POA: Insufficient documentation

## 2016-09-11 DIAGNOSIS — M629 Disorder of muscle, unspecified: Secondary | ICD-10-CM

## 2016-09-11 DIAGNOSIS — G839 Paralytic syndrome, unspecified: Secondary | ICD-10-CM | POA: Insufficient documentation

## 2016-09-11 DIAGNOSIS — G8220 Paraplegia, unspecified: Secondary | ICD-10-CM

## 2016-09-11 MED ORDER — INCOBOTULINUMTOXINA 100 UNITS IM SOLR
400.0000 [IU] | INTRAMUSCULAR | Status: DC
  Administered 2016-09-11: 400 [IU] via INTRAMUSCULAR

## 2016-09-11 NOTE — Progress Notes (Signed)
**  Xeomin 100 units x 3 vials, NDC 0973-5329-92, Lot [id number], Exp 09/2018, office supply.//mck,rn**

## 2016-09-11 NOTE — Progress Notes (Signed)
PATIENT: Amy Bray DOB: 24-Jul-1945    HISTORICAL  Amy Bray is a 72 years old right-handed female , seen in refer by my colleague Dr. Leta Baptist for evaluation of EMG guided botulism toxin injection for bilateral lower extremity spasticity, gait abnormality, initial evaluation was on September 11 2016.   This is the first time I have seen this patient, I also performed electrical stimulation guided xeomin injection for spastic paraplegia, I used 400 units of xeomin on September 11 2016  She presented with right leg stiffness, spasm, weakness in 2002, was diagnosed with cervical spine disease, had anterior cervical vasectomy and fusion in 2003. Following the surgery, her right leg weakness and spasms significantly improved, however a year later the symptoms returned, she ultimately had second cervical spine surgery in 2006, following second surgery, she only has mild improvement of her symptoms,  Over the years, she continue have progressive worsening spastic bilateral lower extremity, gait abnormality, eventually leading to this diagnosis of relapsing remitting multiple sclerosis,  She was treated with Amy Bray over the past few years, her symptoms has been steady.  We have personally reviewed most recent MRI cervical spine in September 2017, 3 small T2 hyper intensity foci within the spinal cord, adjacent to C5-6 to the left, adjacent to C6-7 to the right, and adjacent to T2, no change compared to previous scan in 2015, evidence of surgical fusion from C3-C6.  MRI of brain showed few scattered T2/FLAIR hyperintensity foci in the subcortical deep matter of the frontal area,  Since Tysarbri infusion, her symptoms overall has been steady, but over the years, she noticed increased bilateral lower extremity muscle spasticity, especially at nighttime, she described her left joint worsens stomach, jerking movement, while ambulating, she noticed bilateral knees rubbing towards each other,  bilateral leg tends to "worse each other. She rely on her walker now.  Previously she had EMG guided Botox injection at [facility] for bilateral lower extremity spasticity  with very good result   REVIEW OF SYSTEMS: Full 14 system review of systems performed and notable only for gait abnormality, back pain   ALLERGIES: No Known Allergies  HOME MEDICATIONS: Current Outpatient Prescriptions  Medication Sig Dispense Refill  . acetaminophen (TYLENOL) 500 MG tablet Take 500 mg by mouth as needed.    . baclofen (LIORESAL) 10 MG tablet Take 2 tablets (20 mg total) by mouth 3 (three) times daily. 180 tablet 12  . Cranberry-Vitamin C-Vitamin E (CRANBERRY PLUS VITAMIN C) 4200-20-3 MG-MG-UNIT CAPS Take by mouth.    . dalfampridine 10 MG TB12 Take 1 tablet (10 mg total) by mouth 2 (two) times daily. (Patient not taking: Reported on 08/11/2016) 180 tablet 4  . denosumab (PROLIA) 60 MG/ML SOLN injection Inject 60 mg into the skin every 6 (six) months. Administer in upper arm, thigh, or abdomen    . lisinopril (PRINIVIL,ZESTRIL) 10 MG tablet Take 1 tablet by mouth daily.    Marland Kitchen MYRBETRIQ 50 MG TB24 tablet 50 mg.    . natalizumab (TYSABRI) 300 MG/15ML injection Inject into the vein.    Marland Kitchen nortriptyline (PAMELOR) 50 MG capsule Take 1 capsule by mouth daily.    . traMADol-acetaminophen (ULTRACET) 37.5-325 MG per tablet Take 1 tablet by mouth daily as needed.     No current facility-administered medications for this visit.     PAST MEDICAL HISTORY: Past Medical History:  Diagnosis Date  . Cervical myelopathy ([place])   . Multiple sclerosis ([place])   . Osteoporosis 2018   hip  PAST SURGICAL HISTORY: Past Surgical History:  Procedure Laterality Date  . CERVICAL FUSION  01/2001, 01/2004   C3-C6    FAMILY HISTORY: Family History  Problem Relation Age of Onset  . Transient ischemic attack Mother   . Pancreatic cancer Father   . Breast cancer Cousin     SOCIAL HISTORY:  Social History   Social  History  . Marital status: Married    Spouse name: Amy Bray  . Number of children: 0  . Years of education: College   Occupational History  .  Other    n/a   Social History Main Topics  . Smoking status: Former Smoker    Packs/day: 1.00    Years: 15.00    Types: Cigarettes    Quit date: 01/21/1983  . Smokeless tobacco: Never Used  . Alcohol use 0.0 oz/week     Comment: occasionally- wine  . Drug use: No  . Sexual activity: Not on file   Other Topics Concern  . Not on file   Social History Narrative   Patient lives at home with her spouse.   Caffeine Use-none     PHYSICAL EXAM   There were no vitals filed for this visit.  Not recorded      There is no height or weight on file to calculate BMI.  PHYSICAL EXAMNIATION:  Gen: NAD, conversant, well nourised, obese, well groomed                     Cardiovascular: Regular rate rhythm, no peripheral edema, warm, nontender. Eyes: Conjunctivae clear without exudates or hemorrhage Neck: Supple, no carotid bruits. Pulmonary: Clear to auscultation bilaterally   NEUROLOGICAL EXAM:  MENTAL STATUS: Speech:    Speech is normal; fluent and spontaneous with normal comprehension.  Cognition:     Orientation to time, place and person     Normal recent and remote memory     Normal Attention span and concentration     Normal Language, naming, repeating,spontaneous speech     Fund of knowledge   CRANIAL NERVES: CN II: Visual fields are full to confrontation. Fundoscopic exam is normal with sharp discs and no vascular changes. Pupils are round equal and briskly reactive to light. CN III, IV, VI: extraocular movement are normal. No ptosis. CN V: Facial sensation is intact to pinprick in all 3 divisions bilaterally. Corneal responses are intact.  CN VII: Face is symmetric with normal eye closure and smile. CN VIII: Hearing is normal to rubbing fingers CN IX, X: Palate elevates symmetrically. Phonation is normal. CN XI: Head  turning and shoulder shrug are intact CN XII: Tongue is midline with normal movements and no atrophy.  MOTOR: She has moderate spasticity of bilateral lower extremity, mild to moderate proximal lower extremity weakness,   REFLEXES: Reflexes are 2+ and symmetric at the biceps, triceps, knees, and ankles. Plantar responses Extensor bilaterally  Sensory: Intact to light touch  COORDINATION: Rapid alternating movements and fine finger movements are intact. There is no dysmetria on finger-to-nose and heel-knee-shin.    GAIT/STANCE: She rely on her walker to get up from seated position, significant bilateral adductor muscle spasm, rubbing her knees together while walking, spastic, unsteady slow cautious gait, bilateral flat feet, difficulty to clear bilateral feet from the floor DIAGNOSTIC DATA (LABS, IMAGING, TESTING)  - I reviewed patient records, labs, notes, testing and imaging myself where available.   ASSESSMENT AND PLAN  Amy Bray is a 71 y.o. female   Spastic  paraparesis.  Relapsing remitting multiple sclerosis with cervical cord involvement, history of spondylitic cervical myelopathy   Under electric stimulation we have performed xeomin injection today use 400 units  Right adductor longus 175 units Left adductor longus 175 units  Divided into 7 injection locations at each side  Right iliopsoas muscle 25 units Left iliopsoas muscle 25 units   Patient tolerate the injection well, returned to 3 months for repeat injection   Amy Bray, M.D. Ph.D.  [facility] [address], [place], [place] [address] Ph: [phone] Fax: [phone]  CC: Hulan Fess, MD

## 2016-09-24 DIAGNOSIS — G35 Multiple sclerosis: Secondary | ICD-10-CM | POA: Diagnosis not present

## 2016-10-20 DIAGNOSIS — M533 Sacrococcygeal disorders, not elsewhere classified: Secondary | ICD-10-CM | POA: Diagnosis not present

## 2016-10-20 DIAGNOSIS — M5137 Other intervertebral disc degeneration, lumbosacral region: Secondary | ICD-10-CM | POA: Diagnosis not present

## 2016-10-20 DIAGNOSIS — M47817 Spondylosis without myelopathy or radiculopathy, lumbosacral region: Secondary | ICD-10-CM | POA: Diagnosis not present

## 2016-10-20 DIAGNOSIS — G894 Chronic pain syndrome: Secondary | ICD-10-CM | POA: Diagnosis not present

## 2016-10-20 DIAGNOSIS — Z79899 Other long term (current) drug therapy: Secondary | ICD-10-CM | POA: Diagnosis not present

## 2016-10-20 DIAGNOSIS — Z79891 Long term (current) use of opiate analgesic: Secondary | ICD-10-CM | POA: Diagnosis not present

## 2016-10-22 DIAGNOSIS — G35 Multiple sclerosis: Secondary | ICD-10-CM | POA: Diagnosis not present

## 2016-11-05 DIAGNOSIS — Z23 Encounter for immunization: Secondary | ICD-10-CM | POA: Diagnosis not present

## 2016-11-19 DIAGNOSIS — G35 Multiple sclerosis: Secondary | ICD-10-CM | POA: Diagnosis not present

## 2016-12-17 ENCOUNTER — Ambulatory Visit: Payer: Medicare Other | Admitting: Neurology

## 2016-12-17 DIAGNOSIS — G35 Multiple sclerosis: Secondary | ICD-10-CM | POA: Diagnosis not present

## 2016-12-24 ENCOUNTER — Encounter: Payer: Self-pay | Admitting: Neurology

## 2016-12-24 ENCOUNTER — Encounter: Payer: Self-pay | Admitting: *Deleted

## 2016-12-24 ENCOUNTER — Telehealth: Payer: Self-pay | Admitting: Neurology

## 2016-12-24 ENCOUNTER — Ambulatory Visit (INDEPENDENT_AMBULATORY_CARE_PROVIDER_SITE_OTHER): Payer: Medicare Other | Admitting: Neurology

## 2016-12-24 VITALS — BP 132/83 | Ht 61.0 in | Wt 115.0 lb

## 2016-12-24 DIAGNOSIS — G8250 Quadriplegia, unspecified: Secondary | ICD-10-CM

## 2016-12-24 DIAGNOSIS — G35 Multiple sclerosis: Secondary | ICD-10-CM

## 2016-12-24 DIAGNOSIS — G8220 Paraplegia, unspecified: Secondary | ICD-10-CM

## 2016-12-24 NOTE — Progress Notes (Signed)
**  Xeomin 100 units x 4 vials, NDC 0259-1610-01, Lot [id number], Exp 12/2018, office supply.//mck,rn**

## 2016-12-24 NOTE — Progress Notes (Signed)
PATIENT: Amy Bray DOB: 23-Jun-1945    HISTORICAL  Amy Bray is a 71 years old right-handed female , seen in refer by my colleague Dr. Leta Baptist for evaluation of EMG guided botulism toxin injection for bilateral lower extremity spasticity, gait abnormality, initial evaluation was on September 11 2016.   This is the first time I have seen this patient, I also performed electrical stimulation guided xeomin injection for spastic paraplegia, I used 400 units of xeomin on September 11 2016  She presented with right leg stiffness, spasm, weakness in 2002, was diagnosed with cervical spine disease, had anterior cervical vasectomy and fusion in 2003. Following the surgery, her right leg weakness and spasms significantly improved, however a year later the symptoms returned, she ultimately had second cervical spine surgery in 2006, following second surgery, she only has mild improvement of her symptoms,  Over the years, she continue have progressive worsening spastic bilateral lower extremity, gait abnormality, eventually leading to this diagnosis of relapsing remitting multiple sclerosis,  She was treated with D[facility]ane Dee over the past few years, her symptoms has been steady.  We have personally reviewed most recent MRI cervical spine in September 2017, 3 small T2 hyper intensity foci within the spinal cord, adjacent to C5-6 to the left, adjacent to C6-7 to the right, and adjacent to T2, no change compared to previous scan in 2015, evidence of surgical fusion from C3-C6.  MRI of brain showed few scattered T2/FLAIR hyperintensity foci in the subcortical deep matter of the frontal area,  Since Tysarbri infusion, her symptoms overall has been steady, but over the years, she noticed increased bilateral lower extremity muscle spasticity, especially at nighttime, she described her left joint worsens stomach, jerking movement, while ambulating, she noticed bilateral knees rubbing towards each other,  bilateral leg tends to "worse each other. She rely on her walker now.  Previously she had EMG guided Botox injection at [facility] for bilateral lower extremity spasticity  with very good result  UPDATE Dec 5th 2018: Responded very well to previous injection September 11, 2016, there was no significant side effect noticed, we used 400 units of xeomin, today she wants to emphasize on left lower extremity spasm, hip flexion,  REVIEW OF SYSTEMS: Full 14 system review of systems performed and notable only for gait abnormality, back pain   ALLERGIES: No Known Allergies  HOME MEDICATIONS: Current Outpatient Medications  Medication Sig Dispense Refill  . acetaminophen (TYLENOL) 500 MG tablet Take 500 mg by mouth as needed.    . baclofen (LIORESAL) 10 MG tablet Take 2 tablets (20 mg total) by mouth 3 (three) times daily. 180 tablet 12  . Cranberry-Vitamin C-Vitamin E (CRANBERRY PLUS VITAMIN C) 4200-20-3 MG-MG-UNIT CAPS Take by mouth.    . dalfampridine 10 MG TB12 Take 1 tablet (10 mg total) by mouth 2 (two) times daily. 180 tablet 4  . denosumab (PROLIA) 60 MG/ML SOLN injection Inject 60 mg into the skin every 6 (six) months. Administer in upper arm, thigh, or abdomen    . lisinopril (PRINIVIL,ZESTRIL) 10 MG tablet Take 1 tablet by mouth daily.    Marland Kitchen MYRBETRIQ 50 MG TB24 tablet 50 mg.    . natalizumab (TYSABRI) 300 MG/15ML injection Inject into the vein.    Marland Kitchen nortriptyline (PAMELOR) 50 MG capsule Take 1 capsule by mouth daily.    . traMADol-acetaminophen (ULTRACET) 37.5-325 MG per tablet Take 1 tablet by mouth daily as needed.     Current Facility-Administered Medications  Medication Dose Route  Frequency Provider Last Rate Last Dose  . incobotulinumtoxinA (XEOMIN) 100 units injection 400 Units  400 Units Intramuscular Q90 days Marcial Pacas, MD   400 Units at 09/11/16 1552    PAST MEDICAL HISTORY: Past Medical History:  Diagnosis Date  . Cervical myelopathy ([place])   . Multiple sclerosis ([place])   .  Osteoporosis 2018   hip    PAST SURGICAL HISTORY: Past Surgical History:  Procedure Laterality Date  . CERVICAL FUSION  01/2001, 01/2004   C3-C6    FAMILY HISTORY: Family History  Problem Relation Age of Onset  . Transient ischemic attack Mother   . Pancreatic cancer Father   . Breast cancer Cousin     SOCIAL HISTORY:  Social History   Socioeconomic History  . Marital status: Married    Spouse name: Cherice Glennie  . Number of children: 0  . Years of education: College  . Highest education level: Not on file  Social Needs  . Financial resource strain: Not on file  . Food insecurity - worry: Not on file  . Food insecurity - inability: Not on file  . Transportation needs - medical: Not on file  . Transportation needs - non-medical: Not on file  Occupational History    Employer: OTHER    Comment: n/a  Tobacco Use  . Smoking status: Former Smoker    Packs/day: 1.00    Years: 15.00    Pack years: 15.00    Types: Cigarettes    Last attempt to quit: 01/21/1983    Years since quitting: 33.9  . Smokeless tobacco: Never Used  Substance and Sexual Activity  . Alcohol use: Yes    Alcohol/week: 0.0 oz    Comment: occasionally- wine  . Drug use: No  . Sexual activity: Not on file  Other Topics Concern  . Not on file  Social History Narrative   Patient lives at home with her spouse.   Caffeine Use-none     PHYSICAL EXAM   Vitals:   12/24/16 1343  BP: 132/83  Weight: 115 lb (52.2 kg)  Height: 5\' 1"  (1.549 m)    Not recorded      Body mass index is 21.73 kg/m.  PHYSICAL EXAMNIATION:  Gen: NAD, conversant, well nourised, obese, well groomed                     Cardiovascular: Regular rate rhythm, no peripheral edema, warm, nontender. Eyes: Conjunctivae clear without exudates or hemorrhage Neck: Supple, no carotid bruits. Pulmonary: Clear to auscultation bilaterally   NEUROLOGICAL EXAM:  MENTAL STATUS: Speech:    Speech is normal; fluent and spontaneous  with normal comprehension.  Cognition:     Orientation to time, place and person     Normal recent and remote memory     Normal Attention span and concentration     Normal Language, naming, repeating,spontaneous speech     Fund of knowledge   CRANIAL NERVES: CN II: Visual fields are full to confrontation. Fundoscopic exam is normal with sharp discs and no vascular changes. Pupils are round equal and briskly reactive to light. CN III, IV, VI: extraocular movement are normal. No ptosis. CN V: Facial sensation is intact to pinprick in all 3 divisions bilaterally. Corneal responses are intact.  CN VII: Face is symmetric with normal eye closure and smile. CN VIII: Hearing is normal to rubbing fingers CN IX, X: Palate elevates symmetrically. Phonation is normal. CN XI: Head turning and shoulder shrug are intact CN  XII: Tongue is midline with normal movements and no atrophy.  MOTOR: She has moderate spasticity of bilateral lower extremity, mild to moderate proximal lower extremity weakness,   REFLEXES: Reflexes are 2+ and symmetric at the biceps, triceps, knees, and ankles. Plantar responses Extensor bilaterally  Sensory: Intact to light touch  COORDINATION: Rapid alternating movements and fine finger movements are intact. There is no dysmetria on finger-to-nose and heel-knee-shin.    GAIT/STANCE: She rely on her walker to get up from seated position, significant bilateral adductor muscle spasm, rubbing her knees together while walking, spastic, unsteady slow cautious gait, bilateral flat feet, difficulty to clear bilateral feet from the floor DIAGNOSTIC DATA (LABS, IMAGING, TESTING)  - I reviewed patient records, labs, notes, testing and imaging myself where available.   ASSESSMENT AND PLAN  Amy Bray is a 71 y.o. female   Spastic paraparesis.  Relapsing remitting multiple sclerosis with cervical cord involvement, history of spondylitic cervical myelopathy   Under electric  stimulation we have performed xeomin injection today use 400 units  Right adductor longus 100 Right adductor magnus 50 units Right extensor hallucis longus 25 units  Left adductor longus 125 units Left extensor hallucis longus 25 units Left adductor magnus 75 units  Patient tolerate the injection well, returned to 3 months for repeat injection   Marcial Pacas, M.D. Ph.D.  [facility] [address], [place], [place] [address] Ph: [phone] Fax: [phone]  CC: Hulan Fess, MD

## 2016-12-24 NOTE — Telephone Encounter (Signed)
If her insurance allowed, please get xeomin 600 units for next injection in 3 months

## 2016-12-25 DIAGNOSIS — I10 Essential (primary) hypertension: Secondary | ICD-10-CM | POA: Diagnosis not present

## 2016-12-25 DIAGNOSIS — M899 Disorder of bone, unspecified: Secondary | ICD-10-CM | POA: Diagnosis not present

## 2016-12-25 DIAGNOSIS — E559 Vitamin D deficiency, unspecified: Secondary | ICD-10-CM | POA: Diagnosis not present

## 2016-12-31 DIAGNOSIS — M810 Age-related osteoporosis without current pathological fracture: Secondary | ICD-10-CM | POA: Diagnosis not present

## 2016-12-31 DIAGNOSIS — M899 Disorder of bone, unspecified: Secondary | ICD-10-CM | POA: Diagnosis not present

## 2016-12-31 DIAGNOSIS — R7301 Impaired fasting glucose: Secondary | ICD-10-CM | POA: Diagnosis not present

## 2016-12-31 DIAGNOSIS — G35 Multiple sclerosis: Secondary | ICD-10-CM | POA: Diagnosis not present

## 2016-12-31 DIAGNOSIS — D649 Anemia, unspecified: Secondary | ICD-10-CM | POA: Diagnosis not present

## 2016-12-31 DIAGNOSIS — R8290 Unspecified abnormal findings in urine: Secondary | ICD-10-CM | POA: Diagnosis not present

## 2016-12-31 DIAGNOSIS — Z23 Encounter for immunization: Secondary | ICD-10-CM | POA: Diagnosis not present

## 2016-12-31 DIAGNOSIS — Z8371 Family history of colonic polyps: Secondary | ICD-10-CM | POA: Diagnosis not present

## 2016-12-31 DIAGNOSIS — H6122 Impacted cerumen, left ear: Secondary | ICD-10-CM | POA: Diagnosis not present

## 2016-12-31 DIAGNOSIS — G8929 Other chronic pain: Secondary | ICD-10-CM | POA: Diagnosis not present

## 2016-12-31 DIAGNOSIS — Z0000 Encounter for general adult medical examination without abnormal findings: Secondary | ICD-10-CM | POA: Diagnosis not present

## 2016-12-31 DIAGNOSIS — I10 Essential (primary) hypertension: Secondary | ICD-10-CM | POA: Diagnosis not present

## 2017-01-01 NOTE — Telephone Encounter (Signed)
Noted  

## 2017-01-05 DIAGNOSIS — G894 Chronic pain syndrome: Secondary | ICD-10-CM | POA: Diagnosis not present

## 2017-01-05 DIAGNOSIS — M47817 Spondylosis without myelopathy or radiculopathy, lumbosacral region: Secondary | ICD-10-CM | POA: Diagnosis not present

## 2017-01-05 DIAGNOSIS — M5137 Other intervertebral disc degeneration, lumbosacral region: Secondary | ICD-10-CM | POA: Diagnosis not present

## 2017-01-05 DIAGNOSIS — Z79899 Other long term (current) drug therapy: Secondary | ICD-10-CM | POA: Diagnosis not present

## 2017-01-05 DIAGNOSIS — Z79891 Long term (current) use of opiate analgesic: Secondary | ICD-10-CM | POA: Diagnosis not present

## 2017-01-05 DIAGNOSIS — M533 Sacrococcygeal disorders, not elsewhere classified: Secondary | ICD-10-CM | POA: Diagnosis not present

## 2017-01-14 DIAGNOSIS — G35 Multiple sclerosis: Secondary | ICD-10-CM | POA: Diagnosis not present

## 2017-01-26 ENCOUNTER — Other Ambulatory Visit: Payer: Self-pay | Admitting: Diagnostic Neuroimaging

## 2017-01-26 MED ORDER — DALFAMPRIDINE ER 10 MG PO TB12: 10.0000 mg | ORAL_TABLET | Freq: Two times a day (BID) | ORAL | 4 refills | Status: DC

## 2017-01-26 NOTE — Telephone Encounter (Signed)
I spoke to pt and she is wanting to start the amypra again since now is generic.  She stated it would be BN cost $2000 vs Mail order or local pharmacy less.  She gave the [facility] specialty # to call. [phone] I called this number and it was not [employer]. (wanted me to take a survey?).  Pt stated that she has not taken the ampyra for 1.5 yrs due to cost.  I  called [employer] specialty and they need new prescription for the generic of amypra.  Pt last seen 08-11-16 and walking test results.  Has appt 02-11-17 with Dr. Leta Baptist.

## 2017-01-26 NOTE — Telephone Encounter (Signed)
Pt is wanting a call regarding a new generic medication. Pt didn't want to discuss in detail with me.

## 2017-01-27 MED ORDER — DALFAMPRIDINE ER 10 MG PO TB12: 10.0000 mg | ORAL_TABLET | Freq: Two times a day (BID) | ORAL | 4 refills | Status: DC

## 2017-01-27 NOTE — Addendum Note (Signed)
Addended by: Brandon Melnick on: 01/27/2017 10:28 AM   Modules accepted: Orders

## 2017-01-27 NOTE — Telephone Encounter (Addendum)
LMVM for pt that ok to renew the generic amypra now.  Will renew to [place] per pt request and cancel [facility].  Pt to call back if needed.   Spoke to [place] at [place] and cancelled.

## 2017-01-29 ENCOUNTER — Telehealth: Payer: Self-pay | Admitting: *Deleted

## 2017-01-29 NOTE — Telephone Encounter (Signed)
Pt is requesting a call back. 

## 2017-01-29 NOTE — Telephone Encounter (Signed)
Pt calling about the generic ampyra.  Receipt confirmed 01-27-2017 received at [place] for prescription.  Pt had not heard anything yet.  She will call and let us know if problem.

## 2017-02-05 DIAGNOSIS — M810 Age-related osteoporosis without current pathological fracture: Secondary | ICD-10-CM | POA: Diagnosis not present

## 2017-02-11 ENCOUNTER — Ambulatory Visit (INDEPENDENT_AMBULATORY_CARE_PROVIDER_SITE_OTHER): Payer: Medicare Other | Admitting: Diagnostic Neuroimaging

## 2017-02-11 ENCOUNTER — Encounter: Payer: Self-pay | Admitting: Diagnostic Neuroimaging

## 2017-02-11 VITALS — BP 120/71 | HR 100 | Ht 61.0 in | Wt 112.2 lb

## 2017-02-11 DIAGNOSIS — G35 Multiple sclerosis: Secondary | ICD-10-CM

## 2017-02-11 DIAGNOSIS — R252 Cramp and spasm: Secondary | ICD-10-CM

## 2017-02-11 DIAGNOSIS — G959 Disease of spinal cord, unspecified: Secondary | ICD-10-CM

## 2017-02-11 MED ORDER — DALFAMPRIDINE ER 10 MG PO TB12: 10.0000 mg | ORAL_TABLET | Freq: Two times a day (BID) | ORAL | 4 refills | Status: DC

## 2017-02-11 MED ORDER — BACLOFEN 10 MG PO TABS: 20.0000 mg | ORAL_TABLET | Freq: Three times a day (TID) | ORAL | 12 refills | Status: DC

## 2017-02-11 NOTE — Progress Notes (Signed)
[facility]  PATIENT: Amy Bray DOB: [birth year]  REFERRING CLINICIAN:  HISTORY FROM: patient and husband  REASON FOR VISIT: follow up   HISTORICAL  CHIEF COMPLAINT:  Chief Complaint  Patient presents with  . Follow-up  . Multiple Sclerosis    Taking generic ampyra now for 2wks notes a difference. Otherwise ok, has intermittent R leg swelling.     HISTORY OF PRESENT ILLNESS:   UPDATE (02/11/17, VRP): Since last visit, doing well. Tolerating meds. No alleviating or aggravating factors. Now on xeomin (Aug and Dec 2018) and spasticity improved. On generic ampyra and doing well.   UPDATE 08/11/16: Since last visit, doing well. Tolerating tysabri and baclofen. Heat has been challenging with spasms. Gait diff with knees rubbing together, slightly worse.  UPDATE 02/11/16: Since last visit, balance diff continues. No flares. MRIs stable. Tolerating tysabri.   UPDATE 10/08/15: Since last visit, pt feels some progression of gait diff and walking problems. No specific attack or flare up.  UPDATE 07/04/15: Since last visit, doing well. Tolerating tysabri and ampyra.  UPDATE 04/02/15: Since last visit, doing well. Tolerating tysabri and ampyra. Back on nortriptyline (is helping burning pain in feet).   UPDATE 01/03/15: Since last visit, doing better on ampyra (better gait and walking speed). More foot/calf tingling burning pain x 1-2 months. Now with Dr. Andree Elk pain mgmt for back injections x 2, which is helping.  UPDATE 10/02/14: Since last visit, doing well. Requesting transition of care to [facility] for pain mgmt; previously at [facility] with Dr. Oliva Bustard.  UPDATE 05/22/14: Since last visit, doing well. On tysabri since March 2016. Fatigue and bladder control much improved. Gait is stable.   UPDATE 02/20/14: Since last visit, had reviewed lab results and now ready to start MS medication. Now would like to start tysabri.  UPDATE 01/09/14: Since last visit, no new events  symptoms. LP results reviewed. Didn't get lab work at last visit for MS mimics.   UPDATE 12/07/13: Patient is stable with symptoms. MRI scans reviewed. Continues to have weakness in legs and balance difficulty.  UPDATE 11/21/13: 72 year old right-handed female here for evaluation of lower extremity spasms and weakness. 2002 patient developed onset of right leg stiffness, spasm, weakness. Patient was diagnosed with cervical spine disease, bone spur ridging, and treated with anterior cervical discectomy and fusion in 2003. Following this her right leg weakness and spasms significantly improved. However one year later her symptoms return. Patient followed up with several surgeons and ultimately had a second cervical spine surgery in 2006. Following this patient had slight improvement of right leg however within several months her weakness, spasms and gait continued to decline. Patient transitioned needing a cane to walk. 2013 patient was evaluated by a neurologist in [place], to evaluate her right leg weakness. Apparently patient had a "multiple sclerosis" workup including MRI of the brain and extensive blood testing. Patient also had EMG nerve conduction studies looking for peroneal neuropathy but apparently all studies were normal. MRI of the brain did show a right medulla lesion which was reported as a stroke. Over the past 1 year patient has developed new onset left leg weakness, spasm, stiffness. Patient followed up with orthopedic surgery who then referred patient to me for evaluation. Last MRI of cervical spine was in 2006 at [facility]. No bowel or bladder incontinence. No significant numbness or tingling. No weakness in upper extremities. No vision loss, vision changes, slurred speech or trouble talking.   REVIEW OF SYSTEMS: Full 14 system  review of systems performed and negative except: muscle cramps restless legs.    ALLERGIES: No Known Allergies  HOME MEDICATIONS: Outpatient Medications Prior  to Visit  Medication Sig Dispense Refill  . acetaminophen (TYLENOL) 500 MG tablet Take 500 mg by mouth as needed.    . baclofen (LIORESAL) 10 MG tablet Take 2 tablets (20 mg total) by mouth 3 (three) times daily. 180 tablet 12  . Cranberry-Vitamin C-Vitamin E (CRANBERRY PLUS VITAMIN C) 4200-20-3 MG-MG-UNIT CAPS Take by mouth.    . dalfampridine 10 MG TB12 Take 1 tablet (10 mg total) by mouth 2 (two) times daily. 180 tablet 4  . denosumab (PROLIA) 60 MG/ML SOLN injection Inject 60 mg into the skin every 6 (six) months. Administer in upper arm, thigh, or abdomen    . lisinopril (PRINIVIL,ZESTRIL) 10 MG tablet Take 1 tablet by mouth daily.    Marland Kitchen MYRBETRIQ 50 MG TB24 tablet 50 mg.    . natalizumab (TYSABRI) 300 MG/15ML injection Inject into the vein.    Marland Kitchen nortriptyline (PAMELOR) 50 MG capsule Take 1 capsule by mouth daily.    . traMADol-acetaminophen (ULTRACET) 37.5-325 MG per tablet Take 1 tablet by mouth daily as needed.     Facility-Administered Medications Prior to Visit  Medication Dose Route Frequency Provider Last Rate Last Dose  . incobotulinumtoxinA (XEOMIN) 100 units injection 400 Units  400 Units Intramuscular Q90 days Marcial Pacas, MD   400 Units at 09/11/16 1552    PAST MEDICAL HISTORY: Past Medical History:  Diagnosis Date  . Cervical myelopathy ([place])   . Multiple sclerosis ([place])   . Osteoporosis 2018   hip    PAST SURGICAL HISTORY: Past Surgical History:  Procedure Laterality Date  . CERVICAL FUSION  01/2001, 01/2004   C3-C6    FAMILY HISTORY: Family History  Problem Relation Age of Onset  . Transient ischemic attack Mother   . Pancreatic cancer Father   . Breast cancer Cousin     SOCIAL HISTORY:  Social History   Socioeconomic History  . Marital status: Married    Spouse name: Shaneika Rossa  . Number of children: 0  . Years of education: College  . Highest education level: Not on file  Social Needs  . Financial resource strain: Not on file  . Food  insecurity - worry: Not on file  . Food insecurity - inability: Not on file  . Transportation needs - medical: Not on file  . Transportation needs - non-medical: Not on file  Occupational History    Employer: OTHER    Comment: n/a  Tobacco Use  . Smoking status: Former Smoker    Packs/day: 1.00    Years: 15.00    Pack years: 15.00    Types: Cigarettes    Last attempt to quit: 01/21/1983    Years since quitting: 34.0  . Smokeless tobacco: Never Used  Substance and Sexual Activity  . Alcohol use: Yes    Alcohol/week: 0.0 oz    Comment: occasionally- wine  . Drug use: No  . Sexual activity: Not on file  Other Topics Concern  . Not on file  Social History Narrative   Patient lives at home with her spouse.   Caffeine Use-none     PHYSICAL EXAM  Vitals:   02/11/17 1249  BP: 120/71  Pulse: 100  Weight: 112 lb 3.2 oz (50.9 kg)  Height: 5\' 1"  (1.549 m)    Not recorded      Visual Acuity Screening   Right eye  Left eye Both eyes  Without correction:     With correction: 20/40 20/30      Body mass index is 21.2 kg/m.  GENERAL EXAM: Patient is in no distress; well developed, nourished and groomed; neck is supple  CARDIOVASCULAR: Regular rate and rhythm, no murmurs, no carotid bruits  NEUROLOGIC: MENTAL STATUS: awake, alert, language fluent, comprehension intact, naming intact, fund of knowledge appropriate CRANIAL NERVE: pupils equal and reactive to light, visual fields full to confrontation, extraocular muscles intact, no nystagmus, facial sensation and strength symmetric, hearing intact, palate elevates symmetrically, uvula midline, shoulder shrug symmetric, tongue midline. MOTOR: normal bulk and tone in BUE; MILD INCREASED EXT TONE IN BLE; BLE (HF 3-4, KE 5 KF 4 DF 4) SENSORY: DECR VIB AT TOES  COORDINATION: finger-nose-finger, fine finger movements --> MINIMAL ACTION TREMOR IN BUE REFLEXES: BUE 3; BLE (KNEES 3, ANKLES 2) GAIT/STATION: SPASTIC, PARAPARETIC GAIT;  USES ROLLATOR WALKER      DIAGNOSTIC DATA (LABS, IMAGING, TESTING) - I reviewed patient records, labs, notes, testing and imaging myself where available.  Lab Results  Component Value Date   WBC 8.7 08/11/2016   HGB 11.8 08/11/2016   HCT 35.9 08/11/2016   MCV 97 08/11/2016   PLT 326 08/11/2016      Component Value Date/Time   NA 141 08/11/2016 1623   K 4.5 08/11/2016 1623   CL 99 08/11/2016 1623   CO2 25 08/11/2016 1623   GLUCOSE 81 08/11/2016 1623   BUN 16 08/11/2016 1623   CREATININE 0.60 08/11/2016 1623   CALCIUM 9.9 08/11/2016 1623   PROT 6.7 08/11/2016 1623   ALBUMIN 4.6 08/11/2016 1623   AST 18 08/11/2016 1623   ALT 11 08/11/2016 1623   ALKPHOS 31 (L) 08/11/2016 1623   BILITOT 0.3 08/11/2016 1623   GFRNONAA 93 08/11/2016 1623   GFRAA 108 08/11/2016 1623   No results found for: CHOL, HDL, LDLCALC, LDLDIRECT, TRIG, CHOLHDL No results found for: HGBA1C No results found for: VITAMINB12 No results found for: TSH  Vit D, 25-Hydroxy  Date Value Ref Range Status  01/03/2015 36.8 30.0 - 100.0 ng/mL Final    Comment:    Vitamin D deficiency has been defined by the Institute of Medicine and an Endocrine Society practice guideline as a level of serum 25-OH vitamin D less than 20 ng/mL (1,2). The Endocrine Society went on to further define vitamin D insufficiency as a level between 21 and 29 ng/mL (2). 1. IOM (Institute of Medicine). 2010. Dietary reference    intakes for calcium and D. [place]: The    [employer]. 2. Holick MF, Binkley [place], Bischoff-Ferrari HA, et al.    Evaluation, treatment, and prevention of vitamin D    deficiency: an Endocrine Society clinical practice    guideline. JCEM. 2011 Jul; 96(7):1911-30.     I reviewed images myself. In my review, I think there is a 2cm spinal cord lesion (right side, C6-C7) and a smaller left T1 lesion, suspicious for demyelinating disease, on the MRI cervical spine from 10/08/03. No adjacent disc  or bone degeneration or spinal stenosis. The MRI report states that the cord appears normal. -VRP  10/08/03 MRI cervical spine (images reviewed) - Satisfactory anterior plate fusion of C4 and C5.Disk degeneration and spondylosis at C3-4 and C5-6 with biforaminal narrowing but no significant central canal stenosis. There is no disk protrusion.   11/06/11 MRI brain (report only) - "subtle area of increased T2 intensity in the right posterior upper medulla likely  representing old infarct or possibly area of previous inflammation. Otherwise normal MRI of the brain."  12/04/13 MRI brain (with and without) demonstrating: 1. Few punctate foci of right frontal subcortical and periventricular non-specific gliosis. These findings are non-specific and considerations include autoimmune, inflammatory, post-infectious, microvascular ischemic or migraine associated etiologies.  2. No abnormal enhancing lesions. No acute findings.  12/04/13 MRI cervical spine (with and without) demonstrating: 1. The spinal cord is notable for 3 subtle T2 hyperintense lesions: C5-6 on the left, C7 on the right, T2 slightly on the right of midline. No abnormal enhancing lesions. Considerations include autoimmune, inflammatory or post-infectious etiologies. No adjacent disc disease to suggest mechanical compressive etiologies.  2. At C6-7: left uncovertebral joint hypertrophy with moderate left foraminal stenosis. 3. Compared to prior MRI on 10/08/03, the C5-6 lesion appears new; the C7 and T2 spinal cord lesions appear stable. Also, there has been change in ACDF levels (previously from C4-C5; now C3-C6).   12/04/13 MRI thoracic spine (with and without) - unremarkable  12/29/13 LP - WBC 1, RBC 0, protein 26, glucose 59, OCB > 5, gram stain neg  09/28/14 MRI brain [I reviewed images myself and agree with interpretation. -VRP]  1. There is a small T2/FLAIR hyperintense focus in the right frontal lobe. This is nonspecific and could  be idiopathic or represent a small focus of chronic microvascular ischemic change or demyelination.. The extent is within a normal range for her age. 2. There are no enhancing lesions or acute findings 3. Compared to the MRI dated 12/04/2013, there is no definite interval change.  04/11/15 MRI brain (with and without) demonstrating: 1. Subtle right and left frontal subcortical foci of non-specific gliosis. No abnormal lesions are seen on post contrast views. These findings are non-specific and considerations include autoimmune, inflammatory, post-infectious, microvascular ischemic or migraine associated etiologies.  2. No acute findings. 3. No change from MRI on 09/28/14.   10/19/15 MRI cervical spine with and without contrast showing the following: 1.   There are 3 small T2 hyperintense foci within the spinal cord, adjacent to C5-C6 to the left, adjacent to C6-C7 to the right and adjacent to T2. These foci were also noted on the 12/04/2013 MRI and appear unchanged. 2.   Surgical fusion from C3-C6. This appears unchanged. 3.   Mild degenerative changes at C2-C3 and C6-C7 that do not lead to any nerve root compression. 4.   There is a normal enhancement pattern. There are no acute findings.  10/19/15 MRI thoracic spine with and without contrast shows the following: 1.    T2 hyperintense foci adjacent to C7-T1 to the right and adjacent to T2 to the right. Neither of these appear to be acute.  They are present on the 2015 MRIs as well. 2.    There are no significant degenerative changes. 3.    There is a normal enhancement pattern.  10/29/15 MRI brain  1.   A few scattered T2/FLAIR hyperintense foci in the subcortical deep white matter of the frontal lobes. This is a nonspecific finding and could be idiopathic or due to chronic microvascular ischemic changes, demyelination or trauma. None of the foci appears to be acute and there is no change when compared to the MRI dated 04/11/2015. 2.   There  are no acute findings.  08/24/16 MRI brain (with and without) demonstrating:  1. Scattered T2/flair hyperintense foci in the subcortical and deep white matter of both hemispheres. This is a nonspecific finding and could be seen with  chronic microvascular ischemic change or be due to demyelination or sequela of prior inflammation or trauma. None of these appear to be acute and there are no new lesions when compared to the 10/29/2015 MRI.  2. Mild cortical atrophy. 3. There are no acute findings and there is a normal enhancement pattern.  Anti-JCV antibody 01/10/14 - 0.10 negative  05/22/14 - 0.09 negative 12/2014 - negative March 2017 - negative 10/09/15 - 0.13 negative 02/11/16 JCV antibody 0.11 negative 08-13-16 JCV antibody 0.11 negative sy    ASSESSMENT AND PLAN  72 y.o. year old female here with  bilateral lower extremity spasticity, weakness,with brisk reflexes in upper and lower extremities, left Hoffman sign, bilateral upgoing toes. MRIs from 2005 and 2015 demonstrate multiple spinal cord lesions. Now suspect demyelinating disease, with first attack in 2002, second attack in 2003, and 3rd attack in 2014.  Then with some progression of symptoms since mid-2017.   Dx: multiple sclerosis   Spasticity  Multiple sclerosis ([place]), Chronic  Cervical myelopathy ([place])    PLAN:  MULTIPLE SCLEROSIS  - continue tysabri - check labs today - continue generic dalfampridine (ampyra); walking speed and balance has improved; (in the past T25W: 46s --> 25s)  MUSCLE SPASMS - continue baclofen for spasticity - continue xeomin / botox injection for MS spasticity (per Dr. [name])  Meds ordered this encounter  Medications  . dalfampridine 10 MG TB12    Sig: Take 1 tablet (10 mg total) by mouth 2 (two) times daily.    Dispense:  180 tablet    Refill:  4  . baclofen (LIORESAL) 10 MG tablet    Sig: Take 2 tablets (20 mg total) by mouth 3 (three) times daily.    Dispense:  180 tablet    Refill:   12   Orders Placed This Encounter  Procedures  . CBC with Differential/Platelet  . Comprehensive metabolic panel  . Stratify JCV Ab (w/ Index) w/ Rflx   Return in about 6 months (around 08/11/2017).     Penni Bombard, MD 0/10/9321, 5:57 PM Certified in Neurology, Neurophysiology and Neuroimaging  [facility] [address] [place], [place] [address] [phone]

## 2017-02-12 LAB — CBC WITH DIFFERENTIAL/PLATELET
Basophils Absolute: 0 10*3/uL (ref 0.0–0.2)
Basos: 1 %
EOS (ABSOLUTE): 0.1 10*3/uL (ref 0.0–0.4)
Eos: 2 %
Hematocrit: 36.8 % (ref 34.0–46.6)
Hemoglobin: 12.5 g/dL (ref 11.1–15.9)
Immature Grans (Abs): 0 10*3/uL (ref 0.0–0.1)
Immature Granulocytes: 1 %
Lymphocytes Absolute: 2.1 10*3/uL (ref 0.7–3.1)
Lymphs: 34 %
MCH: 31.4 pg (ref 26.6–33.0)
MCHC: 34 g/dL (ref 31.5–35.7)
MCV: 93 fL (ref 79–97)
Monocytes Absolute: 0.7 10*3/uL (ref 0.1–0.9)
Monocytes: 11 %
Neutrophils Absolute: 3.3 10*3/uL (ref 1.4–7.0)
Neutrophils: 51 %
Platelets: 360 10*3/uL (ref 150–379)
RBC: 3.98 x10E6/uL (ref 3.77–5.28)
RDW: 14.3 % (ref 12.3–15.4)
WBC: 6.3 10*3/uL (ref 3.4–10.8)

## 2017-02-12 LAB — COMPREHENSIVE METABOLIC PANEL
ALT: 14 IU/L (ref 0–32)
AST: 14 IU/L (ref 0–40)
Albumin/Globulin Ratio: 2.2 (ref 1.2–2.2)
Albumin: 4.4 g/dL (ref 3.5–4.8)
Alkaline Phosphatase: 32 IU/L — ABNORMAL LOW (ref 39–117)
BUN/Creatinine Ratio: 26 (ref 12–28)
BUN: 21 mg/dL (ref 8–27)
Bilirubin Total: <0.2 mg/dL (ref 0.0–1.2)
CO2: 27 mmol/L (ref 20–29)
Calcium: 9.5 mg/dL (ref 8.7–10.3)
Chloride: 106 mmol/L (ref 96–106)
Creatinine, Ser: 0.81 mg/dL (ref 0.57–1.00)
GFR calc Af Amer: 85 mL/min/{1.73_m2} (ref >59)
GFR calc non Af Amer: 74 mL/min/{1.73_m2} (ref >59)
Globulin, Total: 2 g/dL (ref 1.5–4.5)
Glucose: 105 mg/dL — ABNORMAL HIGH (ref 65–99)
Potassium: 4.3 mmol/L (ref 3.5–5.2)
Sodium: 147 mmol/L — ABNORMAL HIGH (ref 134–144)
Total Protein: 6.4 g/dL (ref 6.0–8.5)

## 2017-02-16 DIAGNOSIS — G35 Multiple sclerosis: Secondary | ICD-10-CM | POA: Diagnosis not present

## 2017-02-16 LAB — STRATIFY JCV AB (W/ INDEX) W/ RFLX
Index Value: 0.07
Stratify JCV (TM) Ab w/Reflex Inhibition: NEGATIVE

## 2017-02-24 ENCOUNTER — Telehealth: Payer: Self-pay | Admitting: *Deleted

## 2017-02-24 NOTE — Telephone Encounter (Signed)
-----   Message from Penni Bombard, MD sent at 02/19/2017  6:01 PM EST ----- Normal labs. Please call patient. -VRP

## 2017-02-24 NOTE — Telephone Encounter (Signed)
I spoke to pt and relayed that her lab results were normal. She verbalized understanding.

## 2017-03-05 ENCOUNTER — Telehealth: Payer: Self-pay | Admitting: *Deleted

## 2017-03-05 NOTE — Telephone Encounter (Signed)
LMVM for pt on home # that JCV antibody was negative.  She is to call back if questions.

## 2017-03-05 NOTE — Telephone Encounter (Signed)
Received and on Dr. Gladstone Lighter desk for review.

## 2017-03-05 NOTE — Telephone Encounter (Signed)
Called home # did not LM, Mobile # not able to LM.

## 2017-03-05 NOTE — Telephone Encounter (Signed)
JCV negative. -VRP

## 2017-03-05 NOTE — Telephone Encounter (Signed)
Requested JCV results for pt on [facility] website.

## 2017-03-13 DIAGNOSIS — G35 Multiple sclerosis: Secondary | ICD-10-CM | POA: Diagnosis not present

## 2017-03-17 ENCOUNTER — Telehealth: Payer: Self-pay | Admitting: Diagnostic Neuroimaging

## 2017-03-17 NOTE — Telephone Encounter (Signed)
error 

## 2017-03-17 NOTE — Telephone Encounter (Signed)
Received fax from [facility] [employer] program. Patient is authorized to continue to receive Tysabri through 10/11/17.

## 2017-03-18 NOTE — Telephone Encounter (Signed)
Returned call to [place] with [facility] regarding Tysabri re: case # [id number]. She was checking back on [employer] [facility] form which had checked that patient was deceased. This RN explained that was in error and that [facility] had been informed of error. Also advised her this RN received fax from [place] stating patient is authorized to continue to receive Tysabri thru 10/11/17. She stated that she would correct it and label as 'form error'. She verbalized understanding, appreciation for call back.

## 2017-03-18 NOTE — Telephone Encounter (Signed)
Benzena with [employer] calling wanting to speak with RN regarding Tysabri. Call back # [phone] case # [id number]

## 2017-03-23 ENCOUNTER — Ambulatory Visit (INDEPENDENT_AMBULATORY_CARE_PROVIDER_SITE_OTHER): Payer: Medicare Other | Admitting: Neurology

## 2017-03-23 ENCOUNTER — Encounter: Payer: Self-pay | Admitting: Neurology

## 2017-03-23 VITALS — BP 103/63 | HR 94 | Ht 61.0 in | Wt 114.5 lb

## 2017-03-23 DIAGNOSIS — G35 Multiple sclerosis: Secondary | ICD-10-CM | POA: Diagnosis not present

## 2017-03-23 DIAGNOSIS — G8250 Quadriplegia, unspecified: Secondary | ICD-10-CM

## 2017-03-23 MED ORDER — INCOBOTULINUMTOXINA 100 UNITS IM SOLR
600.0000 [IU] | INTRAMUSCULAR | Status: DC
  Administered 2017-03-23: 600 [IU] via INTRAMUSCULAR

## 2017-03-23 NOTE — Progress Notes (Signed)
PATIENT: Amy Bray DOB: [birth year]    HISTORICAL  AISHIA BARKEY is a 72 years old right-handed female , seen in refer by my colleague Dr. Leta Baptist for evaluation of EMG guided botulism toxin injection for bilateral lower extremity spasticity, gait abnormality, initial evaluation was on September 11 2016.   This is the first time I have seen this patient, I also performed electrical stimulation guided xeomin injection for spastic paraplegia, I used 400 units of xeomin on September 11 2016  She presented with right leg stiffness, spasm, weakness in 2002, was diagnosed with cervical spine disease, had anterior cervical vasectomy and fusion in 2003. Following the surgery, her right leg weakness and spasms significantly improved, however a year later the symptoms returned, she ultimately had second cervical spine surgery in 2006, following second surgery, she only has mild improvement of her symptoms,  Over the years, she continue have progressive worsening spastic bilateral lower extremity, gait abnormality, eventually leading to this diagnosis of relapsing remitting multiple sclerosis,  She was treated with [facility] over the past few years, her symptoms has been steady.  We have personally reviewed most recent MRI cervical spine in September 2017, 3 small T2 hyper intensity foci within the spinal cord, adjacent to C5-6 to the left, adjacent to C6-7 to the right, and adjacent to T2, no change compared to previous scan in 2015, evidence of surgical fusion from C3-C6.  MRI of brain showed few scattered T2/FLAIR hyperintensity foci in the subcortical deep matter of the frontal area,  Since Tysarbri infusion, her symptoms overall has been steady, but over the years, she noticed increased bilateral lower extremity muscle spasticity, especially at nighttime, she described her left joint worsens stomach, jerking movement, while ambulating, she noticed bilateral knees rubbing towards each other,  bilateral leg tends to "worse each other. She rely on her walker now.  Previously she had EMG guided [facility] injection at [facility] for bilateral lower extremity spasticity  with very good result  UPDATE Dec 5th 2018: Responded very well to previous injection September 11, 2016, there was no significant side effect noticed, we used 400 units of xeomin, today she wants to emphasize on left lower extremity spasm, hip flexion,  UPDATE March 23 2017: She responded very well to previous xeomin injection 400 units to bilateral lower extremity, we used 600 units total, with emphasized on bilateral hip adduction, flexion  REVIEW OF SYSTEMS: Full 14 system review of systems performed and notable only for gait abnormality, back pain   ALLERGIES: No Known Allergies  HOME MEDICATIONS: Current Outpatient Medications  Medication Sig Dispense Refill  . acetaminophen (TYLENOL) 500 MG tablet Take 500 mg by mouth as needed.    . baclofen (LIORESAL) 10 MG tablet Take 2 tablets (20 mg total) by mouth 3 (three) times daily. 180 tablet 12  . Cranberry-Vitamin C-Vitamin E (CRANBERRY PLUS VITAMIN C) 4200-20-3 MG-MG-UNIT CAPS Take by mouth.    . dalfampridine 10 MG TB12 Take 1 tablet (10 mg total) by mouth 2 (two) times daily. 180 tablet 4  . denosumab (PROLIA) 60 MG/ML SOLN injection Inject 60 mg into the skin every 6 (six) months. Administer in upper arm, thigh, or abdomen    . lisinopril (PRINIVIL,ZESTRIL) 10 MG tablet Take 1 tablet by mouth daily.    Marland Kitchen MYRBETRIQ 50 MG TB24 tablet 50 mg.    . natalizumab (TYSABRI) 300 MG/15ML injection Inject into the vein.    Marland Kitchen nortriptyline (PAMELOR) 50 MG capsule Take 1 capsule by  mouth daily.    . traMADol-acetaminophen (ULTRACET) 37.5-325 MG per tablet Take 1 tablet by mouth daily as needed.     Current Facility-Administered Medications  Medication Dose Route Frequency Provider Last Rate Last Dose  . incobotulinumtoxinA (XEOMIN) 100 units injection 400 Units  400 Units  Intramuscular Q90 days Marcial Pacas, MD   400 Units at 09/11/16 1552    PAST MEDICAL HISTORY: Past Medical History:  Diagnosis Date  . Cervical myelopathy ([place])   . Multiple sclerosis ([place])   . Osteoporosis 2018   hip    PAST SURGICAL HISTORY: Past Surgical History:  Procedure Laterality Date  . CERVICAL FUSION  01/2001, 01/2004   C3-C6    FAMILY HISTORY: Family History  Problem Relation Age of Onset  . Transient ischemic attack Mother   . Pancreatic cancer Father   . Breast cancer Cousin     SOCIAL HISTORY:  Social History   Socioeconomic History  . Marital status: Married    Spouse name: Ysabela Keisler  . Number of children: 0  . Years of education: College  . Highest education level: Not on file  Social Needs  . Financial resource strain: Not on file  . Food insecurity - worry: Not on file  . Food insecurity - inability: Not on file  . Transportation needs - medical: Not on file  . Transportation needs - non-medical: Not on file  Occupational History    Employer: OTHER    Comment: n/a  Tobacco Use  . Smoking status: Former Smoker    Packs/day: 1.00    Years: 15.00    Pack years: 15.00    Types: Cigarettes    Last attempt to quit: 01/21/1983    Years since quitting: 34.1  . Smokeless tobacco: Never Used  Substance and Sexual Activity  . Alcohol use: Yes    Alcohol/week: 0.0 oz    Comment: occasionally- wine  . Drug use: No  . Sexual activity: Not on file  Other Topics Concern  . Not on file  Social History Narrative   Patient lives at home with her spouse.   Caffeine Use-none     PHYSICAL EXAM   Vitals:   03/23/17 1316  BP: 103/63  Pulse: 94  Weight: 114 lb 8 oz (51.9 kg)  Height: 5\' 1"  (1.549 m)    Not recorded      Body mass index is 21.63 kg/m.  PHYSICAL EXAMNIATION:  Gen: NAD, conversant, well nourised, obese, well groomed                     Cardiovascular: Regular rate rhythm, no peripheral edema, warm, nontender. Eyes:  Conjunctivae clear without exudates or hemorrhage Neck: Supple, no carotid bruits. Pulmonary: Clear to auscultation bilaterally   NEUROLOGICAL EXAM:  MENTAL STATUS: Speech:    Speech is normal; fluent and spontaneous with normal comprehension.  Cognition:     Orientation to time, place and person     Normal recent and remote memory     Normal Attention span and concentration     Normal Language, naming, repeating,spontaneous speech     Fund of knowledge   CRANIAL NERVES: CN II: Visual fields are full to confrontation. Fundoscopic exam is normal with sharp discs and no vascular changes. Pupils are round equal and briskly reactive to light. CN III, IV, VI: extraocular movement are normal. No ptosis. CN V: Facial sensation is intact to pinprick in all 3 divisions bilaterally. Corneal responses are intact.  CN VII:  Face is symmetric with normal eye closure and smile. CN VIII: Hearing is normal to rubbing fingers CN IX, X: Palate elevates symmetrically. Phonation is normal. CN XI: Head turning and shoulder shrug are intact CN XII: Tongue is midline with normal movements and no atrophy.  MOTOR: She has moderate spasticity of bilateral lower extremity, mild to moderate proximal lower extremity weakness,   REFLEXES: Reflexes are 2+ and symmetric at the biceps, triceps, knees, and ankles. Plantar responses Extensor bilaterally  Sensory: Intact to light touch  COORDINATION: Rapid alternating movements and fine finger movements are intact. There is no dysmetria on finger-to-nose and heel-knee-shin.    GAIT/STANCE: She rely on her walker to get up from seated position, significant bilateral adductor muscle spasm, rubbing her knees together while walking, spastic, unsteady slow cautious gait, bilateral flat feet, difficulty to clear bilateral feet from the floor DIAGNOSTIC DATA (LABS, IMAGING, TESTING)  - I reviewed patient records, labs, notes, testing and imaging myself where  available.   ASSESSMENT AND PLAN  DELORAS REICHARD is a 72 y.o. female   Spastic paraparesis.  Relapsing remitting multiple sclerosis with cervical cord involvement, history of spondylitic cervical myelopathy   Under electric stimulation we have performed xeomin injection today use 400 units  Right adductor longus 200 Right iliopsoas 100 units  Left adductor longus 200 units Left iliopsoas muscle 100 units.  Patient tolerate the injection well, returned to 3 months for repeat injection   Marcial Pacas, M.D. Ph.D.  [facility] [address] [place], [place] [address] Ph: [phone] Fax: [phone]  CC: Hulan Fess, MD

## 2017-03-24 NOTE — Telephone Encounter (Signed)
[employer] calling again requesting a call back to discuss the MS touch form that was checked as deceased. Please call to discuss at Call back # [phone] case # [phone]

## 2017-03-25 NOTE — Telephone Encounter (Signed)
Spoke to [place] again to and relayed that pt is living.  Last seen 03-23-17 for xeomin injection with Dr. [name] for spasticity. She relayed that since received notification again (this may be duplicate) she had to verify.  She verbalized understanding and thank me for calling.

## 2017-04-01 DIAGNOSIS — M47817 Spondylosis without myelopathy or radiculopathy, lumbosacral region: Secondary | ICD-10-CM | POA: Diagnosis not present

## 2017-04-01 DIAGNOSIS — M533 Sacrococcygeal disorders, not elsewhere classified: Secondary | ICD-10-CM | POA: Diagnosis not present

## 2017-04-01 DIAGNOSIS — G894 Chronic pain syndrome: Secondary | ICD-10-CM | POA: Diagnosis not present

## 2017-04-01 DIAGNOSIS — M5137 Other intervertebral disc degeneration, lumbosacral region: Secondary | ICD-10-CM | POA: Diagnosis not present

## 2017-04-01 DIAGNOSIS — Z79899 Other long term (current) drug therapy: Secondary | ICD-10-CM | POA: Diagnosis not present

## 2017-04-01 DIAGNOSIS — Z79891 Long term (current) use of opiate analgesic: Secondary | ICD-10-CM | POA: Diagnosis not present

## 2017-04-08 DIAGNOSIS — G35 Multiple sclerosis: Secondary | ICD-10-CM | POA: Diagnosis not present

## 2017-04-14 ENCOUNTER — Telehealth: Payer: Self-pay | Admitting: Diagnostic Neuroimaging

## 2017-04-14 DIAGNOSIS — G35 Multiple sclerosis: Secondary | ICD-10-CM

## 2017-04-14 NOTE — Telephone Encounter (Signed)
Pt called stating since her last visit in January he symptoms have worsened and is wanting a sooner appt. Stating her body feels numb, when she stands her legs give out and is having trouble getting in and out of the bed. Please call to discuss

## 2017-04-14 NOTE — Telephone Encounter (Signed)
Spoke to pt.  She has been on ampyra for 1 1/2 months.  The last week she has had 2 episodes for having no feeling in her legs (had fall from bed and the in kitchen, she could not get up her husband had to help her).  She has no other sx, no incontinence.  This is different from [place].  ? Ampyra.  Please advise.

## 2017-04-15 ENCOUNTER — Telehealth: Payer: Self-pay | Admitting: Diagnostic Neuroimaging

## 2017-04-15 NOTE — Telephone Encounter (Signed)
Medicare/aarp order sent to [facility] no auth, they will reach out to the patient to schedule.

## 2017-04-15 NOTE — Telephone Encounter (Signed)
Numbness is not likely a side effect from ampyra.   I would recommend checking MRI brain, cervical and thoracic spine.  Orders Placed This Encounter  Procedures  . MR BRAIN W WO CONTRAST  . MR CERVICAL SPINE W WO CONTRAST  . MR THORACIC SPINE Gem R. Kalyb Pemble, MD 8/33/3832, 9:19 AM Certified in Neurology, Neurophysiology and Neuroimaging  [facility] [address], [place], [place] [address] [phone]

## 2017-04-15 NOTE — Addendum Note (Signed)
Addended by: Andrey Spearman R on: 04/15/2017 08:54 AM   Modules accepted: Orders

## 2017-04-15 NOTE — Telephone Encounter (Signed)
Spoke to pt and relayed that Dr. Leta Baptist stated that numbness not likely SE from ampyra.  Ordered MRI's brain, cervical and thoracic spine.  She verbalized understanding.   Pt had xeomin 600u on 03-23-17 with Dr. [name].

## 2017-04-26 ENCOUNTER — Ambulatory Visit
Admission: RE | Admit: 2017-04-26 | Discharge: 2017-04-26 | Disposition: A | Discharge status: Home / Self Care | Payer: Medicare Other | Source: Ambulatory Visit | Attending: Diagnostic Neuroimaging | Admitting: Diagnostic Neuroimaging

## 2017-04-26 DIAGNOSIS — G35 Multiple sclerosis: Secondary | ICD-10-CM

## 2017-04-26 MED ORDER — GADOBENATE DIMEGLUMINE 529 MG/ML IV SOLN
10.0000 mL | Freq: Once | INTRAVENOUS | Status: AC | PRN
  Administered 2017-04-26: 10 mL via INTRAVENOUS

## 2017-04-29 ENCOUNTER — Ambulatory Visit
Admission: RE | Admit: 2017-04-29 | Discharge: 2017-04-29 | Disposition: A | Discharge status: Home / Self Care | Payer: Medicare Other | Source: Ambulatory Visit | Attending: Diagnostic Neuroimaging | Admitting: Diagnostic Neuroimaging

## 2017-04-29 DIAGNOSIS — G35 Multiple sclerosis: Secondary | ICD-10-CM

## 2017-04-29 MED ORDER — GADOBENATE DIMEGLUMINE 529 MG/ML IV SOLN
10.0000 mL | Freq: Once | INTRAVENOUS | Status: AC | PRN
  Administered 2017-04-29: 10 mL via INTRAVENOUS

## 2017-05-04 ENCOUNTER — Telehealth: Payer: Self-pay | Admitting: *Deleted

## 2017-05-04 NOTE — Telephone Encounter (Signed)
Received call back from patient. Advised her that her MRI cervical spine is stable. MRI thoracic spine is unremarkable. MRI brain shows a possible small new left occipital MS plaque. Otherwise it is a stable scan. Dr Leta Baptist will continue with her current plan and medications.  She stated she experienced leg weakness and that was why she had wanted mRIs, but it has improved.  She verbalized understanding, appreciation.

## 2017-05-04 NOTE — Telephone Encounter (Signed)
LVM requesting call back for results. 

## 2017-05-06 DIAGNOSIS — G35 Multiple sclerosis: Secondary | ICD-10-CM | POA: Diagnosis not present

## 2017-06-03 DIAGNOSIS — G35 Multiple sclerosis: Secondary | ICD-10-CM | POA: Diagnosis not present

## 2017-06-11 DIAGNOSIS — N311 Reflex neuropathic bladder, not elsewhere classified: Secondary | ICD-10-CM | POA: Diagnosis not present

## 2017-06-12 DIAGNOSIS — L820 Inflamed seborrheic keratosis: Secondary | ICD-10-CM | POA: Diagnosis not present

## 2017-06-12 DIAGNOSIS — L821 Other seborrheic keratosis: Secondary | ICD-10-CM | POA: Diagnosis not present

## 2017-06-12 DIAGNOSIS — L570 Actinic keratosis: Secondary | ICD-10-CM | POA: Diagnosis not present

## 2017-06-12 DIAGNOSIS — D225 Melanocytic nevi of trunk: Secondary | ICD-10-CM | POA: Diagnosis not present

## 2017-06-12 DIAGNOSIS — D1801 Hemangioma of skin and subcutaneous tissue: Secondary | ICD-10-CM | POA: Diagnosis not present

## 2017-06-22 DIAGNOSIS — M5137 Other intervertebral disc degeneration, lumbosacral region: Secondary | ICD-10-CM | POA: Diagnosis not present

## 2017-06-22 DIAGNOSIS — M47817 Spondylosis without myelopathy or radiculopathy, lumbosacral region: Secondary | ICD-10-CM | POA: Diagnosis not present

## 2017-06-22 DIAGNOSIS — M533 Sacrococcygeal disorders, not elsewhere classified: Secondary | ICD-10-CM | POA: Diagnosis not present

## 2017-06-24 ENCOUNTER — Ambulatory Visit (INDEPENDENT_AMBULATORY_CARE_PROVIDER_SITE_OTHER): Payer: Medicare Other | Admitting: Neurology

## 2017-06-24 ENCOUNTER — Encounter: Payer: Self-pay | Admitting: Neurology

## 2017-06-24 VITALS — BP 123/77 | HR 102 | Ht 61.0 in | Wt 112.0 lb

## 2017-06-24 DIAGNOSIS — G35 Multiple sclerosis: Secondary | ICD-10-CM

## 2017-06-24 DIAGNOSIS — G8250 Quadriplegia, unspecified: Secondary | ICD-10-CM | POA: Diagnosis not present

## 2017-06-24 MED ORDER — INCOBOTULINUMTOXINA 100 UNITS IM SOLR
500.0000 [IU] | INTRAMUSCULAR | Status: DC
  Administered 2017-06-24: 500 [IU] via INTRAMUSCULAR

## 2017-06-24 NOTE — Progress Notes (Signed)
**  Xeomin 100 units x 5 vials, NDC 0259-1610-01, Lot [id number], Exp 08/2019, office supply.//mck,rn**

## 2017-06-24 NOTE — Progress Notes (Addendum)
PATIENT: Amy Bray DOB: [birth year]    HISTORICAL  Amy Bray is a 72 years old right-handed female , seen in refer by my colleague Dr. Leta Bray for evaluation of EMG guided botulism toxin injection for bilateral lower extremity spasticity, gait abnormality, initial evaluation was on September 11 2016.   This is the first time I have seen this patient, I also performed electrical stimulation guided xeomin injection for spastic paraplegia, I used 400 units of xeomin on September 11 2016  She presented with right leg stiffness, spasm, weakness in 2002, was diagnosed with cervical spine disease, had anterior cervical vasectomy and fusion in 2003. Following the surgery, her right leg weakness and spasms significantly improved, however a year later the symptoms returned, she ultimately had second cervical spine surgery in 2006, following second surgery, she only has mild improvement of her symptoms,  Over the years, she continue have progressive worsening spastic bilateral lower extremity, gait abnormality, eventually leading to this diagnosis of relapsing remitting multiple sclerosis,  She was treated with Amy Bray over the past few years, her symptoms has been steady.  We have personally reviewed most recent MRI cervical spine in September 2017, 3 small T2 hyper intensity foci within the spinal cord, adjacent to C5-6 to the left, adjacent to C6-7 to the right, and adjacent to T2, no change compared to previous scan in 2015, evidence of surgical fusion from C3-C6.  MRI of brain showed few scattered T2/FLAIR hyperintensity foci in the subcortical deep matter of the frontal area,  Since Tysarbri infusion, her symptoms overall has been steady, but over the years, she noticed increased bilateral lower extremity muscle spasticity, especially at nighttime, she described her left joint worsens stomach, jerking movement, while ambulating, she noticed bilateral knees rubbing towards each other,  bilateral leg tends to "worse each other. She rely on her walker now.  Previously she had EMG guided Botox injection at [facility] for bilateral lower extremity spasticity  with very good result  UPDATE Dec 5th 2018: Responded very well to previous injection September 11, 2016, there was no significant side effect noticed, we used 400 units of xeomin, today she wants to emphasize on left lower extremity spasm, hip flexion,  UPDATE March 23 2017: She responded very well to previous xeomin injection 400 units to bilateral lower extremity, we used 600 units total, with emphasized on bilateral hip adduction, flexion  UPDATE June 24 2017: We used 500 units xeomin today, she denies significant side effect with 600 units last time, insurance maximum 500 units  REVIEW OF SYSTEMS: Full 14 system review of systems performed and notable only for gait abnormality, back pain   ALLERGIES: No Known Allergies  HOME MEDICATIONS: Current Outpatient Medications  Medication Sig Dispense Refill  . acetaminophen (TYLENOL) 500 MG tablet Take 500 mg by mouth as needed.    . baclofen (LIORESAL) 10 MG tablet Take 2 tablets (20 mg total) by mouth 3 (three) times daily. 180 tablet 12  . Cranberry-Vitamin C-Vitamin E (CRANBERRY PLUS VITAMIN C) 4200-20-3 MG-MG-UNIT CAPS Take by mouth.    . dalfampridine 10 MG TB12 Take 1 tablet (10 mg total) by mouth 2 (two) times daily. 180 tablet 4  . denosumab (PROLIA) 60 MG/ML SOLN injection Inject 60 mg into the skin every 6 (six) months. Administer in upper arm, thigh, or abdomen    . lisinopril (PRINIVIL,ZESTRIL) 10 MG tablet Take 1 tablet by mouth daily.    Marland Kitchen MYRBETRIQ 50 MG TB24 tablet 50 mg.    Marland Kitchen  natalizumab (TYSABRI) 300 MG/15ML injection Inject into the vein.    Marland Kitchen nortriptyline (PAMELOR) 50 MG capsule Take 1 capsule by mouth daily.    . traMADol-acetaminophen (ULTRACET) 37.5-325 MG per tablet Take 1 tablet by mouth daily as needed.     Current Facility-Administered Medications    Medication Dose Route Frequency Provider Last Rate Last Dose  . incobotulinumtoxinA (XEOMIN) 100 units injection 400 Units  400 Units Intramuscular Q90 days Amy Pacas, MD   400 Units at 09/11/16 1552  . incobotulinumtoxinA (XEOMIN) 100 units injection 600 Units  600 Units Intramuscular Q90 days Amy Pacas, MD   600 Units at 03/23/17 1421    PAST MEDICAL HISTORY: Past Medical History:  Diagnosis Date  . Cervical myelopathy ([place])   . Multiple sclerosis ([place])   . Osteoporosis 2018   hip    PAST SURGICAL HISTORY: Past Surgical History:  Procedure Laterality Date  . CERVICAL FUSION  01/2001, 01/2004   C3-C6    FAMILY HISTORY: Family History  Problem Relation Age of Onset  . Transient ischemic attack Mother   . Pancreatic cancer Father   . Breast cancer Cousin     SOCIAL HISTORY:  Social History   Socioeconomic History  . Marital status: Married    Spouse name: Amy Bray  . Number of children: 0  . Years of education: College  . Highest education level: Not on file  Occupational History    Employer: OTHER    Comment: n/a  Social Needs  . Financial resource strain: Not on file  . Food insecurity:    Worry: Not on file    Inability: Not on file  . Transportation needs:    Medical: Not on file    Non-medical: Not on file  Tobacco Use  . Smoking status: Former Smoker    Packs/day: 1.00    Years: 15.00    Pack years: 15.00    Types: Cigarettes    Last attempt to quit: 01/21/1983    Years since quitting: 34.4  . Smokeless tobacco: Never Used  Substance and Sexual Activity  . Alcohol use: Yes    Alcohol/week: 0.0 oz    Comment: occasionally- wine  . Drug use: No  . Sexual activity: Not on file  Lifestyle  . Physical activity:    Days per week: Not on file    Minutes per session: Not on file  . Stress: Not on file  Relationships  . Social connections:    Talks on phone: Not on file    Gets together: Not on file    Attends religious service: Not on file     Active member of club or organization: Not on file    Attends meetings of clubs or organizations: Not on file    Relationship status: Not on file  . Intimate partner violence:    Fear of current or ex partner: Not on file    Emotionally abused: Not on file    Physically abused: Not on file    Forced sexual activity: Not on file  Other Topics Concern  . Not on file  Social History Narrative   Patient lives at home with her spouse.   Caffeine Use-none     PHYSICAL EXAM   There were no vitals filed for this visit.  Not recorded      There is no height or weight on file to calculate BMI.  PHYSICAL EXAMNIATION:  Gen: NAD, conversant, well nourised, obese, well groomed  Cardiovascular: Regular rate rhythm, no peripheral edema, warm, nontender. Eyes: Conjunctivae clear without exudates or hemorrhage Neck: Supple, no carotid bruits. Pulmonary: Clear to auscultation bilaterally   NEUROLOGICAL EXAM:  MENTAL STATUS: Speech:    Speech is normal; fluent and spontaneous with normal comprehension.  Cognition:     Orientation to time, place and person     Normal recent and remote memory     Normal Attention span and concentration     Normal Language, naming, repeating,spontaneous speech     Fund of knowledge   CRANIAL NERVES: CN II: Visual fields are full to confrontation. Fundoscopic exam is normal with sharp discs and no vascular changes. Pupils are round equal and briskly reactive to light. CN III, IV, VI: extraocular movement are normal. No ptosis. CN V: Facial sensation is intact to pinprick in all 3 divisions bilaterally. Corneal responses are intact.  CN VII: Face is symmetric with normal eye closure and smile. CN VIII: Hearing is normal to rubbing fingers CN IX, X: Palate elevates symmetrically. Phonation is normal. CN XI: Head turning and shoulder shrug are intact CN XII: Tongue is midline with normal movements and no atrophy.  MOTOR: She has  moderate spasticity of bilateral lower extremity, mild to moderate proximal lower extremity weakness,   REFLEXES: Reflexes are 2+ and symmetric at the biceps, triceps, knees, and ankles. Plantar responses Extensor bilaterally  Sensory: Intact to light touch  COORDINATION: Rapid alternating movements and fine finger movements are intact. There is no dysmetria on finger-to-nose and heel-knee-shin.    GAIT/STANCE: She rely on her walker to get up from seated position, significant bilateral adductor muscle spasm, rubbing her knees together while walking, spastic, unsteady slow cautious gait, bilateral flat feet, difficulty to clear bilateral feet from the floor DIAGNOSTIC DATA (LABS, IMAGING, TESTING)  - I reviewed patient records, labs, notes, testing and imaging myself where available.   ASSESSMENT AND PLAN  Amy Bray is a 72 y.o. female   Spastic paraparesis.  Relapsing remitting multiple sclerosis with cervical cord involvement, history of spondylitic cervical myelopathy   Under electric stimulation we have performed xeomin injection today use 500 units  Right adductor longus 200 Right adductor magnus 100 units  Left adductor longus 100 units Left adductor magnus 100  Patient tolerate the injection well, returned to 3 months for repeat injection   Amy Bray, M.D. Ph.D.  [facility] [address] [place], [place] [address] Ph: [phone] Fax: [phone]  CC: Hulan Fess, MD

## 2017-07-01 ENCOUNTER — Telehealth: Payer: Self-pay | Admitting: Diagnostic Neuroimaging

## 2017-07-01 DIAGNOSIS — G35 Multiple sclerosis: Secondary | ICD-10-CM | POA: Diagnosis not present

## 2017-07-01 NOTE — Telephone Encounter (Addendum)
Spoke with patient who stated she is taking generic ampyra now. Price has increased from $120/90 days to $600/90 days at [facility] [employer]. She spoke with [facility] today and was told this office had to call [employer].  [facility], spoke with Gelo who checked Ampyra approval. He stated it was intimally approved in 2016, and approval was extended from 04/06/2017 -04/07/2018. He stated the estimated co pay is $75 /30 days supply.  PA # [id number]. He could not tell this RN whether the patient should get medicine from [facility] or [facility].  [facility], spoke with Hildred Alamin, who was unable to run Ampyra because there was not an active Rx on file.   [place], spoke with Jinny Blossom who stated if pt gets med from [facility], her cost is $150/ 30 days. Her plan will not allow 90 day supply. Patient is in Stage 2 of her prescription drug plan and must pay 35% of cost of medication.  She checked with clinical review pharmacist to see if tier exception could be considered. Helene Kelp came on line to initiate tier exception. She stated that the decision will be faxed over within 72 hours. This RN or patient may also call  [phone] for the decision. This call's Ref [id number].  Called patient to inform her and while on phone received denial of tier exception. Advised patient this RN will call [employer] tomorrow. She verbalized understanding, appreciation.

## 2017-07-01 NOTE — Addendum Note (Signed)
Addended by: Minna Antis on: 07/01/2017 05:07 PM   Modules accepted: Orders

## 2017-07-01 NOTE — Telephone Encounter (Signed)
LVM advising patient this RN will try to reach her later today.

## 2017-07-01 NOTE — Telephone Encounter (Signed)
Patient has requested for Dr. Gladstone Lighter RN, to call her to discuss her Empira medication that she is on. She has asked to be called after 3pm today or anytime on 6/13.

## 2017-07-02 NOTE — Telephone Encounter (Addendum)
[facility] clinical pharmacy review, the approximate cost for patient to get generic, dalfampridine is $300/30 days. Transferred to  [place] dr call dept to get exact cost, spoke with [place], [phone] who stated the generic is $210.18/ 30 days.  She advised the patient can call [facility], dept for specialty medications [phone] to see if any financial assistance can be provided. She can also call [place] service for her insurance,  [phone] to get  formulary choices for medications in the same class.  Called patient and gave her 2 numbers listed. She will call and let this RN know what information she receives. She is out of medication, has been x 1 month. This RN asked her why she hadn't called. She stated she has "been back and forth with [facility] trying to figure it out." She verbalized understanding, appreciation.

## 2017-07-06 DIAGNOSIS — H35363 Drusen (degenerative) of macula, bilateral: Secondary | ICD-10-CM | POA: Diagnosis not present

## 2017-07-06 DIAGNOSIS — G35 Multiple sclerosis: Secondary | ICD-10-CM | POA: Diagnosis not present

## 2017-07-13 NOTE — Telephone Encounter (Signed)
Called patient for update on Ampyra. She stated she was going to call this RN tomorrow. She stated she had no luck in getting financial aid. She did not ask about approved formulary medications in the same class. She stated she will call [facility] tomorrow and let this RN know. She checked on price of brand and generic with her pharmacy. Both are cost prohibitive for her now.  She verbalized appreciation of call.

## 2017-07-14 ENCOUNTER — Encounter: Payer: Self-pay | Admitting: *Deleted

## 2017-07-14 NOTE — Telephone Encounter (Signed)
Patient is returning your call.  

## 2017-07-14 NOTE — Telephone Encounter (Signed)
Pt requesting a call to discuss Ampyra pt would not go into further detail

## 2017-07-14 NOTE — Telephone Encounter (Signed)
LVM requesting call back.

## 2017-07-14 NOTE — Telephone Encounter (Signed)
Called patient who stated there is no other medication that [facility] will cover similar to Ampyra which is now a tier 4. This RN had already done appeal for tier exception, and it was denied. [employer] will cover it, but the cost is prohibitive for the patient. Patient stated it was helping her walk. She has follow up next month. This RN stated will ask Dr Stephannie Li if he has other recommendations and call her back. She verbalized understanding, appreciation.

## 2017-07-14 NOTE — Telephone Encounter (Signed)
Faxed appeal letter to [facility] and Appeals requesting another consideration of coverage for Ampyra.

## 2017-07-20 ENCOUNTER — Telehealth: Payer: Self-pay | Admitting: *Deleted

## 2017-07-20 ENCOUNTER — Encounter: Payer: Self-pay | Admitting: *Deleted

## 2017-07-20 NOTE — Telephone Encounter (Signed)
error 

## 2017-07-20 NOTE — Telephone Encounter (Signed)
Discussed alternate medication for Ampyra re: 4-AP (Aminopyridine) with Dr Leta Baptist. Had previously spoken with Legrand Como, pharmacist at [facility]. This is a compounded capsule, cost for 30 days is $75.38. Spoke with patient to discuss. She will call her insurance company to check coverage and will discuss it with Dr Leta Baptist in her FU on 08/10/17.  She verbalized understanding, appreciation of call.

## 2017-07-20 NOTE — Telephone Encounter (Signed)
Patient called stating she has been summoned for jury duty. She stated she wrote a letter but was informed that her physician must send a letter to get her excused. This RN advised will provide latter. She gave address to have it mailed, stated it must be received before the end of July. This RN advised will mail letter for her. She verbalize appreciation.

## 2017-07-20 NOTE — Telephone Encounter (Signed)
Received denial of appeal for ampyra.  REF # [id number].

## 2017-07-22 NOTE — Telephone Encounter (Signed)
LVM requesting call back. Advised her this office closes today at 5 pm, is closed tomorrow and Friday.

## 2017-07-22 NOTE — Telephone Encounter (Signed)
Agree. -VRP 

## 2017-07-22 NOTE — Telephone Encounter (Addendum)
Patient called back stating she has found a pharmacy to compound Aminopyridine. It is [facility], phone [phone],  fax [phone]. Her cost would be $40/ 30 days. She stated the pharmacist told her the prescription could be faxed to them. She stated that she would like to try it if Dr Leta Baptist agrees.  This RN stated that he may want to see her in follow up later this month first, but this RN will route this note to Dr Leta Baptist. She verbalized understanding, appreciation.

## 2017-07-22 NOTE — Telephone Encounter (Signed)
Pt asked to have RN call her regarding ampyra but did not want to leave any msg.

## 2017-07-27 ENCOUNTER — Encounter: Payer: Self-pay | Admitting: *Deleted

## 2017-07-27 NOTE — Telephone Encounter (Signed)
Per Dr Leta Baptist, called [facility], spoke with pharmacist, Ronalee Belts and gave him verbal order for Aminopyridine (4-AP) 5 mg capsules, take one capsule (5 mg total) by mouth three times daily. Disp #90 capsules, 11 refills.  Ronalee Belts correctly repeated entire prescription, stated he will call patient to schedule shipment, verbalized understanding of call.

## 2017-07-29 DIAGNOSIS — G35 Multiple sclerosis: Secondary | ICD-10-CM | POA: Diagnosis not present

## 2017-08-07 DIAGNOSIS — M810 Age-related osteoporosis without current pathological fracture: Secondary | ICD-10-CM | POA: Diagnosis not present

## 2017-08-10 ENCOUNTER — Encounter: Payer: Self-pay | Admitting: Diagnostic Neuroimaging

## 2017-08-10 ENCOUNTER — Ambulatory Visit (INDEPENDENT_AMBULATORY_CARE_PROVIDER_SITE_OTHER): Payer: Medicare Other | Admitting: Diagnostic Neuroimaging

## 2017-08-10 VITALS — BP 113/73 | HR 108 | Ht 61.0 in | Wt 111.2 lb

## 2017-08-10 DIAGNOSIS — G35 Multiple sclerosis: Secondary | ICD-10-CM

## 2017-08-10 MED ORDER — BACLOFEN 10 MG PO TABS: 20.0000 mg | ORAL_TABLET | Freq: Three times a day (TID) | ORAL | 12 refills | Status: DC

## 2017-08-10 NOTE — Progress Notes (Signed)
JCV stratify [facility] 08-10-17 [facility].

## 2017-08-10 NOTE — Progress Notes (Signed)
[facility]  PATIENT: Amy Bray DOB: May 25, 1945  REFERRING CLINICIAN:  HISTORY FROM: patient and husband  REASON FOR VISIT: follow up   HISTORICAL  CHIEF COMPLAINT:  Chief Complaint  Patient presents with  . Follow-up  . Multiple Sclerosis    using walker,  intrafusion with tysabri.  AP-4 taking the last 1-1/2 wks.  Husband with her. Rm 6.    HISTORY OF PRESENT ILLNESS:   UPDATE (08/10/17, VRP): Since last visit, doing well. Tolerating tysabri and generic amprya. No alleviating or aggravating factors. Hot flashes improved. Fatigue improved.   UPDATE (02/11/17, VRP): Since last visit, doing well. Tolerating meds. No alleviating or aggravating factors. Now on xeomin (Aug and Dec 2018) and spasticity improved. On generic ampyra and doing well.   UPDATE 08/11/16: Since last visit, doing well. Tolerating tysabri and baclofen. Heat has been challenging with spasms. Gait diff with knees rubbing together, slightly worse.  UPDATE 02/11/16: Since last visit, balance diff continues. No flares. MRIs stable. Tolerating tysabri.   UPDATE 10/08/15: Since last visit, pt feels some progression of gait diff and walking problems. No specific attack or flare up.  UPDATE 07/04/15: Since last visit, doing well. Tolerating tysabri and ampyra.  UPDATE 04/02/15: Since last visit, doing well. Tolerating tysabri and ampyra. Back on nortriptyline (is helping burning pain in feet).   UPDATE 01/03/15: Since last visit, doing better on ampyra (better gait and walking speed). More foot/calf tingling burning pain x 1-2 months. Now with Dr. Andree Elk pain mgmt for back injections x 2, which is helping.  UPDATE 10/02/14: Since last visit, doing well. Requesting transition of care to [facility] for pain mgmt; previously at [facility] with Dr. Oliva Bustard.  UPDATE 05/22/14: Since last visit, doing well. On tysabri since March 2016. Fatigue and bladder control much improved. Gait is stable.   UPDATE 02/20/14:  Since last visit, had reviewed lab results and now ready to start MS medication. Now would like to start tysabri.  UPDATE 01/09/14: Since last visit, no new events symptoms. LP results reviewed. Didn't get lab work at last visit for MS mimics.   UPDATE 12/07/13: Patient is stable with symptoms. MRI scans reviewed. Continues to have weakness in legs and balance difficulty.  UPDATE 11/21/13: 72 year old right-handed female here for evaluation of lower extremity spasms and weakness. 2002 patient developed onset of right leg stiffness, spasm, weakness. Patient was diagnosed with cervical spine disease, bone spur ridging, and treated with anterior cervical discectomy and fusion in 2003. Following this her right leg weakness and spasms significantly improved. However one year later her symptoms return. Patient followed up with several surgeons and ultimately had a second cervical spine surgery in 2006. Following this patient had slight improvement of right leg however within several months her weakness, spasms and gait continued to decline. Patient transitioned needing a cane to walk. 2013 patient was evaluated by a neurologist in [place], to evaluate her right leg weakness. Apparently patient had a "multiple sclerosis" workup including MRI of the brain and extensive blood testing. Patient also had EMG nerve conduction studies looking for peroneal neuropathy but apparently all studies were normal. MRI of the brain did show a right medulla lesion which was reported as a stroke. Over the past 1 year patient has developed new onset left leg weakness, spasm, stiffness. Patient followed up with orthopedic surgery who then referred patient to me for evaluation. Last MRI of cervical spine was in 2006 at [facility]. No bowel or bladder incontinence. No  significant numbness or tingling. No weakness in upper extremities. No vision loss, vision changes, slurred speech or trouble talking.   REVIEW OF SYSTEMS: Full 14 system  review of systems performed and negative except: muscle cramps restless legs.    ALLERGIES: No Known Allergies  HOME MEDICATIONS: Outpatient Medications Prior to Visit  Medication Sig Dispense Refill  . acetaminophen (TYLENOL) 500 MG tablet Take 500 mg by mouth as needed.    . baclofen (LIORESAL) 10 MG tablet Take 2 tablets (20 mg total) by mouth 3 (three) times daily. 180 tablet 12  . Cranberry-Vitamin C-Vitamin E (CRANBERRY PLUS VITAMIN C) 4200-20-3 MG-MG-UNIT CAPS Take by mouth.    . Dalfampridine (4-AMINOPYRIDINE) POWD 5 mg by Does not apply route 3 (three) times daily. 5mg  capsules, one tid    . denosumab (PROLIA) 60 MG/ML SOLN injection Inject 60 mg into the skin every 6 (six) months. Administer in upper arm, thigh, or abdomen    . incobotulinumtoxinA (XEOMIN) 100 units SOLR injection Inject 500 Units into the muscle every 3 (three) months.    Marland Kitchen lisinopril (PRINIVIL,ZESTRIL) 10 MG tablet Take 1 tablet by mouth daily.    Marland Kitchen MYRBETRIQ 50 MG TB24 tablet 50 mg.    . natalizumab (TYSABRI) 300 MG/15ML injection Inject into the vein.    Marland Kitchen nortriptyline (PAMELOR) 50 MG capsule Take 1 capsule by mouth daily.    . traMADol-acetaminophen (ULTRACET) 37.5-325 MG per tablet Take 1 tablet by mouth daily as needed.     Facility-Administered Medications Prior to Visit  Medication Dose Route Frequency Provider Last Rate Last Dose  . incobotulinumtoxinA (XEOMIN) 100 units injection 500 Units  500 Units Intramuscular Q90 days Marcial Pacas, MD   500 Units at 06/24/17 1408    PAST MEDICAL HISTORY: Past Medical History:  Diagnosis Date  . Cervical myelopathy ([place])   . Multiple sclerosis ([place])   . Osteoporosis 2018   hip    PAST SURGICAL HISTORY: Past Surgical History:  Procedure Laterality Date  . CERVICAL FUSION  01/2001, 01/2004   C3-C6    FAMILY HISTORY: Family History  Problem Relation Age of Onset  . Transient ischemic attack Mother   . Pancreatic cancer Father   . Breast cancer  Cousin     SOCIAL HISTORY:  Social History   Socioeconomic History  . Marital status: Married    Spouse name: Zahli Vetsch  . Number of children: 0  . Years of education: College  . Highest education level: Not on file  Occupational History    Employer: OTHER    Comment: n/a  Social Needs  . Financial resource strain: Not on file  . Food insecurity:    Worry: Not on file    Inability: Not on file  . Transportation needs:    Medical: Not on file    Non-medical: Not on file  Tobacco Use  . Smoking status: Former Smoker    Packs/day: 1.00    Years: 15.00    Pack years: 15.00    Types: Cigarettes    Last attempt to quit: 01/21/1983    Years since quitting: 34.5  . Smokeless tobacco: Never Used  Substance and Sexual Activity  . Alcohol use: Yes    Alcohol/week: 0.0 oz    Comment: occasionally- wine  . Drug use: No  . Sexual activity: Not on file  Lifestyle  . Physical activity:    Days per week: Not on file    Minutes per session: Not on file  . Stress:  Not on file  Relationships  . Social connections:    Talks on phone: Not on file    Gets together: Not on file    Attends religious service: Not on file    Active member of club or organization: Not on file    Attends meetings of clubs or organizations: Not on file    Relationship status: Not on file  . Intimate partner violence:    Fear of current or ex partner: Not on file    Emotionally abused: Not on file    Physically abused: Not on file    Forced sexual activity: Not on file  Other Topics Concern  . Not on file  Social History Narrative   Patient lives at home with her spouse.   Caffeine Use-none     PHYSICAL EXAM  GENERAL EXAM/CONSTITUTIONAL: Vitals:  Vitals:   08/10/17 1339  BP: 113/73  Pulse: (!) 108  Weight: 111 lb 3.2 oz (50.4 kg)  Height: 5\' 1"  (1.549 m)     Body mass index is 21.01 kg/m. Wt Readings from Last 3 Encounters:  08/10/17 111 lb 3.2 oz (50.4 kg)  06/24/17 112 lb (50.8 kg)   03/23/17 114 lb 8 oz (51.9 kg)     Patient is in no distress; well developed, nourished and groomed; neck is supple  CARDIOVASCULAR:  Examination of carotid arteries is normal; no carotid bruits  Regular rate and rhythm, no murmurs  Examination of peripheral vascular system by observation and palpation is normal  EYES:  Ophthalmoscopic exam of optic discs and posterior segments is normal; no papilledema or hemorrhages  Visual Acuity Screening   Right eye Left eye Both eyes  Without correction:     With correction: 20/30 20/30      MUSCULOSKELETAL:  Gait, strength, tone, movements noted in Neurologic exam below  NEUROLOGIC: MENTAL STATUS:  No flowsheet data found.  awake, alert, oriented to person, place and time  recent and remote memory intact  normal attention and concentration  language fluent, comprehension intact, naming intact,   fund of knowledge appropriate  CRANIAL NERVE:   2nd - no papilledema on fundoscopic exam  2nd, 3rd, 4th, 6th - pupils equal and reactive to light, visual fields full to confrontation, extraocular muscles intact, no nystagmus  5th - facial sensation symmetric  7th - facial strength symmetric  8th - hearing intact  9th - palate elevates symmetrically, uvula midline  11th - shoulder shrug symmetric  12th - tongue protrusion midline  MOTOR:   normal bulk and tone, full strength in the BUE; EXCEPT MILD INCREASED EXT TONE IN BLE; BLE (HF 3-4, KE 5 KF 4 DF 4)  SENSORY:   normal and symmetric to light touch, temperature; DECR AT TOES  COORDINATION:  finger-nose-finger, fine finger movements normal;  REFLEXES:   deep tendon reflexes present and symmetric; EXCEPT BUE 3; BLE (HIP FLEXION 3; KNEES 3; ANKLES 2)  GAIT/STATION:   UNSTEADY, SPASTIC, PARAPARETIC GAIT; USES ROLLATOR WALKER   MENTAL STATUS: awake, alert, language fluent, comprehension intact, naming intact, fund of knowledge appropriate CRANIAL NERVE:  pupils equal and reactive to light, visual fields full to confrontation, extraocular muscles intact, no nystagmus, facial sensation and strength symmetric, hearing intact, palate elevates symmetrically, uvula midline, shoulder shrug symmetric, tongue midline. MOTOR: normal bulk and tone in BUE;  SENSORY: DECR VIB AT TOES  COORDINATION: finger-nose-finger, fine finger movements --> MINIMAL ACTION TREMOR IN BUE REFLEXES:  GAIT/STATION: SPASTIC, PARAPARETIC GAIT; USES ROLLATOR WALKER  DIAGNOSTIC DATA (LABS, IMAGING, TESTING) - I reviewed patient records, labs, notes, testing and imaging myself where available.  Lab Results  Component Value Date   WBC 6.3 02/11/2017   HGB 12.5 02/11/2017   HCT 36.8 02/11/2017   MCV 93 02/11/2017   PLT 360 02/11/2017      Component Value Date/Time   NA 147 (H) 02/11/2017 1350   K 4.3 02/11/2017 1350   CL 106 02/11/2017 1350   CO2 27 02/11/2017 1350   GLUCOSE 105 (H) 02/11/2017 1350   BUN 21 02/11/2017 1350   CREATININE 0.81 02/11/2017 1350   CALCIUM 9.5 02/11/2017 1350   PROT 6.4 02/11/2017 1350   ALBUMIN 4.4 02/11/2017 1350   AST 14 02/11/2017 1350   ALT 14 02/11/2017 1350   ALKPHOS 32 (L) 02/11/2017 1350   BILITOT <0.2 02/11/2017 1350   GFRNONAA 74 02/11/2017 1350   GFRAA 85 02/11/2017 1350   No results found for: CHOL, HDL, LDLCALC, LDLDIRECT, TRIG, CHOLHDL No results found for: HGBA1C No results found for: VITAMINB12 No results found for: TSH  Vit D, 25-Hydroxy  Date Value Ref Range Status  01/03/2015 36.8 30.0 - 100.0 ng/mL Final    Comment:    Vitamin D deficiency has been defined by the [place] practice guideline as a level of serum 25-OH vitamin D less than 20 ng/mL (1,2). The Endocrine Society went on to further define vitamin D insufficiency as a level between 21 and 29 ng/mL (2). 1. IOM (Institute of Medicine). 2010. Dietary reference    intakes for calcium and D. [place]:  The    [employer]. 2. Holick MF, Binkley [place], Bischoff-Ferrari HA, et al.    Evaluation, treatment, and prevention of vitamin D    deficiency: an Endocrine Society clinical practice    guideline. JCEM. 2011 Jul; 96(7):1911-30.     I reviewed images myself. In my review, I think there is a 2cm spinal cord lesion (right side, C6-C7) and a smaller left T1 lesion, suspicious for demyelinating disease, on the MRI cervical spine from 10/08/03. No adjacent disc or bone degeneration or spinal stenosis. The MRI report states that the cord appears normal. -VRP  10/08/03 MRI cervical spine (images reviewed) - Satisfactory anterior plate fusion of C4 and C5.Disk degeneration and spondylosis at C3-4 and C5-6 with biforaminal narrowing but no significant central canal stenosis. There is no disk protrusion.   11/06/11 MRI brain (report only) - "subtle area of increased T2 intensity in the right posterior upper medulla likely representing old infarct or possibly area of previous inflammation. Otherwise normal MRI of the brain."  12/04/13 MRI brain (with and without) demonstrating: 1. Few punctate foci of right frontal subcortical and periventricular non-specific gliosis. These findings are non-specific and considerations include autoimmune, inflammatory, post-infectious, microvascular ischemic or migraine associated etiologies.  2. No abnormal enhancing lesions. No acute findings.  12/04/13 MRI cervical spine (with and without) demonstrating: 1. The spinal cord is notable for 3 subtle T2 hyperintense lesions: C5-6 on the left, C7 on the right, T2 slightly on the right of midline. No abnormal enhancing lesions. Considerations include autoimmune, inflammatory or post-infectious etiologies. No adjacent disc disease to suggest mechanical compressive etiologies.  2. At C6-7: left uncovertebral joint hypertrophy with moderate left foraminal stenosis. 3. Compared to prior MRI on 10/08/03, the C5-6  lesion appears new; the C7 and T2 spinal cord lesions appear stable. Also, there has been change in ACDF levels (previously from C4-C5;  now C3-C6).   12/04/13 MRI thoracic spine (with and without) - unremarkable  12/29/13 LP - WBC 1, RBC 0, protein 26, glucose 59, OCB > 5, gram stain neg  09/28/14 MRI brain [I reviewed images myself and agree with interpretation. -VRP]  1. There is a small T2/FLAIR hyperintense focus in the right frontal lobe. This is nonspecific and could be idiopathic or represent a small focus of chronic microvascular ischemic change or demyelination.. The extent is within a normal range for her age. 2. There are no enhancing lesions or acute findings 3. Compared to the MRI dated 12/04/2013, there is no definite interval change.  04/11/15 MRI brain (with and without) demonstrating: 1. Subtle right and left frontal subcortical foci of non-specific gliosis. No abnormal lesions are seen on post contrast views. These findings are non-specific and considerations include autoimmune, inflammatory, post-infectious, microvascular ischemic or migraine associated etiologies.  2. No acute findings. 3. No change from MRI on 09/28/14.   10/19/15 MRI cervical spine with and without contrast showing the following: 1.   There are 3 small T2 hyperintense foci within the spinal cord, adjacent to C5-C6 to the left, adjacent to C6-C7 to the right and adjacent to T2. These foci were also noted on the 12/04/2013 MRI and appear unchanged. 2.   Surgical fusion from C3-C6. This appears unchanged. 3.   Mild degenerative changes at C2-C3 and C6-C7 that do not lead to any nerve root compression. 4.   There is a normal enhancement pattern. There are no acute findings.  10/19/15 MRI thoracic spine with and without contrast shows the following: 1.    T2 hyperintense foci adjacent to C7-T1 to the right and adjacent to T2 to the right. Neither of these appear to be acute.  They are present on the 2015 MRIs  as well. 2.    There are no significant degenerative changes. 3.    There is a normal enhancement pattern.  10/29/15 MRI brain  1.   A few scattered T2/FLAIR hyperintense foci in the subcortical deep white matter of the frontal lobes. This is a nonspecific finding and could be idiopathic or due to chronic microvascular ischemic changes, demyelination or trauma. None of the foci appears to be acute and there is no change when compared to the MRI dated 04/11/2015. 2.   There are no acute findings.  08/24/16 MRI brain (with and without) demonstrating:  1. Scattered T2/flair hyperintense foci in the subcortical and deep white matter of both hemispheres. This is a nonspecific finding and could be seen with chronic microvascular ischemic change or be due to demyelination or sequela of prior inflammation or trauma. None of these appear to be acute and there are no new lesions when compared to the 10/29/2015 MRI.  2. Mild cortical atrophy. 3. There are no acute findings and there is a normal enhancement pattern.  04/26/17 MRI of the brain with and without contrast [I reviewed images myself and agree with interpretation. 1 small new lesion. -VRP]  1.   Small left occipital subcortical T2/FLAIR hyperintense focus with subtle hyperintensity on diffusion-weighted images.  This focus was not present on the 08/24/2016 MRI.  The etiology is uncertain but it could represent a sub-chronic focus of chronic ischemic or demyelinating change.   2.   There are a couple T2/FLAIR hyperintense foci in the subcortical and deep white matter.  This is a nonspecific finding and could be incidental at this age, will be due to minimal chronic microvascular ischemic change  or demyelination.  None of the foci appears to be acute and there is no change when compared to the 08/24/2016 MRI. 3.   Mild generalized cortical atrophy, typical for age. 4.   There is a normal enhancement pattern.  04/26/17 MRI of the cervical spine with and without  contrast [I reviewed images myself and agree with interpretation. Stable since 2017.  -VRP]  1.   There are 3 small T2/FLAIR hyperintense foci within the spinal cord adjacent to C6, C7 and T2.  All of these were present on the 10/19/2015 MRI.  None of these enhance. 2.   Multilevel degenerative changes as detailed above.   The degenerative changes at C6-C7 have slightly progressed when compared to the previous MRI.  There did not appear to be any nerve root compression. 3.   ACDF from C3 through C6.   4.   There is a normal enhancement pattern of there are no acute findings.  04/26/17 Unremarkable MRI thoracic spine (with and without). [I reviewed images myself and agree with interpretation. -VRP]    Anti-JCV antibody 01/10/14 - 0.10 negative  05/22/14 - 0.09 negative 12/2014 - negative March 2017 - negative 10/09/15 - 0.13 negative 02/11/16 JCV antibody 0.11 negative 08-13-16 JCV antibody 0.11 negative sy    ASSESSMENT AND PLAN  72 y.o. year old female here with  bilateral lower extremity spasticity, weakness,with brisk reflexes in upper and lower extremities, left Hoffman sign, bilateral upgoing toes. MRIs from 2005 and 2015 demonstrate multiple spinal cord lesions. Now suspect demyelinating disease, with first attack in 2002, second attack in 2003, and 3rd attack in 2014.  Then with some progression of symptoms since mid-2017.  Some worsening balance since jan 2019.    Dx: multiple sclerosis   MS (multiple sclerosis) ([place]) - Plan: CBC with Differential/Platelet, Comprehensive metabolic panel, Stratify JCV Ab (w/ Index) w/ Rflx    PLAN:  I spent 25 minutes of face to face time with patient. Greater than 50% of time was spent in counseling and coordination of care with patient. In summary we discussed:   MULTIPLE SCLEROSIS (mild worsening) - continue tysabri - check labs - continue dalfampridine; walking speed and balance are better with medcation (T25W 46 --> 25s) - refer to  PT  MUSCLE SPASMS (stable) - continue baclofen - continue chemodenervation (per Dr. [name])  Meds ordered this encounter  Medications  . baclofen (LIORESAL) 10 MG tablet    Sig: Take 2 tablets (20 mg total) by mouth 3 (three) times daily.    Dispense:  180 tablet    Refill:  12   Orders Placed This Encounter  Procedures  . CBC with Differential/Platelet  . Comprehensive metabolic panel  . Stratify JCV Ab (w/ Index) w/ Rflx   Return in about 6 months (around 02/10/2018).     Penni Bombard, MD 07/21/7791, 9:03 PM Certified in Neurology, Neurophysiology and Neuroimaging  [facility] [address], [place], [place] [address] [phone]

## 2017-08-11 LAB — COMPREHENSIVE METABOLIC PANEL
ALT: 12 IU/L (ref 0–32)
AST: 11 IU/L (ref 0–40)
Albumin/Globulin Ratio: 2.3 — ABNORMAL HIGH (ref 1.2–2.2)
Albumin: 4.1 g/dL (ref 3.5–4.8)
Alkaline Phosphatase: 45 IU/L (ref 39–117)
BUN/Creatinine Ratio: 24 (ref 12–28)
BUN: 15 mg/dL (ref 8–27)
Bilirubin Total: 0.2 mg/dL (ref 0.0–1.2)
CO2: 25 mmol/L (ref 20–29)
Calcium: 9 mg/dL (ref 8.7–10.3)
Chloride: 103 mmol/L (ref 96–106)
Creatinine, Ser: 0.63 mg/dL (ref 0.57–1.00)
GFR calc Af Amer: 105 mL/min/{1.73_m2} (ref >59)
GFR calc non Af Amer: 91 mL/min/{1.73_m2} (ref >59)
Globulin, Total: 1.8 g/dL (ref 1.5–4.5)
Glucose: 71 mg/dL (ref 65–99)
Potassium: 3.9 mmol/L (ref 3.5–5.2)
Sodium: 145 mmol/L — ABNORMAL HIGH (ref 134–144)
Total Protein: 5.9 g/dL — ABNORMAL LOW (ref 6.0–8.5)

## 2017-08-11 LAB — CBC WITH DIFFERENTIAL/PLATELET
Basophils Absolute: 0 10*3/uL (ref 0.0–0.2)
Basos: 0 %
EOS (ABSOLUTE): 0.1 10*3/uL (ref 0.0–0.4)
Eos: 1 %
Hematocrit: 34 % (ref 34.0–46.6)
Hemoglobin: 11 g/dL — ABNORMAL LOW (ref 11.1–15.9)
Immature Grans (Abs): 0.1 10*3/uL (ref 0.0–0.1)
Immature Granulocytes: 1 %
Lymphocytes Absolute: 1.7 10*3/uL (ref 0.7–3.1)
Lymphs: 17 %
MCH: 30.8 pg (ref 26.6–33.0)
MCHC: 32.4 g/dL (ref 31.5–35.7)
MCV: 95 fL (ref 79–97)
Monocytes Absolute: 1.3 10*3/uL — ABNORMAL HIGH (ref 0.1–0.9)
Monocytes: 12 %
Neutrophils Absolute: 7.2 10*3/uL — ABNORMAL HIGH (ref 1.4–7.0)
Neutrophils: 69 %
Platelets: 284 10*3/uL (ref 150–450)
RBC: 3.57 x10E6/uL — ABNORMAL LOW (ref 3.77–5.28)
RDW: 13.8 % (ref 12.3–15.4)
WBC: 10.4 10*3/uL (ref 3.4–10.8)

## 2017-08-17 NOTE — Progress Notes (Addendum)
Received results JCV negative, index 0.08.  [facility] 08-15-17.  LMVM for pt that lab test negative.

## 2017-08-26 DIAGNOSIS — G35 Multiple sclerosis: Secondary | ICD-10-CM | POA: Diagnosis not present

## 2017-09-08 ENCOUNTER — Telehealth: Payer: Self-pay | Admitting: *Deleted

## 2017-09-08 ENCOUNTER — Telehealth: Payer: Self-pay | Admitting: Diagnostic Neuroimaging

## 2017-09-08 DIAGNOSIS — G35 Multiple sclerosis: Secondary | ICD-10-CM

## 2017-09-08 NOTE — Telephone Encounter (Signed)
LVM informing patient her lab results are unremarkable. Left number for any questions. 

## 2017-09-08 NOTE — Telephone Encounter (Signed)
Pt is agreeable to PT as discussed at last OV. She said a return call is not needed if PT is approved

## 2017-09-08 NOTE — Telephone Encounter (Signed)
Spoke with Dr. Leta Baptist verbally on this and he was ok with placing the order to PT.   Referral placed MB RN.

## 2017-09-09 ENCOUNTER — Encounter: Payer: Self-pay | Admitting: *Deleted

## 2017-09-10 ENCOUNTER — Telehealth: Payer: Self-pay

## 2017-09-10 NOTE — Telephone Encounter (Signed)
Tysabri reauthorization submitted to [employer] 09/09/2017 with confirmation received.  MB RN.

## 2017-09-14 DIAGNOSIS — Z79899 Other long term (current) drug therapy: Secondary | ICD-10-CM | POA: Diagnosis not present

## 2017-09-14 DIAGNOSIS — M47817 Spondylosis without myelopathy or radiculopathy, lumbosacral region: Secondary | ICD-10-CM | POA: Diagnosis not present

## 2017-09-14 DIAGNOSIS — G894 Chronic pain syndrome: Secondary | ICD-10-CM | POA: Diagnosis not present

## 2017-09-14 DIAGNOSIS — M5137 Other intervertebral disc degeneration, lumbosacral region: Secondary | ICD-10-CM | POA: Diagnosis not present

## 2017-09-14 DIAGNOSIS — M533 Sacrococcygeal disorders, not elsewhere classified: Secondary | ICD-10-CM | POA: Diagnosis not present

## 2017-09-14 DIAGNOSIS — Z79891 Long term (current) use of opiate analgesic: Secondary | ICD-10-CM | POA: Diagnosis not present

## 2017-09-15 ENCOUNTER — Telehealth: Payer: Self-pay | Admitting: Diagnostic Neuroimaging

## 2017-09-15 NOTE — Telephone Encounter (Signed)
error 

## 2017-09-23 DIAGNOSIS — G35 Multiple sclerosis: Secondary | ICD-10-CM | POA: Diagnosis not present

## 2017-09-25 ENCOUNTER — Other Ambulatory Visit: Payer: Self-pay | Admitting: Family Medicine

## 2017-09-25 DIAGNOSIS — Z1231 Encounter for screening mammogram for malignant neoplasm of breast: Secondary | ICD-10-CM

## 2017-09-30 ENCOUNTER — Ambulatory Visit (INDEPENDENT_AMBULATORY_CARE_PROVIDER_SITE_OTHER): Payer: Medicare Other | Admitting: Neurology

## 2017-09-30 ENCOUNTER — Encounter: Payer: Self-pay | Admitting: Neurology

## 2017-09-30 VITALS — BP 116/71 | HR 101 | Ht 61.0 in | Wt 114.0 lb

## 2017-09-30 DIAGNOSIS — G35 Multiple sclerosis: Secondary | ICD-10-CM | POA: Diagnosis not present

## 2017-09-30 DIAGNOSIS — G8250 Quadriplegia, unspecified: Secondary | ICD-10-CM

## 2017-09-30 MED ORDER — INCOBOTULINUMTOXINA 100 UNITS IM SOLR
500.0000 [IU] | INTRAMUSCULAR | Status: DC
  Administered 2017-09-30: 500 [IU] via INTRAMUSCULAR

## 2017-09-30 NOTE — Progress Notes (Signed)
**  Xeomin 100 units x 5 vials, NDC 0259-1610-01, Lot [id number], Exp 10/2019, office supply.//mck,rn**

## 2017-09-30 NOTE — Progress Notes (Signed)
PATIENT: Amy Bray DOB: 1945/04/01    HISTORICAL  Amy Bray is a 72 years old right-handed female , seen in refer by my colleague Dr. Leta Baptist for evaluation of EMG guided botulism toxin injection for bilateral lower extremity spasticity, gait abnormality, initial evaluation was on September 11 2016.   This is the first time I have seen this patient, I also performed electrical stimulation guided xeomin injection for spastic paraplegia, I used 400 units of xeomin on September 11 2016  She presented with right leg stiffness, spasm, weakness in 2002, was diagnosed with cervical spine disease, had anterior cervical vasectomy and fusion in 2003. Following the surgery, her right leg weakness and spasms significantly improved, however a year later the symptoms returned, she ultimately had second cervical spine surgery in 2006, following second surgery, she only has mild improvement of her symptoms,  Over the years, she continue have progressive worsening spastic bilateral lower extremity, gait abnormality, eventually leading to this diagnosis of relapsing remitting multiple sclerosis,  She was treated with [facility] over the past few years, her symptoms has been steady.  We have personally reviewed most recent MRI cervical spine in September 2017, 3 small T2 hyper intensity foci within the spinal cord, adjacent to C5-6 to the left, adjacent to C6-7 to the right, and adjacent to T2, no change compared to previous scan in 2015, evidence of surgical fusion from C3-C6.  MRI of brain showed few scattered T2/FLAIR hyperintensity foci in the subcortical deep matter of the frontal area,  Since Tysarbri infusion, her symptoms overall has been steady, but over the years, she noticed increased bilateral lower extremity muscle spasticity, especially at nighttime, she described her left joint worsens stomach, jerking movement, while ambulating, she noticed bilateral knees rubbing towards each other,  bilateral leg tends to "worse each other. She rely on her walker now.  Previously she had EMG guided Botox injection at [facility] for bilateral lower extremity spasticity  with very good result  UPDATE Dec 5th 2018: Responded very well to previous injection September 11, 2016, there was no significant side effect noticed, we used 400 units of xeomin, today she wants to emphasize on left lower extremity spasm, hip flexion,  UPDATE March 23 2017: She responded very well to previous xeomin injection 400 units to bilateral lower extremity, we used 600 units total, with emphasized on bilateral hip adduction, flexion  UPDATE June 24 2017: We used 500 units xeomin today, she denies significant side effect with 600 units last time, insurance maximum 500 units  UPDATE Sept 11 2019: She responded well to previous injection but the benefit only last about 3 months, at the end of the 3 months, she developed increased spasticity of bilateral leg, adduction of bilateral leg  REVIEW OF SYSTEMS: Full 14 system review of systems performed and notable only for gait abnormality, back pain   ALLERGIES: No Known Allergies  HOME MEDICATIONS: Current Outpatient Medications  Medication Sig Dispense Refill  . acetaminophen (TYLENOL) 500 MG tablet Take 500 mg by mouth as needed.    . baclofen (LIORESAL) 10 MG tablet Take 2 tablets (20 mg total) by mouth 3 (three) times daily. 180 tablet 12  . Cranberry-Vitamin C-Vitamin E (CRANBERRY PLUS VITAMIN C) 4200-20-3 MG-MG-UNIT CAPS Take by mouth.    . Dalfampridine (4-AMINOPYRIDINE) POWD 5 mg by Does not apply route 3 (three) times daily. 5mg  capsules, one tid    . denosumab (PROLIA) 60 MG/ML SOLN injection Inject 60 mg into the skin  every 6 (six) months. Administer in upper arm, thigh, or abdomen    . incobotulinumtoxinA (XEOMIN) 100 units SOLR injection Inject 500 Units into the muscle every 3 (three) months.    Marland Kitchen lisinopril (PRINIVIL,ZESTRIL) 10 MG tablet Take 1 tablet by  mouth daily.    Marland Kitchen MYRBETRIQ 50 MG TB24 tablet 50 mg.    . natalizumab (TYSABRI) 300 MG/15ML injection Inject into the vein.    Marland Kitchen nortriptyline (PAMELOR) 50 MG capsule Take 1 capsule by mouth daily.    . traMADol-acetaminophen (ULTRACET) 37.5-325 MG per tablet Take 1 tablet by mouth daily as needed.     Current Facility-Administered Medications  Medication Dose Route Frequency Provider Last Rate Last Dose  . incobotulinumtoxinA (XEOMIN) 100 units injection 500 Units  500 Units Intramuscular Q90 days Marcial Pacas, MD   500 Units at 06/24/17 1408    PAST MEDICAL HISTORY: Past Medical History:  Diagnosis Date  . Cervical myelopathy ([place])   . Multiple sclerosis ([place])   . Osteoporosis 2018   hip    PAST SURGICAL HISTORY: Past Surgical History:  Procedure Laterality Date  . CERVICAL FUSION  01/2001, 01/2004   C3-C6    FAMILY HISTORY: Family History  Problem Relation Age of Onset  . Transient ischemic attack Mother   . Pancreatic cancer Father   . Breast cancer Cousin     SOCIAL HISTORY:  Social History   Socioeconomic History  . Marital status: Married    Spouse name: Zell Doucette  . Number of children: 0  . Years of education: College  . Highest education level: Not on file  Occupational History    Employer: OTHER    Comment: n/a  Social Needs  . Financial resource strain: Not on file  . Food insecurity:    Worry: Not on file    Inability: Not on file  . Transportation needs:    Medical: Not on file    Non-medical: Not on file  Tobacco Use  . Smoking status: Former Smoker    Packs/day: 1.00    Years: 15.00    Pack years: 15.00    Types: Cigarettes    Last attempt to quit: 01/21/1983    Years since quitting: 34.7  . Smokeless tobacco: Never Used  Substance and Sexual Activity  . Alcohol use: Yes    Alcohol/week: 0.0 standard drinks    Comment: occasionally- wine  . Drug use: No  . Sexual activity: Not on file  Lifestyle  . Physical activity:    Days per  week: Not on file    Minutes per session: Not on file  . Stress: Not on file  Relationships  . Social connections:    Talks on phone: Not on file    Gets together: Not on file    Attends religious service: Not on file    Active member of club or organization: Not on file    Attends meetings of clubs or organizations: Not on file    Relationship status: Not on file  . Intimate partner violence:    Fear of current or ex partner: Not on file    Emotionally abused: Not on file    Physically abused: Not on file    Forced sexual activity: Not on file  Other Topics Concern  . Not on file  Social History Narrative   Patient lives at home with her spouse.   Caffeine Use-none     PHYSICAL EXAM   There were no vitals filed for this  visit.  Not recorded      There is no height or weight on file to calculate BMI.  PHYSICAL EXAMNIATION:  Gen: NAD, conversant, well nourised, obese, well groomed                     Cardiovascular: Regular rate rhythm, no peripheral edema, warm, nontender. Eyes: Conjunctivae clear without exudates or hemorrhage Neck: Supple, no carotid bruits. Pulmonary: Clear to auscultation bilaterally   NEUROLOGICAL EXAM:  MENTAL STATUS: Speech:    Speech is normal; fluent and spontaneous with normal comprehension.  Cognition:     Orientation to time, place and person     Normal recent and remote memory     Normal Attention span and concentration     Normal Language, naming, repeating,spontaneous speech     Fund of knowledge   CRANIAL NERVES: CN II: Visual fields are full to confrontation. Fundoscopic exam is normal with sharp discs and no vascular changes. Pupils are round equal and briskly reactive to light. CN III, IV, VI: extraocular movement are normal. No ptosis. CN V: Facial sensation is intact to pinprick in all 3 divisions bilaterally. Corneal responses are intact.  CN VII: Face is symmetric with normal eye closure and smile. CN VIII: Hearing is  normal to rubbing fingers CN IX, X: Palate elevates symmetrically. Phonation is normal. CN XI: Head turning and shoulder shrug are intact CN XII: Tongue is midline with normal movements and no atrophy.  MOTOR: She has moderate spasticity of bilateral lower extremity, mild to moderate proximal lower extremity weakness,   REFLEXES: Reflexes are 2+ and symmetric at the biceps, triceps, knees, and ankles. Plantar responses Extensor bilaterally  Sensory: Intact to light touch  COORDINATION: Rapid alternating movements and fine finger movements are intact. There is no dysmetria on finger-to-nose and heel-knee-shin.    GAIT/STANCE: She rely on her walker to get up from seated position, significant bilateral adductor muscle spasm, rubbing her knees together while walking, spastic, unsteady slow cautious gait, bilateral flat feet, difficulty to clear bilateral feet from the floor DIAGNOSTIC DATA (LABS, IMAGING, TESTING)  - I reviewed patient records, labs, notes, testing and imaging myself where available.   ASSESSMENT AND PLAN  EULIA HATCHER is a 72 y.o. female   Spastic paraparesis.  Relapsing remitting multiple sclerosis with cervical cord involvement, history of spondylitic cervical myelopathy   Under electric stimulation we have performed xeomin injection today use 500 units  Right adductor longus 100 Right adductor magnus 100 units Right tibialis posterior 25 units Right flexor digitorum longus 25 units  Left adductor longus 100 units Left adductor magnus 100 Right tibialis posterior 25 units Right flexor digitorum longus 25 units  Patient tolerate the injection well, returned to 3 months for repeat injection   Marcial Pacas, M.D. Ph.D.  [facility] [address] [place], [place] [address] Ph: [phone] Fax: [phone]  CC: Hulan Fess, MD

## 2017-10-02 ENCOUNTER — Other Ambulatory Visit: Payer: Self-pay

## 2017-10-02 ENCOUNTER — Encounter: Payer: Self-pay | Admitting: Rehabilitative and Restorative Service Providers"

## 2017-10-02 ENCOUNTER — Ambulatory Visit
Payer: Medicare Other | Attending: Diagnostic Neuroimaging | Admitting: Rehabilitative and Restorative Service Providers"

## 2017-10-02 DIAGNOSIS — R2689 Other abnormalities of gait and mobility: Secondary | ICD-10-CM | POA: Insufficient documentation

## 2017-10-02 DIAGNOSIS — M6289 Other specified disorders of muscle: Secondary | ICD-10-CM | POA: Diagnosis not present

## 2017-10-02 DIAGNOSIS — M6281 Muscle weakness (generalized): Secondary | ICD-10-CM | POA: Insufficient documentation

## 2017-10-02 NOTE — Patient Instructions (Signed)
Access Code: [email or web address]  URL: [email or web address]  Date: 10/02/2017  Prepared by: Rudell Cobb   Exercises Seated Hamstring Stretch with Chair - 3 reps - 1 sets - 30 hold - 2x daily - 7x weekly

## 2017-10-02 NOTE — Therapy (Addendum)
[facility] [address] [place], [place], [address] Phone: [phone]   Fax:  [phone]  Physical Therapy Evaluation  Patient Details  Name: Amy Bray MRN: [id number] Date of Birth: 1945-11-30 Referring Provider: Bess Harvest, MD   Encounter Date: 10/02/2017  PT End of Session - 10/02/17 1304    Visit Number  1    Number of Visits  17    Date for PT Re-Evaluation  12/01/17    Authorization Type  medicare and [employer]ARP    PT Start Time  1103    PT Stop Time  1147    PT Time Calculation (min)  44 min    Activity Tolerance  Patient tolerated treatment well    Behavior During Therapy  [facility] for tasks assessed/performed       Past Medical History:  Diagnosis Date  . Cervical myelopathy ([place])   . Multiple sclerosis ([place])   . Osteoporosis 2018   hip    Past Surgical History:  Procedure Laterality Date  . CERVICAL FUSION  01/2001, 01/2004   C3-C6    There were no vitals filed for this visit.  Subjective Assessment - 10/02/17 1109    Subjective  The patient is known to our clinic from prior physical therapy.  She notes she does prior HEP occasionally, but only the counter standing ones b/c it got too difficult to pull up from the floor (this got harder in the last year).  The patient uses a Marketing executive RW for household and community mobility.  Her daily routine involves freqeunt sitting.    The patient is doing her own ironing and cooking, she now has someone clean the home.    Pertinent History  HTN, cervical myelopathy, 2 prior neck surgeries.      Patient Stated Goals  "Improving balance and picking my feet up."  Specific issues:  getting up from a chair, patient's confidence    Currently in Pain?  Yes    Pain Score  6    "they say it's inflammation"/ she has been getting injections for a long time.    Pain Location  Coccyx    Pain Orientation  Lower    Pain Descriptors / Indicators  Stabbing;Sharp    Pain  Radiating Towards  different sides- can radiate when increased intensity    Pain Onset  More than a month ago    Pain Frequency  Constant    Aggravating Factors   Sitting, walking "it can feel like a knife sticking you"    Pain Relieving Factors  tramadol helps          [facility] PT Assessment - 10/02/17 1116      Assessment   Medical Diagnosis  multiple sclerosis    Referring Provider  Bess Harvest, MD    Onset Date/Surgical Date  --   07/2017   Prior Therapy  known to our clinic from prior PT years ago.      Precautions   Precautions  Fall      Restrictions   Weight Bearing Restrictions  No      Balance Screen   Has the patient fallen in the past 6 months  No    Has the patient had a decrease in activity level because of a fear of falling?   No    Is the patient reluctant to leave their home because of a fear of falling?   No   gets out for appointments  Home Environment   Living Environment  Private residence    Living Arrangements  Spouse/significant other    Type of [place] Access  Level entry    Home Layout  One level    [place] - 4 wheels;Cane - single point   uses cane for short walks     Prior Function   Level of Independence  Independent with household mobility with device      Sensation   Light Touch  Impaired Detail    Light Touch Impaired Details  Impaired RLE;Impaired LLE    Additional Comments  Notes numbness bilat LEs in posterior/ distal aspect, neuropathic stinging/ pain in bilateral feet      Tone   Assessment Location  Right Lower Extremity;Left Lower Extremity      ROM / Strength   AROM / PROM / Strength  AROM;Strength      AROM   Overall AROM Comments  Left ankle ROM - 5-10 degrees (approx) from neutral, R ankle to neutral DF.  Hamstring flexibility -36 degrees from neutral bilaterally (knees and hips beginning 90/90)      Strength   Strength Assessment Site  Hip;Knee;Ankle;Shoulder;Elbow    Right/Left  Shoulder  Right;Left    Right Shoulder Flexion  4/5    Right Shoulder ABduction  4/5    Left Shoulder Flexion  4/5    Left Shoulder ABduction  4/5    Right/Left Elbow  Right;Left    Right Elbow Flexion  5/5    Left Elbow Flexion  5/5    Right/Left Hip  Right;Left    Right Hip Flexion  2/5    Left Hip Flexion  2/5    Right/Left Knee  Right;Left    Right Knee Flexion  4/5    Right Knee Extension  4/5    Left Knee Flexion  5/5    Left Knee Extension  5/5    Right/Left Ankle  Right;Left    Right Ankle Dorsiflexion  3/5    Left Ankle Dorsiflexion  3/5      Ambulation/Gait   Ambulation/Gait  Yes    Ambulation/Gait Assistance  6: Modified independent (Device/Increase time);5: Supervision    Ambulation Distance (Feet)  100 Feet    Assistive device  4-wheeled walker    Gait Pattern  Step-through pattern;Decreased stride length;Decreased step length - right;Decreased step length - left;Poor foot clearance - left;Poor foot clearance - right   has internal rotation of femur   Ambulation Surface  Level;Indoor    Gait velocity  0.54 ft/sec      RLE Tone   RLE Tone  Modified Ashworth      RLE Tone   Modified Ashworth Scale for Grading Hypertonia RLE  More marked increase in muscle tone through most of the ROM, but affected part(s) easily moved   in hamstrings, hip flexors, quads and hip adductors 0     LLE Tone   LLE Tone  Modified Ashworth      LLE Tone   Modified Ashworth Scale for Grading Hypertonia LLE  More marked increase in muscle tone through most of the ROM, but affected part(s) easily moved   in hamstrings, hip flexors, quads and hip adductors 0                          PT Education - 10/02/17 1303    Education Details  HEP: hamstring stretching  Person(s) Educated  Patient    Methods  Explanation;Demonstration;Handout    Comprehension  Verbalized understanding;Returned demonstration       PT Short Term Goals - 10/02/17 1305      PT SHORT TERM  GOAL #1   Title  The patient will return demo HEP for LE strengthening, stretching, and general balance/mobility. (STG date 11/01/2017)    Time  4    Period  Weeks    Target Date  11/01/17      PT SHORT TERM GOAL #2   Title  Perform Berg balance test with LTG to follow.     Time  4    Period  Weeks    Target Date  11/01/17      PT SHORT TERM GOAL #3   Title  The patient will improve gait speed from 0.54 ft/sec to > or equal to 0.8 ft/sec to demo improving mobility.    Time  4    Period  Weeks    Target Date  11/01/17      PT SHORT TERM GOAL #4   Title  Further assess/ trial AFOs in clinic for bilateral knee control and foot drop.    Time  4    Period  Weeks    Target Date  11/01/17        PT Long Term Goals - 10/02/17 1307      PT LONG TERM GOAL #1   Title  The patient will return demo progression of HEP for post d/c routine. (LTG due 12/01/2017)    Time  8    Period  Weeks    Target Date  12/01/17      PT LONG TERM GOAL #2   Title  The patient will improve gait speed from 0.54 ft/sec to > or equal to 1.2 ft/sec to demo improving household ambulation/ gait efficiency.    Time  8    Period  Weeks    Target Date  12/01/17      PT LONG TERM GOAL #3   Title  The patient will ambulate >300 ft nonstop with rollator RW mod indep for limited community mobility (walking into/out of appointments).      Time  8    Period  Weeks    Target Date  12/01/17      PT LONG TERM GOAL #4   Title  The patient will move sit<>stand 5/5 trial with UE support without requiring multiple attempts.     Time  8    Period  Weeks    Target Date  12/01/17      PT LONG TERM GOAL #5   Title  The patient will be able to lift LEs into car without use of UE support to demo improving hip flexion strength.    Time  8    Target Date  12/01/17      Additional Long Term Goals   Additional Long Term Goals  Yes      PT LONG TERM GOAL #6   Title  Berg goal to follow.    Time  8    Period  Weeks     Target Date  12/01/17            Plan - 10/02/17 1310    Clinical Impression Statement  The patient is a 72 yo female known to our clinic from prior PT for multiple sclerosis, gait abnormality and LE weakness.  The patient has significantly slowed gait speed to 0.54  ft/sec indicating high fall risk and limiting her ability to ambulate efficiently in home.  LE weakness and increased muscle tone limit gait mechanics.  PT to further assess balance measures and hip abductor strength.     History and Personal Factors relevant to plan of care:  HTN, cervical myelopathy, 2 prior neck surgeries.      Clinical Presentation  Stable    Clinical Presentation due to:  chronic nature of deficits    Clinical Decision Making  Low    Rehab Potential  Good    PT Frequency  2x / week   eval   PT Duration  8 weeks    PT Treatment/Interventions  ADLs/Self Care Home Management;Balance training;Neuromuscular re-education;Patient/family education;Gait training;Stair training;Functional mobility training;Therapeutic activities;Therapeutic exercise;Manual techniques;DME Instruction    PT Next Visit Plan  Berg, hip abductor testing, home program (check hamstring stretch, heel cord stretching, LE strengthening)    Consulted and Agree with Plan of Care  Patient       Patient will benefit from skilled therapeutic intervention in order to improve the following deficits and impairments:  Abnormal gait, Impaired sensation, Impaired tone, Decreased activity tolerance, Decreased strength, Pain, Decreased balance, Decreased mobility, Difficulty walking, Increased muscle spasms, Decreased range of motion, Impaired flexibility  Visit Diagnosis: Other abnormalities of gait and mobility  Muscle weakness (generalized)  Muscle hypertonicity     Problem List Patient Active Problem List   Diagnosis Date Noted  . Quadriplegia, unspecified ([place]) 12/24/2016  . Spastic paresis 09/11/2016  . Multiple sclerosis ([place])  09/11/2016  . Muscle spasticity 10/31/2013  . Nerve root pain 01/31/2013  . Cervical myelopathy ([place]) 05/05/2011  . BP (high blood pressure) 05/05/2011  . Restless leg 05/05/2011  . Arthralgia, sacroiliac 05/05/2011    Lavonne Cass, PT 10/02/2017, 5:03 PM  [facility] [address] [place], [place], [address] Phone: [phone]   Fax:  [phone]  Name: KYSHA MURALLES MRN: [id number] Date of Birth: 07-13-1945

## 2017-10-07 ENCOUNTER — Ambulatory Visit: Payer: Medicare Other | Admitting: Physical Therapy

## 2017-10-07 ENCOUNTER — Encounter: Payer: Self-pay | Admitting: Physical Therapy

## 2017-10-07 DIAGNOSIS — R2689 Other abnormalities of gait and mobility: Secondary | ICD-10-CM

## 2017-10-07 DIAGNOSIS — M6289 Other specified disorders of muscle: Secondary | ICD-10-CM | POA: Diagnosis not present

## 2017-10-07 DIAGNOSIS — M6281 Muscle weakness (generalized): Secondary | ICD-10-CM | POA: Diagnosis not present

## 2017-10-07 NOTE — Therapy (Signed)
[facility] [address] [place], [place], [address] Phone: [phone]   Fax:  [phone]  Physical Therapy Treatment  Patient Details  Name: Amy Bray MRN: [id number] Date of Birth: 1945/04/13 Referring Provider: Bess Harvest, MD   Encounter Date: 10/07/2017  PT End of Session - 10/07/17 1153    Visit Number  2    Number of Visits  17    Date for PT Re-Evaluation  12/01/17    Authorization Type  medicare and [employer]    PT Start Time  1146    PT Stop Time  1228    PT Time Calculation (min)  42 min    Activity Tolerance  Patient tolerated treatment well    Behavior During Therapy  [facility] for tasks assessed/performed       Past Medical History:  Diagnosis Date  . Cervical myelopathy ([place])   . Multiple sclerosis ([place])   . Osteoporosis 2018   hip    Past Surgical History:  Procedure Laterality Date  . CERVICAL FUSION  01/2001, 01/2004   C3-C6    There were no vitals filed for this visit.  Subjective Assessment - 10/07/17 1151    Subjective  has been doing HS stretches for 30 sec x 3-4 times/day with good tolerance; wants to eventually trial floor to stand transfers    Pertinent History  HTN, cervical myelopathy, 2 prior neck surgeries.      Patient Stated Goals  "Improving balance and picking my feet up."  Specific issues:  getting up from a chair, patient's confidence    Currently in Pain?  Yes    Pain Score  9    has gotten progressively worse during the day.    Pain Location  Coccyx    Pain Orientation  Lower    Pain Descriptors / Indicators  Sharp;Stabbing    Pain Type  Chronic pain         [facility] PT Assessment - 10/07/17 0001      Balance   Balance Assessed  Yes      Standardized Balance Assessment   Standardized Balance Assessment  Berg Balance Test      Berg Balance Test   Sit to Stand  Able to stand  independently using hands    Standing Unsupported  Able to stand safely 2 minutes     Sitting with Back Unsupported but Feet Supported on Floor or Stool  Able to sit safely and securely 2 minutes    Stand to Sit  Uses backs of legs against chair to control descent    Transfers  Able to transfer safely, definite need of hands    Standing Unsupported with Eyes Closed  Able to stand 10 seconds with supervision    Standing Ubsupported with Feet Together  Able to place feet together independently but unable to hold for 30 seconds    From Standing, Reach Forward with Outstretched Arm  Can reach forward >5 cm safely (2")    From Standing Position, Pick up Object from Floor  Able to pick up shoe, needs supervision    From Standing Position, Turn to Look Behind Over each Shoulder  Turn sideways only but maintains balance    Turn 360 Degrees  Needs assistance while turning    Standing Unsupported, Alternately Place Feet on Step/Stool  Needs assistance to keep from falling or unable to try    Standing Unsupported, One Foot in Front  Needs help to step but can  hold 15 seconds    Standing on One Leg  Tries to lift leg/unable to hold 3 seconds but remains standing independently    Total Score  30                   [facility] Adult PT Treatment/Exercise - 10/07/17 0001      Transfers   Transfers  Sit to Stand    Sit to Stand  4: Min assist;4: Min guard    Sit to Stand Details  Visual cues/gestures for sequencing;Verbal cues for sequencing;Tactile cues for sequencing;Tactile cues for weight shifting;Manual facilitation for weight shifting    Sit to Stand Details (indicate cue type and reason)  cueing for forward shifting and trunk flexion; heacy LE adduction during transfers; min assist for forward weight shifting of hips - prefers a posterior pelvic tilt throughout    Number of Reps  --   15 reps - B UE support     Ambulation/Gait   Ambulation/Gait  Yes    Ambulation/Gait Assistance  5: Supervision;6: Modified independent (Device/Increase time)    Ambulation/Gait Assistance  Details  verbal cueing for improved toe clearance with increased hip/knee flexion; verbal cueing for safety with use of brakes prior to sit/stand    Ambulation Distance (Feet)  --   waiting room to/from gym   Assistive device  4-wheeled walker    Gait Pattern  Step-through pattern;Decreased stride length;Decreased step length - right;Decreased step length - left;Poor foot clearance - left;Poor foot clearance - right   internal rotation of femur with adduction   Ambulation Surface  Level;Indoor          Balance Exercises - 10/07/17 1246      Balance Exercises: Standing   Sidestepping  Upper extremity support;4 reps   in // bars with UE support; Min A for hip positioning   Marching Limitations  marching in place to improve hip/knee flexion heihgt for toe clearance - compensates throughout; march walking in // bars x 2 laps with B UE support for carryover into gait    Other Standing Exercises  hip abduction x 10 each side with emphasis on posture and reducing compensations of lateral trunk lean          PT Short Term Goals - 10/07/17 1250      PT SHORT TERM GOAL #1   Title  The patient will return demo HEP for LE strengthening, stretching, and general balance/mobility. (STG date 11/01/2017)    Time  4    Period  Weeks      PT SHORT TERM GOAL #2   Title  Perform Berg balance test with LTG to follow.     Baseline  10/07/17: 30/56    Time  4    Period  Weeks      PT SHORT TERM GOAL #3   Title  The patient will improve gait speed from 0.54 ft/sec to > or equal to 0.8 ft/sec to demo improving mobility.    Time  4    Period  Weeks      PT SHORT TERM GOAL #4   Title  Further assess/ trial AFOs in clinic for bilateral knee control and foot drop.    Time  4    Period  Weeks        PT Long Term Goals - 10/07/17 1251      PT LONG TERM GOAL #1   Title  The patient will return demo progression of HEP for post d/c  routine. (LTG due 12/01/2017)    Time  8    Period  Weeks       PT LONG TERM GOAL #2   Title  The patient will improve gait speed from 0.54 ft/sec to > or equal to 1.2 ft/sec to demo improving household ambulation/ gait efficiency.    Time  8    Period  Weeks      PT LONG TERM GOAL #3   Title  The patient will ambulate >300 ft nonstop with rollator RW mod indep for limited community mobility (walking into/out of appointments).      Time  8    Period  Weeks      PT LONG TERM GOAL #4   Title  The patient will move sit<>stand 5/5 trial with UE support without requiring multiple attempts.     Time  8    Period  Weeks      PT LONG TERM GOAL #5   Title  The patient will be able to lift LEs into car without use of UE support to demo improving hip flexion strength.    Time  8      PT LONG TERM GOAL #6   Title  patient to improve Berg to >/= 38/56 demonstrating reduced fall risk    Time  8    Period  Weeks            Plan - 10/07/17 1253    Clinical Impression Statement  Patient doing well - reports good compliance with stretching from last session. Patient with reduced safety with rollator today with PT providing education and cueing for use of brakes with good carryover. Assessed Berg Balance today with patient scoring 30/56 demonstrating high fall risk. PT session focusing in initiating HEP for strengthening/balance as well as improvements with sit to stand as patient relies heavily on UE as well as LE on back of chair. Will continue to progress towards established goals.     Rehab Potential  Good    PT Frequency  2x / week   eval   PT Duration  8 weeks    PT Treatment/Interventions  ADLs/Self Care Home Management;Balance training;Neuromuscular re-education;Patient/family education;Gait training;Stair training;Functional mobility training;Therapeutic activities;Therapeutic exercise;Manual techniques;DME Instruction    PT Next Visit Plan  continue to progress strengthening, balance, gait, transfers - wants to try floor to stand at some point; HEP  [id number]    Consulted and Agree with Plan of Care  Patient       Patient will benefit from skilled therapeutic intervention in order to improve the following deficits and impairments:  Abnormal gait, Impaired sensation, Impaired tone, Decreased activity tolerance, Decreased strength, Pain, Decreased balance, Decreased mobility, Difficulty walking, Increased muscle spasms, Decreased range of motion, Impaired flexibility  Visit Diagnosis: Other abnormalities of gait and mobility  Muscle weakness (generalized)  Muscle hypertonicity     Problem List Patient Active Problem List   Diagnosis Date Noted  . Quadriplegia, unspecified ([place]) 12/24/2016  . Spastic paresis 09/11/2016  . Multiple sclerosis ([place]) 09/11/2016  . Muscle spasticity 10/31/2013  . Nerve root pain 01/31/2013  . Cervical myelopathy ([place]) 05/05/2011  . BP (high blood pressure) 05/05/2011  . Restless leg 05/05/2011  . Arthralgia, sacroiliac 05/05/2011    Lanney Gins, PT, DPT Supplemental Physical Therapist 10/07/17 12:57 PM Pager: [phone] Office: [facility] [address] [place], [place], [address] Phone: [phone]   Fax:  [phone]  Name: Amy Vales  Bray MRN: [id number] Date of Birth: [birth year]

## 2017-10-09 ENCOUNTER — Encounter: Payer: Self-pay | Admitting: Rehabilitative and Restorative Service Providers"

## 2017-10-09 ENCOUNTER — Ambulatory Visit: Payer: Medicare Other | Admitting: Rehabilitative and Restorative Service Providers"

## 2017-10-09 DIAGNOSIS — R2689 Other abnormalities of gait and mobility: Secondary | ICD-10-CM | POA: Diagnosis not present

## 2017-10-09 DIAGNOSIS — M6281 Muscle weakness (generalized): Secondary | ICD-10-CM

## 2017-10-09 DIAGNOSIS — M6289 Other specified disorders of muscle: Secondary | ICD-10-CM | POA: Diagnosis not present

## 2017-10-09 NOTE — Therapy (Signed)
[facility] [address] [place], [place], [address] Phone: [phone]   Fax:  [phone]  Physical Therapy Treatment  Patient Details  Name: Amy Bray MRN: [id number] Date of Birth: [birth year] Referring Provider: Bess Harvest, MD   Encounter Date: 10/09/2017  PT End of Session - 10/09/17 1200    Visit Number  3    Number of Visits  17    Date for PT Re-Evaluation  12/01/17    Authorization Type  medicare and [employer]ARP    PT Start Time  1103    PT Stop Time  1148    PT Time Calculation (min)  45 min    Activity Tolerance  Patient tolerated treatment well    Behavior During Therapy  [facility] for tasks assessed/performed       Past Medical History:  Diagnosis Date  . Cervical myelopathy ([place])   . Multiple sclerosis ([place])   . Osteoporosis 2018   hip    Past Surgical History:  Procedure Laterality Date  . CERVICAL FUSION  01/2001, 01/2004   C3-C6    There were no vitals filed for this visit.  Subjective Assessment - 10/09/17 1106    Subjective  The patient notes she did exercises at home and has a hard time lifting her leg.    Pertinent History  HTN, cervical myelopathy, 2 prior neck surgeries.      Patient Stated Goals  "Improving balance and picking my feet up."  Specific issues:  getting up from a chair, patient's confidence    Currently in Pain?  Yes    Pain Score  5     Pain Location  Coccyx    Pain Orientation  Lower    Pain Descriptors / Indicators  Aching    Pain Type  Chronic pain    Pain Onset  More than a month ago    Pain Frequency  Constant    Aggravating Factors   sitting, walking    Pain Relieving Factors  tramadol helps         [facility] PT Assessment - 10/09/17 1123      Strength   Strength Assessment Site  Hip    Right/Left Hip  Right;Left    Right Hip ABduction  3-/5    Left Hip ABduction  3/5                   [facility] Adult PT Treatment/Exercise - 10/09/17 1123      Ambulation/Gait   Ambulation/Gait  Yes    Ambulation/Gait Assistance  5: Supervision    Ambulation Distance (Feet)  115 Feet   x2   Assistive device  4-wheeled walker    Gait Pattern  Step-through pattern;Decreased stride length;Decreased step length - right;Decreased step length - left;Poor foot clearance - left;Poor foot clearance - right    Gait Comments  tactile cues and verbal cues for bilateral heel strike and longer stride, also cues for wider base of suppport      Neuro Re-ed    Neuro Re-ed Details   Standing partial stride position working on hip anterior/posterior rocking.      Exercises   Exercises  Knee/Hip      Knee/Hip Exercises: Seated   Marching  Right;Left;10 reps    Marching Limitations  performed at corner of mat table working on wide leg marching with core engagement       Knee/Hip Exercises: Supine   Other Supine Knee/Hip Exercises  Knee flexion  on/off mat x 5 reps with tactile cues      Knee/Hip Exercises: Prone   Hamstring Curl  10 reps    Hamstring Curl Limitations  Stretched quadriceps bilaterally before performing    Hip Extension  AROM;Right;Left    Hip Extension Limitations  attempted to roll to accomplish movement, therefore modified to gluteal sets.    Other Prone Exercises  Quadriped obtained with min A.  Performed hip extension x 5 reps each (not able to lift the left leg as high as the right side); needed cues and occasional assist.    Other Prone Exercises  Quadriped heel sitting               PT Short Term Goals - 10/07/17 1250      PT SHORT TERM GOAL #1   Title  The patient will return demo HEP for LE strengthening, stretching, and general balance/mobility. (STG date 11/01/2017)    Time  4    Period  Weeks      PT SHORT TERM GOAL #2   Title  Perform Berg balance test with LTG to follow.     Baseline  10/07/17: 30/56    Time  4    Period  Weeks      PT SHORT TERM GOAL #3   Title  The patient will improve gait speed from 0.54  ft/sec to > or equal to 0.8 ft/sec to demo improving mobility.    Time  4    Period  Weeks      PT SHORT TERM GOAL #4   Title  Further assess/ trial AFOs in clinic for bilateral knee control and foot drop.    Time  4    Period  Weeks        PT Long Term Goals - 10/07/17 1251      PT LONG TERM GOAL #1   Title  The patient will return demo progression of HEP for post d/c routine. (LTG due 12/01/2017)    Time  8    Period  Weeks      PT LONG TERM GOAL #2   Title  The patient will improve gait speed from 0.54 ft/sec to > or equal to 1.2 ft/sec to demo improving household ambulation/ gait efficiency.    Time  8    Period  Weeks      PT LONG TERM GOAL #3   Title  The patient will ambulate >300 ft nonstop with rollator RW mod indep for limited community mobility (walking into/out of appointments).      Time  8    Period  Weeks      PT LONG TERM GOAL #4   Title  The patient will move sit<>stand 5/5 trial with UE support without requiring multiple attempts.     Time  8    Period  Weeks      PT LONG TERM GOAL #5   Title  The patient will be able to lift LEs into car without use of UE support to demo improving hip flexion strength.    Time  8      PT LONG TERM GOAL #6   Title  patient to improve Berg to >/= 38/56 demonstrating reduced fall risk    Time  8    Period  Weeks            Plan - 10/09/17 1203    Clinical Impression Statement  The patient was able to carryover longer stride  length, wider base of support and faster gait pattern at end of session.  PT to continue working on LE strengthening, standing balance/control, and gait for improved functional mobility.    PT Treatment/Interventions  ADLs/Self Care Home Management;Balance training;Neuromuscular re-education;Patient/family education;Gait training;Stair training;Functional mobility training;Therapeutic activities;Therapeutic exercise;Manual techniques;DME Instruction    PT Next Visit Plan  continue to progress  strengthening, balance, gait, transfers - wants to try floor to stand at some point; HEP [id number]    Consulted and Agree with Plan of Care  Patient       Patient will benefit from skilled therapeutic intervention in order to improve the following deficits and impairments:  Abnormal gait, Impaired sensation, Impaired tone, Decreased activity tolerance, Decreased strength, Pain, Decreased balance, Decreased mobility, Difficulty walking, Increased muscle spasms, Decreased range of motion, Impaired flexibility  Visit Diagnosis: Other abnormalities of gait and mobility  Muscle weakness (generalized)  Muscle hypertonicity     Problem List Patient Active Problem List   Diagnosis Date Noted  . Quadriplegia, unspecified ([place]) 12/24/2016  . Spastic paresis 09/11/2016  . Multiple sclerosis ([place]) 09/11/2016  . Muscle spasticity 10/31/2013  . Nerve root pain 01/31/2013  . Cervical myelopathy ([place]) 05/05/2011  . BP (high blood pressure) 05/05/2011  . Restless leg 05/05/2011  . Arthralgia, sacroiliac 05/05/2011    Lancer Thurner, PT 10/09/2017, 12:07 PM  [facility] [address] [place], [place], [address] Phone: [phone]   Fax:  [phone]  Name: TONITA BILLS MRN: [id number] Date of Birth: 11/27/1945

## 2017-10-13 ENCOUNTER — Encounter: Payer: Self-pay | Admitting: Rehabilitation

## 2017-10-13 ENCOUNTER — Ambulatory Visit: Payer: Medicare Other | Admitting: Rehabilitation

## 2017-10-13 DIAGNOSIS — M6289 Other specified disorders of muscle: Secondary | ICD-10-CM | POA: Diagnosis not present

## 2017-10-13 DIAGNOSIS — M6281 Muscle weakness (generalized): Secondary | ICD-10-CM | POA: Diagnosis not present

## 2017-10-13 DIAGNOSIS — R2689 Other abnormalities of gait and mobility: Secondary | ICD-10-CM | POA: Diagnosis not present

## 2017-10-13 NOTE — Therapy (Signed)
[facility] [address] [place], [place], [address] Phone: [phone]   Fax:  [phone]  Physical Therapy Treatment  Patient Details  Name: Amy Bray MRN: [id number] Date of Birth: 21-Feb-1945 Referring Provider: Bess Harvest, MD   Encounter Date: 10/13/2017  PT End of Session - 10/13/17 1549    Visit Number  4    Number of Visits  17    Date for PT Re-Evaluation  12/01/17    Authorization Type  medicare and [employer]ARP    PT Start Time  1103    PT Stop Time  1145    PT Time Calculation (min)  42 min    Activity Tolerance  Patient tolerated treatment well    Behavior During Therapy  [facility] for tasks assessed/performed       Past Medical History:  Diagnosis Date  . Cervical myelopathy ([place])   . Multiple sclerosis ([place])   . Osteoporosis 2018   hip    Past Surgical History:  Procedure Laterality Date  . CERVICAL FUSION  01/2001, 01/2004   C3-C6    There were no vitals filed for this visit.  Subjective Assessment - 10/13/17 1107    Subjective  Pt continues to report coccyx pain today.  No other changes.     Pertinent History  HTN, cervical myelopathy, 2 prior neck surgeries.      Patient Stated Goals  "Improving balance and picking my feet up."  Specific issues:  getting up from a chair, patient's confidence    Currently in Pain?  Yes    Pain Score  5     Pain Location  Coccyx    Pain Orientation  Lower    Pain Descriptors / Indicators  Aching    Pain Onset  More than a month ago    Pain Frequency  Constant    Aggravating Factors   sitting, walking    Pain Relieving Factors  tramadol helps.                        [place] Adult PT Treatment/Exercise - 10/13/17 1105      Ambulation/Gait   Ambulation/Gait  Yes    Ambulation/Gait Assistance  5: Supervision    Ambulation/Gait Assistance Details  Pt ambulatory from lobby into treatment area with rollator and B foot up brace, however note  that R foot up brace not attached.  Pt reports that toes remained curled and that attaching R foot up brace increases this and causes pain.  Provided cues for more upright posture and improved stride length.  Also continue to provide cues for safety with use of rollator brakes when sitting/standing.     Ambulation Distance (Feet)  100 Feet    Assistive device  4-wheeled walker    Gait Pattern  Step-through pattern;Decreased stride length;Decreased step length - right;Decreased step length - left;Poor foot clearance - left;Poor foot clearance - right    Ambulation Surface  Level;Indoor      Self-Care   Self-Care  Other Self-Care Comments    Other Self-Care Comments   Provided pt with education regarding possible aquatic PT and benefits of participation, esp since she has done aquatic classes in the past and is interested.  Also discussed possible need for B AFOs due to the fact that foot up braces are not fully assisting with clearance and pt is maintaining a crouched gait pattern.  Feel that we may be limited in clinic due to  pts small foot size, but could initiate getting order for AFOs.  Pt verbalized understanding.  She reports she had a walk aide in the past, but now it just causes too much pain and that she does not get full contraction when it is on.  Educated on progression of MS as it relates to nerve conduction.  Pt verbalized understanding.        Neuro Re-ed    Neuro Re-ed Details   Had pt attempt to perform sit<>stand from mat with forward BUE reaching to increase forward trunk flexion and allow increased LE activation.  PT unable to do therefore modified activity to having her place BUE on arm rest of chair placed anteriorly to pt and lift buttocks slightly off of mat.  She is able to do with max cues however note that she continues to demonstrate increased hip adduction/IR during task to assist with stabilization and due to increased tone.  Performed lower trunk rotation during session x 10  reps with 3-5 sec holds in order to assist with tone reduction-added to HEP      Exercises   Exercises  Other Exercises    Other Exercises   Performed hooklying hip adductor stretch x 2 reps of 30 secs-added to HEP, standing calf stretch (runner's stretch) x 1 set of 30 secs on each side (cued pt and assisted for correct foot placement with cues that she may need to ask husband to assist with this at home), SL clam x 10 reps on each side-added to HEP         Access Code: [email or web address]  URL: [email or web address]  Date: 10/13/2017  Prepared by: Cameron Sprang   Exercises  Seated Hamstring Stretch with Chair - 3 reps - 1 sets - 30 hold - 2x daily - 7x weekly  Clamshell - 10 reps - 1 sets - 1x daily - 7x weekly  Supine Hip Adductor Stretch - 3 reps - 1 sets - 30 hold - 1x daily - 7x weekly  Supine Lower Trunk Rotation - 10 reps - 1 sets - 5 hold - 1x daily - 7x weekly  Soleus Stretch on Wall - 2 reps - 1 sets - 30 hold - 1x daily - 7x weekly      PT Education - 10/13/17 1548    Education Details  HEP additional exercises    Person(s) Educated  Patient    Methods  Explanation;Demonstration;Handout    Comprehension  Verbalized understanding;Returned demonstration       PT Short Term Goals - 10/07/17 1250      PT SHORT TERM GOAL #1   Title  The patient will return demo HEP for LE strengthening, stretching, and general balance/mobility. (STG date 11/01/2017)    Time  4    Period  Weeks      PT SHORT TERM GOAL #2   Title  Perform Berg balance test with LTG to follow.     Baseline  10/07/17: 30/56    Time  4    Period  Weeks      PT SHORT TERM GOAL #3   Title  The patient will improve gait speed from 0.54 ft/sec to > or equal to 0.8 ft/sec to demo improving mobility.    Time  4    Period  Weeks      PT SHORT TERM GOAL #4   Title  Further assess/ trial AFOs in clinic for bilateral knee control and foot drop.    Time  4  Period  Weeks        PT Long Term Goals  - 10/07/17 1251      PT LONG TERM GOAL #1   Title  The patient will return demo progression of HEP for post d/c routine. (LTG due 12/01/2017)    Time  8    Period  Weeks      PT LONG TERM GOAL #2   Title  The patient will improve gait speed from 0.54 ft/sec to > or equal to 1.2 ft/sec to demo improving household ambulation/ gait efficiency.    Time  8    Period  Weeks      PT LONG TERM GOAL #3   Title  The patient will ambulate >300 ft nonstop with rollator RW mod indep for limited community mobility (walking into/out of appointments).      Time  8    Period  Weeks      PT LONG TERM GOAL #4   Title  The patient will move sit<>stand 5/5 trial with UE support without requiring multiple attempts.     Time  8    Period  Weeks      PT LONG TERM GOAL #5   Title  The patient will be able to lift LEs into car without use of UE support to demo improving hip flexion strength.    Time  8      PT LONG TERM GOAL #6   Title  patient to improve Berg to >/= 38/56 demonstrating reduced fall risk    Time  8    Period  Weeks            Plan - 10/13/17 1549    Clinical Impression Statement  Skilled session focused on adding exercises to HEP for LE flexibility and tone reduction along with hip abduction strengthening.  PT provided education regarding possible aquatic PT and possible need for B AFOs.      PT Treatment/Interventions  ADLs/Self Care Home Management;Balance training;Neuromuscular re-education;Patient/family education;Gait training;Stair training;Functional mobility training;Therapeutic activities;Therapeutic exercise;Manual techniques;DME Instruction    PT Next Visit Plan  follow up with aquatic PT (would have to add to POC), trial AFOs if able due to small foot size? continue to progress strengthening, balance, gait, transfers - wants to try floor to stand at some point; HEP [id number]    Consulted and Agree with Plan of Care  Patient       Patient will benefit from skilled  therapeutic intervention in order to improve the following deficits and impairments:  Abnormal gait, Impaired sensation, Impaired tone, Decreased activity tolerance, Decreased strength, Pain, Decreased balance, Decreased mobility, Difficulty walking, Increased muscle spasms, Decreased range of motion, Impaired flexibility  Visit Diagnosis: Other abnormalities of gait and mobility  Muscle weakness (generalized)  Muscle hypertonicity     Problem List Patient Active Problem List   Diagnosis Date Noted  . Quadriplegia, unspecified ([place]) 12/24/2016  . Spastic paresis 09/11/2016  . Multiple sclerosis ([place]) 09/11/2016  . Muscle spasticity 10/31/2013  . Nerve root pain 01/31/2013  . Cervical myelopathy ([place]) 05/05/2011  . BP (high blood pressure) 05/05/2011  . Restless leg 05/05/2011  . Arthralgia, sacroiliac 05/05/2011    Cameron Sprang, PT, MPT [facility] [address] [place], [place], [address] Phone: [phone]   Fax:  [phone] 10/13/17, 3:52 PM  Name: Amy Bray MRN: [id number] Date of Birth: [birth year]

## 2017-10-13 NOTE — Patient Instructions (Signed)
Access Code: [email or web address]  URL: [email or web address]  Date: 10/13/2017  Prepared by: Cameron Sprang   Exercises  Seated Hamstring Stretch with Chair - 3 reps - 1 sets - 30 hold - 2x daily - 7x weekly  Clamshell - 10 reps - 1 sets - 1x daily - 7x weekly  Supine Hip Adductor Stretch - 3 reps - 1 sets - 30 hold - 1x daily - 7x weekly  Supine Lower Trunk Rotation - 10 reps - 1 sets - 5 hold - 1x daily - 7x weekly  Soleus Stretch on Wall - 2 reps - 1 sets - 30 hold - 1x daily - 7x weekly

## 2017-10-14 ENCOUNTER — Ambulatory Visit: Payer: Medicare Other | Admitting: Rehabilitative and Restorative Service Providers"

## 2017-10-14 ENCOUNTER — Encounter: Payer: Self-pay | Admitting: Rehabilitative and Restorative Service Providers"

## 2017-10-14 DIAGNOSIS — R2689 Other abnormalities of gait and mobility: Secondary | ICD-10-CM

## 2017-10-14 DIAGNOSIS — M6281 Muscle weakness (generalized): Secondary | ICD-10-CM

## 2017-10-14 DIAGNOSIS — M6289 Other specified disorders of muscle: Secondary | ICD-10-CM

## 2017-10-14 NOTE — Patient Instructions (Signed)
Access Code: [email or web address]  URL: [email or web address]  Date: 10/14/2017  Prepared by: Rudell Cobb   Exercises Seated Hamstring Stretch with Chair - 3 reps - 1 sets - 30 hold - 2x daily - 7x weekly Clamshell - 10 reps - 1 sets - 1x daily - 7x weekly Supine Hip Adductor Stretch - 3 reps - 1 sets - 30 hold - 1x daily - 7x weekly Supine Lower Trunk Rotation - 10 reps - 1 sets - 5 hold - 1x daily - 7x weekly Soleus Stretch on Wall - 2 reps - 1 sets - 30 hold - 1x daily - 7x weekly sitting toe stretch - 2 reps - 1 sets - 30 seconds hold - 2x daily - 7x weekly

## 2017-10-14 NOTE — Therapy (Signed)
[facility] [address] [place], [place], [address] Phone: [phone]   Fax:  [phone]  Physical Therapy Treatment  Patient Details  Name: Amy Bray MRN: [id number] Date of Birth: May 11, 1945 Referring Provider: Bess Harvest, MD   Encounter Date: 10/14/2017  PT End of Session - 10/14/17 1342    Visit Number  5    Number of Visits  17    Date for PT Re-Evaluation  12/01/17    Authorization Type  medicare and AARP    PT Start Time  1320    PT Stop Time  1400    PT Time Calculation (min)  40 min    Activity Tolerance  Patient tolerated treatment well    Behavior During Therapy  [facility] for tasks assessed/performed       Past Medical History:  Diagnosis Date  . Cervical myelopathy ([place])   . Multiple sclerosis ([place])   . Osteoporosis 2018   hip    Past Surgical History:  Procedure Laterality Date  . CERVICAL FUSION  01/2001, 01/2004   C3-C6    There were no vitals filed for this visit.  Subjective Assessment - 10/14/17 1328    Subjective  The patient reports that she has tried metal AFO in the past and couldn't walk with it donned.  She has been using the foot up braces since 2017.  She has also tried walk aide, but that became uncomfortable.     Pertinent History  HTN, cervical myelopathy, 2 prior neck surgeries.      Patient Stated Goals  "Improving balance and picking my feet up."  Specific issues:  getting up from a chair, patient's confidence    Currently in Pain?  Yes    Pain Score  --   not as bad today   Pain Location  Coccyx    Pain Orientation  Lower    Pain Descriptors / Indicators  Aching    Pain Type  Chronic pain    Pain Onset  More than a month ago    Pain Frequency  Constant    Aggravating Factors   sitting, walking    Pain Relieving Factors  tramadol helps                       [facility] Adult PT Treatment/Exercise - 10/14/17 1338      Ambulation/Gait   Ambulation/Gait  Yes    Ambulation/Gait Assistance  5: Supervision    Ambulation/Gait Assistance Details  Patient notes she does not wear foot up braces in the home --  just when walking out of the house.  She reports trying multiple types of braces, but feels the R toe flexion hinders use (her toes pull into flexion against brace).      Ambulation Distance (Feet)  115 Feet   100 x 2   Assistive device  4-wheeled walker    Gait Pattern  Step-through pattern;Decreased stride length;Decreased step length - right;Decreased step length - left;Poor foot clearance - left;Poor foot clearance - right    Ambulation Surface  Level;Indoor    Gait Comments  Verbal cues for longer stride length and wider base of support.      Neuro Re-ed    Neuro Re-ed Details   Standing without shoes donned to look at foot position.  R foot has increased pronation.  Standing in parallel bars working on rocking ant/posteriorly lifting heels/toes with UE support.  Exercises   Exercises  Other Exercises    Other Exercises   toe extension stretch sitting edge of mat * see printout for foot position for HEP; sitting hip ER stretch.  Standing mini squat in parallel bars with knee abduction.             PT Education - 10/14/17 1414    Education Details  Added toe extension stretch to current HEP    Person(s) Educated  Patient    Methods  Explanation;Demonstration;Handout    Comprehension  Verbalized understanding;Returned demonstration       PT Short Term Goals - 10/07/17 1250      PT SHORT TERM GOAL #1   Title  The patient will return demo HEP for LE strengthening, stretching, and general balance/mobility. (STG date 11/01/2017)    Time  4    Period  Weeks      PT SHORT TERM GOAL #2   Title  Perform Berg balance test with LTG to follow.     Baseline  10/07/17: 30/56    Time  4    Period  Weeks      PT SHORT TERM GOAL #3   Title  The patient will improve gait speed from 0.54 ft/sec to > or equal to  0.8 ft/sec to demo improving mobility.    Time  4    Period  Weeks      PT SHORT TERM GOAL #4   Title  Further assess/ trial AFOs in clinic for bilateral knee control and foot drop.    Time  4    Period  Weeks        PT Long Term Goals - 10/07/17 1251      PT LONG TERM GOAL #1   Title  The patient will return demo progression of HEP for post d/c routine. (LTG due 12/01/2017)    Time  8    Period  Weeks      PT LONG TERM GOAL #2   Title  The patient will improve gait speed from 0.54 ft/sec to > or equal to 1.2 ft/sec to demo improving household ambulation/ gait efficiency.    Time  8    Period  Weeks      PT LONG TERM GOAL #3   Title  The patient will ambulate >300 ft nonstop with rollator RW mod indep for limited community mobility (walking into/out of appointments).      Time  8    Period  Weeks      PT LONG TERM GOAL #4   Title  The patient will move sit<>stand 5/5 trial with UE support without requiring multiple attempts.     Time  8    Period  Weeks      PT LONG TERM GOAL #5   Title  The patient will be able to lift LEs into car without use of UE support to demo improving hip flexion strength.    Time  8      PT LONG TERM GOAL #6   Title  patient to improve Berg to >/= 38/56 demonstrating reduced fall risk    Time  8    Period  Weeks            Plan - 10/14/17 1420    Clinical Impression Statement  Although foot up braces do not allow for consistent toe clearance with gait activities, the patient notes trialing multiple AFOs in the past.  She does not wear braces in  the house.  PT added toe stretch for home and will request cert order to modify plan to include aquatics.  Will continue to consider AFO options to make gait more functional/efficient.    PT Treatment/Interventions  ADLs/Self Care Home Management;Balance training;Neuromuscular re-education;Patient/family education;Gait training;Stair training;Functional mobility training;Therapeutic  activities;Therapeutic exercise;Manual techniques;DME Instruction    PT Next Visit Plan  f/u for aquatics (sending order today to add to plan)/ f/uw ith suzanne to schedule.  Work on components to lead up to floor<>stand, hip adductor stretch/abductor strengthening.      Consulted and Agree with Plan of Care  Patient       Patient will benefit from skilled therapeutic intervention in order to improve the following deficits and impairments:  Abnormal gait, Impaired sensation, Impaired tone, Decreased activity tolerance, Decreased strength, Pain, Decreased balance, Decreased mobility, Difficulty walking, Increased muscle spasms, Decreased range of motion, Impaired flexibility  Visit Diagnosis: Other abnormalities of gait and mobility  Muscle weakness (generalized)  Muscle hypertonicity     Problem List Patient Active Problem List   Diagnosis Date Noted  . Quadriplegia, unspecified ([place]) 12/24/2016  . Spastic paresis 09/11/2016  . Multiple sclerosis ([place]) 09/11/2016  . Muscle spasticity 10/31/2013  . Nerve root pain 01/31/2013  . Cervical myelopathy ([place]) 05/05/2011  . BP (high blood pressure) 05/05/2011  . Restless leg 05/05/2011  . Arthralgia, sacroiliac 05/05/2011    Freddrick Gladson, PT 10/14/2017, 2:22 PM  [facility] [address] [place], [place], [address] Phone: [phone]   Fax:  [phone]  Name: Amy Bray MRN: [id number] Date of Birth: [birth year]

## 2017-10-19 ENCOUNTER — Encounter: Payer: Self-pay | Admitting: Physical Therapy

## 2017-10-19 ENCOUNTER — Ambulatory Visit: Payer: Medicare Other | Admitting: Physical Therapy

## 2017-10-19 DIAGNOSIS — R2689 Other abnormalities of gait and mobility: Secondary | ICD-10-CM | POA: Diagnosis not present

## 2017-10-19 DIAGNOSIS — M6281 Muscle weakness (generalized): Secondary | ICD-10-CM | POA: Diagnosis not present

## 2017-10-19 DIAGNOSIS — M6289 Other specified disorders of muscle: Secondary | ICD-10-CM | POA: Diagnosis not present

## 2017-10-19 NOTE — Patient Instructions (Addendum)
Access Code: [email or web address]  URL: [email or web address]  Date: 10/19/2017  Prepared by: Barry Brunner   Exercises  Seated Hamstring Stretch with Chair - 3 reps - 1 sets - 30 hold - 2x daily - 7x weekly  Clamshell - 10 reps - 1 sets - 1x daily - 7x weekly  Supine Hip Adductor Stretch - 3 reps - 1 sets - 30 hold - 1x daily - 7x weekly  Supine Lower Trunk Rotation - 10 reps - 1 sets - 5 hold - 1x daily - 7x weekly  Soleus Stretch on Wall - 2 reps - 1 sets - 30 hold - 1x daily - 7x weekly  sitting toe stretch - 2 reps - 1 sets - 30 seconds hold - 2x daily - 7x weekly   ADDED: Seated March - 10 reps - 1-2 sets - 1x daily - 7x weekly

## 2017-10-19 NOTE — Therapy (Signed)
[facility] [address] [place], [place], [address] Phone: [phone]   Fax:  [phone]  Physical Therapy Treatment  Patient Details  Name: Amy Bray MRN: [id number] Date of Birth: [birth year] Referring Provider (PT): Bess Harvest, MD   Encounter Date: 10/19/2017  PT End of Session - 10/19/17 2203    Visit Number  6    Number of Visits  17    Date for PT Re-Evaluation  12/01/17    Authorization Type  medicare and AARP    PT Start Time  1155    PT Stop Time  1245    PT Time Calculation (min)  50 min    Activity Tolerance  Patient tolerated treatment well    Behavior During Therapy  [facility] for tasks assessed/performed       Past Medical History:  Diagnosis Date  . Cervical myelopathy ([place])   . Multiple sclerosis ([place])   . Osteoporosis 2018   hip    Past Surgical History:  Procedure Laterality Date  . CERVICAL FUSION  01/2001, 01/2004   C3-C6    There were no vitals filed for this visit.  Subjective Assessment - 10/19/17 1200    Subjective  Reports she is looking forward to beginning pool therapy. Reports she used to get down on the floor to do some of her exercises, however has not done this for > 2 months.     Pertinent History  HTN, cervical myelopathy, 2 prior neck surgeries.      Patient Stated Goals  "Improving balance and picking my feet up."  Specific issues:  getting up from a chair, patient's confidence    Currently in Pain?  Yes    Pain Score  5     Pain Location  Coccyx    Pain Orientation  Lower    Pain Descriptors / Indicators  Aching    Pain Type  Chronic pain    Pain Onset  More than a month ago    Pain Frequency  Constant    Aggravating Factors   sitting or walking    Pain Relieving Factors  pain meds                       [facility] Adult PT Treatment/Exercise - 10/19/17 0001      Transfers   Transfers  Sit to Stand;Stand to Sit;Floor to Transfer    Sit to  Stand  4: Min guard;5: Supervision    Sit to Stand Details (indicate cue type and reason)  tends to pull up on RW handles;     Stand to Sit  4: Min guard    Floor to Transfer  4: Min assist;3: Mod assist;With upper extremity assist    Floor to Transfer Details (indicate cue type and reason)  stand to kneel on gardener's bench with min assist; hands on mat table with assist to position left foot forward and push up to standing 3 reps    Transfer Cueing  does not fully turn or align herself with the chair prior to sitting; doesn't lock her walker handles      Ambulation/Gait   Ambulation/Gait Assistance  5: Supervision    Ambulation/Gait Assistance Details  upright posture with less weight via UEs     Ambulation Distance (Feet)  60 Feet   x 4   Assistive device  4-wheeled walker    Gait Pattern  Step-through pattern;Decreased stride length;Decreased step length - right;Decreased step  length - left;Poor foot clearance - left;Poor foot clearance - right    Ambulation Surface  Indoor      Exercises   Exercises  Knee/Hip      Knee/Hip Exercises: Machines for Strengthening   Cybex Leg Press  30# bil LEs x 10 reps; incr to 50#x 5 reps biil LEs;       Knee/Hip Exercises: Seated   Sit to Sand  1 set;5 reps;without UE support   min assist for ant wt-shift and to stabilize            PT Education - 10/19/17 2202    Education Details  addition to HEP; technique for tall kneeling on garden bench to stand    Person(s) Educated  Patient    Methods  Explanation;Demonstration;Tactile cues;Verbal cues;Handout    Comprehension  Verbalized understanding;Returned demonstration;Verbal cues required;Tactile cues required;Need further instruction       PT Short Term Goals - 10/07/17 1250      PT SHORT TERM GOAL #1   Title  The patient will return demo HEP for LE strengthening, stretching, and general balance/mobility. (STG date 11/01/2017)    Time  4    Period  Weeks      PT SHORT TERM GOAL  #2   Title  Perform Berg balance test with LTG to follow.     Baseline  10/07/17: 30/56    Time  4    Period  Weeks      PT SHORT TERM GOAL #3   Title  The patient will improve gait speed from 0.54 ft/sec to > or equal to 0.8 ft/sec to demo improving mobility.    Time  4    Period  Weeks      PT SHORT TERM GOAL #4   Title  Further assess/ trial AFOs in clinic for bilateral knee control and foot drop.    Time  4    Period  Weeks        PT Long Term Goals - 10/07/17 1251      PT LONG TERM GOAL #1   Title  The patient will return demo progression of HEP for post d/c routine. (LTG due 12/01/2017)    Time  8    Period  Weeks      PT LONG TERM GOAL #2   Title  The patient will improve gait speed from 0.54 ft/sec to > or equal to 1.2 ft/sec to demo improving household ambulation/ gait efficiency.    Time  8    Period  Weeks      PT LONG TERM GOAL #3   Title  The patient will ambulate >300 ft nonstop with rollator RW mod indep for limited community mobility (walking into/out of appointments).      Time  8    Period  Weeks      PT LONG TERM GOAL #4   Title  The patient will move sit<>stand 5/5 trial with UE support without requiring multiple attempts.     Time  8    Period  Weeks      PT LONG TERM GOAL #5   Title  The patient will be able to lift LEs into car without use of UE support to demo improving hip flexion strength.    Time  8      PT LONG TERM GOAL #6   Title  patient to improve Berg to >/= 38/56 demonstrating reduced fall risk    Time  8  Period  Weeks            Plan - 10/19/17 2204    Clinical Impression Statement  Patient very interested in aquatic therapy and will confirm availability of 11/11/17 appt. Patient walking with bil foot up braces however the anchoring straps around her ankles were loose and educated pt on how to don braces to maximize support/foot clearance. Began floor transfer training with kneeling down to garden bench and pushing to  stand back up. Strengthening of bil LEs completed. Patient is motivated to maximize her independence and reduce her fall risk. Can continue to benefit from PT.     PT Frequency  2x / week    PT Duration  8 weeks    PT Treatment/Interventions  ADLs/Self Care Home Management;Balance training;Neuromuscular re-education;Patient/family education;Gait training;Stair training;Functional mobility training;Therapeutic activities;Therapeutic exercise;Manual techniques;DME Instruction;Aquatic Therapy;Orthotic Fit/Training    PT Next Visit Plan  f/u for aquatics--Suzanne to double-check availabilty of 10/23 1:30 pm appt);  Work on components to lead up to floor<>stand, hip adductor stretch/abductor strengthening.      Consulted and Agree with Plan of Care  Patient       Patient will benefit from skilled therapeutic intervention in order to improve the following deficits and impairments:  Abnormal gait, Impaired sensation, Impaired tone, Decreased activity tolerance, Decreased strength, Pain, Decreased balance, Decreased mobility, Difficulty walking, Increased muscle spasms, Decreased range of motion, Impaired flexibility  Visit Diagnosis: Other abnormalities of gait and mobility  Muscle weakness (generalized)  Muscle hypertonicity     Problem List Patient Active Problem List   Diagnosis Date Noted  . Quadriplegia, unspecified ([place]) 12/24/2016  . Spastic paresis 09/11/2016  . Multiple sclerosis ([place]) 09/11/2016  . Muscle spasticity 10/31/2013  . Nerve root pain 01/31/2013  . Cervical myelopathy ([place]) 05/05/2011  . BP (high blood pressure) 05/05/2011  . Restless leg 05/05/2011  . Arthralgia, sacroiliac 05/05/2011    Rexanne Mano, PT 10/19/2017, 10:13 PM  [facility] [address] [place], [place], [address] Phone: [phone]   Fax:  [phone]  Name: Amy Bray MRN: [id number] Date of Birth: 16-Mar-1945

## 2017-10-21 DIAGNOSIS — G35 Multiple sclerosis: Secondary | ICD-10-CM | POA: Diagnosis not present

## 2017-10-22 ENCOUNTER — Ambulatory Visit
Admission: RE | Admit: 2017-10-22 | Discharge: 2017-10-22 | Disposition: A | Discharge status: Home / Self Care | Payer: Medicare Other | Source: Ambulatory Visit | Attending: Family Medicine | Admitting: Family Medicine

## 2017-10-22 DIAGNOSIS — Z1231 Encounter for screening mammogram for malignant neoplasm of breast: Secondary | ICD-10-CM

## 2017-10-23 ENCOUNTER — Ambulatory Visit: Payer: Medicare Other | Attending: Diagnostic Neuroimaging | Admitting: Physical Therapy

## 2017-10-23 ENCOUNTER — Encounter: Payer: Self-pay | Admitting: Physical Therapy

## 2017-10-23 DIAGNOSIS — M6289 Other specified disorders of muscle: Secondary | ICD-10-CM | POA: Insufficient documentation

## 2017-10-23 DIAGNOSIS — R2689 Other abnormalities of gait and mobility: Secondary | ICD-10-CM | POA: Diagnosis not present

## 2017-10-23 DIAGNOSIS — M6281 Muscle weakness (generalized): Secondary | ICD-10-CM | POA: Insufficient documentation

## 2017-10-23 NOTE — Patient Instructions (Signed)
Access Code: [email or web address]  URL: [email or web address]  Date: 10/23/2017  Prepared by: Barry Brunner   Exercises  Seated Hamstring Stretch with Chair - 3 reps - 1 sets - 30 hold - 2x daily - 7x weekly  Clamshell - 10 reps - 1 sets - 1x daily - 7x weekly  Supine Hip Adductor Stretch - 3 reps - 1 sets - 30 hold - 1x daily - 7x weekly  Supine Lower Trunk Rotation - 10 reps - 1 sets - 5 hold - 1x daily - 7x weekly  Soleus Stretch on Wall - 2 reps - 1 sets - 30 hold - 1x daily - 7x weekly  sitting toe stretch - 2 reps - 1 sets - 30 seconds hold - 2x daily - 7x weekly  Seated March - 10 reps - 1-2 sets - 1x daily - 7x weekly   ADDED: Bridge with squeezing knees together - 10 reps - 1 sets - 1x daily - 7x weekly

## 2017-10-23 NOTE — Therapy (Signed)
[facility] [address] [place], [place], [address] Phone: [phone]   Fax:  [phone]  Physical Therapy Treatment  Patient Details  Name: Amy Bray MRN: [id number] Date of Birth: March 18, 1945 Referring Provider (PT): Bess Harvest, MD   Encounter Date: 10/23/2017  PT End of Session - 10/23/17 1932    Visit Number  7    Number of Visits  17    Date for PT Re-Evaluation  12/01/17    Authorization Type  medicare and AARP    PT Start Time  1100    PT Stop Time  1144    PT Time Calculation (min)  44 min    Activity Tolerance  Patient tolerated treatment well    Behavior During Therapy  [facility] for tasks assessed/performed       Past Medical History:  Diagnosis Date  . Cervical myelopathy ([place])   . Multiple sclerosis ([place])   . Osteoporosis 2018   hip    Past Surgical History:  Procedure Laterality Date  . CERVICAL FUSION  01/2001, 01/2004   C3-C6    There were no vitals filed for this visit.  Subjective Assessment - 10/23/17 1100    Subjective  Nothing new.     Pertinent History  HTN, cervical myelopathy, 2 prior neck surgeries.      Patient Stated Goals  "Improving balance and picking my feet up."  Specific issues:  getting up from a chair, patient's confidence    Currently in Pain?  Yes    Pain Score  3     Pain Location  Coccyx    Pain Orientation  Lower    Pain Descriptors / Indicators  Aching    Pain Type  Chronic pain    Pain Onset  More than a month ago    Pain Frequency  Constant    Aggravating Factors   sitting or walking    Pain Relieving Factors  pain meds                       [facility] Adult PT Treatment/Exercise - 10/23/17 1145      Transfers   Transfers  Sit to Stand;Stand to Sit;Floor to Transfer    Sit to Stand  4: Min guard;5: Supervision    Stand to Sit  4: Min guard    Floor to Transfer  3: Mod assist;With upper extremity assist    Floor to Transfer Details  (indicate cue type and reason)  from sitting to floor (with red mat) with min assist to control descent; pt then turned to sit and down to supine without assist; able to come up to tall kneeling with hands on mat table with incr time and closeguarding; tall kneeling to 1/2 kneeling with LLE forward with mod assist; 1/2 kneeling up to sit on EOB with mod assist      Ambulation/Gait   Ambulation/Gait Assistance  5: Supervision    Ambulation Distance (Feet)  60 Feet   25, 80   Assistive device  4-wheeled walker    Gait Pattern  Step-through pattern;Decreased stride length;Decreased step length - right;Decreased step length - left;Poor foot clearance - left;Poor foot clearance - right;Right foot flat;Left foot flat    Ambulation Surface  Indoor    Gait Comments  foot clearance better after exertion of floor to bed transfer and again after leg press      Exercises   Exercises  Knee/Hip  Knee/Hip Exercises: Stretches   Other Knee/Hip Stretches  hooklying adductor stretch; one leg at a time x 30 sec each x 2      Knee/Hip Exercises: Machines for Strengthening   Cybex Leg Press  50# bil LEs x 10; LLE 20# x 10; RLE 25# x 10      Knee/Hip Exercises: Seated   Sit to Sand  1 set;5 reps;with UE support      Knee/Hip Exercises: Supine   Bridges  Strengthening;Both;1 set;10 reps    Bridges with [employer]  Strengthening;Both;2 sets;5 reps   tried to squeeze towel roll; then tried knees together            PT Education - 10/23/17 1931    Education Details  addition to [employer]) Educated  Patient    Methods  Explanation;Demonstration;Tactile cues;Verbal cues;Handout    Comprehension  Verbalized understanding;Returned demonstration;Verbal cues required;Tactile cues required;Need further instruction       PT Short Term Goals - 10/07/17 1250      PT SHORT TERM GOAL #1   Title  The patient will return demo HEP for LE strengthening, stretching, and general balance/mobility. (STG  date 11/01/2017)    Time  4    Period  Weeks      PT SHORT TERM GOAL #2   Title  Perform Berg balance test with LTG to follow.     Baseline  10/07/17: 30/56    Time  4    Period  Weeks      PT SHORT TERM GOAL #3   Title  The patient will improve gait speed from 0.54 ft/sec to > or equal to 0.8 ft/sec to demo improving mobility.    Time  4    Period  Weeks      PT SHORT TERM GOAL #4   Title  Further assess/ trial AFOs in clinic for bilateral knee control and foot drop.    Time  4    Period  Weeks        PT Long Term Goals - 10/07/17 1251      PT LONG TERM GOAL #1   Title  The patient will return demo progression of HEP for post d/c routine. (LTG due 12/01/2017)    Time  8    Period  Weeks      PT LONG TERM GOAL #2   Title  The patient will improve gait speed from 0.54 ft/sec to > or equal to 1.2 ft/sec to demo improving household ambulation/ gait efficiency.    Time  8    Period  Weeks      PT LONG TERM GOAL #3   Title  The patient will ambulate >300 ft nonstop with rollator RW mod indep for limited community mobility (walking into/out of appointments).      Time  8    Period  Weeks      PT LONG TERM GOAL #4   Title  The patient will move sit<>stand 5/5 trial with UE support without requiring multiple attempts.     Time  8    Period  Weeks      PT LONG TERM GOAL #5   Title  The patient will be able to lift LEs into car without use of UE support to demo improving hip flexion strength.    Time  8      PT LONG TERM GOAL #6   Title  patient to improve Berg to >/= 38/56  demonstrating reduced fall risk    Time  8    Period  Weeks            Plan - 10/23/17 1935    Clinical Impression Statement  Patient motivated and very willing to try full bed to floor to bed transfer. She ultimately did require up to mod assist to get from tall kneeling to 1/2 kneeling to seated at EOB. Continue to work on bil LE strength and muscular endurance. She can continue to benefit from  PT.     PT Frequency  2x / week    PT Duration  8 weeks    PT Treatment/Interventions  ADLs/Self Care Home Management;Balance training;Neuromuscular re-education;Patient/family education;Gait training;Stair training;Functional mobility training;Therapeutic activities;Therapeutic exercise;Manual techniques;DME Instruction;Aquatic Therapy;Orthotic Fit/Training    PT Next Visit Plan  Has an aquatic therapy appt 10/23 at 2:15 pm--please give aquatics information sheet; Work on components to lead up to floor<>stand (on mat table try tall kneeling to 1/2 kneeling with bil UE support on bench), hip abductor strengthening.      Consulted and Agree with Plan of Care  Patient       Patient will benefit from skilled therapeutic intervention in order to improve the following deficits and impairments:  Abnormal gait, Impaired sensation, Impaired tone, Decreased activity tolerance, Decreased strength, Pain, Decreased balance, Decreased mobility, Difficulty walking, Increased muscle spasms, Decreased range of motion, Impaired flexibility  Visit Diagnosis: Other abnormalities of gait and mobility  Muscle weakness (generalized)     Problem List Patient Active Problem List   Diagnosis Date Noted  . Quadriplegia, unspecified ([place]) 12/24/2016  . Spastic paresis 09/11/2016  . Multiple sclerosis ([place]) 09/11/2016  . Muscle spasticity 10/31/2013  . Nerve root pain 01/31/2013  . Cervical myelopathy ([place]) 05/05/2011  . BP (high blood pressure) 05/05/2011  . Restless leg 05/05/2011  . Arthralgia, sacroiliac 05/05/2011    Rexanne Mano, PT 10/23/2017, 7:41 PM  [facility] [address] [place], [place], [address] Phone: [phone]   Fax:  [phone]  Name: TRINIDI TOPPINS MRN: [id number] Date of Birth: [birth year]

## 2017-10-26 ENCOUNTER — Encounter: Payer: Self-pay | Admitting: Physical Therapy

## 2017-10-26 ENCOUNTER — Ambulatory Visit: Payer: Medicare Other | Admitting: Physical Therapy

## 2017-10-26 DIAGNOSIS — M6281 Muscle weakness (generalized): Secondary | ICD-10-CM

## 2017-10-26 DIAGNOSIS — R2689 Other abnormalities of gait and mobility: Secondary | ICD-10-CM

## 2017-10-26 DIAGNOSIS — M6289 Other specified disorders of muscle: Secondary | ICD-10-CM | POA: Diagnosis not present

## 2017-10-26 NOTE — Therapy (Signed)
[facility] [address] [place], [place], [address] Phone: [phone]   Fax:  [phone]  Physical Therapy Treatment  Patient Details  Name: Amy Bray MRN: [id number] Date of Birth: 1945-04-04 Referring Provider (PT): Bess Harvest, MD   Encounter Date: 10/26/2017  PT End of Session - 10/26/17 1703    Visit Number  8    Number of Visits  17    Date for PT Re-Evaluation  12/01/17    Authorization Type  medicare and AARP    PT Start Time  1530    PT Stop Time  1618    PT Time Calculation (min)  48 min    Activity Tolerance  Patient tolerated treatment well    Behavior During Therapy  [facility] for tasks assessed/performed       Past Medical History:  Diagnosis Date  . Cervical myelopathy ([place])   . Multiple sclerosis ([place])   . Osteoporosis 2018   hip    Past Surgical History:  Procedure Laterality Date  . CERVICAL FUSION  01/2001, 01/2004   C3-C6    There were no vitals filed for this visit.  Subjective Assessment - 10/26/17 1537    Subjective  Nothing new.     Pertinent History  HTN, cervical myelopathy, 2 prior neck surgeries.      Patient Stated Goals  "Improving balance and picking my feet up."  Specific issues:  getting up from a chair, patient's confidence    Currently in Pain?  Yes    Pain Score  3     Pain Location  Coccyx    Pain Orientation  Lower    Pain Descriptors / Indicators  Aching    Pain Type  Chronic pain    Pain Onset  More than a month ago    Pain Frequency  Constant                       [facility] Adult PT Treatment/Exercise - 10/26/17 1624      Transfers   Transfers  Sit to Stand;Stand to Sit    Sit to Stand  4: Min guard;5: Supervision    Stand to Sit  4: Min guard    Floor to Transfer  --    Transfer Cueing  does not fully turn or align herself with the chair prior to sitting; doesn't lock her walker handles      Ambulation/Gait   Ambulation/Gait  Assistance  5: Supervision    Ambulation Distance (Feet)  80 Feet   15   Assistive device  4-wheeled walker    Gait Pattern  Step-through pattern;Decreased stride length;Decreased step length - right;Decreased step length - left;Poor foot clearance - left;Poor foot clearance - right;Right foot flat;Left foot flat    Ambulation Surface  Indoor    Gait Comments  moving slower today with less foot clearance bil "just not a good day... no particular reason"; vc for hip/knee flexion to improve bil foot clearance (improved after standing step taps to 2" and 4" blocks)     Posture/Postural Control   Posture/Postural Control  Postural limitations    Postural Limitations  Weight shift left    Posture Comments  upper torso leans to left in sitting and standing; in sitting taught prop over on rt forearm and reach LUE over head to stretch left side; provided myofascial stretch and held x 30 seconds      Self-Care   Other Self-Care Comments  Provided patient information re: pool therapy for upcoming appt 10/23 (address, parking, entering aquatic center, etc)      Exercises   Exercises  Knee/Hip    Other Exercises   tall kneeling on mat table with K-bench in front of her for bil UE support did mini-squats and back to upright x 10; side stepping along bench left and right (min assist to "unstick" her leg from the vinyl surface and then she would advance legs      Knee/Hip Exercises: Stretches   Other Knee/Hip Stretches  --      Knee/Hip Exercises: Machines for Strengthening   Cybex Leg Press  40# bil LEs x 5; 50# bil LEs x 5 (on initial attempt to do 50#, she could not; wanted to try again after 40# and was able to)      Knee/Hip Exercises: Seated   Sit to Sand  1 set;5 reps;with UE support      Knee/Hip Exercises: Supine   Bridges  Strengthening;Both;1 set;10 reps    Bridges with [employer]  Strengthening;Both;2 sets;5 reps   tried to squeeze towel roll; then tried knees together     Knee/Hip  Exercises: Sidelying   Clams  bil x 10 reps; not yet strong enough for yellow theraband (attempted with significant decr ROM)          Balance Exercises - 10/26/17 1601      Balance Exercises: Standing   SLS with Vectors  Solid surface;Upper extremity assist 2   toe/foot tap to 2" block with LLE; RLE 2" then 4" block; 10          PT Short Term Goals - 10/07/17 1250      PT SHORT TERM GOAL #1   Title  The patient will return demo HEP for LE strengthening, stretching, and general balance/mobility. (STG date 11/01/2017)    Time  4    Period  Weeks      PT SHORT TERM GOAL #2   Title  Perform Berg balance test with LTG to follow.     Baseline  10/07/17: 30/56    Time  4    Period  Weeks      PT SHORT TERM GOAL #3   Title  The patient will improve gait speed from 0.54 ft/sec to > or equal to 0.8 ft/sec to demo improving mobility.    Time  4    Period  Weeks      PT SHORT TERM GOAL #4   Title  Further assess/ trial AFOs in clinic for bilateral knee control and foot drop.    Time  4    Period  Weeks        PT Long Term Goals - 10/07/17 1251      PT LONG TERM GOAL #1   Title  The patient will return demo progression of HEP for post d/c routine. (LTG due 12/01/2017)    Time  8    Period  Weeks      PT LONG TERM GOAL #2   Title  The patient will improve gait speed from 0.54 ft/sec to > or equal to 1.2 ft/sec to demo improving household ambulation/ gait efficiency.    Time  8    Period  Weeks      PT LONG TERM GOAL #3   Title  The patient will ambulate >300 ft nonstop with rollator RW mod indep for limited community mobility (walking into/out of appointments).  Time  8    Period  Weeks      PT LONG TERM GOAL #4   Title  The patient will move sit<>stand 5/5 trial with UE support without requiring multiple attempts.     Time  8    Period  Weeks      PT LONG TERM GOAL #5   Title  The patient will be able to lift LEs into car without use of UE support to demo  improving hip flexion strength.    Time  8      PT LONG TERM GOAL #6   Title  patient to improve Berg to >/= 38/56 demonstrating reduced fall risk    Time  8    Period  Weeks            Plan - 10/26/17 1707    Clinical Impression Statement  Patient having a "slower day" with her walking. Does not feel she did anything to over-fatigue herself this weekend. Very willing to try any exercise presented. Did well with tall kneeling on mat table with k-bench for UE support, however was unable to progress to 1/2 kneeling with LLE forward with max assist (to practice for floor transfer). Patient can continue to benefit from PT to work towards goals.     PT Frequency  2x / week    PT Duration  8 weeks    PT Treatment/Interventions  ADLs/Self Care Home Management;Balance training;Neuromuscular re-education;Patient/family education;Gait training;Stair training;Functional mobility training;Therapeutic activities;Therapeutic exercise;Manual techniques;DME Instruction;Aquatic Therapy;Orthotic Fit/Training    PT Next Visit Plan  See if she can go to pool appt 11/6 at 1:30?? (let Vinnie Level know); begin to check STG's (due 10/13, however sees eval'g PT on 10/16)  Work on components to lead up to floor<>stand (on firm mat table try tall kneeling to 1/2 kneeling with bil UE support on k-bench--10/07 used soft top mat table and made tall kneeling side-stepping difficult)), hip abductor strengthening; likes leg press machine;    Consulted and Agree with Plan of Care  Patient       Patient will benefit from skilled therapeutic intervention in order to improve the following deficits and impairments:  Abnormal gait, Impaired sensation, Impaired tone, Decreased activity tolerance, Decreased strength, Pain, Decreased balance, Decreased mobility, Difficulty walking, Increased muscle spasms, Decreased range of motion, Impaired flexibility  Visit Diagnosis: Other abnormalities of gait and mobility  Muscle weakness  (generalized)     Problem List Patient Active Problem List   Diagnosis Date Noted  . Quadriplegia, unspecified ([place]) 12/24/2016  . Spastic paresis 09/11/2016  . Multiple sclerosis ([place]) 09/11/2016  . Muscle spasticity 10/31/2013  . Nerve root pain 01/31/2013  . Cervical myelopathy ([place]) 05/05/2011  . BP (high blood pressure) 05/05/2011  . Restless leg 05/05/2011  . Arthralgia, sacroiliac 05/05/2011    Rexanne Mano, PT 10/26/2017, 6:28 PM  [facility] [address] [place], [place], [address] Phone: [phone]   Fax:  [phone]  Name: Amy Bray MRN: [id number] Date of Birth: 09-May-1945

## 2017-10-29 ENCOUNTER — Ambulatory Visit: Payer: Medicare Other | Admitting: Physical Therapy

## 2017-10-30 ENCOUNTER — Encounter: Payer: Self-pay | Admitting: Physical Therapy

## 2017-10-30 ENCOUNTER — Ambulatory Visit: Payer: Medicare Other | Admitting: Physical Therapy

## 2017-10-30 DIAGNOSIS — M6281 Muscle weakness (generalized): Secondary | ICD-10-CM

## 2017-10-30 DIAGNOSIS — R2689 Other abnormalities of gait and mobility: Secondary | ICD-10-CM | POA: Diagnosis not present

## 2017-10-30 DIAGNOSIS — M6289 Other specified disorders of muscle: Secondary | ICD-10-CM | POA: Diagnosis not present

## 2017-10-30 NOTE — Therapy (Signed)
[facility] [address] [place], [place], [address] Phone: [phone]   Fax:  [phone]  Physical Therapy Treatment  Patient Details  Name: Amy Bray MRN: [id number] Date of Birth: 07/13/1945 Referring Provider (PT): Vikram Penumali, MD   Encounter Date: 10/30/2017  PT End of Session - 10/30/17 1248    Visit Number  9    Number of Visits  17    Date for PT Re-Evaluation  12/01/17    Authorization Type  medicare and [employer]    PT Start Time  1150    PT Stop Time  1230    PT Time Calculation (min)  40 min    Equipment Utilized During Treatment  Gait belt    Activity Tolerance  Patient tolerated treatment well    Behavior During Therapy  WFL for tasks assessed/performed       Past Medical History:  Diagnosis Date  . Cervical myelopathy (HCC)   . Multiple sclerosis (HCC)   . Osteoporosis 2018   hip    Past Surgical History:  Procedure Laterality Date  . CERVICAL FUSION  01/2001, 01/2004   C3-C6    There were no vitals filed for this visit.  Subjective Assessment - 10/30/17 1157    Subjective  Pt reports she has been performing her HEP exercises at home and everything has been going well.     Pertinent History  HTN, cervical myelopathy, 2 prior neck surgeries.      Patient Stated Goals  "Improving balance and picking my feet up."  Specific issues:  getting up from a chair, patient's confidence    Currently in Pain?  Yes    Pain Score  5     Pain Location  Coccyx    Pain Orientation  Lower    Pain Descriptors / Indicators  Aching;Sharp    Pain Type  Chronic pain    Pain Onset  More than a month ago    Pain Frequency  Constant    Pain Relieving Factors  Pt reports getting a steroid injection on 10/21 for pain relief                        [facility] Adult PT Treatment/Exercise - 10/30/17 1201      Transfers   Transfers  Sit to Stand;Stand to Sit;Stand Pivot Transfers    Sit to Stand   5: Supervision    Stand to Sit  5: Supervision    Stand Pivot Transfers  5: Supervision    Comments  Pt attempts to perform STS from edge of mat with no UE support; however, unable to successfully achieve stand after multiple attempts requiring UE assist.       Ambulation/Gait   Ambulation/Gait  Yes    Ambulation/Gait Assistance  5: Supervision    Ambulation Distance (Feet)  40 Feet    Assistive device  4-wheeled walker    Gait Pattern  Step-through pattern;Decreased stride length;Decreased step length - right;Decreased step length - left;Poor foot clearance - left;Poor foot clearance - right;Right foot flat;Left foot flat   Pt wore bilateral foot up braces    Ambulation Surface  Level;Indoor    Gait velocity  0.62 ft/sec indicative of household ambulation with her rollator       Standardized Balance Assessment   Standardized Balance Assessment  Berg Balance Test      Berg Balance Test   Sit to Stand  Able to stand    independently using hands    Standing Unsupported  Able to stand 2 minutes with supervision    Sitting with Back Unsupported but Feet Supported on Floor or Stool  Able to sit safely and securely 2 minutes    Stand to Sit  Sits safely with minimal use of hands    Transfers  Able to transfer with verbal cueing and /or supervision    Standing Unsupported with Eyes Closed  Able to stand 10 seconds with supervision    Standing Ubsupported with Feet Together  Able to place feet together independently and stand for 1 minute with supervision    From Standing, Reach Forward with Outstretched Arm  Can reach forward >12 cm safely (5")    From Standing Position, Pick up Object from [place] to pick up shoe, needs supervision    From Standing Position, Turn to Look Behind Over each Shoulder  Looks behind one side only/other side shows less weight shift    Turn 360 Degrees  Needs close supervision or verbal cueing    Standing Unsupported, Alternately Place Feet on Step/Stool  Needs  assistance to keep from falling or unable to try    Standing Unsupported, One Foot in Front  Able to plae foot ahead of the other independently and hold 30 seconds    Standing on One Leg  Tries to lift leg/unable to hold 3 seconds but remains standing independently    Total Score  36      Exercises   Exercises  Knee/Hip    Other Exercises   Pt performs x10 reps of alternating advancement of LE's with knee in flexion to mat table using the K- bench for UE assist to improve strengthening and stance duration. Pt progresses to tall kneeling with K bench for UE assist performing x10 unresisted trials of sitting back onto pillow and powering up to increase hip extension strengthening. Pt then performs x10 reps with therapist providing band resistance and multimodal cues for posture and technique. Pt then progresses to 1x10 reps of hip abduction in tall kneeling with bench for UE assist demonstrating increased difficulty with L LE> RLE             PT Education - 10/30/17 1248    Education Details  Therapist provided education regarding continuation of HEP exercises at home to improve LE strength and flexibility.     Person(s) Educated  Patient    Methods  Explanation    Comprehension  Verbalized understanding       PT Short Term Goals - 10/30/17 1249      PT SHORT TERM GOAL #1   Title  The patient will return demo HEP for LE strengthening, stretching, and general balance/mobility. (STG date 11/01/2017)    Time  4    Period  Weeks    Status  Achieved      PT SHORT TERM GOAL #2   Title  Perform Berg balance test with LTG to follow.     Baseline  10/11: Berg balance score of 36/56 indicating high risk for falls     Time  4    Period  Weeks    Status  Achieved      PT SHORT TERM GOAL #3   Title  The patient will improve gait speed from 0.54 ft/sec to > or equal to 0.8 ft/sec to demo improving mobility.    Baseline  10/11: Pt demonstrates gait speed of 0.62 ft/sec using her rollator  Time  4    Period  Weeks    Status  Not Met      PT SHORT TERM GOAL #4   Title  Further assess/ trial AFOs in clinic for bilateral knee control and foot drop.    Baseline  10/11: Pt reports usage of AFO's in the past with no success and is happy with her bilateral foot up braces.     Time  4    Period  Weeks    Status  Deferred        PT Long Term Goals - 10/30/17 1251      PT LONG TERM GOAL #1   Title  The patient will return demo progression of HEP for post d/c routine. (LTG due 12/01/2017)    Time  8    Period  Weeks      PT LONG TERM GOAL #2   Title  The patient will improve gait speed from 0.54 ft/sec to > or equal to 1.2 ft/sec to demo improving household ambulation/ gait efficiency.    Time  8    Period  Weeks      PT LONG TERM GOAL #3   Title  The patient will ambulate >300 ft nonstop with rollator RW mod indep for limited community mobility (walking into/out of appointments).      Time  8    Period  Weeks      PT LONG TERM GOAL #4   Title  The patient will move sit<>stand 5/5 trial with UE support without requiring multiple attempts.     Time  8    Period  Weeks      PT LONG TERM GOAL #5   Title  The patient will be able to lift LEs into car without use of UE support to demo improving hip flexion strength.    Time  8      PT LONG TERM GOAL #6   Title  patient to improve Berg to >/= 38/56 demonstrating reduced fall risk    Time  8    Period  Weeks            Plan - 10/30/17 1253    Clinical Impression Statement  Today's skilled session focused on LE strengthening with core stabilization and reassessment of patient STG's. Pt tolerated session well and was able to perform LE strengthening exercises in tall kneeling while tolerating increased challenge with resistance. Pt continues to demonstrated LLE weakness > R LE with observable compensatory movements noted during exercises requiring therapist to provide multimodal cues for correction. Pt has improved  her gait speed using her rollator to 0.62 ft/sec although not to goal level. Pt's Berg score has improved to 36/56 indicative of high fall risk potential. Overall, patient has met 2/4 STG's, did not meet 1/4 STG's and therapist deferred 1 STG's 2/2 to patient reporting previous trials with AFO's for improved foot clearance and knee stability with no success and prefers to keep her bilateral foot up braces. Pt will continue to benefit from skilled PT to address balance, strength, and gait deficits to improve independence with functional mobility.     PT Frequency  2x / week    PT Duration  8 weeks    PT Treatment/Interventions  ADLs/Self Care Home Management;Balance training;Neuromuscular re-education;Patient/family education;Gait training;Stair training;Functional mobility training;Therapeutic activities;Therapeutic exercise;Manual techniques;DME Instruction;Aquatic Therapy;Orthotic Fit/Training    PT Next Visit Plan  PROGRESS NOTE NEXT VISIT consider revising & downgrading gait speed goal, See if she can  go to pool appt 11/6 at 1:30?? (let Vinnie Level know); Work on components to lead up to floor<>stand (on firm mat table try tall kneeling to 1/2 kneeling with bil UE support on k-bench--10/07 used soft top mat table and made tall kneeling side-stepping difficult)), hip abductor strengthening; likes leg press machine;    Consulted and Agree with Plan of Care  Patient       Patient will benefit from skilled therapeutic intervention in order to improve the following deficits and impairments:  Abnormal gait, Impaired sensation, Impaired tone, Decreased activity tolerance, Decreased strength, Pain, Decreased balance, Decreased mobility, Difficulty walking, Increased muscle spasms, Decreased range of motion, Impaired flexibility  Visit Diagnosis: Other abnormalities of gait and mobility  Muscle weakness (generalized)     Problem List Patient Active Problem List   Diagnosis Date Noted  . Quadriplegia,  unspecified ([place]) 12/24/2016  . Spastic paresis 09/11/2016  . Multiple sclerosis ([place]) 09/11/2016  . Muscle spasticity 10/31/2013  . Nerve root pain 01/31/2013  . Cervical myelopathy ([place]) 05/05/2011  . BP (high blood pressure) 05/05/2011  . Restless leg 05/05/2011  . Arthralgia, sacroiliac 05/05/2011    Floreen Comber, SPT 10/30/2017, 12:59 PM  [facility] [address] [place], [place], [address] Phone: [phone]   Fax:  [phone]  Name: Amy Bray MRN: [id number] Date of Birth: 08-18-1945

## 2017-11-01 DIAGNOSIS — Z23 Encounter for immunization: Secondary | ICD-10-CM | POA: Diagnosis not present

## 2017-11-04 ENCOUNTER — Encounter: Payer: Self-pay | Admitting: Physical Therapy

## 2017-11-04 ENCOUNTER — Ambulatory Visit: Payer: Medicare Other | Admitting: Physical Therapy

## 2017-11-04 DIAGNOSIS — R2689 Other abnormalities of gait and mobility: Secondary | ICD-10-CM

## 2017-11-04 DIAGNOSIS — M6289 Other specified disorders of muscle: Secondary | ICD-10-CM | POA: Diagnosis not present

## 2017-11-04 DIAGNOSIS — M6281 Muscle weakness (generalized): Secondary | ICD-10-CM | POA: Diagnosis not present

## 2017-11-04 NOTE — Therapy (Signed)
.[facility] [address] [place], [place], [address] Phone: [phone]   Fax:  [phone]  Physical Therapy Treatment  Patient Details  Name: Amy Bray MRN: [id number] Date of Birth: 1945/09/08 Referring Provider (PT): Bess Harvest, MD  Progress Note Reporting Period 10/02/17 to 11/04/17  See note below for Objective Data and Assessment of Progress/Goals.    Encounter Date: 11/04/2017  PT End of Session - 11/04/17 1251    Visit Number  10    Number of Visits  17    Date for PT Re-Evaluation  12/01/17    Authorization Type  medicare and AARP    PT Start Time  1140    PT Stop Time  1225    PT Time Calculation (min)  45 min    Equipment Utilized During Treatment  Gait belt    Activity Tolerance  Patient tolerated treatment well    Behavior During Therapy  WFL for tasks assessed/performed       Past Medical History:  Diagnosis Date  . Cervical myelopathy ([place])   . Multiple sclerosis ([place])   . Osteoporosis 2018   hip    Past Surgical History:  Procedure Laterality Date  . CERVICAL FUSION  01/2001, 01/2004   C3-C6    There were no vitals filed for this visit.  Subjective Assessment - 11/04/17 1156    Subjective  Reports no new complaints. HEP exercises are going well at home.     Pertinent History  HTN, cervical myelopathy, 2 prior neck surgeries.      Patient Stated Goals  "Improving balance and picking my feet up."  Specific issues:  getting up from a chair, patient's confidence    Currently in Pain?  Yes    Pain Score  4     Pain Location  Coccyx    Pain Orientation  Lower    Pain Descriptors / Indicators  Aching;Sharp    Pain Type  Chronic pain    Pain Onset  More than a month ago                       [facility] Adult PT Treatment/Exercise - 11/04/17 1234      Transfers   Transfers  Sit to Stand;Stand to [employer] Transfers    Sit to Stand  5: Supervision     Stand to Sit  5: Supervision    Stand Pivot Transfers  5: Supervision    Comments  Therapist provides multimodal cues to bring knees apart prior to stand to prevent heavy reliance on knee valgus for achieving stand      Ambulation/Gait   Ambulation/Gait  Yes    Ambulation/Gait Assistance  5: Supervision    Ambulation/Gait Assistance Details  Pt performs ambulation trial with her rollator on level surface. Therapist provides verbal cues for proper step length with patient demonstrating difficulty with carry over. Pt reports increased LE fatigue s/p quadriped strengthening exercises prior to ambulation trial which may have contributed to increase in toe drag observed    Ambulation Distance (Feet)  115 Feet    Assistive device  4-wheeled walker    Gait Pattern  Step-through pattern;Decreased stride length;Decreased step length - right;Decreased step length - left;Poor foot clearance - left;Poor foot clearance - right;Right foot flat;Left foot flat    Ambulation Surface  Level;Indoor      Therapeutic Activites    Therapeutic Activities  Other Therapeutic Activities    Other Therapeutic  Activities  Pt performs x2 mat<>floor transfers. Pt demonstrates ability to perform mat> floor transfer with min A for LE placement. Pt requires max A +2 to recover from floor>mat with therapist providing multimodal cues for technique, safety, and extremity placement. Pt then performs 2nd attempt with max A plus 1 helper for lateral boost to mat.       Exercises   Exercises  Knee/Hip    Other Exercises   Pt performs 1x10 reps of hip extension in quadriped position demonstrating increased difficulty on L > R side. Pt required exercise modification to R LE 2/2 fatigue with inability to maintain pelvic stability for proper technique. Pt demonstrates multiple episodes of collapsing towards mat with one therapist providing assistance at pelvis and 2nd therapist providing assistance to UE's and trunk to maintain safety. Pt  takes prolonged rest break and then performs tall kneeling hip extension with K-bench in front for UE support. Therapist provided manual resistance to increase challenge for 1x10 reps.       Knee/Hip Exercises: Aerobic   Nustep  Pt performs NuStep for 5 minute duration on resistance level 1. Therapist places red resistance band around distal thighs with white beam placed between legs for tactile feedback to maintain hip abduction to prevent knocking of knees during aerobic exercise.              PT Education - 11/04/17 1250    Education Details  Therapist provided education regarding safety with floor<>mat transfers and proper technique for LE strengthening exercises in quadriped with exercise modifications as needed.     Person(s) Educated  Patient    Methods  Explanation    Comprehension  Verbalized understanding       PT Short Term Goals - 10/30/17 1249      PT SHORT TERM GOAL #1   Title  The patient will return demo HEP for LE strengthening, stretching, and general balance/mobility. (STG date 11/01/2017)    Time  4    Period  Weeks    Status  Achieved      PT SHORT TERM GOAL #2   Title  Perform Berg balance test with LTG to follow.     Baseline  10/11: Berg balance score of 36/56 indicating high risk for falls     Time  4    Period  Weeks    Status  Achieved      PT SHORT TERM GOAL #3   Title  The patient will improve gait speed from 0.54 ft/sec to > or equal to 0.8 ft/sec to demo improving mobility.    Baseline  10/11: Pt demonstrates gait speed of 0.62 ft/sec using her rollator     Time  4    Period  Weeks    Status  Not Met      PT SHORT TERM GOAL #4   Title  Further assess/ trial AFOs in clinic for bilateral knee control and foot drop.    Baseline  10/11: Pt reports usage of AFO's in the past with no success and is happy with her bilateral foot up braces.     Time  4    Period  Weeks    Status  Deferred        PT Long Term Goals - 10/30/17 1251      PT  LONG TERM GOAL #1   Title  The patient will return demo progression of HEP for post d/c routine. (LTG due 12/01/2017)    Time  8  Period  Weeks      PT LONG TERM GOAL #2   Title  The patient will improve gait speed from 0.54 ft/sec to > or equal to 1.2 ft/sec to demo improving household ambulation/ gait efficiency.    Time  8    Period  Weeks      PT LONG TERM GOAL #3   Title  The patient will ambulate >300 ft nonstop with rollator RW mod indep for limited community mobility (walking into/out of appointments).      Time  8    Period  Weeks      PT LONG TERM GOAL #4   Title  The patient will move sit<>stand 5/5 trial with UE support without requiring multiple attempts.     Time  8    Period  Weeks      PT LONG TERM GOAL #5   Title  The patient will be able to lift LEs into car without use of UE support to demo improving hip flexion strength.    Time  8      PT LONG TERM GOAL #6   Title  patient to improve Berg to >/= 38/56 demonstrating reduced fall risk    Time  8    Period  Weeks            Plan - 11/04/17 1252    Clinical Impression Statement  Today's skilled session focused on LE strengthening and floor recovery transfers with therapist providing assistance. Pt demonstrates increased difficulty performing quadriped hip extension strengthening exercises with x2 episodes of collapsing down towards the mat requiring two helpers to stabilize for safety consideration. Pt reports she is unable to self recover 2/2 increased fatigue s/p strengthening exercises. Pt performs x2 floor<>mat transfers with therapist providing multimodal cues and max assist to achieve lateral boost to return to mat. Pt continues to demonstrate decreased step length, decreased DF, and decreased stride length bilaterally during ambulation trial on level surface with her rollator. Patient will continue to benefit from skilled PT to address strength, balance, and functional mobility deficits.     PT  Frequency  2x / week    PT Duration  8 weeks    PT Treatment/Interventions  ADLs/Self Care Home Management;Balance training;Neuromuscular re-education;Patient/family education;Gait training;Stair training;Functional mobility training;Therapeutic activities;Therapeutic exercise;Manual techniques;DME Instruction;Aquatic Therapy;Orthotic Fit/Training    PT Next Visit Plan  PROGRESS NOTE NEXT VISIT consider revising & downgrading gait speed goal, she can go to pool 11/6 @ 1:30 but also has land appt. 11/6 at 11. Left note for Vinnie Level and will need to follow up with Marcie Bal (let Vinnie Level know); Work on components to lead up to floor<>stand (on firm mat table try tall kneeling to 1/2 kneeling with bil UE support on k-bench--10/07 used soft top mat table and made tall kneeling side-stepping difficult)), hip abductor strengthening; likes leg press machine;    Consulted and Agree with Plan of Care  Patient       Patient will benefit from skilled therapeutic intervention in order to improve the following deficits and impairments:  Abnormal gait, Impaired sensation, Impaired tone, Decreased activity tolerance, Decreased strength, Pain, Decreased balance, Decreased mobility, Difficulty walking, Increased muscle spasms, Decreased range of motion, Impaired flexibility  Visit Diagnosis: Other abnormalities of gait and mobility  Muscle weakness (generalized)     Problem List Patient Active Problem List   Diagnosis Date Noted  . Quadriplegia, unspecified ([place]) 12/24/2016  . Spastic paresis 09/11/2016  . Multiple sclerosis ([place]) 09/11/2016  .  Muscle spasticity 10/31/2013  . Nerve root pain 01/31/2013  . Cervical myelopathy ([place]) 05/05/2011  . BP (high blood pressure) 05/05/2011  . Restless leg 05/05/2011  . Arthralgia, sacroiliac 05/05/2011    Tera Helper 11/04/2017, 12:57 PM  [facility] [address] [place], [place], [address] Phone:  [phone]   Fax:  [phone]  Name: Amy Bray MRN: [id number] Date of Birth: [birth year]

## 2017-11-06 ENCOUNTER — Ambulatory Visit: Payer: Medicare Other | Admitting: Rehabilitative and Restorative Service Providers"

## 2017-11-06 DIAGNOSIS — M6289 Other specified disorders of muscle: Secondary | ICD-10-CM

## 2017-11-06 DIAGNOSIS — M6281 Muscle weakness (generalized): Secondary | ICD-10-CM | POA: Diagnosis not present

## 2017-11-06 DIAGNOSIS — R2689 Other abnormalities of gait and mobility: Secondary | ICD-10-CM

## 2017-11-06 NOTE — Therapy (Signed)
[facility] [address] [place], [place], [address] Phone: [phone]   Fax:  [phone]  Physical Therapy Treatment  Patient Details  Name: Amy Bray MRN: [id number] Date of Birth: 06-Oct-1945 Referring Provider (PT): Bess Harvest, MD   Encounter Date: 11/06/2017  PT End of Session - 11/06/17 1105    Visit Number  11    Number of Visits  17    Date for PT Re-Evaluation  12/01/17    Authorization Type  medicare and [employer]    PT Start Time  1055    PT Stop Time  1150    PT Time Calculation (min)  55 min    Equipment Utilized During Treatment  Gait belt    Activity Tolerance  Patient tolerated treatment well    Behavior During Therapy  WFL for tasks assessed/performed       Past Medical History:  Diagnosis Date  . Cervical myelopathy ([place])   . Multiple sclerosis ([place])   . Osteoporosis 2018   hip    Past Surgical History:  Procedure Laterality Date  . CERVICAL FUSION  01/2001, 01/2004   C3-C6    There were no vitals filed for this visit.  Subjective Assessment - 11/06/17 1057    Subjective  The patient reports yesterday was a bad day for picking up her foot and she felt really slow in speed.  Today feels a little bit better.  The patient was doing pool 2 years ago "I wasn't as bad then as I am now".  She notes she liked the pool and got a benefit from it.      Pertinent History  HTN, cervical myelopathy, 2 prior neck surgeries.      Patient Stated Goals  "Improving balance and picking my feet up."  Specific issues:  getting up from a chair, patient's confidence    Currently in Pain?  Yes    Pain Score  5     Pain Location  Coccyx    Pain Descriptors / Indicators  Aching;Sharp    Pain Type  Chronic pain    Pain Onset  More than a month ago    Pain Frequency  Constant    Aggravating Factors   sitting or walking    Pain Relieving Factors  injections         [facility] PT Assessment - 11/06/17 1119       Ambulation/Gait   Gait velocity  0.75 ft/sec with bilateral stockinettes to mimic leather toe caps.                    [facility] Adult PT Treatment/Exercise - 11/06/17 1119      Ambulation/Gait   Ambulation/Gait  Yes    Ambulation/Gait Assistance  5: Supervision    Ambulation/Gait Assistance Details  PT added stockinette to mimic toe caps on shoes.  This was done to improve foot clearance.  The patient does demo greater stride length, less toe drag and improved gait speed.  We discussed as potential option for shoe modification.      Ambulation Distance (Feet)  230 Feet    Assistive device  4-wheeled walker    Gait Pattern  Step-through pattern;Decreased stride length;Decreased step length - right;Decreased step length - left;Poor foot clearance - left;Poor foot clearance - right;Right foot flat;Left foot flat    Ambulation Surface  Level;Indoor      Neuro Re-ed    Neuro Re-ed Details   Quadriped emphasizing wider  knee position and lateral rocking for weight shift with CGA, attempted tall kneeling, however patient pulls anteriorly and needs min A.  Placed therapy bench anterior to patient for support in tall kneeling and performed alternating UE reaching activities; Obtained 1/2 kneeling with mod A and held x 30 seconds each side.  Standing dec'ing UE support on rollator working on anterior posterior sway.  Wall bumps emphasizing balance without UE support and center of gravity control.   Lateral reaching working on weight shift and dec'ing UE support near countertop.      Exercises   Exercises  Other Exercises    Other Exercises   Long sitting with posterior lean propping on UEs, butterfly stretch and 1/2 straddle stretch with min A.               PT Short Term Goals - 10/30/17 1249      PT SHORT TERM GOAL #1   Title  The patient will return demo HEP for LE strengthening, stretching, and general balance/mobility. (STG date 11/01/2017)    Time  4    Period  Weeks     Status  Achieved      PT SHORT TERM GOAL #2   Title  Perform Berg balance test with LTG to follow.     Baseline  10/11: Berg balance score of 36/56 indicating high risk for falls     Time  4    Period  Weeks    Status  Achieved      PT SHORT TERM GOAL #3   Title  The patient will improve gait speed from 0.54 ft/sec to > or equal to 0.8 ft/sec to demo improving mobility.    Baseline  10/11: Pt demonstrates gait speed of 0.62 ft/sec using her rollator     Time  4    Period  Weeks    Status  Not Met      PT SHORT TERM GOAL #4   Title  Further assess/ trial AFOs in clinic for bilateral knee control and foot drop.    Baseline  10/11: Pt reports usage of AFO's in the past with no success and is happy with her bilateral foot up braces.     Time  4    Period  Weeks    Status  Deferred        PT Long Term Goals - 10/30/17 1251      PT LONG TERM GOAL #1   Title  The patient will return demo progression of HEP for post d/c routine. (LTG due 12/01/2017)    Time  8    Period  Weeks      PT LONG TERM GOAL #2   Title  The patient will improve gait speed from 0.54 ft/sec to > or equal to 1.2 ft/sec to demo improving household ambulation/ gait efficiency.    Time  8    Period  Weeks      PT LONG TERM GOAL #3   Title  The patient will ambulate >300 ft nonstop with rollator RW mod indep for limited community mobility (walking into/out of appointments).      Time  8    Period  Weeks      PT LONG TERM GOAL #4   Title  The patient will move sit<>stand 5/5 trial with UE support without requiring multiple attempts.     Time  8    Period  Weeks      PT LONG TERM GOAL #5  Title  The patient will be able to lift LEs into car without use of UE support to demo improving hip flexion strength.    Time  8      PT LONG TERM GOAL #6   Title  patient to improve Berg to >/= 38/56 demonstrating reduced fall risk    Time  8    Period  Weeks            Plan - 11/06/17 1207    Clinical  Impression Statement  The patient tolerated quadriped to tall kneeling activities with assist today.  She improved gait mechanics and speed with use of stockinettes over toes of shoes to improve swing phase of gait.  PT to consider referring to orthotist to have shoe modifications completed.      PT Treatment/Interventions  ADLs/Self Care Home Management;Balance training;Neuromuscular re-education;Patient/family education;Gait training;Stair training;Functional mobility training;Therapeutic activities;Therapeutic exercise;Manual techniques;DME Instruction;Aquatic Therapy;Orthotic Fit/Training    PT Next Visit Plan  *The patient can do 11/6 @1 :30 with SXD in pool (will modify appt once SXD checks on [facility] availability 11/6).  Hip abductor strengthening, balance dec'ing UE support.  *Gait with toe caps to determine if patient wants to have shoes modified to allow for improved foot clearance.    Consulted and Agree with Plan of Care  Patient       Patient will benefit from skilled therapeutic intervention in order to improve the following deficits and impairments:  Abnormal gait, Impaired sensation, Impaired tone, Decreased activity tolerance, Decreased strength, Pain, Decreased balance, Decreased mobility, Difficulty walking, Increased muscle spasms, Decreased range of motion, Impaired flexibility  Visit Diagnosis: Other abnormalities of gait and mobility  Muscle weakness (generalized)  Muscle hypertonicity     Problem List Patient Active Problem List   Diagnosis Date Noted  . Quadriplegia, unspecified ([place]) 12/24/2016  . Spastic paresis 09/11/2016  . Multiple sclerosis ([place]) 09/11/2016  . Muscle spasticity 10/31/2013  . Nerve root pain 01/31/2013  . Cervical myelopathy ([place]) 05/05/2011  . BP (high blood pressure) 05/05/2011  . Restless leg 05/05/2011  . Arthralgia, sacroiliac 05/05/2011    Denishia Citro, PT 11/06/2017, 12:10 PM  [facility] [address] [place], [place], [address] Phone: [phone]   Fax:  [phone]  Name: Amy Bray MRN: [id number] Date of Birth: 10/28/1945

## 2017-11-09 DIAGNOSIS — Z79899 Other long term (current) drug therapy: Secondary | ICD-10-CM | POA: Diagnosis not present

## 2017-11-09 DIAGNOSIS — Z79891 Long term (current) use of opiate analgesic: Secondary | ICD-10-CM | POA: Diagnosis not present

## 2017-11-09 DIAGNOSIS — M5137 Other intervertebral disc degeneration, lumbosacral region: Secondary | ICD-10-CM | POA: Diagnosis not present

## 2017-11-09 DIAGNOSIS — G894 Chronic pain syndrome: Secondary | ICD-10-CM | POA: Diagnosis not present

## 2017-11-09 DIAGNOSIS — M47817 Spondylosis without myelopathy or radiculopathy, lumbosacral region: Secondary | ICD-10-CM | POA: Diagnosis not present

## 2017-11-09 DIAGNOSIS — M533 Sacrococcygeal disorders, not elsewhere classified: Secondary | ICD-10-CM | POA: Diagnosis not present

## 2017-11-10 ENCOUNTER — Ambulatory Visit: Payer: Medicare Other | Admitting: Physical Therapy

## 2017-11-11 ENCOUNTER — Ambulatory Visit: Payer: Medicare Other | Admitting: Physical Therapy

## 2017-11-11 DIAGNOSIS — M6281 Muscle weakness (generalized): Secondary | ICD-10-CM | POA: Diagnosis not present

## 2017-11-11 DIAGNOSIS — R2689 Other abnormalities of gait and mobility: Secondary | ICD-10-CM

## 2017-11-11 DIAGNOSIS — M6289 Other specified disorders of muscle: Secondary | ICD-10-CM | POA: Diagnosis not present

## 2017-11-11 NOTE — Therapy (Signed)
[facility] [address] [place], [place], [address] Phone: [phone]   Fax:  [phone]  Physical Therapy Treatment  Patient Details  Name: Amy Bray MRN: [id number] Date of Birth: [birth year] Referring Provider (PT): Bess Harvest, MD   Encounter Date: 11/11/2017  PT End of Session - 11/11/17 2032    Visit Number  12    Number of Visits  17    Date for PT Re-Evaluation  12/01/17    Authorization Type  medicare and [employer]    PT Start Time  1415    PT Stop Time  1500    PT Time Calculation (min)  45 min    Equipment Utilized During Treatment  Other (comment)   aqua jogger flotation belt      Past Medical History:  Diagnosis Date  . Cervical myelopathy ([place])   . Multiple sclerosis ([place])   . Osteoporosis 2018   hip    Past Surgical History:  Procedure Laterality Date  . CERVICAL FUSION  01/2001, 01/2004   C3-C6    There were no vitals filed for this visit.  Subjective Assessment - 11/11/17 2029    Subjective  Pt reports no problems - amb. with rollator into pool area and to pool entrance    Pertinent History  HTN, cervical myelopathy, 2 prior neck surgeries.      Patient Stated Goals  "Improving balance and picking my feet up."  Specific issues:  getting up from a chair, patient's confidence    Currently in Pain?  Yes    Pain Score  3     Pain Location  Back    Pain Orientation  Lower    Pain Descriptors / Indicators  Aching    Pain Type  Chronic pain    Pain Onset  More than a month ago      Aquatic therapy:  Pool temp. 87 degrees; Floreen Comber, SPT present and assisted in session   Patient seen for aquatic therapy today.  Treatment took place in water 3.5-4 feet deep depending upon activity.  Pt entered and exited the pool via Step negotiation with use of bil. Hand rails - step by step sequence with ascension and descension  Pt performed gait training in 4' depth - approx. 25' forwards x  2 reps, sideways and backwards 2 reps each direction without UE support with use of buoyancy for  Support   Pt performed forward, back and side kicks 10 reps each leg initially without use of leg flotation belts; then flotation leg buoy added to each leg for increased resistance With viscosity of water - pt performed hip flexion, abduction and hip extension with knee flexed to reduce increased lumbar lordosis with this exercise - 10 reps each leg each Direction  Pt performed squatting - with Heavy and reverse heavy concept - 2 reps each with 5 sec hold - isometric contractions using buoyancy of water for support  Pt gait trained approx. 30'  X 2 reps forward with use of Aquajogger placed in front to facilitate increased upright posture; backwards 30'x 2 reps for increased knee flexion and to facilitate Trunk control with upright posture  Session ended with pt in supine with use of noodle behind neck and under arms - assistance to increase flotation of pelvis - - pt performed hip abduction, single knee to chest with manual resistance And double knee to chest with resistance 10 reps each Ended with gentle rocking and swaying LE's and pelvis  side to side with pt in supine ([place] Ragaz technique)  Pt requires aquatic therapy for buoyancy of water for support and to be able to off load her body while minimizing fall risk; viscosity of water needed to provide resistance for strengthening trunk and LE musculature                         PT Short Term Goals - 10/30/17 1249      PT SHORT TERM GOAL #1   Title  The patient will return demo HEP for LE strengthening, stretching, and general balance/mobility. (STG date 11/01/2017)    Time  4    Period  Weeks    Status  Achieved      PT SHORT TERM GOAL #2   Title  Perform Berg balance test with LTG to follow.     Baseline  10/11: Berg balance score of 36/56 indicating high risk for falls     Time  4    Period  Weeks    Status   Achieved      PT SHORT TERM GOAL #3   Title  The patient will improve gait speed from 0.54 ft/sec to > or equal to 0.8 ft/sec to demo improving mobility.    Baseline  10/11: Pt demonstrates gait speed of 0.62 ft/sec using her rollator     Time  4    Period  Weeks    Status  Not Met      PT SHORT TERM GOAL #4   Title  Further assess/ trial AFOs in clinic for bilateral knee control and foot drop.    Baseline  10/11: Pt reports usage of AFO's in the past with no success and is happy with her bilateral foot up braces.     Time  4    Period  Weeks    Status  Deferred        PT Long Term Goals - 10/30/17 1251      PT LONG TERM GOAL #1   Title  The patient will return demo progression of HEP for post d/c routine. (LTG due 12/01/2017)    Time  8    Period  Weeks      PT LONG TERM GOAL #2   Title  The patient will improve gait speed from 0.54 ft/sec to > or equal to 1.2 ft/sec to demo improving household ambulation/ gait efficiency.    Time  8    Period  Weeks      PT LONG TERM GOAL #3   Title  The patient will ambulate >300 ft nonstop with rollator RW mod indep for limited community mobility (walking into/out of appointments).      Time  8    Period  Weeks      PT LONG TERM GOAL #4   Title  The patient will move sit<>stand 5/5 trial with UE support without requiring multiple attempts.     Time  8    Period  Weeks      PT LONG TERM GOAL #5   Title  The patient will be able to lift LEs into car without use of UE support to demo improving hip flexion strength.    Time  8      PT LONG TERM GOAL #6   Title  patient to improve Berg to >/= 38/56 demonstrating reduced fall risk    Time  8    Period  Weeks  Plan - 11/11/17 2035    Clinical Impression Statement  Pt demonstrated decreased trunk control with Ai Chi postures with LOB with UE movement with trunk rotation;  pt required short frequent rest periods due to fatigue but tolerated aquatic exercises well  overall.  Flotation belt placed in front assisted pt to achieve more upright posture during gait training.    Rehab Potential  Good    PT Frequency  2x / week    PT Duration  8 weeks    PT Treatment/Interventions  ADLs/Self Care Home Management;Balance training;Neuromuscular re-education;Patient/family education;Gait training;Stair training;Functional mobility training;Therapeutic activities;Therapeutic exercise;Manual techniques;DME Instruction;Aquatic Therapy;Orthotic Fit/Training    PT Next Visit Plan  [facility] available for AT on 11-25-17 - pt has 1:30 aquatic therapy appt  (please confirm with pt) ; Hip abductor strengthening, balance dec'ing UE support.  *Gait with toe caps to determine if patient wants to have shoes modified to allow for improved foot clearance.    Consulted and Agree with Plan of Care  Patient       Patient will benefit from skilled therapeutic intervention in order to improve the following deficits and impairments:  Abnormal gait, Impaired sensation, Impaired tone, Decreased activity tolerance, Decreased strength, Pain, Decreased balance, Decreased mobility, Difficulty walking, Increased muscle spasms, Decreased range of motion, Impaired flexibility  Visit Diagnosis: Other abnormalities of gait and mobility  Muscle weakness (generalized)     Problem List Patient Active Problem List   Diagnosis Date Noted  . Quadriplegia, unspecified ([place]) 12/24/2016  . Spastic paresis 09/11/2016  . Multiple sclerosis ([place]) 09/11/2016  . Muscle spasticity 10/31/2013  . Nerve root pain 01/31/2013  . Cervical myelopathy ([place]) 05/05/2011  . BP (high blood pressure) 05/05/2011  . Restless leg 05/05/2011  . Arthralgia, sacroiliac 05/05/2011    [place], PT 11/11/2017, 8:43 PM  [facility] [address] [place], [place], [address] Phone: [phone]   Fax:  [phone]  Name: Amy Bray MRN:  [id number] Date of Birth: Aug 03, 1945

## 2017-11-12 ENCOUNTER — Ambulatory Visit: Payer: Medicare Other | Admitting: Rehabilitative and Restorative Service Providers"

## 2017-11-12 ENCOUNTER — Encounter: Payer: Self-pay | Admitting: Rehabilitative and Restorative Service Providers"

## 2017-11-12 DIAGNOSIS — R2689 Other abnormalities of gait and mobility: Secondary | ICD-10-CM

## 2017-11-12 DIAGNOSIS — M6281 Muscle weakness (generalized): Secondary | ICD-10-CM

## 2017-11-12 DIAGNOSIS — M6289 Other specified disorders of muscle: Secondary | ICD-10-CM | POA: Diagnosis not present

## 2017-11-12 NOTE — Therapy (Addendum)
[facility] [address] [place], [place], [address] Phone: [phone]   Fax:  [phone]  Physical Therapy Treatment  Patient Details  Name: Amy Bray MRN: [id number] Date of Birth: 10/29/1945 Referring Provider (PT): Bess Harvest, MD   Encounter Date: 11/12/2017  PT End of Session - 11/12/17 1203    Visit Number  13    Number of Visits  17    Date for PT Re-Evaluation  12/01/17    Authorization Type  medicare and AARP    PT Start Time  1100    PT Stop Time  1145    PT Time Calculation (min)  45 min    Equipment Utilized During Treatment  --   aqua jogger flotation belt   Activity Tolerance  Patient tolerated treatment well    Behavior During Therapy  WFL for tasks assessed/performed       Past Medical History:  Diagnosis Date  . Cervical myelopathy ([place])   . Multiple sclerosis ([place])   . Osteoporosis 2018   hip    Past Surgical History:  Procedure Laterality Date  . CERVICAL FUSION  01/2001, 01/2004   C3-C6    There were no vitals filed for this visit.  Subjective Assessment - 11/12/17 1109    Subjective  Reports she enjoyed aquatic therapy and noticed she was able to get into her shower this morning with no issues. Reports her exercises are going well at home with no issues.     Pertinent History  HTN, cervical myelopathy, 2 prior neck surgeries.      Patient Stated Goals  "Improving balance and picking my feet up."  Specific issues:  getting up from a chair, patient's confidence    Currently in Pain?  Yes    Pain Score  7     Pain Location  Back    Pain Orientation  Lower    Pain Descriptors / Indicators  Aching    Pain Type  Chronic pain    Pain Onset  More than a month ago    Pain Frequency  Constant    Aggravating Factors   sitting     Pain Relieving Factors  Injections- Reports last inject on Monday 10/21 with relief of pain.                        [facility]  PT Treatment/Exercise - 11/12/17 1147      Transfers   Transfers  Sit to Stand;Stand to [employer]    Sit to Stand  5: Supervision    Stand to Sit  5: Supervision    Stand Pivot Transfers  5: Supervision      Ambulation/Gait   Ambulation/Gait  Yes    Ambulation/Gait Assistance  5: Supervision    Ambulation/Gait Assistance Details  Pt performs x3 ambulation trials without toe caps, with toe caps, and with toe up brace adjusted for improved clearance and toe caps applied. Pt demonstrates improvement in gait speed with toe covers applied bilaterally using her rollator demonstrating mild improvement in toe clearance. Therapist adjusted bilateral toe up braces to assess improvement in toe clearance during swing phase of gait. Pt demonstrates mild improvement in toe clearance with adjustments.     Ambulation Distance (Feet)  300 Feet    Assistive device  4-wheeled walker    Gait Pattern  Step-through pattern;Decreased stride length;Decreased step length - right;Decreased step length - left;Poor foot clearance - left;Poor  foot clearance - right;Right foot flat;Left foot flat    Ambulation Surface  Level;Indoor    Gait velocity  0.74 ft/sec with rollator; 0.83 ft/sec with rollator and bilateral toe caps      Therapeutic Activites    Therapeutic Activities  Other Therapeutic Activities    Other Therapeutic Activities  Pt performs forward stepping in // bars with green resistance band around distal thighs for improved hip abduction activation during activity. Pt performs alternating forward stepping to visual targets. PT progresses patient to stepping over black beam with 2nd beam maintaining proper step width. Pt removes resistance band 2/2 to causing patient to compensate with circumduction for proper clearence .Pt demonstrates improvement in forward stepping over beam with band removed with therapist providing cues for reinforcement of proper step length and width. Pt attempts to step  over hurdles of various heights; however, unable to properly clear 2/2 to weakness and poor DF.               PT Education - 11/12/17 1203    Education Details  Therapist provided education regarding clinical findings of gait speed assessment, implications for toe caps, and POC moving forward with land and aquatic therapy appointments.     Person(s) Educated  Patient    Methods  Explanation    Comprehension  Verbalized understanding       PT Short Term Goals - 10/30/17 1249      PT SHORT TERM GOAL #1   Title  The patient will return demo HEP for LE strengthening, stretching, and general balance/mobility. (STG date 11/01/2017)    Time  4    Period  Weeks    Status  Achieved      PT SHORT TERM GOAL #2   Title  Perform Berg balance test with LTG to follow.     Baseline  10/11: Berg balance score of 36/56 indicating high risk for falls     Time  4    Period  Weeks    Status  Achieved      PT SHORT TERM GOAL #3   Title  The patient will improve gait speed from 0.54 ft/sec to > or equal to 0.8 ft/sec to demo improving mobility.    Baseline  10/11: Pt demonstrates gait speed of 0.62 ft/sec using her rollator     Time  4    Period  Weeks    Status  Not Met      PT SHORT TERM GOAL #4   Title  Further assess/ trial AFOs in clinic for bilateral knee control and foot drop.    Baseline  10/11: Pt reports usage of AFO's in the past with no success and is happy with her bilateral foot up braces.     Time  4    Period  Weeks    Status  Deferred        PT Long Term Goals - 10/30/17 1251      PT LONG TERM GOAL #1   Title  The patient will return demo progression of HEP for post d/c routine. (LTG due 12/01/2017)    Time  8    Period  Weeks      PT LONG TERM GOAL #2   Title  The patient will improve gait speed from 0.54 ft/sec to > or equal to 1.2 ft/sec to demo improving household ambulation/ gait efficiency.    Time  8    Period  Weeks      PT  LONG TERM GOAL #3   Title   The patient will ambulate >300 ft nonstop with rollator RW mod indep for limited community mobility (walking into/out of appointments).      Time  8    Period  Weeks      PT LONG TERM GOAL #4   Title  The patient will move sit<>stand 5/5 trial with UE support without requiring multiple attempts.     Time  8    Period  Weeks      PT LONG TERM GOAL #5   Title  The patient will be able to lift LEs into car without use of UE support to demo improving hip flexion strength.    Time  8      PT LONG TERM GOAL #6   Title  patient to improve Berg to >/= 38/56 demonstrating reduced fall risk    Time  8    Period  Weeks            Plan - 11/12/17 1204    Clinical Impression Statement  Today's skilled session focused on assessment of gait speed with and without bilateral toe caps and implementation of functional activity for improving bilateral step length with band resistance and visual feed back. Pt demonstrates improvement in gait speed with toe caps applied indicated by her gait speed of  0.74 ft/sec without toe caps and 0.83 ft/sec with toe caps applied bilaterally. Pt demonstrates significant improvement in bilateral step length with visual feedback for proper technique. Patient will continue to benefit from skilled PT to address strength, balance, and functional mobility deficits.     Rehab Potential  Good    PT Frequency  2x / week    PT Duration  8 weeks    PT Treatment/Interventions  ADLs/Self Care Home Management;Balance training;Neuromuscular re-education;Patient/family education;Gait training;Stair training;Functional mobility training;Therapeutic activities;Therapeutic exercise;Manual techniques;DME Instruction;Aquatic Therapy;Orthotic Fit/Training    PT Next Visit Plan  GAC available for AT on 11-25-17 - pt has 1:30 aquatic therapy appt  (please confirm with pt) ; outdoor ambulation, Hip abductor strengthening, balance dec'ing UE support.  *Gait with toe caps to determine if patient  wants to have shoes modified to allow for improved foot clearance.    Consulted and Agree with Plan of Care  Patient       Patient will benefit from skilled therapeutic intervention in order to improve the following deficits and impairments:  Abnormal gait, Impaired sensation, Impaired tone, Decreased activity tolerance, Decreased strength, Pain, Decreased balance, Decreased mobility, Difficulty walking, Increased muscle spasms, Decreased range of motion, Impaired flexibility  Visit Diagnosis: Other abnormalities of gait and mobility  Muscle weakness (generalized)     Problem List Patient Active Problem List   Diagnosis Date Noted  . Quadriplegia, unspecified ([facility]) 12/24/2016  . Spastic paresis 09/11/2016  . Multiple sclerosis ([place]) 09/11/2016  . Muscle spasticity 10/31/2013  . Nerve root pain 01/31/2013  . Cervical myelopathy ([facility]) 05/05/2011  . BP (high blood pressure) 05/05/2011  . Restless leg 05/05/2011  . Arthralgia, sacroiliac 05/05/2011    Floreen Comber, SPT 11/12/2017, 12:08 PM   This entire session was performed under direct supervision and direction of a licensed therapist/therapist assistant . I have personally read, edited and approve of the note as written. Rudell Cobb, [facility] [address] [place], [place], [address] Phone: [phone]   Fax:  [phone]  Name: Amy Bray MRN: [id number] Date of Birth: 1945/02/22

## 2017-11-17 ENCOUNTER — Ambulatory Visit: Payer: Medicare Other | Admitting: Physical Therapy

## 2017-11-17 ENCOUNTER — Encounter: Payer: Self-pay | Admitting: Physical Therapy

## 2017-11-17 DIAGNOSIS — M6289 Other specified disorders of muscle: Secondary | ICD-10-CM | POA: Diagnosis not present

## 2017-11-17 DIAGNOSIS — R2689 Other abnormalities of gait and mobility: Secondary | ICD-10-CM

## 2017-11-17 DIAGNOSIS — M6281 Muscle weakness (generalized): Secondary | ICD-10-CM | POA: Diagnosis not present

## 2017-11-17 NOTE — Therapy (Signed)
[facility] [address] [place], [place], [address] Phone: [phone]   Fax:  [phone]  Physical Therapy Treatment  Patient Details  Name: Amy Bray MRN: [id number] Date of Birth: 08-Jul-1945 Referring Provider (PT): Bess Harvest, MD   Encounter Date: 11/17/2017  PT End of Session - 11/17/17 2312    Visit Number  14    Number of Visits  17    Date for PT Re-Evaluation  12/01/17    Authorization Type  medicare and AARP    PT Start Time  1109    PT Stop Time  1147    PT Time Calculation (min)  38 min    Equipment Utilized During Treatment  --    Activity Tolerance  Patient tolerated treatment well    Behavior During Therapy  [facility] for tasks assessed/performed       Past Medical History:  Diagnosis Date  . Cervical myelopathy ([place])   . Multiple sclerosis ([place])   . Osteoporosis 2018   hip    Past Surgical History:  Procedure Laterality Date  . CERVICAL FUSION  01/2001, 01/2004   C3-C6    There were no vitals filed for this visit.  Subjective Assessment - 11/17/17 1112    Subjective  coccyx pain really bad today. Not really interested in toe caps as it did not change her speed significantly.     Pertinent History  HTN, cervical myelopathy, 2 prior neck surgeries.      Patient Stated Goals  "Improving balance and picking my feet up."  Specific issues:  getting up from a chair, patient's confidence    Currently in Pain?  Yes    Pain Score  9     Pain Location  Coccyx    Pain Descriptors / Indicators  Aching    Pain Type  Chronic pain    Pain Onset  More than a month ago    Pain Frequency  Constant    Aggravating Factors   any activity    Pain Relieving Factors  injections                       [facility] Adult PT Treatment/Exercise - 11/17/17 1127      Transfers   Transfers  Sit to Stand;Stand to Sit    Sit to Stand  5: Supervision    Sit to Stand Details (indicate cue type and  reason)  vc for sufficient ant wt-shift     Stand to Sit  5: Supervision    Stand to Sit Details  vc for sufficient ant wt-shift     Number of Reps  10 reps   standing on 1" foam     Ambulation/Gait   Ambulation/Gait Assistance  6: Modified independent (Device/Increase time)    Ambulation/Gait Assistance Details  poor foot clearanc (left worse than right initially--became more symmetrical throughout session)    Ambulation Distance (Feet)  40 Feet   50   Assistive device  Rollator    Gait Pattern  Step-through pattern;Decreased stride length;Decreased step length - right;Decreased step length - left;Poor foot clearance - left;Poor foot clearance - right;Right foot flat;Left foot flat;Decreased dorsiflexion - right;Decreased dorsiflexion - left    Ambulation Surface  Level;Indoor      Knee/Hip Exercises: Machines for Strengthening   Cybex Leg Press  40 # x 15 reps with ball between knees; 50# x 10 reps; 50# with Yellow band around thighs and hipabdct x 5  reps x 2;                PT Short Term Goals - 10/30/17 1249      PT SHORT TERM GOAL #1   Title  The patient will return demo HEP for LE strengthening, stretching, and general balance/mobility. (STG date 11/01/2017)    Time  4    Period  Weeks    Status  Achieved      PT SHORT TERM GOAL #2   Title  Perform Berg balance test with LTG to follow.     Baseline  10/11: Berg balance score of 36/56 indicating high risk for falls     Time  4    Period  Weeks    Status  Achieved      PT SHORT TERM GOAL #3   Title  The patient will improve gait speed from 0.54 ft/sec to > or equal to 0.8 ft/sec to demo improving mobility.    Baseline  10/11: Pt demonstrates gait speed of 0.62 ft/sec using her rollator     Time  4    Period  Weeks    Status  Not Met      PT SHORT TERM GOAL #4   Title  Further assess/ trial AFOs in clinic for bilateral knee control and foot drop.    Baseline  10/11: Pt reports usage of AFO's in the past with no  success and is happy with her bilateral foot up braces.     Time  4    Period  Weeks    Status  Deferred        PT Long Term Goals - 10/30/17 1251      PT LONG TERM GOAL #1   Title  The patient will return demo progression of HEP for post d/c routine. (LTG due 12/01/2017)    Time  8    Period  Weeks      PT LONG TERM GOAL #2   Title  The patient will improve gait speed from 0.54 ft/sec to > or equal to 1.2 ft/sec to demo improving household ambulation/ gait efficiency.    Time  8    Period  Weeks      PT LONG TERM GOAL #3   Title  The patient will ambulate >300 ft nonstop with rollator RW mod indep for limited community mobility (walking into/out of appointments).      Time  8    Period  Weeks      PT LONG TERM GOAL #4   Title  The patient will move sit<>stand 5/5 trial with UE support without requiring multiple attempts.     Time  8    Period  Weeks      PT LONG TERM GOAL #5   Title  The patient will be able to lift LEs into car without use of UE support to demo improving hip flexion strength.    Time  8      PT LONG TERM GOAL #6   Title  patient to improve Berg to >/= 38/56 demonstrating reduced fall risk    Time  8    Period  Weeks            Plan - 11/17/17 2314    Clinical Impression Statement  Skilled session focused on LE strengthening (especially hip abductors & extensors), gait training, and decision re: pursuing leather toe caps for her shoes (she does not want to pursue). Patient is planning to  continue aquatic exercise program upon discharge and will have 2-3 more pool therapy appts to finalize her exercises. Patient can continue to benefit from PT to maximize strengthening and safety with ambulation.     Rehab Potential  Good    PT Frequency  2x / week    PT Duration  8 weeks    PT Treatment/Interventions  ADLs/Self Care Home Management;Balance training;Neuromuscular re-education;Patient/family education;Gait training;Stair training;Functional mobility  training;Therapeutic activities;Therapeutic exercise;Manual techniques;DME Instruction;Aquatic Therapy;Orthotic Fit/Training    PT Next Visit Plan  goal walk 380f; hip flexor strengthening for lifting feet into car without use of UEs; outdoor ambulation, Hip abductor strengthening, balance dec'ing UE support.      Consulted and Agree with Plan of Care  Patient       Patient will benefit from skilled therapeutic intervention in order to improve the following deficits and impairments:  Abnormal gait, Impaired sensation, Impaired tone, Decreased activity tolerance, Decreased strength, Pain, Decreased balance, Decreased mobility, Difficulty walking, Increased muscle spasms, Decreased range of motion, Impaired flexibility  Visit Diagnosis: Other abnormalities of gait and mobility  Muscle weakness (generalized)     Problem List Patient Active Problem List   Diagnosis Date Noted  . Quadriplegia, unspecified ([place] 12/24/2016  . Spastic paresis 09/11/2016  . Multiple sclerosis ([place] 09/11/2016  . Muscle spasticity 10/31/2013  . Nerve root pain 01/31/2013  . Cervical myelopathy ([place] 05/05/2011  . BP (high blood pressure) 05/05/2011  . Restless leg 05/05/2011  . Arthralgia, sacroiliac 05/05/2011    LRexanne Mano PT 11/17/2017, 11:23 PM  [address] [address]Phone: [phone]  Fax:  [phone] Name: JGUINEVERE STEPHENSONMRN: [id number] of Birth: 103-Jan-1947

## 2017-11-18 ENCOUNTER — Ambulatory Visit: Payer: Medicare Other | Admitting: Physical Therapy

## 2017-11-18 DIAGNOSIS — M6281 Muscle weakness (generalized): Secondary | ICD-10-CM | POA: Diagnosis not present

## 2017-11-18 DIAGNOSIS — M6289 Other specified disorders of muscle: Secondary | ICD-10-CM | POA: Diagnosis not present

## 2017-11-18 DIAGNOSIS — R2689 Other abnormalities of gait and mobility: Secondary | ICD-10-CM

## 2017-11-18 DIAGNOSIS — G35 Multiple sclerosis: Secondary | ICD-10-CM | POA: Diagnosis not present

## 2017-11-19 ENCOUNTER — Ambulatory Visit: Payer: Medicare Other | Admitting: Physical Therapy

## 2017-11-19 NOTE — Therapy (Signed)
[facility] [address] [place], [place], [address] Phone: [phone]   Fax:  [phone]  Physical Therapy Treatment  Patient Details  Name: Amy Bray MRN: [id number] Date of Birth: [birth year] Referring Provider (PT): Bess Harvest, MD   Encounter Date: 11/18/2017  PT End of Session - 11/19/17 1255    Visit Number  15    Number of Visits  17    Date for PT Re-Evaluation  12/01/17    Authorization Type  medicare and AARP    PT Start Time  1430   pt 15" late for appt   PT Stop Time  1500    PT Time Calculation (min)  30 min       Past Medical History:  Diagnosis Date  . Cervical myelopathy ([place])   . Multiple sclerosis ([place])   . Osteoporosis 2018   hip    Past Surgical History:  Procedure Laterality Date  . CERVICAL FUSION  01/2001, 01/2004   C3-C6    There were no vitals filed for this visit.  Subjective Assessment - 11/19/17 1250    Subjective  Pt arrived at 2:10 pm for 2:15 aquatic therapy appt. - had to change clothes, realized husband had left bag with bathing suit in car; pt ready for aquatic therapy at 2:30 pm; pt had infusion at 11:00 prior to pool therapy today    Pertinent History  HTN, cervical myelopathy, 2 prior neck surgeries.      Patient Stated Goals  "Improving balance and picking my feet up."  Specific issues:  getting up from a chair, patient's confidence    Currently in Pain?  No/denies       Aquatic Therapy:  Pool temp 87 degrees;  Floreen Comber, SPT present and assisted with session  Patient seen for aquatic therapy today.  Treatment took place in water 3.5-4 feet deep depending upon activity.  Pt entered the pool via Steps with CGA to min assist for safety with use of bil. Hand rails  Pt performed stretches for bil. Hip flexors using ankle cuff flotation weights for incr. Buoyancy for increased stretch; Pt performed hamstring/heel cord stretch (runner's stretch)   Pt  performed hip extension, flexion and abduction without cuff weight initially 10 reps using viscosity of water for resistance 10 reps each leg Added cuff flotation weight - pt performed hip extension and abduction RLE and LLE 10 reps each  Pt performed forward, backwards and sideways ambulation 25' x 2 reps each direction without UE support - cues for incr. Step length  Pt performed water walking with Floreen Comber, SPT assisting for 10" after session completed with PT - NO CHARGE for this as not directly supervised as A one-on-one visit with supervising PT                          PT Short Term Goals - 10/30/17 1249      PT SHORT TERM GOAL #1   Title  The patient will return demo HEP for LE strengthening, stretching, and general balance/mobility. (STG date 11/01/2017)    Time  4    Period  Weeks    Status  Achieved      PT SHORT TERM GOAL #2   Title  Perform Berg balance test with LTG to follow.     Baseline  10/11: Berg balance score of 36/56 indicating high risk for falls     Time  4  Period  Weeks    Status  Achieved      PT SHORT TERM GOAL #3   Title  The patient will improve gait speed from 0.54 ft/sec to > or equal to 0.8 ft/sec to demo improving mobility.    Baseline  10/11: Pt demonstrates gait speed of 0.62 ft/sec using her rollator     Time  4    Period  Weeks    Status  Not Met      PT SHORT TERM GOAL #4   Title  Further assess/ trial AFOs in clinic for bilateral knee control and foot drop.    Baseline  10/11: Pt reports usage of AFO's in the past with no success and is happy with her bilateral foot up braces.     Time  4    Period  Weeks    Status  Deferred        PT Long Term Goals - 10/30/17 1251      PT LONG TERM GOAL #1   Title  The patient will return demo progression of HEP for post d/c routine. (LTG due 12/01/2017)    Time  8    Period  Weeks      PT LONG TERM GOAL #2   Title  The patient will improve gait speed from 0.54  ft/sec to > or equal to 1.2 ft/sec to demo improving household ambulation/ gait efficiency.    Time  8    Period  Weeks      PT LONG TERM GOAL #3   Title  The patient will ambulate >300 ft nonstop with rollator RW mod indep for limited community mobility (walking into/out of appointments).      Time  8    Period  Weeks      PT LONG TERM GOAL #4   Title  The patient will move sit<>stand 5/5 trial with UE support without requiring multiple attempts.     Time  8    Period  Weeks      PT LONG TERM GOAL #5   Title  The patient will be able to lift LEs into car without use of UE support to demo improving hip flexion strength.    Time  8      PT LONG TERM GOAL #6   Title  patient to improve Berg to >/= 38/56 demonstrating reduced fall risk    Time  8    Period  Weeks            Plan - 11/19/17 1257    Clinical Impression Statement  Pt arrived 15" late for aquatic PT appt due to arriving late and accidently leaving pool bag with swim suit in car, which husband retrieved.  Session focused on water walking with cues to take big steps, LE strengthening and balance.  Pt continues to have decreased standing balance and trunk control with SLS activities in water but is able to recover with CGA to min assist.  Pt significantly benefits from use of aquatic therapy for strengthening with use of buoyancy of water for support and use of viscosity for resistance iwith strengthening.  Pt reports she is able to move much easier in the water than she is able to do so on land.    Rehab Potential  Good    PT Frequency  2x / week    PT Duration  8 weeks    PT Treatment/Interventions  ADLs/Self Care Home Management;Balance training;Neuromuscular re-education;Patient/family education;Gait training;Stair training;Functional  mobility training;Therapeutic activities;Therapeutic exercise;Manual techniques;DME Instruction;Aquatic Therapy;Orthotic Fit/Training    PT Next Visit Plan  goal walk 312f; hip flexor  strengthening for lifting feet into car without use of UEs; outdoor ambulation, Hip abductor strengthening, balance dec'ing UE support.      Consulted and Agree with Plan of Care  Patient       Patient will benefit from skilled therapeutic intervention in order to improve the following deficits and impairments:  Abnormal gait, Impaired sensation, Impaired tone, Decreased activity tolerance, Decreased strength, Pain, Decreased balance, Decreased mobility, Difficulty walking, Increased muscle spasms, Decreased range of motion, Impaired flexibility  Visit Diagnosis: Other abnormalities of gait and mobility  Muscle weakness (generalized)     Problem List Patient Active Problem List   Diagnosis Date Noted  . Quadri[place]gia, unspecified ([place] 12/24/2016  . Spastic paresis 09/11/2016  . Multiple sclerosis ([place] 09/11/2016  . Muscle spasticity 10/31/2013  . Nerve root pain 01/31/2013  . Cervical myelopathy ([place] 05/05/2011  . BP (high blood pressure) 05/05/2011  . Restless leg 05/05/2011  . Arthralgia, sacroiliac 05/05/2011    Sherhonda Gaspar, [place]Jen[place] PT 11/19/2017, 2:09 PM  [facility] CourtSRipon[place]old[place] [address]Phone: [phone]  Fax:  [phone] Name: Amy BODINMRN: [id number] of Birth: 1October 01, 1947

## 2017-11-23 ENCOUNTER — Ambulatory Visit: Payer: Medicare Other | Attending: Diagnostic Neuroimaging | Admitting: Physical Therapy

## 2017-11-23 ENCOUNTER — Encounter: Payer: Self-pay | Admitting: Physical Therapy

## 2017-11-23 DIAGNOSIS — R2689 Other abnormalities of gait and mobility: Secondary | ICD-10-CM | POA: Insufficient documentation

## 2017-11-23 DIAGNOSIS — M6281 Muscle weakness (generalized): Secondary | ICD-10-CM

## 2017-11-23 NOTE — Therapy (Signed)
[facility] [address] [place], [place], [address] Phone: [phone]   Fax:  [phone]  Physical Therapy Treatment  Patient Details  Name: Amy Bray MRN: [id number] Date of Birth: 12-27-1945 Referring Provider (PT): Bess Harvest, MD   Encounter Date: 11/23/2017  PT End of Session - 11/23/17 1249    Visit Number  16    Number of Visits  17    Date for PT Re-Evaluation  12/01/17    Authorization Type  medicare and [employer]    PT Start Time  1145    PT Stop Time  1230    PT Time Calculation (min)  45 min    Activity Tolerance  Patient tolerated treatment well    Behavior During Therapy  [facility] for tasks assessed/performed       Past Medical History:  Diagnosis Date  . Cervical myelopathy ([place])   . Multiple sclerosis ([place])   . Osteoporosis 2018   hip    Past Surgical History:  Procedure Laterality Date  . CERVICAL FUSION  01/2001, 01/2004   C3-C6    There were no vitals filed for this visit.  Subjective Assessment - 11/23/17 1243    Subjective  Pt reports everything has been going well with no change in status. Reports her HEP is going well at home with noticable improvements with her functional mobility.     Pertinent History  HTN, cervical myelopathy, 2 prior neck surgeries.      Patient Stated Goals  "Improving balance and picking my feet up."  Specific issues:  getting up from a chair, patient's confidence    Currently in Pain?  Yes    Pain Score  4     Pain Location  Coccyx    Pain Orientation  Lower    Pain Descriptors / Indicators  Aching    Pain Type  Chronic pain    Pain Onset  More than a month ago    Pain Frequency  Constant    Aggravating Factors   activity and movement    Pain Relieving Factors  injections         [facility] PT Assessment - 11/23/17 1209      Standardized Balance Assessment   Standardized Balance Assessment  Berg Balance Test      Berg Balance Test   Sit to Stand   Able to stand  independently using hands    Standing Unsupported  Able to stand safely 2 minutes    Sitting with Back Unsupported but Feet Supported on Floor or Stool  Able to sit safely and securely 2 minutes    Stand to Sit  Controls descent by using hands    Transfers  Able to transfer safely, definite need of hands    Standing Unsupported with Eyes Closed  Able to stand 10 seconds with supervision    Standing Ubsupported with Feet Together  Able to place feet together independently and stand 1 minute safely    From Standing, Reach Forward with Outstretched Arm  Can reach forward >12 cm safely (5")    From Standing Position, Pick up Object from Floor  Able to pick up shoe safely and easily    From Standing Position, Turn to Look Behind Over each Shoulder  Looks behind one side only/other side shows less weight shift    Turn 360 Degrees  Able to turn 360 degrees safely but slowly    Standing Unsupported, Alternately Place Feet on Step/Stool  Needs  assistance to keep from falling or unable to try    Standing Unsupported, One Foot in [place] to take small step independently and hold 30 seconds    Standing on One Leg  Tries to lift leg/unable to hold 3 seconds but remains standing independently    Total Score  39    Berg comment:  39/56 indicative of significant fall risk potential          [facility] Adult PT Treatment/Exercise - 11/23/17 1244      Transfers   Transfers  Sit to Stand;Stand to Sit    Sit to Stand  5: Supervision    Sit to Stand Details (indicate cue type and reason)  Therapist provides verbal cues for proper technique and to dec reliance on posterior thighs on chair to achieve stand.     Stand to Sit  5: Supervision    Stand Pivot Transfers  5: Supervision    Number of Reps  Other reps (comment)   5   Comments  Pt able to achieve stands and stand pivot transfers from standard height chair with minimal use of her UE's. Therapist cues patient to perform eccentric lower  with patient able to demonstrate good carry over with minimial use of UE's.       Ambulation/Gait   Ambulation/Gait  Yes    Ambulation/Gait Assistance  6: Modified independent (Device/Increase time)    Ambulation/Gait Assistance Details  Pt ambulates 332 feet on paved and level surfaces with her rollator at mod I level     Ambulation Distance (Feet)  332 Feet    Assistive device  Rollator    Gait Pattern  Step-through pattern;Decreased stride length;Decreased step length - right;Decreased step length - left;Poor foot clearance - left;Poor foot clearance - right;Right foot flat;Left foot flat;Decreased dorsiflexion - right;Decreased dorsiflexion - left    Ambulation Surface  Level;Indoor;Outdoor;Paved    Gait velocity  0.68 ft/sec with her rollator indicative of house hold ambulator with increased fall risk potential      Therapeutic Activites    Therapeutic Activities  Other Therapeutic Activities    Other Therapeutic Activities  Pt performs 1x10 reps of seated hip flexion with step over 4 inch block to simulate getting in and out of her car .Pt demonstrates increased difficulty performing with R LE in seated position with therapist providing min assist for clearance. Therapist cues patient to perform increased hip flexor activation for improved clearance with patient demonstrating good carry over.          PT Education - 11/23/17 1249    Education Details  Therapist provides education regarding clinical findings of LTG assessment and POC moving forward as d/c date approaches.     Person(s) Educated  Patient    Methods  Explanation    Comprehension  Verbalized understanding       PT Short Term Goals - 10/30/17 1249      PT SHORT TERM GOAL #1   Title  The patient will return demo HEP for LE strengthening, stretching, and general balance/mobility. (STG date 11/01/2017)    Time  4    Period  Weeks    Status  Achieved      PT SHORT TERM GOAL #2   Title  Perform Berg balance test with  LTG to follow.     Baseline  10/11: Berg balance score of 36/56 indicating high risk for falls     Time  4    Period  Weeks  Status  Achieved      PT SHORT TERM GOAL #3   Title  The patient will improve gait speed from 0.54 ft/sec to > or equal to 0.8 ft/sec to demo improving mobility.    Baseline  10/11: Pt demonstrates gait speed of 0.62 ft/sec using her rollator     Time  4    Period  Weeks    Status  Not Met      PT SHORT TERM GOAL #4   Title  Further assess/ trial AFOs in clinic for bilateral knee control and foot drop.    Baseline  10/11: Pt reports usage of AFO's in the past with no success and is happy with her bilateral foot up braces.     Time  4    Period  Weeks    Status  Deferred        PT Long Term Goals - 11/23/17 1250      PT LONG TERM GOAL #1   Title  The patient will return demo progression of HEP for post d/c routine. (LTG due 12/01/2017)    Time  8    Period  Weeks    Status  On-going      PT LONG TERM GOAL #2   Title  The patient will improve gait speed from 0.54 ft/sec to > or equal to 1.2 ft/sec to demo improving household ambulation/ gait efficiency.    Baseline  11/4: Pt demonstrates gait speed of 0.68 ft/sec with her rollator indicative of household ambulation     Time  8    Period  Weeks    Status  Not Met      PT LONG TERM GOAL #3   Title  The patient will ambulate >300 ft nonstop with rollator RW mod indep for limited community mobility (walking into/out of appointments).      Baseline  11/4: patient ambulates 332 feet with her rollator at mod I level     Time  8    Period  Weeks    Status  Achieved      PT LONG TERM GOAL #4   Title  The patient will move sit<>stand 5/5 trial with UE support without requiring multiple attempts.     Baseline  11/4: patient performs 5 STS from standard height chair with UE's     Time  8    Period  Weeks    Status  Achieved      PT LONG TERM GOAL #5   Title  The patient will be able to lift LEs into car  without use of UE support to demo improving hip flexion strength.    Baseline  11/4: patient reports she continues to use her B UE's to lift LE's into car     Time  8    Status  Not Met      PT LONG TERM GOAL #6   Title  patient to improve Berg to >/= 38/56 demonstrating reduced fall risk   11/4: pt meets goal by scoring 39/56 on Berg   Time  8    Period  Weeks            Plan - 11/23/17 1305    Clinical Impression Statement  Skilled session focused on assessment of patient's LTG's. Overall, patient has met 3/6 LTG's, progressing with 1/6 LTG, and has not met 2/6 LTG's. Patient demonstrates reduction in fall risk potential indicated by her Berg score of 39/56. Pt demonstrates improvement in  LE strength and functional mobility indicated by her ability to achieve 5 stands with miniumal use of her UE's without requiring multiple attempts with reliance on momentum to achieve stands. Pt's community ambulation has improved to mod I level with her rollator demonstrated by her ability to ambulate 332 feet. Pt has not met her gait speed goal of >1.2 ft/sec indicated by her gait speed of 0.68 ft/sec which is an improvement; however, not to goal level. Pt reports she continues to have to rely on her UE's to lift her LE's into her car 2/2 to weakness and therapist educated her on exercises to perform at home to further progress towards achieving this goal s/p d/c. Next session, patient will perform aquatic therapy and finalize her HEP.     Rehab Potential  Good    PT Frequency  2x / week    PT Duration  8 weeks    PT Treatment/Interventions  ADLs/Self Care Home Management;Balance training;Neuromuscular re-education;Patient/family education;Gait training;Stair training;Functional mobility training;Therapeutic activities;Therapeutic exercise;Manual techniques;DME Instruction;Aquatic Therapy;Orthotic Fit/Training    PT Next Visit Plan  check of HEP LTG and finalize HEP    Consulted and Agree with Plan of  Care  Patient       Patient will benefit from skilled therapeutic intervention in order to improve the following deficits and impairments:  Abnormal gait, Impaired sensation, Impaired tone, Decreased activity tolerance, Decreased strength, Pain, Decreased balance, Decreased mobility, Difficulty walking, Increased muscle spasms, Decreased range of motion, Impaired flexibility  Visit Diagnosis: Other abnormalities of gait and mobility  Muscle weakness (generalized)     Problem List Patient Active Problem List   Diagnosis Date Noted  . Quadriplegia, unspecified ([place]) 12/24/2016  . Spastic paresis 09/11/2016  . Multiple sclerosis ([place]) 09/11/2016  . Muscle spasticity 10/31/2013  . Nerve root pain 01/31/2013  . Cervical myelopathy ([place]) 05/05/2011  . BP (high blood pressure) 05/05/2011  . Restless leg 05/05/2011  . Arthralgia, sacroiliac 05/05/2011    Floreen Comber, SPT 11/23/2017, 1:10 PM  [facility] [address] [place], [place], [address] Phone: [phone]   Fax:  [phone]  Name: WILMOTH RASNIC MRN: [id number] Date of Birth: [birth year]

## 2017-11-25 ENCOUNTER — Ambulatory Visit: Payer: Medicare Other | Admitting: Physical Therapy

## 2017-11-25 ENCOUNTER — Ambulatory Visit: Payer: Medicare Other | Admitting: Rehabilitative and Restorative Service Providers"

## 2017-11-25 DIAGNOSIS — R2689 Other abnormalities of gait and mobility: Secondary | ICD-10-CM | POA: Diagnosis not present

## 2017-11-25 DIAGNOSIS — M6281 Muscle weakness (generalized): Secondary | ICD-10-CM | POA: Diagnosis not present

## 2017-11-26 NOTE — Therapy (Signed)
[facility] [address] [place], [place], [address] Phone: [phone]   Fax:  [phone]  Physical Therapy Treatment  Patient Details  Name: Amy Bray MRN: [id number] Date of Birth: 03/29/1945 Referring Provider (PT): Bess Harvest, MD   Encounter Date: 11/25/2017  PT End of Session - 11/26/17 2054    Visit Number  17    Number of Visits  17    Date for PT Re-Evaluation  12/01/17    Authorization Type  medicare and AARP    PT Start Time  1320    PT Stop Time  1413    PT Time Calculation (min)  53 min       Past Medical History:  Diagnosis Date  . Cervical myelopathy ([place])   . Multiple sclerosis ([place])   . Osteoporosis 2018   hip    Past Surgical History:  Procedure Laterality Date  . CERVICAL FUSION  01/2001, 01/2004   C3-C6    There were no vitals filed for this visit.  Subjective Assessment - 11/26/17 2052    Subjective  Pt arrived early to PT aquatic session  -  was already in pool walking with use of noodle prior to scheduled appt time    Pertinent History  HTN, cervical myelopathy, 2 prior neck surgeries.      Patient Stated Goals  "Improving balance and picking my feet up."  Specific issues:  getting up from a chair, patient's confidence    Currently in Pain?  No/denies       Aquatic therapy:  Pool temp 87.2 degrees  Patient seen for aquatic therapy today.  Treatment took place in water 3.5-4 feet deep depending upon activity.  Pt entered and Exited the pool via step negotiation with use of hand rails.   Pt performed stretches for bil. Hip flexors using ankle cuff flotation weights for incr. Buoyancy for increased stretch; Pt performed hamstring/heel cord stretch (runner's stretch)   Pt performed hip extension, flexion and abduction with cuff weight 10 reps using viscosity of water for resistance 10 reps each leg Marching in place 10 reps each - cues for big movement and for slow  speed for improved SLS  Pt performed forward, backwards and sideways ambulation 35' x 2 reps each direction without UE support - cues for incr. Step length Pt performed squats x 10 reps with UE support on pool wall  Ai Chi postures performed for trunk stabilization - rotation of trunk with UE movement, weight shift anteriorly/posteriorly with UE's up/back in opposite direction of leg - for improved balance and weight shift   Pt reclined in supine position with use of noodle under arms - pt performed hip abdct./adduction, single knee to chest, bicycling LE's and double knees to chest 10 reps each   Pt requires aquatic therapy for buoyancy of water for support and to be able to off load her body while minimizing fall risk; viscosity of water needed to provide resistance for strengthening trunk and LE musculature                    PT Short Term Goals - 10/30/17 1249      PT SHORT TERM GOAL #1   Title  The patient will return demo HEP for LE strengthening, stretching, and general balance/mobility. (STG date 11/01/2017)    Time  4    Period  Weeks    Status  Achieved      PT SHORT TERM GOAL #  2   Title  Perform Berg balance test with LTG to follow.     Baseline  10/11: Berg balance score of 36/56 indicating high risk for falls     Time  4    Period  Weeks    Status  Achieved      PT SHORT TERM GOAL #3   Title  The patient will improve gait speed from 0.54 ft/sec to > or equal to 0.8 ft/sec to demo improving mobility.    Baseline  10/11: Pt demonstrates gait speed of 0.62 ft/sec using her rollator     Time  4    Period  Weeks    Status  Not Met      PT SHORT TERM GOAL #4   Title  Further assess/ trial AFOs in clinic for bilateral knee control and foot drop.    Baseline  10/11: Pt reports usage of AFO's in the past with no success and is happy with her bilateral foot up braces.     Time  4    Period  Weeks    Status  Deferred        PT Long Term Goals - 11/23/17  1250      PT LONG TERM GOAL #1   Title  The patient will return demo progression of HEP for post d/c routine. (LTG due 12/01/2017)    Time  8    Period  Weeks    Status  On-going      PT LONG TERM GOAL #2   Title  The patient will improve gait speed from 0.54 ft/sec to > or equal to 1.2 ft/sec to demo improving household ambulation/ gait efficiency.    Baseline  11/4: Pt demonstrates gait speed of 0.68 ft/sec with her rollator indicative of household ambulation     Time  8    Period  Weeks    Status  Not Met      PT LONG TERM GOAL #3   Title  The patient will ambulate >300 ft nonstop with rollator RW mod indep for limited community mobility (walking into/out of appointments).      Baseline  11/4: patient ambulates 332 feet with her rollator at mod I level     Time  8    Period  Weeks    Status  Achieved      PT LONG TERM GOAL #4   Title  The patient will move sit<>stand 5/5 trial with UE support without requiring multiple attempts.     Baseline  11/4: patient performs 5 STS from standard height chair with UE's     Time  8    Period  Weeks    Status  Achieved      PT LONG TERM GOAL #5   Title  The patient will be able to lift LEs into car without use of UE support to demo improving hip flexion strength.    Baseline  11/4: patient reports she continues to use her B UE's to lift LE's into car     Time  8    Status  Not Met      PT LONG TERM GOAL #6   Title  patient to improve Berg to >/= 38/56 demonstrating reduced fall risk   11/4: pt meets goal by scoring 39/56 on Berg   Time  8    Period  Weeks            Plan - 11/26/17 2055  Clinical Impression Statement  Pt is progressing well with aquatic exercises; able to maintain balance better, demonstrating improved trunk control and core stabilization.  Pt able to amb. with use of noodle for UE support for stability for safety.  Pt able to independently and safely perform aquatic exercises.      Rehab Potential  Good     PT Frequency  2x / week    PT Duration  8 weeks    PT Treatment/Interventions  ADLs/Self Care Home Management;Balance training;Neuromuscular re-education;Patient/family education;Gait training;Stair training;Functional mobility training;Therapeutic activities;Therapeutic exercise;Manual techniques;DME Instruction;Aquatic Therapy;Orthotic Fit/Training    PT Next Visit Plan  check of HEP LTG and finalize HEP    Consulted and Agree with Plan of Care  Patient       Patient will benefit from skilled therapeutic intervention in order to improve the following deficits and impairments:  Abnormal gait, Impaired sensation, Impaired tone, Decreased activity tolerance, Decreased strength, Pain, Decreased balance, Decreased mobility, Difficulty walking, Increased muscle spasms, Decreased range of motion, Impaired flexibility  Visit Diagnosis: Other abnormalities of gait and mobility  Muscle weakness (generalized)     Problem List Patient Active Problem List   Diagnosis Date Noted  . Quadriplegia, unspecified ([place]) 12/24/2016  . Spastic paresis 09/11/2016  . Multiple sclerosis ([place]) 09/11/2016  . Muscle spasticity 10/31/2013  . Nerve root pain 01/31/2013  . Cervical myelopathy ([place]) 05/05/2011  . BP (high blood pressure) 05/05/2011  . Restless leg 05/05/2011  . Arthralgia, sacroiliac 05/05/2011    [place], PT 11/26/2017, 9:02 PM  [facility] [address] [place], [place], [address] Phone: [phone]   Fax:  [phone]  Name: Amy Bray MRN: [id number] Date of Birth: [birth year]

## 2017-11-30 ENCOUNTER — Telehealth: Payer: Self-pay | Admitting: Diagnostic Neuroimaging

## 2017-11-30 NOTE — Telephone Encounter (Signed)
Pt is asking for call from RN to discuss swelling in feet and ankles for about a month

## 2017-11-30 NOTE — Telephone Encounter (Signed)
May setup follow up appt. May check MRI and labs. -VRP

## 2017-11-30 NOTE — Telephone Encounter (Addendum)
Pt stated that for about 1.5 months, ( R ankle and foot swelling, now L for the last 2 wks).  No SOB.  Got botox 9/11/019 (not had before), then yesterday at drugstore pushing cart she stated that could not left L leg, and was leaning to L.  This lasted for about 2 hours (ok when got home).  She is on tysabri.  No other sx of weakness, incontinence.  Has appt in 12/2017 with Dr. [name] and then you in 01/2018.  ? MS or something else.  Please advise.  Will let her know tomorrow.

## 2017-12-01 ENCOUNTER — Ambulatory Visit: Payer: Medicare Other | Admitting: Physical Therapy

## 2017-12-01 DIAGNOSIS — M6281 Muscle weakness (generalized): Secondary | ICD-10-CM | POA: Diagnosis not present

## 2017-12-01 DIAGNOSIS — R2689 Other abnormalities of gait and mobility: Secondary | ICD-10-CM | POA: Diagnosis not present

## 2017-12-01 NOTE — Telephone Encounter (Signed)
LMVM for pt to return call re: to appt possiblity 11-18 at 1300 or 1500 with Dr. Leta Baptist.

## 2017-12-01 NOTE — Telephone Encounter (Signed)
Made appt for pt 12-07-17 at 1500, arrive 1430.

## 2017-12-01 NOTE — Telephone Encounter (Signed)
Pt returning RNs call, accepted appt for 11/18 check in at 2:30 for 3

## 2017-12-02 ENCOUNTER — Encounter: Payer: Self-pay | Admitting: Physical Therapy

## 2017-12-02 NOTE — Therapy (Signed)
[facility] [address] [place], [place], [address] Phone: [phone]   Fax:  [phone]  Physical Therapy Treatment  Patient Details  Name: Amy Bray MRN: [id number] Date of Birth: Oct 24, 1945 Referring Provider (PT): Bess Harvest, MD   Encounter Date: 12/01/2017  PT End of Session - 12/02/17 1048    Visit Number  18    Number of Visits  17    Date for PT Re-Evaluation  12/01/17    Authorization Type  medicare and AARP    PT Start Time  1505    PT Stop Time  1550    PT Time Calculation (min)  45 min       Past Medical History:  Diagnosis Date  . Cervical myelopathy ([place])   . Multiple sclerosis ([place])   . Osteoporosis 2018   hip    Past Surgical History:  Procedure Laterality Date  . CERVICAL FUSION  01/2001, 01/2004   C3-C6    There were no vitals filed for this visit.  Subjective Assessment - 12/02/17 1047    Subjective  Pt states she is doing well; plans to see about continuing aquatic exercise at [facility] upon D/C, which is scheduled today    Pertinent History  HTN, cervical myelopathy, 2 prior neck surgeries.      Patient Stated Goals  "Improving balance and picking my feet up."  Specific issues:  getting up from a chair, patient's confidence    Currently in Pain?  No/denies       Aquatic therapy - pool temp 87.2 degrees  Patient seen for aquatic therapy today.  Treatment took place in water 3.5-4 feet deep depending upon activity.  Pt entered & exited  the pool via step negotiation with use of hand rails with SBA for safety.   Pt performed stretches for bil. Hip flexors using ankle cuff flotation weights for incr. Buoyancy for increased stretch; also used noodle under lower leg to increase buoyancy for increased stretch of hip flexors Pt performed hamstring/heel cord stretch (runner's stretch) each leg - 30 sec hold  Pt performed hip extension, flexion and abduction with cuff weight 10  reps using viscosity of water for resistance 10 reps each leg Marching in place 10 reps each - cues for big movement and for slow speed for improved SLS  Pt performed forward, backwards and sideways ambulation 35' x 2 reps each direction without UE support - cues for incr. Step length Pt performed marching forwards approx. 40' x 2 reps, then marching backwards 40' x 2 reps without UE support  Pt performed squats x 10 reps with UE support on pool wall  Ai Chi postures performed for trunk stabilization - rotation of trunk with UE movement, weight shift anteriorly/posteriorly with UE's up/back in opposite direction of leg - for improved balance and weight shift   Pt reclined in supine position with use of noodle under arms - pt performed hip abdct./adduction, single knee to chest, bicycling LE's and double knees to chest 10 reps each   Pt requires aquatic therapy for buoyancy of water for support and to be able to off load her body while minimizing fall risk; viscosity of water needed to provide resistance for strengthening trunk and LE musculature                              PT Short Term Goals - 10/30/17 1249  PT SHORT TERM GOAL #1   Title  The patient will return demo HEP for LE strengthening, stretching, and general balance/mobility. (STG date 11/01/2017)    Time  4    Period  Weeks    Status  Achieved      PT SHORT TERM GOAL #2   Title  Perform Berg balance test with LTG to follow.     Baseline  10/11: Berg balance score of 36/56 indicating high risk for falls     Time  4    Period  Weeks    Status  Achieved      PT SHORT TERM GOAL #3   Title  The patient will improve gait speed from 0.54 ft/sec to > or equal to 0.8 ft/sec to demo improving mobility.    Baseline  10/11: Pt demonstrates gait speed of 0.62 ft/sec using her rollator     Time  4    Period  Weeks    Status  Not Met      PT SHORT TERM GOAL #4   Title  Further assess/ trial AFOs in  clinic for bilateral knee control and foot drop.    Baseline  10/11: Pt reports usage of AFO's in the past with no success and is happy with her bilateral foot up braces.     Time  4    Period  Weeks    Status  Deferred        PT Long Term Goals - 12/02/17 1048      PT LONG TERM GOAL #1   Title  The patient will return demo progression of HEP for post d/c routine. (LTG due 12/01/2017)    Status  Achieved      PT LONG TERM GOAL #2   Title  The patient will improve gait speed from 0.54 ft/sec to > or equal to 1.2 ft/sec to demo improving household ambulation/ gait efficiency.    Baseline  11/4: Pt demonstrates gait speed of 0.68 ft/sec with her rollator indicative of household ambulation     Status  Not Met      PT LONG TERM GOAL #3   Title  The patient will ambulate >300 ft nonstop with rollator RW mod indep for limited community mobility (walking into/out of appointments).      Baseline  11/4: patient ambulates 332 feet with her rollator at mod I level     Status  Achieved      PT LONG TERM GOAL #4   Title  The patient will move sit<>stand 5/5 trial with UE support without requiring multiple attempts.     Baseline  11/4: patient performs 5 STS from standard height chair with UE's     Status  Achieved      PT LONG TERM GOAL #5   Title  The patient will be able to lift LEs into car without use of UE support to demo improving hip flexion strength.    Baseline  11/4: patient reports she continues to use her B UE's to lift LE's into car     Status  Not Met      PT LONG TERM GOAL #6   Title  patient to improve Berg to >/= 38/56 demonstrating reduced fall risk - goal met with score 39/56 on 11-23-17            Plan - 12/02/17 1050    Clinical Impression Statement  All LTG's except #2 and 5 have been achieved;  LTG #2  not met due to continued decreased gait speed due to LE weakness with foot drop; LTG #5 not met due to continued LE weakness with pt reporting need to use UE's to  assist in lifting LE's into car.  Pt has participated in 4 aquatic therapy sessions and is able to continue with aquatic exercise program upon D/C from PT.                                                                          Rehab Potential  Good    PT Frequency  2x / week    PT Duration  8 weeks    PT Next Visit Plan  N/A - D/C    PT Home Exercise Plan  pt will be given aquatic exercises to continue for HEP     Consulted and Agree with Plan of Care  Patient       Patient will benefit from skilled therapeutic intervention in order to improve the following deficits and impairments:  Abnormal gait, Impaired sensation, Impaired tone, Decreased activity tolerance, Decreased strength, Pain, Decreased balance, Decreased mobility, Difficulty walking, Increased muscle spasms, Decreased range of motion, Impaired flexibility  Visit Diagnosis: Other abnormalities of gait and mobility  Muscle weakness (generalized)     Problem List Patient Active Problem List   Diagnosis Date Noted  . Quadriplegia, unspecified ([place]) 12/24/2016  . Spastic paresis 09/11/2016  . Multiple sclerosis ([place]) 09/11/2016  . Muscle spasticity 10/31/2013  . Nerve root pain 01/31/2013  . Cervical myelopathy ([place]) 05/05/2011  . BP (high blood pressure) 05/05/2011  . Restless leg 05/05/2011  . Arthralgia, sacroiliac 05/05/2011    PHYSICAL THERAPY DISCHARGE SUMMARY  Visits from Start of Care: 18  Current functional level related to goals / functional outcomes: See above for progress towards goals - all goals met except LTG's #2 & 5   Remaining deficits: Continued decreased LE strength with foot drop; continued dependency with gait and continued standing balance deficits   Education / Equipment: Pt has been instructed in HEP for strengthening exercises and also instructed in aquatic exercise program to continue upon D/C from PT Plan: Patient agrees to discharge.  Patient goals were partially met. Patient is  being discharged due to meeting the stated rehab goals.  ?????         Alda Lea, PT 12/02/2017, 10:55 AM  [facility] [address] [place], [place], [address] Phone: [phone]   Fax:  [phone]  Name: ABIGAL CHOUNG MRN: [id number] Date of Birth: [birth year]

## 2017-12-07 ENCOUNTER — Ambulatory Visit (INDEPENDENT_AMBULATORY_CARE_PROVIDER_SITE_OTHER): Payer: Medicare Other | Admitting: Diagnostic Neuroimaging

## 2017-12-07 ENCOUNTER — Encounter: Payer: Self-pay | Admitting: Diagnostic Neuroimaging

## 2017-12-07 VITALS — BP 122/79 | HR 104 | Ht 61.0 in | Wt 114.0 lb

## 2017-12-07 DIAGNOSIS — G35 Multiple sclerosis: Secondary | ICD-10-CM | POA: Diagnosis not present

## 2017-12-07 NOTE — Progress Notes (Signed)
[facility]  PATIENT: Amy Bray DOB: 28-Oct-1945  REFERRING CLINICIAN:  HISTORY FROM: patient and husband  REASON FOR VISIT: follow up   HISTORICAL  CHIEF COMPLAINT:  Chief Complaint  Patient presents with  . Multiple Sclerosis    rm 7, husband- Clair Gulling, "swelling in feet, R>L; increased difficulty walking, just completed PT; Tysabri infusion is next week"  . Follow-up    HISTORY OF PRESENT ILLNESS:   UPDATE (12/07/17, VRP): Since last visit, now with swelling in feet x 2 months. Improved in last 1-2 weeks. No pain. No triggers. Had 1 day with some addl leg weakness (1 hour) then resolved on 11/29/17. Symptoms are now at baseline. Continues PT and water therapy. Tolerating tysabri.    UPDATE (08/10/17, VRP): Since last visit, doing well. Tolerating tysabri and generic amprya. No alleviating or aggravating factors. Hot flashes improved. Fatigue improved.   UPDATE (02/11/17, VRP): Since last visit, doing well. Tolerating meds. No alleviating or aggravating factors. Now on xeomin (Aug and Dec 2018) and spasticity improved. On generic ampyra and doing well.   UPDATE 08/11/16: Since last visit, doing well. Tolerating tysabri and baclofen. Heat has been challenging with spasms. Gait diff with knees rubbing together, slightly worse.  UPDATE 02/11/16: Since last visit, balance diff continues. No flares. MRIs stable. Tolerating tysabri.   UPDATE 10/08/15: Since last visit, pt feels some progression of gait diff and walking problems. No specific attack or flare up.  UPDATE 07/04/15: Since last visit, doing well. Tolerating tysabri and ampyra.  UPDATE 04/02/15: Since last visit, doing well. Tolerating tysabri and ampyra. Back on nortriptyline (is helping burning pain in feet).   UPDATE 01/03/15: Since last visit, doing better on ampyra (better gait and walking speed). More foot/calf tingling burning pain x 1-2 months. Now with Dr. Andree Elk pain mgmt for back injections x 2,  which is helping.  UPDATE 10/02/14: Since last visit, doing well. Requesting transition of care to [place] for pain mgmt; previously at [facility] with Dr. Oliva Bustard.  UPDATE 05/22/14: Since last visit, doing well. On tysabri since March 2016. Fatigue and bladder control much improved. Gait is stable.   UPDATE 02/20/14: Since last visit, had reviewed lab results and now ready to start MS medication. Now would like to start tysabri.  UPDATE 01/09/14: Since last visit, no new events symptoms. LP results reviewed. Didn't get lab work at last visit for MS mimics.   UPDATE 12/07/13: Patient is stable with symptoms. MRI scans reviewed. Continues to have weakness in legs and balance difficulty.  UPDATE 11/21/13: 72 year old right-handed female here for evaluation of lower extremity spasms and weakness. 2002 patient developed onset of right leg stiffness, spasm, weakness. Patient was diagnosed with cervical spine disease, bone spur ridging, and treated with anterior cervical discectomy and fusion in 2003. Following this her right leg weakness and spasms significantly improved. However one year later her symptoms return. Patient followed up with several surgeons and ultimately had a second cervical spine surgery in 2006. Following this patient had slight improvement of right leg however within several months her weakness, spasms and gait continued to decline. Patient transitioned needing a cane to walk. 2013 patient was evaluated by a neurologist in [place], to evaluate her right leg weakness. Apparently patient had a "multiple sclerosis" workup including MRI of the brain and extensive blood testing. Patient also had EMG nerve conduction studies looking for peroneal neuropathy but apparently all studies were normal. MRI of the brain did show a right medulla  lesion which was reported as a stroke. Over the past 1 year patient has developed new onset left leg weakness, spasm, stiffness. Patient followed up with orthopedic  surgery who then referred patient to me for evaluation. Last MRI of cervical spine was in 2006 at [facility]. No bowel or bladder incontinence. No significant numbness or tingling. No weakness in upper extremities. No vision loss, vision changes, slurred speech or trouble talking.   REVIEW OF SYSTEMS: Full 14 system review of systems performed and negative except: back pain cramps leg swelling.    ALLERGIES: No Known Allergies  HOME MEDICATIONS: Outpatient Medications Prior to Visit  Medication Sig Dispense Refill  . acetaminophen (TYLENOL) 500 MG tablet Take 500 mg by mouth as needed.    . baclofen (LIORESAL) 10 MG tablet Take 2 tablets (20 mg total) by mouth 3 (three) times daily. 180 tablet 12  . Cranberry-Vitamin C-Vitamin E (CRANBERRY PLUS VITAMIN C) 4200-20-3 MG-MG-UNIT CAPS Take by mouth.    . Dalfampridine (4-AMINOPYRIDINE) POWD 5 mg by Does not apply route 3 (three) times daily. 5mg  capsules, one tid    . denosumab (PROLIA) 60 MG/ML SOLN injection Inject 60 mg into the skin every 6 (six) months. Administer in upper arm, thigh, or abdomen    . incobotulinumtoxinA (XEOMIN) 100 units SOLR injection Inject 500 Units into the muscle every 3 (three) months.    Marland Kitchen lisinopril (PRINIVIL,ZESTRIL) 10 MG tablet Take 1 tablet by mouth daily.    Marland Kitchen MYRBETRIQ 50 MG TB24 tablet 50 mg.    . natalizumab (TYSABRI) 300 MG/15ML injection Inject into the vein.    Marland Kitchen nortriptyline (PAMELOR) 50 MG capsule Take 1 capsule by mouth daily.    . traMADol-acetaminophen (ULTRACET) 37.5-325 MG per tablet Take 1 tablet by mouth daily as needed.     Facility-Administered Medications Prior to Visit  Medication Dose Route Frequency Provider Last Rate Last Dose  . incobotulinumtoxinA (XEOMIN) 100 units injection 500 Units  500 Units Intramuscular Q90 days Marcial Pacas, MD   500 Units at 09/30/17 1317    PAST MEDICAL HISTORY: Past Medical History:  Diagnosis Date  . Cervical myelopathy ([place])   . Multiple sclerosis ([place])     . Osteoporosis 2018   hip    PAST SURGICAL HISTORY: Past Surgical History:  Procedure Laterality Date  . CERVICAL FUSION  01/2001, 01/2004   C3-C6    FAMILY HISTORY: Family History  Problem Relation Age of Onset  . Transient ischemic attack Mother   . Pancreatic cancer Father   . Breast cancer Cousin     SOCIAL HISTORY:  Social History   Socioeconomic History  . Marital status: Married    Spouse name: Darianny Momon  . Number of children: 0  . Years of education: College  . Highest education level: Not on file  Occupational History    Employer: OTHER    Comment: n/a  Social Needs  . Financial resource strain: Not on file  . Food insecurity:    Worry: Not on file    Inability: Not on file  . Transportation needs:    Medical: Not on file    Non-medical: Not on file  Tobacco Use  . Smoking status: Former Smoker    Packs/day: 1.00    Years: 15.00    Pack years: 15.00    Types: Cigarettes    Last attempt to quit: 01/21/1983    Years since quitting: 34.9  . Smokeless tobacco: Never Used  Substance and Sexual Activity  .  Alcohol use: Yes    Alcohol/week: 0.0 standard drinks    Comment: occasionally- wine  . Drug use: No  . Sexual activity: Not on file  Lifestyle  . Physical activity:    Days per week: Not on file    Minutes per session: Not on file  . Stress: Not on file  Relationships  . Social connections:    Talks on phone: Not on file    Gets together: Not on file    Attends religious service: Not on file    Active member of club or organization: Not on file    Attends meetings of clubs or organizations: Not on file    Relationship status: Not on file  . Intimate partner violence:    Fear of current or ex partner: Not on file    Emotionally abused: Not on file    Physically abused: Not on file    Forced sexual activity: Not on file  Other Topics Concern  . Not on file  Social History Narrative   Patient lives at home with her spouse.   Caffeine  Use-none     PHYSICAL EXAM  GENERAL EXAM/CONSTITUTIONAL: Vitals:  Vitals:   12/07/17 1444  BP: 122/79  Pulse: (!) 104  Weight: 114 lb (51.7 kg)  Height: 5\' 1"  (1.549 m)   Body mass index is 21.54 kg/m. Wt Readings from Last 3 Encounters:  12/07/17 114 lb (51.7 kg)  09/30/17 114 lb (51.7 kg)  08/10/17 111 lb 3.2 oz (50.4 kg)    Patient is in no distress; well developed, nourished and groomed; neck is supple  CARDIOVASCULAR:  Examination of carotid arteries is normal; no carotid bruits  Regular rate and rhythm, no murmurs  Examination of peripheral vascular system by observation and palpation is normal  EYES:  Ophthalmoscopic exam of optic discs and posterior segments is normal; no papilledema or hemorrhages No exam data present  MUSCULOSKELETAL:  Gait, strength, tone, movements noted in Neurologic exam below  NEUROLOGIC: MENTAL STATUS:  No flowsheet data found.  awake, alert, oriented to person, place and time  recent and remote memory intact  normal attention and concentration  language fluent, comprehension intact, naming intact,   fund of knowledge appropriate  CRANIAL NERVE:   2nd - no papilledema on fundoscopic exam  2nd, 3rd, 4th, 6th - pupils equal and reactive to light, visual fields full to confrontation, extraocular muscles intact, no nystagmus  5th - facial sensation symmetric  7th - facial strength symmetric  8th - hearing intact  9th - palate elevates symmetrically, uvula midline  11th - shoulder shrug symmetric  12th - tongue protrusion midline  MOTOR:   normal bulk and tone, full strength in the BUE; EXCEPT MILD INCREASED EXT TONE IN BLE; BLE (HF 3, KE 5 KF 4 DF 3)  SENSORY:   normal and symmetric to light touch, temperature; DECR AT TOES  COORDINATION:  finger-nose-finger, fine finger movements normal  REFLEXES:   deep tendon reflexes present and symmetric; EXCEPT BUE 3; BLE (HIP FLEXION 3; KNEES 3; ANKLES  2)  GAIT/STATION:   UNSTEADY, SPASTIC, PARAPARETIC GAIT; USES ROLLATOR WALKER   MENTAL STATUS: awake, alert, language fluent, comprehension intact, naming intact, fund of knowledge appropriate CRANIAL NERVE: pupils equal and reactive to light, visual fields full to confrontation, extraocular muscles intact, no nystagmus, facial sensation and strength symmetric, hearing intact, palate elevates symmetrically, uvula midline, shoulder shrug symmetric, tongue midline. MOTOR: normal bulk and tone in BUE;  SENSORY: DECR VIB AT  TOES  COORDINATION: finger-nose-finger, fine finger movements --> MINIMAL ACTION TREMOR IN BUE REFLEXES:  GAIT/STATION: SPASTIC, PARAPARETIC GAIT; USES ROLLATOR WALKER      DIAGNOSTIC DATA (LABS, IMAGING, TESTING) - I reviewed patient records, labs, notes, testing and imaging myself where available.  Lab Results  Component Value Date   WBC 10.4 08/10/2017   HGB 11.0 (L) 08/10/2017   HCT 34.0 08/10/2017   MCV 95 08/10/2017   PLT 284 08/10/2017      Component Value Date/Time   NA 145 (H) 08/10/2017 1455   K 3.9 08/10/2017 1455   CL 103 08/10/2017 1455   CO2 25 08/10/2017 1455   GLUCOSE 71 08/10/2017 1455   BUN 15 08/10/2017 1455   CREATININE 0.63 08/10/2017 1455   CALCIUM 9.0 08/10/2017 1455   PROT 5.9 (L) 08/10/2017 1455   ALBUMIN 4.1 08/10/2017 1455   AST 11 08/10/2017 1455   ALT 12 08/10/2017 1455   ALKPHOS 45 08/10/2017 1455   BILITOT 0.2 08/10/2017 1455   GFRNONAA 91 08/10/2017 1455   GFRAA 105 08/10/2017 1455   No results found for: CHOL, HDL, LDLCALC, LDLDIRECT, TRIG, CHOLHDL No results found for: HGBA1C No results found for: VITAMINB12 No results found for: TSH  Vit D, 25-Hydroxy  Date Value Ref Range Status  01/03/2015 36.8 30.0 - 100.0 ng/mL Final    Comment:    Vitamin D deficiency has been defined by the Institute of Medicine and an Endocrine Society practice guideline as a level of serum 25-OH vitamin D less than 20 ng/mL  (1,2). The Endocrine Society went on to further define vitamin D insufficiency as a level between 21 and 29 ng/mL (2). 1. IOM (Institute of Medicine). 2010. Dietary reference    intakes for calcium and D. [place]: The    [employer]. 2. Holick MF, Binkley [place], Bischoff-Ferrari HA, et al.    Evaluation, treatment, and prevention of vitamin D    deficiency: an Endocrine Society clinical practice    guideline. JCEM. 2011 Jul; 96(7):1911-30.     I reviewed images myself. In my review, I think there is a 2cm spinal cord lesion (right side, C6-C7) and a smaller left T1 lesion, suspicious for demyelinating disease, on the MRI cervical spine from 10/08/03. No adjacent disc or bone degeneration or spinal stenosis. The MRI report states that the cord appears normal. -VRP  10/08/03 MRI cervical spine (images reviewed) - Satisfactory anterior plate fusion of C4 and C5.Disk degeneration and spondylosis at C3-4 and C5-6 with biforaminal narrowing but no significant central canal stenosis. There is no disk protrusion.   12/29/13 LP - WBC 1, RBC 0, protein 26, glucose 59, OCB > 5, gram stain neg  04/26/17 MRI of the brain with and without contrast [I reviewed images myself and agree with interpretation. 1 small new lesion. -VRP]  1.   Small left occipital subcortical T2/FLAIR hyperintense focus with subtle hyperintensity on diffusion-weighted images.  This focus was not present on the 08/24/2016 MRI.  The etiology is uncertain but it could represent a sub-chronic focus of chronic ischemic or demyelinating change.   2.   There are a couple T2/FLAIR hyperintense foci in the subcortical and deep white matter.  This is a nonspecific finding and could be incidental at this age, will be due to minimal chronic microvascular ischemic change or demyelination.  None of the foci appears to be acute and there is no change when compared to the 08/24/2016 MRI. 3.   Mild generalized cortical  atrophy, typical for  age. 4.   There is a normal enhancement pattern.  04/26/17 MRI of the cervical spine with and without contrast [I reviewed images myself and agree with interpretation. Stable since 2017.  -VRP]  1.   There are 3 small T2/FLAIR hyperintense foci within the spinal cord adjacent to C6, C7 and T2.  All of these were present on the 10/19/2015 MRI.  None of these enhance. 2.   Multilevel degenerative changes as detailed above.   The degenerative changes at C6-C7 have slightly progressed when compared to the previous MRI.  There did not appear to be any nerve root compression. 3.   ACDF from C3 through C6.   4.   There is a normal enhancement pattern of there are no acute findings.  04/26/17 Unremarkable MRI thoracic spine (with and without). [I reviewed images myself and agree with interpretation. -VRP]    Anti-JCV antibody 01/10/14 - 0.10 negative  05/22/14 - 0.09 negative 12/2014 - negative March 2017 - negative 10/09/15 - 0.13 negative 02/11/16 JCV antibody 0.11 negative 08-13-16 JCV antibody 0.11 negative sy 03/05/17 JCV negative 0.07 08-15-17 JCV negative, index 0.08    ASSESSMENT AND PLAN  72 y.o. year old female here with  bilateral lower extremity spasticity, weakness,with brisk reflexes in upper and lower extremities, left Hoffman sign, bilateral upgoing toes. MRIs from 2005 and 2015 demonstrate multiple spinal cord lesions. Now suspect demyelinating disease, with first attack in 2002, second attack in 2003, and 3rd attack in 2014.  Then with some progression of symptoms since mid-2017.  Some worsening balance since Jan 2019.    Dx: multiple sclerosis   No diagnosis found.    PLAN:  MULTIPLE SCLEROSIS (RRMS; ? Secondary progression by symptoms) - continue tysabri; may consider switch to ocrevus - continue dalfampridine; walking speed and balance are better with medcation (T25W 46 --> 25s) - continue PT - check labs - repeat MRI in May 2020  MUSCLE SPASMS (stable) - continue  baclofen - continue chemodenervation (per Dr. [name])  Orders Placed This Encounter  Procedures  . CBC with Differential/Platelet  . Comprehensive metabolic panel  . Stratify JCV Ab (w/ Index) w/ Rflx   Return in about 6 months (around 06/07/2018).     Penni Bombard, MD 18/59/0931, 1:21 PM Certified in Neurology, Neurophysiology and Neuroimaging  [facility] [address] [place], [place] [address] [phone]

## 2017-12-07 NOTE — Patient Instructions (Addendum)
-   check labs today  - repeat MRI in April / May 2020  - use compression stockings

## 2017-12-07 NOTE — Progress Notes (Signed)
JCV lab sample in lock box for [facility] to pick up.

## 2017-12-08 LAB — CBC WITH DIFFERENTIAL/PLATELET
Basophils Absolute: 0.1 10*3/uL (ref 0.0–0.2)
Basos: 1 %
EOS (ABSOLUTE): 0.2 10*3/uL (ref 0.0–0.4)
Eos: 3 %
Hematocrit: 37.9 % (ref 34.0–46.6)
Hemoglobin: 12.4 g/dL (ref 11.1–15.9)
Immature Grans (Abs): 0 10*3/uL (ref 0.0–0.1)
Immature Granulocytes: 1 %
Lymphocytes Absolute: 1.8 10*3/uL (ref 0.7–3.1)
Lymphs: 30 %
MCH: 30.8 pg (ref 26.6–33.0)
MCHC: 32.7 g/dL (ref 31.5–35.7)
MCV: 94 fL (ref 79–97)
Monocytes Absolute: 0.6 10*3/uL (ref 0.1–0.9)
Monocytes: 10 %
NRBC: 1 % — ABNORMAL HIGH (ref 0–0)
Neutrophils Absolute: 3.4 10*3/uL (ref 1.4–7.0)
Neutrophils: 55 %
Platelets: 323 10*3/uL (ref 150–450)
RBC: 4.02 x10E6/uL (ref 3.77–5.28)
RDW: 12.6 % (ref 12.3–15.4)
WBC: 6 10*3/uL (ref 3.4–10.8)

## 2017-12-08 LAB — COMPREHENSIVE METABOLIC PANEL
ALT: 14 IU/L (ref 0–32)
AST: 15 IU/L (ref 0–40)
Albumin/Globulin Ratio: 3 — ABNORMAL HIGH (ref 1.2–2.2)
Albumin: 4.5 g/dL (ref 3.5–4.8)
Alkaline Phosphatase: 45 IU/L (ref 39–117)
BUN/Creatinine Ratio: 18 (ref 12–28)
BUN: 13 mg/dL (ref 8–27)
Bilirubin Total: <0.2 mg/dL (ref 0.0–1.2)
CO2: 28 mmol/L (ref 20–29)
Calcium: 10 mg/dL (ref 8.7–10.3)
Chloride: 101 mmol/L (ref 96–106)
Creatinine, Ser: 0.73 mg/dL (ref 0.57–1.00)
GFR calc Af Amer: 96 mL/min/{1.73_m2} (ref >59)
GFR calc non Af Amer: 83 mL/min/{1.73_m2} (ref >59)
Globulin, Total: 1.5 g/dL (ref 1.5–4.5)
Glucose: 84 mg/dL (ref 65–99)
Potassium: 4.4 mmol/L (ref 3.5–5.2)
Sodium: 142 mmol/L (ref 134–144)
Total Protein: 6 g/dL (ref 6.0–8.5)

## 2017-12-09 ENCOUNTER — Encounter: Payer: Self-pay | Admitting: *Deleted

## 2017-12-14 LAB — RFLX STRATIFY JCV (TM) AB INHIBITION

## 2017-12-14 LAB — STRATIFY JCV AB (W/ INDEX) W/ RFLX
Index Value: 0.12
Stratify JCV (TM) Ab w/Reflex Inhibition: NEGATIVE

## 2017-12-14 NOTE — Progress Notes (Signed)
12/12/2017 JCV negative, index 0.12. sy

## 2017-12-16 DIAGNOSIS — G35 Multiple sclerosis: Secondary | ICD-10-CM | POA: Diagnosis not present

## 2017-12-31 ENCOUNTER — Encounter: Payer: Self-pay | Admitting: Neurology

## 2017-12-31 ENCOUNTER — Ambulatory Visit (INDEPENDENT_AMBULATORY_CARE_PROVIDER_SITE_OTHER): Payer: Medicare Other | Admitting: Neurology

## 2017-12-31 VITALS — BP 99/66 | HR 104 | Ht 61.0 in | Wt 112.0 lb

## 2017-12-31 DIAGNOSIS — G35 Multiple sclerosis: Secondary | ICD-10-CM

## 2017-12-31 DIAGNOSIS — G8250 Quadriplegia, unspecified: Secondary | ICD-10-CM

## 2017-12-31 MED ORDER — INCOBOTULINUMTOXINA 100 UNITS IM SOLR
500.0000 [IU] | INTRAMUSCULAR | Status: DC
  Administered 2017-12-31: 500 [IU] via INTRAMUSCULAR

## 2017-12-31 NOTE — Progress Notes (Signed)
**  Xeomin 100 units x 5 vials, NDC 0259-1610-01, Lot [id number], Exp 04/2020, office supply.//mck,rn**

## 2017-12-31 NOTE — Progress Notes (Addendum)
PATIENT: Amy Bray DOB: Jun 01, 1945    HISTORICAL  NARGIS ABRAMS is a 72 years old right-handed female , seen in refer by my colleague Dr. Leta Baptist for evaluation of EMG guided botulism toxin injection for bilateral lower extremity spasticity, gait abnormality, initial evaluation was on September 11 2016.   This is the first time I have seen this patient, I also performed electrical stimulation guided xeomin injection for spastic paraplegia, I used 400 units of xeomin on September 11 2016   She presented with right leg stiffness, spasm, weakness in 2002, was diagnosed with cervical spine disease, had anterior cervical vasectomy and fusion in 2003. Following the surgery, her right leg weakness and spasms significantly improved, however a year later the symptoms returned, she ultimately had second cervical spine surgery in 2006, following second surgery, she only has mild improvement of her symptoms,  Over the years, she continue have progressive worsening spastic bilateral lower extremity, gait abnormality, eventually leading to this diagnosis of relapsing remitting multiple sclerosis,  She was treated with Dwyane Dee over the past few years, her symptoms has been steady.  We have personally reviewed most recent MRI cervical spine in September 2017, 3 small T2 hyper intensity foci within the spinal cord, adjacent to C5-6 to the left, adjacent to C6-7 to the right, and adjacent to T2, no change compared to previous scan in 2015, evidence of surgical fusion from C3-C6.  MRI of brain showed few scattered T2/FLAIR hyperintensity foci in the subcortical deep matter of the frontal area,  Since Tysarbri infusion, her symptoms overall has been steady, but over the years, she noticed increased bilateral lower extremity muscle spasticity, especially at nighttime, she described her left joint worsens stomach, jerking movement, while ambulating, she noticed bilateral knees rubbing towards each other,  bilateral leg tends to "worse each other. She rely on her walker now.  Previously she had EMG guided Botox injection at [facility] for bilateral lower extremity spasticity  with very good result  UPDATE Dec 5th 2018: Responded very well to previous injection September 11, 2016, there was no significant side effect noticed, we used 400 units of xeomin, today she wants to emphasize on left lower extremity spasm, hip flexion,  UPDATE March 23 2017: She responded very well to previous xeomin injection 400 units to bilateral lower extremity, we used 600 units total, with emphasized on bilateral hip adduction, flexion  UPDATE June 24 2017: We used 500 units xeomin today, she denies significant side effect with 600 units last time, insurance maximum 500 units  UPDATE Sept 11 2019: She responded well to previous injection but the benefit only last about 3 months, at the end of the 3 months, she developed increased spasticity of bilateral leg, adduction of bilateral leg  REVIEW OF SYSTEMS: Full 14 system review of systems performed and notable only for gait abnormality, back pain   ALLERGIES: No Known Allergies  HOME MEDICATIONS: Current Outpatient Medications  Medication Sig Dispense Refill  . acetaminophen (TYLENOL) 500 MG tablet Take 500 mg by mouth as needed.    . baclofen (LIORESAL) 10 MG tablet Take 2 tablets (20 mg total) by mouth 3 (three) times daily. 180 tablet 12  . Cranberry-Vitamin C-Vitamin E (CRANBERRY PLUS VITAMIN C) 4200-20-3 MG-MG-UNIT CAPS Take by mouth.    . Dalfampridine (4-AMINOPYRIDINE) POWD 5 mg by Does not apply route 3 (three) times daily. 5mg  capsules, one tid    . denosumab (PROLIA) 60 MG/ML SOLN injection Inject 60 mg into the  skin every 6 (six) months. Administer in upper arm, thigh, or abdomen    . incobotulinumtoxinA (XEOMIN) 100 units SOLR injection Inject 500 Units into the muscle every 3 (three) months.    Marland Kitchen lisinopril (PRINIVIL,ZESTRIL) 10 MG tablet Take 1 tablet by  mouth daily.    Marland Kitchen MYRBETRIQ 50 MG TB24 tablet 50 mg.    . natalizumab (TYSABRI) 300 MG/15ML injection Inject into the vein.    Marland Kitchen nortriptyline (PAMELOR) 50 MG capsule Take 1 capsule by mouth daily.    . traMADol-acetaminophen (ULTRACET) 37.5-325 MG per tablet Take 1 tablet by mouth daily as needed.     No current facility-administered medications for this visit.     PAST MEDICAL HISTORY: Past Medical History:  Diagnosis Date  . Cervical myelopathy ([employer])   . Multiple sclerosis ([place])   . Osteoporosis 2018   hip    PAST SURGICAL HISTORY: Past Surgical History:  Procedure Laterality Date  . CERVICAL FUSION  01/2001, 01/2004   C3-C6    FAMILY HISTORY: Family History  Problem Relation Age of Onset  . Transient ischemic attack Mother   . Pancreatic cancer Father   . Breast cancer Cousin     SOCIAL HISTORY:  Social History   Socioeconomic History  . Marital status: Married    Spouse name: Mallika Sanmiguel  . Number of children: 0  . Years of education: College  . Highest education level: Not on file  Occupational History    Employer: OTHER    Comment: n/a  Social Needs  . Financial resource strain: Not on file  . Food insecurity:    Worry: Not on file    Inability: Not on file  . Transportation needs:    Medical: Not on file    Non-medical: Not on file  Tobacco Use  . Smoking status: Former Smoker    Packs/day: 1.00    Years: 15.00    Pack years: 15.00    Types: Cigarettes    Last attempt to quit: 01/21/1983    Years since quitting: 34.9  . Smokeless tobacco: Never Used  Substance and Sexual Activity  . Alcohol use: Yes    Alcohol/week: 0.0 standard drinks    Comment: occasionally- wine  . Drug use: No  . Sexual activity: Not on file  Lifestyle  . Physical activity:    Days per week: Not on file    Minutes per session: Not on file  . Stress: Not on file  Relationships  . Social connections:    Talks on phone: Not on file    Gets together: Not on file     Attends religious service: Not on file    Active member of club or organization: Not on file    Attends meetings of clubs or organizations: Not on file    Relationship status: Not on file  . Intimate partner violence:    Fear of current or ex partner: Not on file    Emotionally abused: Not on file    Physically abused: Not on file    Forced sexual activity: Not on file  Other Topics Concern  . Not on file  Social History Narrative   Patient lives at home with her spouse.   Caffeine Use-none     PHYSICAL EXAM   Vitals:   12/31/17 1358  BP: 99/66  Pulse: (!) 104  Weight: 112 lb (50.8 kg)  Height: 5\' 1"  (1.549 m)    Not recorded      Body mass  index is 21.16 kg/m.  PHYSICAL EXAMNIATION:  Gen: NAD, conversant, well nourised, obese, well groomed                     Cardiovascular: Regular rate rhythm, no peripheral edema, warm, nontender. Eyes: Conjunctivae clear without exudates or hemorrhage Neck: Supple, no carotid bruits. Pulmonary: Clear to auscultation bilaterally   NEUROLOGICAL EXAM:  MENTAL STATUS: Speech:    Speech is normal; fluent and spontaneous with normal comprehension.  Cognition:     Orientation to time, place and person     Normal recent and remote memory     Normal Attention span and concentration     Normal Language, naming, repeating,spontaneous speech     Fund of knowledge   CRANIAL NERVES: CN II: Visual fields are full to confrontation. Fundoscopic exam is normal with sharp discs and no vascular changes. Pupils are round equal and briskly reactive to light. CN III, IV, VI: extraocular movement are normal. No ptosis. CN V: Facial sensation is intact to pinprick in all 3 divisions bilaterally. Corneal responses are intact.  CN VII: Face is symmetric with normal eye closure and smile. CN VIII: Hearing is normal to rubbing fingers CN IX, X: Palate elevates symmetrically. Phonation is normal. CN XI: Head turning and shoulder shrug are intact CN  XII: Tongue is midline with normal movements and no atrophy.  MOTOR: She has moderate spasticity of bilateral lower extremity, mild to moderate proximal lower extremity weakness,   REFLEXES: Reflexes are 2+ and symmetric at the biceps, triceps, knees, and ankles. Plantar responses Extensor bilaterally  Sensory: Intact to light touch  COORDINATION: Rapid alternating movements and fine finger movements are intact. There is no dysmetria on finger-to-nose and heel-knee-shin.    GAIT/STANCE: She rely on her walker to get up from seated position, significant bilateral adductor muscle spasm, rubbing her knees together while walking, spastic, unsteady slow cautious gait, bilateral flat feet, difficulty to clear bilateral feet from the floor DIAGNOSTIC DATA (LABS, IMAGING, TESTING)  - I reviewed patient records, labs, notes, testing and imaging myself where available.   ASSESSMENT AND PLAN  OMIE FERGER is a 72 y.o. female   Spastic paraparesis.  Relapsing remitting multiple sclerosis with cervical cord involvement, history of spondylitic cervical myelopathy   Under electric stimulation we have performed xeomin injection today use 500 units  Right adductor longus 100 Right adductor magnus 50 units Right tibialis posterior 25 units Right flexor digitorum longus 25 units  Left adductor longus 100 units Left adductor magnus 100 Left tibialis posterior 50 units Left flexor digitorum longus 50 units  Patient tolerate the injection well, returned to 3 months for repeat injection   Marcial Pacas, M.D. Ph.D.  [facility] [address], [place], [place] [address] Ph: [phone] Fax: [phone]  CC: Hulan Fess, MD

## 2018-01-04 DIAGNOSIS — M533 Sacrococcygeal disorders, not elsewhere classified: Secondary | ICD-10-CM | POA: Diagnosis not present

## 2018-01-04 DIAGNOSIS — M5137 Other intervertebral disc degeneration, lumbosacral region: Secondary | ICD-10-CM | POA: Diagnosis not present

## 2018-01-04 DIAGNOSIS — Z79899 Other long term (current) drug therapy: Secondary | ICD-10-CM | POA: Diagnosis not present

## 2018-01-04 DIAGNOSIS — M47817 Spondylosis without myelopathy or radiculopathy, lumbosacral region: Secondary | ICD-10-CM | POA: Diagnosis not present

## 2018-01-04 DIAGNOSIS — Z79891 Long term (current) use of opiate analgesic: Secondary | ICD-10-CM | POA: Diagnosis not present

## 2018-01-04 DIAGNOSIS — G894 Chronic pain syndrome: Secondary | ICD-10-CM | POA: Diagnosis not present

## 2018-01-14 DIAGNOSIS — G35 Multiple sclerosis: Secondary | ICD-10-CM | POA: Diagnosis not present

## 2018-02-15 ENCOUNTER — Ambulatory Visit: Payer: Medicare Other | Admitting: Diagnostic Neuroimaging

## 2018-03-18 ENCOUNTER — Telehealth: Payer: Self-pay | Admitting: *Deleted

## 2018-03-18 NOTE — Telephone Encounter (Signed)
Received MS Authorization Form. Completed and to Dr. Leta Baptist for review and signature.

## 2018-03-19 NOTE — Telephone Encounter (Signed)
Received notice of patient aurthorization form [employer].  From 03-19-18 thru 10-12-18.  Given copy to [facility].

## 2018-03-31 ENCOUNTER — Other Ambulatory Visit: Payer: Self-pay

## 2018-03-31 ENCOUNTER — Ambulatory Visit (INDEPENDENT_AMBULATORY_CARE_PROVIDER_SITE_OTHER): Payer: Medicare Other | Admitting: Neurology

## 2018-03-31 ENCOUNTER — Encounter: Payer: Self-pay | Admitting: Neurology

## 2018-03-31 VITALS — BP 109/70 | HR 96 | Ht 61.0 in | Wt 109.0 lb

## 2018-03-31 DIAGNOSIS — G8250 Quadriplegia, unspecified: Secondary | ICD-10-CM | POA: Diagnosis not present

## 2018-03-31 DIAGNOSIS — G35 Multiple sclerosis: Secondary | ICD-10-CM | POA: Diagnosis not present

## 2018-03-31 MED ORDER — INCOBOTULINUMTOXINA 100 UNITS IM SOLR
500.0000 [IU] | INTRAMUSCULAR | Status: DC
  Administered 2018-03-31: 500 [IU] via INTRAMUSCULAR

## 2018-03-31 NOTE — Progress Notes (Signed)
PATIENT: Amy Bray DOB: Jan 18, 1946    HISTORICAL  Amy Bray is a 73 years old right-handed female , seen in refer by my colleague Dr. Leta Baptist for evaluation of EMG guided botulism toxin injection for bilateral lower extremity spasticity, gait abnormality, initial evaluation was on September 11 2016.   This is the first time I have seen this patient, I also performed electrical stimulation guided xeomin injection for spastic paraplegia, I used 400 units of xeomin on September 11 2016   She presented with right leg stiffness, spasm, weakness in 2002, was diagnosed with cervical spine disease, had anterior cervical vasectomy and fusion in 2003. Following the surgery, her right leg weakness and spasms significantly improved, however a year later the symptoms returned, she ultimately had second cervical spine surgery in 2006, following second surgery, she only has mild improvement of her symptoms,  Over the years, she continue have progressive worsening spastic bilateral lower extremity, gait abnormality, eventually leading to this diagnosis of relapsing remitting multiple sclerosis,  She was treated with Dwyane Dee over the past few years, her symptoms has been steady.  We have personally reviewed most recent MRI cervical spine in September 2017, 3 small T2 hyper intensity foci within the spinal cord, adjacent to C5-6 to the left, adjacent to C6-7 to the right, and adjacent to T2, no change compared to previous scan in 2015, evidence of surgical fusion from C3-C6.  MRI of brain showed few scattered T2/FLAIR hyperintensity foci in the subcortical deep matter of the frontal area,  Since Tysarbri infusion, her symptoms overall has been steady, but over the years, she noticed increased bilateral lower extremity muscle spasticity, especially at nighttime, she described her left joint worsens stomach, jerking movement, while ambulating, she noticed bilateral knees rubbing towards each other,  bilateral leg tends to "worse each other. She rely on her walker now.  Previously she had EMG guided Botox injection at [facility] for bilateral lower extremity spasticity  with very good result  UPDATE Dec 5th 2018: Responded very well to previous injection September 11, 2016, there was no significant side effect noticed, we used 400 units of xeomin, today she wants to emphasize on left lower extremity spasm, hip flexion,  UPDATE March 23 2017: She responded very well to previous xeomin injection 400 units to bilateral lower extremity, we used 600 units total, with emphasized on bilateral hip adduction, flexion  UPDATE June 24 2017: We used 500 units xeomin today, she denies significant side effect with 600 units last time, insurance maximum 500 units  UPDATE Sept 11 2019: She responded well to previous injection but the benefit only last about 3 months, at the end of the 3 months, she developed increased spasticity of bilateral leg, adduction of bilateral leg  REVIEW OF SYSTEMS: Full 14 system review of systems performed and notable only for gait abnormality, back pain   ALLERGIES: No Known Allergies  HOME MEDICATIONS: Current Outpatient Medications  Medication Sig Dispense Refill   acetaminophen (TYLENOL) 500 MG tablet Take 500 mg by mouth as needed.     baclofen (LIORESAL) 10 MG tablet Take 2 tablets (20 mg total) by mouth 3 (three) times daily. 180 tablet 12   Cranberry-Vitamin C-Vitamin E (CRANBERRY PLUS VITAMIN C) 4200-20-3 MG-MG-UNIT CAPS Take by mouth.     Dalfampridine (4-AMINOPYRIDINE) POWD 5 mg by Does not apply route 3 (three) times daily. 5mg  capsules, one tid     denosumab (PROLIA) 60 MG/ML SOLN injection Inject 60 mg into the  skin every 6 (six) months. Administer in upper arm, thigh, or abdomen     incobotulinumtoxinA (XEOMIN) 100 units SOLR injection Inject 500 Units into the muscle every 3 (three) months.     lisinopril (PRINIVIL,ZESTRIL) 10 MG tablet Take 1 tablet by  mouth daily.     MYRBETRIQ 50 MG TB24 tablet 50 mg.     natalizumab (TYSABRI) 300 MG/15ML injection Inject into the vein.     nortriptyline (PAMELOR) 50 MG capsule Take 1 capsule by mouth daily.     traMADol-acetaminophen (ULTRACET) 37.5-325 MG per tablet Take 1 tablet by mouth daily as needed.     No current facility-administered medications for this visit.     PAST MEDICAL HISTORY: Past Medical History:  Diagnosis Date   Cervical myelopathy ([place])    Multiple sclerosis ([place])    Osteoporosis 2018   hip    PAST SURGICAL HISTORY: Past Surgical History:  Procedure Laterality Date   CERVICAL FUSION  01/2001, 01/2004   C3-C6    FAMILY HISTORY: Family History  Problem Relation Age of Onset   Transient ischemic attack Mother    Pancreatic cancer Father    Breast cancer Cousin     SOCIAL HISTORY:  Social History   Socioeconomic History   Marital status: Married    Spouse name: Amy Bray   Number of children: 0   Years of education: College   Highest education level: Not on file  Occupational History    Employer: OTHER    Comment: n/a  Scientist, product/process development strain: Not on file   Food insecurity:    Worry: Not on file    Inability: Not on file   Transportation needs:    Medical: Not on file    Non-medical: Not on file  Tobacco Use   Smoking status: Former Smoker    Packs/day: 1.00    Years: 15.00    Pack years: 15.00    Types: Cigarettes    Last attempt to quit: 01/21/1983    Years since quitting: 35.2   Smokeless tobacco: Never Used  Substance and Sexual Activity   Alcohol use: Yes    Alcohol/week: 0.0 standard drinks    Comment: occasionally- wine   Drug use: No   Sexual activity: Not on file  Lifestyle   Physical activity:    Days per week: Not on file    Minutes per session: Not on file   Stress: Not on file  Relationships   Social connections:    Talks on phone: Not on file    Gets together: Not on file     Attends religious service: Not on file    Active member of club or organization: Not on file    Attends meetings of clubs or organizations: Not on file    Relationship status: Not on file   Intimate partner violence:    Fear of current or ex partner: Not on file    Emotionally abused: Not on file    Physically abused: Not on file    Forced sexual activity: Not on file  Other Topics Concern   Not on file  Social History Narrative   Patient lives at home with her spouse.   Caffeine Use-none     PHYSICAL EXAM   Vitals:   03/31/18 1349  BP: 109/70  Pulse: 96  Weight: 109 lb (49.4 kg)  Height: 5\' 1"  (1.549 m)    Not recorded      Body mass index  is 20.6 kg/m.  PHYSICAL EXAMNIATION:  Gen: NAD, conversant, well nourised, obese, well groomed                     Cardiovascular: Regular rate rhythm, no peripheral edema, warm, nontender. Eyes: Conjunctivae clear without exudates or hemorrhage Neck: Supple, no carotid bruits. Pulmonary: Clear to auscultation bilaterally   NEUROLOGICAL EXAM:  MENTAL STATUS: Speech:    Speech is normal; fluent and spontaneous with normal comprehension.  Cognition:     Orientation to time, place and person     Normal recent and remote memory     Normal Attention span and concentration     Normal Language, naming, repeating,spontaneous speech     Fund of knowledge   CRANIAL NERVES: CN II: Visual fields are full to confrontation. Fundoscopic exam is normal with sharp discs and no vascular changes. Pupils are round equal and briskly reactive to light. CN III, IV, VI: extraocular movement are normal. No ptosis. CN V: Facial sensation is intact to pinprick in all 3 divisions bilaterally. Corneal responses are intact.  CN VII: Face is symmetric with normal eye closure and smile. CN VIII: Hearing is normal to rubbing fingers CN IX, X: Palate elevates symmetrically. Phonation is normal. CN XI: Head turning and shoulder shrug are intact CN XII:  Tongue is midline with normal movements and no atrophy.  MOTOR: She has moderate spasticity of bilateral lower extremity, mild to moderate proximal lower extremity weakness,   REFLEXES: Reflexes are 2+ and symmetric at the biceps, triceps, knees, and ankles. Plantar responses Extensor bilaterally  Sensory: Intact to light touch  COORDINATION: Rapid alternating movements and fine finger movements are intact. There is no dysmetria on finger-to-nose and heel-knee-shin.    GAIT/STANCE: She rely on her walker to get up from seated position, significant bilateral adductor muscle spasm, rubbing her knees together while walking, spastic, unsteady slow cautious gait, bilateral flat feet, difficulty to clear bilateral feet from the floor DIAGNOSTIC DATA (LABS, IMAGING, TESTING)  - I reviewed patient records, labs, notes, testing and imaging myself where available.   ASSESSMENT AND PLAN  Amy Bray is a 73 y.o. female   Spastic paraparesis.  Relapsing remitting multiple sclerosis with cervical cord involvement, history of spondylitic cervical myelopathy   Under electric stimulation we have performed xeomin injection today use 500 units  Right adductor longus 100 Right adductor magnus 50 units Right tibialis posterior 25 units Right flexor digitorum longus 25 units  Left adductor longus 100 units Left adductor magnus 100 Left tibialis posterior 50 units Left flexor digitorum longus 50 units  Patient tolerate the injection well, returned to 3 months for repeat injection   Marcial Pacas, M.D. Ph.D.  [facility] [address], [place], [place] [address] Ph: [phone] Fax: [phone]  CC: Hulan Fess, MD

## 2018-03-31 NOTE — Addendum Note (Signed)
Addended by: Noberto Retort C on: 03/31/2018 03:05 PM   Modules accepted: Orders

## 2018-03-31 NOTE — Progress Notes (Signed)
PATIENT: Amy Bray DOB: Apr 15, 1945    HISTORICAL  HASET OAXACA is a 73 years old right-handed female , seen in refer by my colleague Dr. Leta Baptist for evaluation of EMG guided botulism toxin injection for bilateral lower extremity spasticity, gait abnormality, initial evaluation was on September 11 2016.   This is the first time I have seen this patient, I also performed electrical stimulation guided xeomin injection for spastic paraplegia, I used 400 units of xeomin on September 11 2016   She presented with right leg stiffness, spasm, weakness in 2002, was diagnosed with cervical spine disease, had anterior cervical vasectomy and fusion in 2003. Following the surgery, her right leg weakness and spasms significantly improved, however a year later the symptoms returned, she ultimately had second cervical spine surgery in 2006, following second surgery, she only has mild improvement of her symptoms,  Over the years, she continue have progressive worsening spastic bilateral lower extremity, gait abnormality, eventually leading to this diagnosis of relapsing remitting multiple sclerosis,  She was treated with Dwyane Dee over the past few years, her symptoms has been steady.  We have personally reviewed most recent MRI cervical spine in September 2017, 3 small T2 hyper intensity foci within the spinal cord, adjacent to C5-6 to the left, adjacent to C6-7 to the right, and adjacent to T2, no change compared to previous scan in 2015, evidence of surgical fusion from C3-C6.  MRI of brain showed few scattered T2/FLAIR hyperintensity foci in the subcortical deep matter of the frontal area,  Since Tysarbri infusion, her symptoms overall has been steady, but over the years, she noticed increased bilateral lower extremity muscle spasticity, especially at nighttime, she described her left joint worsens stomach, jerking movement, while ambulating, she noticed bilateral knees rubbing towards each other,  bilateral leg tends to "worse each other. She rely on her walker now.  Previously she had EMG guided Botox injection at [facility] for bilateral lower extremity spasticity  with very good result  UPDATE Dec 5th 2018: Responded very well to previous injection September 11, 2016, there was no significant side effect noticed, we used 400 units of xeomin, today she wants to emphasize on left lower extremity spasm, hip flexion,  UPDATE March 23 2017: She responded very well to previous xeomin injection 400 units to bilateral lower extremity, we used 600 units total, with emphasized on bilateral hip adduction, flexion  UPDATE June 24 2017: We used 500 units xeomin today, she denies significant side effect with 600 units last time, insurance maximum 500 units  UPDATE Sept 11 2019: She responded well to previous injection but the benefit only last about 3 months, at the end of the 3 months, she developed increased spasticity of bilateral leg, adduction of bilateral leg  UPDATE March 31 2018: She responded very well to previous injection, we used xeomin 500 units, she noticed less thigh adduction and toes flexion    REVIEW OF SYSTEMS: Full 14 system review of systems performed and notable only for gait abnormality, back pain   ALLERGIES: No Known Allergies  HOME MEDICATIONS: Current Outpatient Medications  Medication Sig Dispense Refill  . acetaminophen (TYLENOL) 500 MG tablet Take 500 mg by mouth as needed.    . baclofen (LIORESAL) 10 MG tablet Take 2 tablets (20 mg total) by mouth 3 (three) times daily. 180 tablet 12  . Cranberry-Vitamin C-Vitamin E (CRANBERRY PLUS VITAMIN C) 4200-20-3 MG-MG-UNIT CAPS Take by mouth.    . Dalfampridine (4-AMINOPYRIDINE) POWD 5 mg by  Does not apply route 3 (three) times daily. 5mg  capsules, one tid    . denosumab (PROLIA) 60 MG/ML SOLN injection Inject 60 mg into the skin every 6 (six) months. Administer in upper arm, thigh, or abdomen    . incobotulinumtoxinA (XEOMIN)  100 units SOLR injection Inject 500 Units into the muscle every 3 (three) months.    Marland Kitchen lisinopril (PRINIVIL,ZESTRIL) 10 MG tablet Take 1 tablet by mouth daily.    Marland Kitchen MYRBETRIQ 50 MG TB24 tablet 50 mg.    . natalizumab (TYSABRI) 300 MG/15ML injection Inject into the vein.    Marland Kitchen nortriptyline (PAMELOR) 50 MG capsule Take 1 capsule by mouth daily.    . traMADol-acetaminophen (ULTRACET) 37.5-325 MG per tablet Take 1 tablet by mouth daily as needed.     No current facility-administered medications for this visit.     PAST MEDICAL HISTORY: Past Medical History:  Diagnosis Date  . Cervical myelopathy ([place])   . Multiple sclerosis ([place])   . Osteoporosis 2018   hip    PAST SURGICAL HISTORY: Past Surgical History:  Procedure Laterality Date  . CERVICAL FUSION  01/2001, 01/2004   C3-C6    FAMILY HISTORY: Family History  Problem Relation Age of Onset  . Transient ischemic attack Mother   . Pancreatic cancer Father   . Breast cancer Cousin     SOCIAL HISTORY:  Social History   Socioeconomic History  . Marital status: Married    Spouse name: Layza Summa  . Number of children: 0  . Years of education: College  . Highest education level: Not on file  Occupational History    Employer: OTHER    Comment: n/a  Social Needs  . Financial resource strain: Not on file  . Food insecurity:    Worry: Not on file    Inability: Not on file  . Transportation needs:    Medical: Not on file    Non-medical: Not on file  Tobacco Use  . Smoking status: Former Smoker    Packs/day: 1.00    Years: 15.00    Pack years: 15.00    Types: Cigarettes    Last attempt to quit: 01/21/1983    Years since quitting: 35.2  . Smokeless tobacco: Never Used  Substance and Sexual Activity  . Alcohol use: Yes    Alcohol/week: 0.0 standard drinks    Comment: occasionally- wine  . Drug use: No  . Sexual activity: Not on file  Lifestyle  . Physical activity:    Days per week: Not on file    Minutes per  session: Not on file  . Stress: Not on file  Relationships  . Social connections:    Talks on phone: Not on file    Gets together: Not on file    Attends religious service: Not on file    Active member of club or organization: Not on file    Attends meetings of clubs or organizations: Not on file    Relationship status: Not on file  . Intimate partner violence:    Fear of current or ex partner: Not on file    Emotionally abused: Not on file    Physically abused: Not on file    Forced sexual activity: Not on file  Other Topics Concern  . Not on file  Social History Narrative   Patient lives at home with her spouse.   Caffeine Use-none     PHYSICAL EXAM   Vitals:   03/31/18 1349  BP: 109/70  Pulse: 96  Weight: 109 lb (49.4 kg)  Height: 5\' 1"  (1.549 m)    Not recorded      Body mass index is 20.6 kg/m.  She has moderate spasticity of bilateral lower extremity, mild to moderate proximal lower extremity weakness,   GAIT/STANCE: She rely on her walker to get up from seated position, significant bilateral adductor muscle spasm, rubbing her knees together while walking, spastic, unsteady slow cautious gait, bilateral flat feet, difficulty to clear bilateral feet from the floor  DIAGNOSTIC DATA (LABS, IMAGING, TESTING)  - I reviewed patient records, labs, notes, testing and imaging myself where available.   ASSESSMENT AND PLAN  KHANH CORDNER is a 73 y.o. female   Spastic paraparesis.  Relapsing remitting multiple sclerosis with cervical cord involvement, history of spondylitic cervical myelopathy   Under electric stimulation we have performed xeomin injection today use 500 units  Right adductor longus 100 Right adductor magnus 50 units Right tibialis posterior 25 units Right flexor digitorum longus 75 units  Left adductor longus 100 units Left adductor magnus 50 Left tibialis posterior 25units Left flexor digitorum longus 75 units  Patient tolerate the  injection well, returned to 3 months for repeat injection   Marcial Pacas, M.D. Ph.D.  [facility] [address] [place], [place] [address] Ph: [phone] Fax: [phone]  CC: Hulan Fess, MD

## 2018-03-31 NOTE — Progress Notes (Signed)
**  Xeomin 100 units x 5 vials, NDC 0259-1610-01, Lot [id number], Exp 04/2020, office supply.//mck,rn** 

## 2018-04-27 ENCOUNTER — Telehealth: Payer: Self-pay | Admitting: Diagnostic Neuroimaging

## 2018-04-27 NOTE — Telephone Encounter (Addendum)
Called patient who stated she's having difficulty controlling her bowels. She has FU in May. I advised her it can be moved sooner and scheduled as a video visit. She asked to call back; she was busy. I advised she call back anytime, and we'll reschedule her for video FU. Patient verbalized understanding, appreciation.

## 2018-04-27 NOTE — Telephone Encounter (Signed)
Pt states for months she has been having bowel problems and the problem has worsen, please call.

## 2018-04-28 NOTE — Telephone Encounter (Signed)
Patient called and stated she has a tablet to do a video visit. Explained to her that we'll take all precautions to reduce any security or privacy concerns. This will be treated like an office visit, and we will file with your insurance.  She consented to video visit. Pt's email is [email or web address]  Pt understands that the cisco webex software must be downloaded and operational on the device pt plans to use for the visit. Reviewed e mail, download with her. EMR updated. She  verbalized understanding, appreciation. E mail sent.

## 2018-05-04 ENCOUNTER — Other Ambulatory Visit: Payer: Self-pay

## 2018-05-04 ENCOUNTER — Ambulatory Visit (INDEPENDENT_AMBULATORY_CARE_PROVIDER_SITE_OTHER): Payer: Medicare Other | Admitting: Diagnostic Neuroimaging

## 2018-05-04 DIAGNOSIS — G35 Multiple sclerosis: Secondary | ICD-10-CM | POA: Diagnosis not present

## 2018-05-04 DIAGNOSIS — R29898 Other symptoms and signs involving the musculoskeletal system: Secondary | ICD-10-CM

## 2018-05-04 DIAGNOSIS — R159 Full incontinence of feces: Secondary | ICD-10-CM | POA: Diagnosis not present

## 2018-05-04 NOTE — Telephone Encounter (Signed)
Called patient who is trying to use tablet for webex. She has deleted webex meeting e mail. She asked me to resend it so she can download again. I resent webex e mail. She will call back if she has issues.

## 2018-05-04 NOTE — Telephone Encounter (Signed)
Pt doesn't have authentication to her google play store to download app. Is there anyother way pt can continue appt

## 2018-05-04 NOTE — Progress Notes (Signed)
    Virtual Visit via Telephone Note  I connected with Brock Ra on 05/04/18 at  3:30 PM EDT by telephone and verified that I am speaking with the correct person using two identifiers.   I discussed the limitations, risks, security and privacy concerns of performing an evaluation and management service by telephone and the availability of in person appointments. I also discussed with the patient that there may be a patient responsible charge related to this service. The patient expressed understanding and agreed to proceed.   History of Present Illness:  - having more bowel incontinence issues x 1-2 months - tolerating tysabri - left hand weakness is increasing    Observations/Objective:  - awake and alert   Assessment and Plan:  73 y.o. female here with  bilateral lower extremity spasticity, weakness,with brisk reflexes in upper and lower extremities, left Hoffman sign, bilateral upgoing toes. MRIs from 2005 and 2015 demonstrate multiple spinal cord lesions. Now suspect demyelinating disease, with first attack in 2002, second attack in 2003, and 3rd attack in 2014.  Then with some progression of symptoms since mid-2017.  Some worsening balance since Jan 2019.   MULTIPLE SCLEROSIS - continue tysabri - check labs (CBC, CMP, JCV antibody) --> tomorrow with infusion  BOWEL INCONTINENCE - check MRI cervical and thoracic (with and without)  - add fiber supplement  Orders Placed This Encounter  Procedures  . MR CERVICAL SPINE W WO CONTRAST  . MR THORACIC SPINE W WO CONTRAST  . CBC with Differential/Platelet  . Comprehensive metabolic panel  . Stratify JCV Ab (w/ Index) w/ Rflx    Follow Up Instructions:  - Return in about 6 months (around 11/03/2018).    I discussed the assessment and treatment plan with the patient. The patient was provided an opportunity to ask questions and all were answered. The patient agreed with the plan and demonstrated an understanding of  the instructions.   The patient was advised to call back or seek an in-person evaluation if the symptoms worsen or if the condition fails to improve as anticipated.  I provided 15 minutes of non-face-to-face time during this encounter.   Penni Bombard, MD 0/37/5436, 0:67 PM Certified in Neurology, Neurophysiology and Neuroimaging  [facility] [address], [place], [place] [address] [phone]

## 2018-05-05 ENCOUNTER — Telehealth: Payer: Self-pay | Admitting: Diagnostic Neuroimaging

## 2018-05-05 NOTE — Addendum Note (Signed)
Addended by: Inis Sizer D on: 05/05/2018 03:56 PM   Modules accepted: Orders

## 2018-05-05 NOTE — Telephone Encounter (Signed)
Medicare/aarp order sent to GI. No auth they will reach out to the pt to schedule.  °

## 2018-05-05 NOTE — Progress Notes (Signed)
JCV specimen placed in [facility] lock box for pick up.

## 2018-05-06 LAB — COMPREHENSIVE METABOLIC PANEL
ALT: 13 IU/L (ref 0–32)
AST: 14 IU/L (ref 0–40)
Albumin/Globulin Ratio: 3.2 — ABNORMAL HIGH (ref 1.2–2.2)
Albumin: 4.5 g/dL (ref 3.7–4.7)
Alkaline Phosphatase: 31 IU/L — ABNORMAL LOW (ref 39–117)
BUN/Creatinine Ratio: 35 — ABNORMAL HIGH (ref 12–28)
BUN: 21 mg/dL (ref 8–27)
Bilirubin Total: 0.3 mg/dL (ref 0.0–1.2)
CO2: 24 mmol/L (ref 20–29)
Calcium: 9 mg/dL (ref 8.7–10.3)
Chloride: 103 mmol/L (ref 96–106)
Creatinine, Ser: 0.6 mg/dL (ref 0.57–1.00)
GFR calc Af Amer: 106 mL/min/{1.73_m2} (ref >59)
GFR calc non Af Amer: 92 mL/min/{1.73_m2} (ref >59)
Globulin, Total: 1.4 g/dL — ABNORMAL LOW (ref 1.5–4.5)
Glucose: 77 mg/dL (ref 65–99)
Potassium: 3.6 mmol/L (ref 3.5–5.2)
Sodium: 144 mmol/L (ref 134–144)
Total Protein: 5.9 g/dL — ABNORMAL LOW (ref 6.0–8.5)

## 2018-05-06 LAB — CBC WITH DIFFERENTIAL/PLATELET
Basophils Absolute: 0.1 10*3/uL (ref 0.0–0.2)
Basos: 1 %
EOS (ABSOLUTE): 0.1 10*3/uL (ref 0.0–0.4)
Eos: 1 %
Hematocrit: 33.6 % — ABNORMAL LOW (ref 34.0–46.6)
Hemoglobin: 11.2 g/dL (ref 11.1–15.9)
Immature Grans (Abs): 0 10*3/uL (ref 0.0–0.1)
Immature Granulocytes: 1 %
Lymphocytes Absolute: 1.7 10*3/uL (ref 0.7–3.1)
Lymphs: 28 %
MCH: 31.5 pg (ref 26.6–33.0)
MCHC: 33.3 g/dL (ref 31.5–35.7)
MCV: 94 fL (ref 79–97)
Monocytes Absolute: 0.6 10*3/uL (ref 0.1–0.9)
Monocytes: 10 %
NRBC: 1 % — ABNORMAL HIGH (ref 0–0)
Neutrophils Absolute: 3.6 10*3/uL (ref 1.4–7.0)
Neutrophils: 59 %
Platelets: 285 10*3/uL (ref 150–450)
RBC: 3.56 x10E6/uL — ABNORMAL LOW (ref 3.77–5.28)
RDW: 13.3 % (ref 11.7–15.4)
WBC: 6.1 10*3/uL (ref 3.4–10.8)

## 2018-05-10 LAB — STRATIFY JCV AB (W/ INDEX) W/ RFLX
Index Value: 0.11
Stratify JCV (TM) Ab w/Reflex Inhibition: NEGATIVE

## 2018-05-19 ENCOUNTER — Telehealth: Payer: Self-pay | Admitting: Neurology

## 2018-05-19 ENCOUNTER — Ambulatory Visit
Admission: RE | Admit: 2018-05-19 | Discharge: 2018-05-19 | Disposition: A | Discharge status: Home / Self Care | Payer: Medicare Other | Source: Ambulatory Visit | Attending: Diagnostic Neuroimaging | Admitting: Diagnostic Neuroimaging

## 2018-05-19 ENCOUNTER — Other Ambulatory Visit: Payer: Self-pay

## 2018-05-19 DIAGNOSIS — G35 Multiple sclerosis: Secondary | ICD-10-CM | POA: Diagnosis not present

## 2018-05-19 DIAGNOSIS — R29898 Other symptoms and signs involving the musculoskeletal system: Secondary | ICD-10-CM

## 2018-05-19 DIAGNOSIS — R159 Full incontinence of feces: Secondary | ICD-10-CM

## 2018-05-19 MED ORDER — GADOBENATE DIMEGLUMINE 529 MG/ML IV SOLN
9.0000 mL | Freq: Once | INTRAVENOUS | Status: AC | PRN
  Administered 2018-05-19: 9 mL via INTRAVENOUS

## 2018-05-19 NOTE — Telephone Encounter (Signed)
I called and rs the patient for a later apt in June. DW

## 2018-05-19 NOTE — Telephone Encounter (Signed)
Patient would like to reschedule her Xeomin appt. Because she has an infusion scheduled for that day and cannot both on the same day.

## 2018-06-07 ENCOUNTER — Ambulatory Visit: Payer: Medicare Other | Admitting: Diagnostic Neuroimaging

## 2018-06-30 ENCOUNTER — Ambulatory Visit: Payer: Self-pay | Admitting: Neurology

## 2018-07-12 ENCOUNTER — Encounter: Payer: Self-pay | Admitting: Neurology

## 2018-07-12 ENCOUNTER — Ambulatory Visit (INDEPENDENT_AMBULATORY_CARE_PROVIDER_SITE_OTHER): Payer: Medicare Other | Admitting: Neurology

## 2018-07-12 ENCOUNTER — Other Ambulatory Visit: Payer: Self-pay

## 2018-07-12 VITALS — BP 123/84 | HR 107 | Temp 98.0°F | Ht 61.0 in | Wt 108.0 lb

## 2018-07-12 DIAGNOSIS — G35 Multiple sclerosis: Secondary | ICD-10-CM

## 2018-07-12 DIAGNOSIS — G8250 Quadriplegia, unspecified: Secondary | ICD-10-CM

## 2018-07-12 MED ORDER — INCOBOTULINUMTOXINA 100 UNITS IM SOLR
100.0000 [IU] | INTRAMUSCULAR | Status: DC
  Administered 2018-07-12: 100 [IU] via INTRAMUSCULAR

## 2018-07-12 NOTE — Progress Notes (Signed)
**  Xeomin 100 units x 5 vials, NDC 0259-1610-01, Lot [id number], Exp 04/2020, office supply.//mck,rn**

## 2018-07-12 NOTE — Progress Notes (Signed)
PATIENT: Amy Bray DOB: Aug 25, 1945    HISTORICAL  Amy Bray is a 73 years old right-handed female , seen in refer by my colleague Dr. Leta Baptist for evaluation of EMG guided botulism toxin injection for bilateral lower extremity spasticity, gait abnormality, initial evaluation was on September 11 2016.   This is the first time I have seen this patient, I also performed electrical stimulation guided xeomin injection for spastic paraplegia, I used 400 units of xeomin on September 11 2016   She presented with right leg stiffness, spasm, weakness in 2002, was diagnosed with cervical spine disease, had anterior cervical vasectomy and fusion in 2003. Following the surgery, her right leg weakness and spasms significantly improved, however a year later the symptoms returned, she ultimately had second cervical spine surgery in 2006, following second surgery, she only has mild improvement of her symptoms,  Over the years, she continue have progressive worsening spastic bilateral lower extremity, gait abnormality, eventually leading to this diagnosis of relapsing remitting multiple sclerosis,  She was treated with Dwyane Dee over the past few years, her symptoms has been steady.  We have personally reviewed most recent MRI cervical spine in September 2017, 3 small T2 hyper intensity foci within the spinal cord, adjacent to C5-6 to the left, adjacent to C6-7 to the right, and adjacent to T2, no change compared to previous scan in 2015, evidence of surgical fusion from C3-C6.  MRI of brain showed few scattered T2/FLAIR hyperintensity foci in the subcortical deep matter of the frontal area,  Since Tysarbri infusion, her symptoms overall has been steady, but over the years, she noticed increased bilateral lower extremity muscle spasticity, especially at nighttime, she described her left joint worsens stomach, jerking movement, while ambulating, she noticed bilateral knees rubbing towards each other,  bilateral leg tends to "worse each other. She rely on her walker now.  Previously she had EMG guided Botox injection at [facility] for bilateral lower extremity spasticity  with very good result  UPDATE Dec 5th 2018: Responded very well to previous injection September 11, 2016, there was no significant side effect noticed, we used 400 units of xeomin, today she wants to emphasize on left lower extremity spasm, hip flexion,  UPDATE March 23 2017: She responded very well to previous xeomin injection 400 units to bilateral lower extremity, we used 600 units total, with emphasized on bilateral hip adduction, flexion  UPDATE June 24 2017: We used 500 units xeomin today, she denies significant side effect with 600 units last time, insurance maximum 500 units  UPDATE Sept 11 2019: She responded well to previous injection but the benefit only last about 3 months, at the end of the 3 months, she developed increased spasticity of bilateral leg, adduction of bilateral leg  UPDATE July 12 2018: She responded well to previous injection on March 31 2018.  There was no significant side effect noticed, noticed recurrent bilateral lower extremity at the end of injection cycle  REVIEW OF SYSTEMS: Full 14 system review of systems performed and notable only for gait abnormality, back pain   ALLERGIES: No Known Allergies  HOME MEDICATIONS: Current Outpatient Medications  Medication Sig Dispense Refill  . acetaminophen (TYLENOL) 500 MG tablet Take 500 mg by mouth as needed.    . baclofen (LIORESAL) 10 MG tablet Take 2 tablets (20 mg total) by mouth 3 (three) times daily. 180 tablet 12  . Cranberry-Vitamin C-Vitamin E (CRANBERRY PLUS VITAMIN C) 4200-20-3 MG-MG-UNIT CAPS Take by mouth.    Marland Kitchen  Dalfampridine (4-AMINOPYRIDINE) POWD 5 mg by Does not apply route 3 (three) times daily. 5mg  capsules, one tid    . denosumab (PROLIA) 60 MG/ML SOLN injection Inject 60 mg into the skin every 6 (six) months. Administer in upper arm,  thigh, or abdomen    . incobotulinumtoxinA (XEOMIN) 100 units SOLR injection Inject 500 Units into the muscle every 3 (three) months.    Marland Kitchen lisinopril (PRINIVIL,ZESTRIL) 10 MG tablet Take 1 tablet by mouth daily.    . natalizumab (TYSABRI) 300 MG/15ML injection Inject into the vein.    Marland Kitchen nortriptyline (PAMELOR) 50 MG capsule Take 1 capsule by mouth daily.    Marland Kitchen oxybutynin (DITROPAN-XL) 5 MG 24 hr tablet Take 5 mg by mouth daily.    . traMADol-acetaminophen (ULTRACET) 37.5-325 MG per tablet Take 1 tablet by mouth daily as needed.     No current facility-administered medications for this visit.     PAST MEDICAL HISTORY: Past Medical History:  Diagnosis Date  . Cervical myelopathy ([place])   . Multiple sclerosis ([place])   . Osteoporosis 2018   hip    PAST SURGICAL HISTORY: Past Surgical History:  Procedure Laterality Date  . CERVICAL FUSION  01/2001, 01/2004   C3-C6    FAMILY HISTORY: Family History  Problem Relation Age of Onset  . Transient ischemic attack Mother   . Pancreatic cancer Father   . Breast cancer Cousin     SOCIAL HISTORY:  Social History   Socioeconomic History  . Marital status: Married    Spouse name: Lilyanah Celestin  . Number of children: 0  . Years of education: College  . Highest education level: Not on file  Occupational History    Employer: OTHER    Comment: n/a  Social Needs  . Financial resource strain: Not on file  . Food insecurity    Worry: Not on file    Inability: Not on file  . Transportation needs    Medical: Not on file    Non-medical: Not on file  Tobacco Use  . Smoking status: Former Smoker    Packs/day: 1.00    Years: 15.00    Pack years: 15.00    Types: Cigarettes    Quit date: 01/21/1983    Years since quitting: 35.4  . Smokeless tobacco: Never Used  Substance and Sexual Activity  . Alcohol use: Yes    Alcohol/week: 0.0 standard drinks    Comment: occasionally- wine  . Drug use: No  . Sexual activity: Not on file  Lifestyle   . Physical activity    Days per week: Not on file    Minutes per session: Not on file  . Stress: Not on file  Relationships  . Social Herbalist on phone: Not on file    Gets together: Not on file    Attends religious service: Not on file    Active member of club or organization: Not on file    Attends meetings of clubs or organizations: Not on file    Relationship status: Not on file  . Intimate partner violence    Fear of current or ex partner: Not on file    Emotionally abused: Not on file    Physically abused: Not on file    Forced sexual activity: Not on file  Other Topics Concern  . Not on file  Social History Narrative   Patient lives at home with her spouse.   Caffeine Use-none     PHYSICAL EXAM  Vitals:   07/12/18 1035  BP: 123/84  Pulse: (!) 107  Temp: 98 F (36.7 C)  Weight: 108 lb (49 kg)  Height: 5\' 1"  (1.549 m)    Not recorded      Body mass index is 20.41 kg/m.  PHYSICAL EXAMNIATION:  MOTOR: She has moderate spasticity of bilateral lower extremity, mild to moderate proximal lower extremity weakness,    GAIT/STANCE: She rely on her walker to get up from seated position, significant bilateral adductor muscle spasm, rubbing her knees together while walking, spastic, unsteady slow cautious gait, bilateral flat feet, difficulty to clear bilateral feet from the floor DIAGNOSTIC DATA (LABS, IMAGING, TESTING)  - I reviewed patient records, labs, notes, testing and imaging myself where available.   ASSESSMENT AND PLAN  Amy Bray is a 73 y.o. female   Spastic paraparesis.  Relapsing remitting multiple sclerosis with cervical cord involvement, history of spondylitic cervical myelopathy   Under electric stimulation, we have performed xeomin injection today use 500 units  Right adductor longus 150 Right adductor magnus 50 units Right tibialis posterior 50 units Right flexor digitorum longus 50 units  Left adductor longus 100 units  Left adductor magnus 50 Left tibialis posterior 25units Left flexor digitorum longus 25 units  Patient tolerate the injection well, returned to 3 months for repeat injection   Marcial Pacas, M.D. Ph.D.  [facility] [address] [place], [place] [address] Ph: [phone] Fax: [phone]  CC: Hulan Fess, MD

## 2018-08-02 ENCOUNTER — Telehealth: Payer: Self-pay | Admitting: Diagnostic Neuroimaging

## 2018-08-02 DIAGNOSIS — G35 Multiple sclerosis: Secondary | ICD-10-CM

## 2018-08-02 DIAGNOSIS — G8220 Paraplegia, unspecified: Secondary | ICD-10-CM

## 2018-08-02 NOTE — Telephone Encounter (Signed)
Pt is requesting a call back , no information was provided

## 2018-08-02 NOTE — Telephone Encounter (Signed)
Called patient who stated she's not had any therapy since Covid 19 started. She was improving and would like to have PT/OT ordered again. She feels being out of therapy has made her worse such as difficulty getting out of bed and more walking difficulty.  She also stated she would like to take CBD tablets and wants to be sure she can safely while on Tysabri. I advised her that Dr Leta Baptist doesn't recommend it because it has not been FDA approved, however it is available OTC. I advised will let him know and call her if he feels it is contraindicated. She  verbalized understanding, appreciation.

## 2018-08-03 NOTE — Telephone Encounter (Signed)
Spoke with patient and informed her Dr Leta Baptist stated she may take OTC CBD. advised her a referral to PT was placed. She  verbalized understanding, appreciation.

## 2018-08-03 NOTE — Telephone Encounter (Signed)
Ok to use OTC CBD.   Ok to setup for PT.  -VRP

## 2018-08-10 ENCOUNTER — Other Ambulatory Visit: Payer: Self-pay

## 2018-08-10 ENCOUNTER — Ambulatory Visit
Payer: Medicare Other | Attending: Diagnostic Neuroimaging | Admitting: Rehabilitative and Restorative Service Providers"

## 2018-08-10 DIAGNOSIS — M21372 Foot drop, left foot: Secondary | ICD-10-CM | POA: Insufficient documentation

## 2018-08-10 DIAGNOSIS — M6289 Other specified disorders of muscle: Secondary | ICD-10-CM | POA: Diagnosis present

## 2018-08-10 DIAGNOSIS — M6281 Muscle weakness (generalized): Secondary | ICD-10-CM | POA: Diagnosis present

## 2018-08-10 DIAGNOSIS — R2689 Other abnormalities of gait and mobility: Secondary | ICD-10-CM | POA: Insufficient documentation

## 2018-08-10 DIAGNOSIS — M21371 Foot drop, right foot: Secondary | ICD-10-CM | POA: Diagnosis present

## 2018-08-10 NOTE — Therapy (Signed)
[facility] [address] [place], [place], [address] Phone: [phone]   Fax:  [phone]  Physical Therapy Evaluation  Patient Details  Name: Amy Bray MRN: [id number] Date of Birth: 10-29-1945 Referring Provider (PT): Bess Harvest, MD  CLINIC OPERATION CHANGES: Outpatient [facility] is open at lower capacity following universal masking, social distancing, and patient screening.  The patient's COVID risk of complications score is 3.   Encounter Date: 08/10/2018  PT End of Session - 08/10/18 1428    Visit Number  1    Number of Visits  17    Date for PT Re-Evaluation  10/09/18    Authorization Type  Medicare and [place]- 10th visit progress note needed    PT Start Time  5993    PT Stop Time  1400    PT Time Calculation (min)  43 min       Past Medical History:  Diagnosis Date  . Cervical myelopathy ([place])   . Multiple sclerosis ([place])   . Osteoporosis 2018   hip    Past Surgical History:  Procedure Laterality Date  . CERVICAL FUSION  01/2001, 01/2004   C3-C6    There were no vitals filed for this visit.   Subjective Assessment - 08/10/18 1332    Subjective  The patient reports that she has declined in mobility since the pool closed in March.  She reports her walking has declined and she has had 2 falls in the past 6 months.  She also reports her back pain is worse as her moiblity has declined.  She notes that getting out of bed and out of the shower is more challenging.    Pertinent History  cervical myelopathy, MS    Patient Stated Goals  To improve gait and balance.  She also notes getting out of bed is also more difficult.    Currently in Pain?  Yes    Pain Score  5     Pain Location  Back    Pain Orientation  Lower    Pain Descriptors / Indicators  Aching    Pain Onset  More than a month ago    Pain Frequency  Constant    Aggravating Factors   worse at times    Pain Relieving Factors   unsure         [facility] PT Assessment - 08/10/18 1337      Assessment   Medical Diagnosis  Multiple Sclerosis    Referring Provider (PT)  Bess Harvest, MD    Prior Therapy  known to our clinic from prior therapy      Precautions   Precautions  Fall      Restrictions   Weight Bearing Restrictions  No      Balance Screen   Has the patient fallen in the past 6 months  Yes    How many times?  2    Has the patient had a decrease in activity level because of a fear of falling?   Yes    Is the patient reluctant to leave their home because of a fear of falling?   No      Home Environment   Living Environment  Private residence    Living Arrangements  Spouse/significant other    Type of [place] Access  Level entry    Home Layout  One level    [place] - 4 wheels;Cane - single point;Grab  bars - tub/shower    Additional Comments  Small lip to get out of the shower      Prior Function   Level of Independence  Requires assistive device for independence    Leisure  Enjoys swimming for exercise      Sensation   Light Touch  Appears Intact      ROM / Strength   AROM / PROM / Strength  AROM;Strength      AROM   Overall AROM Comments  The patient has tightness in bilateral hamstrings and bilateral hip adductors.  She also is tight in gastrocsoleous complex.      Strength   Overall Strength Comments  2/5 for bilateral hip flexion, 3/5 for bilateral knee extension, 2/5 for bilateral knee flexion, 3/5 bilateral ankle DF.      Bed Mobility   Bed Mobility  Supine to Sit;Sit to Supine    Supine to Sit  Minimal Assistance - Patient > 75%    Sit to Supine  Minimal Assistance - Patient > 75%      Transfers   Transfers  Sit to Stand;Stand to Sit    Sit to Stand  5: Supervision;With armrests    Stand to Sit  5: Supervision      Ambulation/Gait   Ambulation/Gait  Yes    Ambulation/Gait Assistance  5: Supervision    Gait velocity  0.11 ft/sec                 Objective measurements completed on examination: See above findings.      [facility] Adult PT Treatment/Exercise - 08/10/18 1337      Ambulation/Gait   Ambulation Distance (Feet)  75 Feet    Assistive device  4-wheeled walker    Gait Pattern  Step-to pattern;Decreased stride length;Decreased stance time - right;Decreased stance time - left;Decreased dorsiflexion - right;Decreased dorsiflexion - left;Decreased hip/knee flexion - right;Decreased hip/knee flexion - left;Poor foot clearance - right;Poor foot clearance - left;Narrow base of support;Trunk flexed;Left genu recurvatum    Ambulation Surface  Level;Indoor    Gait Comments  PT adjusted the patient's rollator RW height and this did help improve standing posture with gait and step length.             PT Education - 08/10/18 1427    Education Details  discussed walker height and modified today    Person(s) Educated  Patient    Methods  Demonstration;Explanation;Handout    Comprehension  Verbalized understanding;Returned demonstration       PT Short Term Goals - 08/10/18 1431      PT SHORT TERM GOAL #1   Title  The patient will return demo HEP for LE strengthening, stretching, and general balance/mobility.    Time  4    Period  Weeks    Target Date  09/09/18      PT SHORT TERM GOAL #2   Title  Perform Berg balance test with LTG to follow.     Time  4    Period  Weeks    Target Date  09/09/18      PT SHORT TERM GOAL #3   Title  The patient will improve gait speed from 0.11 ft/sec to > or equal to 0.3 ft/sec to demo improving mobility.    Time  4    Period  Weeks    Target Date  09/09/18      PT SHORT TERM GOAL #4   Title  The patient will move from supine>sitting  mod indep.    Time  4    Period  Weeks    Target Date  09/09/18      PT SHORT TERM GOAL #5   Title  Reduce low back pain to < or equal to 2/10 (from 5/10).    Time  4    Period  Weeks    Target Date  09/09/18        PT Long  Term Goals - 08/10/18 1433      PT LONG TERM GOAL #1   Title  The patient will return demo progression of HEP for post d/c routine + return to pool program.    Time  8    Period  Weeks    Target Date  10/09/18      PT LONG TERM GOAL #2   Title  The patient will improve gait speed from 0.11 ft/sec to > or equal to 0.5  ft/sec to demo improving household ambulation/ gait efficiency.    Time  8    Period  Weeks    Target Date  10/09/18      PT LONG TERM GOAL #3   Title  The patient will ambulate >300 ft nonstop with rollator RW mod indep for limited community mobility (walking into/out of appointments).      Time  8    Period  Weeks    Target Date  10/09/18      PT LONG TERM GOAL #4   Title  The patient will move sit<>stand 5/5 trial with UE support without requiring multiple attempts.     Time  8    Period  Weeks    Target Date  10/09/18      PT LONG TERM GOAL #5   Title  The patient will move from sitting>supine mod indep for bed mobility.    Time  8    Period  Weeks    Target Date  10/09/18             Plan - 08/10/18 1436    Clinical Impression Statement  The patient is a 73 yo female known to our clinic from prior physical therapy presenting today with worsening mobility after the pool closing in March due to the pandemic.  She also c/o worsening back pain, slower mobility, difficulty getting into/out of bed, difficulty getting out of the shower and 2 falls.  Her gait speed is significantly dec'd as compared with last year's measures. PT to address deficits to promote improved mobility.    Personal Factors and Comorbidities  Time since onset of injury/illness/exacerbation;Age;Social Background   patient notes stress at home due to husband's decling dementia.   Examination-Activity Limitations  Bathing;Bed Mobility;Locomotion Level;Transfers;Stand    Examination-Participation Restrictions  Cleaning;Shop;Community Activity;Laundry;Meal Prep    Stability/Clinical Decision  Making  Unstable/Unpredictable   due to falls, declining mobility, and changing social situation   Clinical Decision Making  High    Rehab Potential  Good    PT Frequency  2x / week   + eval   PT Duration  8 weeks    PT Treatment/Interventions  ADLs/Self Care Home Management;Neuromuscular re-education;Patient/family education;Stair training;Gait training;Functional mobility training;Therapeutic activities;Therapeutic exercise;Balance training;Wheelchair mobility training;Manual techniques;Orthotic Fit/Training;Aquatic Therapy;Passive range of motion;Energy conservation    PT Next Visit Plan  establish HEP, check Merrilee Jansky, work on gait/ further work on height of walker (adjusting slowly to get her comfortable with it), stretching, LE strengthening, bed mobility    Consulted and Agree with Plan of  Care  Patient       Patient will benefit from skilled therapeutic intervention in order to improve the following deficits and impairments:  Abnormal gait, Impaired tone, Decreased activity tolerance, Decreased strength, Pain, Decreased balance, Decreased mobility, Difficulty walking, Impaired flexibility, Postural dysfunction  Visit Diagnosis: 1. Other abnormalities of gait and mobility   2. Muscle weakness (generalized)   3. Muscle hypertonicity        Problem List Patient Active Problem List   Diagnosis Date Noted  . Quadriplegia, unspecified ([place]) 12/24/2016  . Spastic paresis 09/11/2016  . Multiple sclerosis ([place]) 09/11/2016  . Muscle spasticity 10/31/2013  . Nerve root pain 01/31/2013  . Cervical myelopathy ([place]) 05/05/2011  . BP (high blood pressure) 05/05/2011  . Restless leg 05/05/2011  . Arthralgia, sacroiliac 05/05/2011    Primus Gritton, PT 08/10/2018, 2:45 PM  [facility] [address] [place], [place], [address] Phone: [phone]   Fax:  [phone]  Name: HELIA HAESE MRN: [id number] Date of Birth:  Jan 27, 1945

## 2018-08-16 ENCOUNTER — Ambulatory Visit: Payer: Medicare Other | Admitting: Physical Therapy

## 2018-08-16 ENCOUNTER — Other Ambulatory Visit: Payer: Self-pay

## 2018-08-16 ENCOUNTER — Encounter: Payer: Self-pay | Admitting: Physical Therapy

## 2018-08-16 DIAGNOSIS — M21371 Foot drop, right foot: Secondary | ICD-10-CM

## 2018-08-16 DIAGNOSIS — M6281 Muscle weakness (generalized): Secondary | ICD-10-CM

## 2018-08-16 DIAGNOSIS — R2689 Other abnormalities of gait and mobility: Secondary | ICD-10-CM | POA: Diagnosis not present

## 2018-08-16 DIAGNOSIS — M6289 Other specified disorders of muscle: Secondary | ICD-10-CM

## 2018-08-16 DIAGNOSIS — M21372 Foot drop, left foot: Secondary | ICD-10-CM

## 2018-08-16 NOTE — Therapy (Addendum)
[facility] [address] [place], [place], [address] Phone: [phone]   Fax:  [phone]  Physical Therapy Treatment  Patient Details  Name: Amy Bray MRN: [id number] Date of Birth: 1945/11/06 Referring Provider (PT): Bess Harvest, MD   Encounter Date: 08/16/2018   CLINIC OPERATION CHANGES: Outpatient Neuro Rehab is open at lower capacity following universal masking, social distancing, and patient screening.  The patient's COVID risk of complications score is 3.   PT End of Session - 08/16/18 1221    Visit Number  2    Number of Visits  17    Date for PT Re-Evaluation  10/09/18    Authorization Type  Medicare and [place]- 10th visit progress note needed    PT Start Time  1100    PT Stop Time  1150    PT Time Calculation (min)  50 min    Activity Tolerance  Patient tolerated treatment well    Behavior During Therapy  WFL for tasks assessed/performed       Past Medical History:  Diagnosis Date  . Cervical myelopathy ([place])   . Multiple sclerosis ([place])   . Osteoporosis 2018   hip    Past Surgical History:  Procedure Laterality Date  . CERVICAL FUSION  01/2001, 01/2004   C3-C6    There were no vitals filed for this visit.  Subjective Assessment - 08/16/18 1112    Subjective  Pt ambulating very slowly today, difficulty advancing LLE and knees "rubbing together".  Has started using CBD oil at night and she has been sleeping much better.    Pertinent History  cervical myelopathy, MS    Patient Stated Goals  To improve gait and balance.  She also notes getting out of bed is also more difficult.    Currently in Pain?  Yes    Pain Score  5     Pain Location  Coccyx    Pain Orientation  Lower    Pain Descriptors / Indicators  Sore    Pain Type  Chronic pain    Pain Onset  More than a month ago         [facility] PT Assessment - 08/16/18 1116      Standardized Balance Assessment   Standardized Balance  Assessment  Berg Balance Test      Berg Balance Test   Sit to Stand  Able to stand  independently using hands    Standing Unsupported  Able to stand 2 minutes with supervision    Sitting with Back Unsupported but Feet Supported on Floor or Stool  Able to sit safely and securely 2 minutes    Stand to Sit  Controls descent by using hands    Transfers  Able to transfer safely, definite need of hands    Standing Unsupported with Eyes Closed  Able to stand 10 seconds with supervision    Standing Unsupported with Feet Together  Needs help to attain position but able to stand for 30 seconds with feet together    From Standing, Reach Forward with Outstretched Arm  Reaches forward but needs supervision    From Standing Position, Pick up Object from Floor  Unable to try/needs assist to keep balance    From Standing Position, Turn to Look Behind Over each Shoulder  Looks behind one side only/other side shows less weight shift    Turn 360 Degrees  Needs assistance while turning    Standing Unsupported, Alternately Place Feet on Step/Stool  Needs assistance to keep from falling or unable to try    Standing Unsupported, One Foot in Front  Needs help to step but can hold 15 seconds    Standing on One Leg  Unable to try or needs assist to prevent fall    Total Score  25    Berg comment:  25/56                   [facility] Adult PT Treatment/Exercise - 08/16/18 1207      Bed Mobility   Bed Mobility  Supine to Sit;Sit to Supine    Supine to Sit  Minimal Assistance - Patient > 75%    Sit to Supine  Minimal Assistance - Patient > 75%      Ambulation/Gait   Ambulation/Gait  Yes    Ambulation/Gait Assistance  4: Min assist;5: Supervision    Ambulation/Gait Assistance Details  pt ambulating much more slowly today and demonstrating L toe drag with each step.  Therapist assisted with keeping rollator more stable to allow pt to fully weight shift forwards and laterally to allow full clearance and step  length of contralateral LE.  Also provided cues for increased knee flexion to improve LE clearance.  By end of session pt able to demonstrate improved step length and clearance with supervision    Ambulation Distance (Feet)  100 Feet    Assistive device  4-wheeled walker    Gait Pattern  Step-to pattern;Step-through pattern;Decreased step length - right;Decreased step length - left;Decreased stance time - left;Decreased stride length;Decreased hip/knee flexion - right;Decreased hip/knee flexion - left;Decreased dorsiflexion - right;Decreased dorsiflexion - left;Right genu recurvatum;Left genu recurvatum;Trunk flexed;Narrow base of support;Poor foot clearance - left;Poor foot clearance - right    Ambulation Surface  Level;Indoor      Therapeutic Activites    Therapeutic Activities  Other Therapeutic Activities    Other Therapeutic Activities  Discussed other options for DF assistance and improved LE clearance to improve efficiency and safety of gait.  Discussed AFO options which pt said was painful but was states she was wearing the AFO in her shoe without the insole for cushioning.  Pt also reports she didn't gradually increase wear time with AFO.  Pt continues to be hesitant to try another type of AFO with the insole due to pain and hypersensitivity to AFO touching her LE.  Also discussed another option for functional electrical stimulation training: Bioness to allow for more adjustment of parameters and timing of activation.  Pt is willing to try Bioness.  Pt is also hoping to re-start aquatic therapy. Will discuss with primary aquatic therapist       Access Code: [email or web address]  URL: [email or web address]  Date: 08/16/2018  Prepared by: Misty Stanley   Exercises Supine Lower Trunk Rotation - 10 reps - 1 sets - 5 hold - 1x daily - 7x weekly Soleus Stretch on Wall - 2 reps - 1 sets - 30 hold - 1x daily - 7x weekly sitting toe stretch - 2 reps - 1 sets - 30 seconds hold - 2x daily - 7x  weekly Seated March - 10 reps - 1-2 sets - 1x daily - 7x weekly Supine Butterfly Groin Stretch - 3 sets - 30 second hold - 2x daily - 7x weekly Supine Bridge with Resistance Band - 10 reps - 1 sets - 1x daily - 7x weekly    PT Education - 08/16/18 1220    Education Details  decline in BERG score, see TA for  discussion regarding AFO, aquatic therapy and Bioness, adjusted HEP    Person(s) Educated  Patient    Methods  Explanation;Demonstration;Handout    Comprehension  Verbalized understanding;Returned demonstration       PT Short Term Goals - 08/16/18 1221      PT SHORT TERM GOAL #1   Title  The patient will return demo HEP for LE strengthening, stretching, and general balance/mobility.    Time  4    Period  Weeks    Target Date  09/09/18      PT SHORT TERM GOAL #2   Title  Perform Berg balance test with LTG to follow.     Baseline  25/56    Time  4    Period  Weeks    Status  Achieved    Target Date  09/09/18      PT SHORT TERM GOAL #3   Title  The patient will improve gait speed from 0.11 ft/sec to > or equal to 0.3 ft/sec to demo improving mobility.    Time  4    Period  Weeks    Target Date  09/09/18      PT SHORT TERM GOAL #4   Title  The patient will move from supine>sitting mod indep.    Time  4    Period  Weeks    Target Date  09/09/18      PT SHORT TERM GOAL #5   Title  Reduce low back pain to < or equal to 2/10 (from 5/10).    Time  4    Period  Weeks    Target Date  09/09/18        PT Long Term Goals - 08/16/18 1222      PT LONG TERM GOAL #1   Title  The patient will return demo progression of HEP for post d/c routine + return to pool program (LTG due on 10/09/2018)    Time  8    Period  Weeks      PT LONG TERM GOAL #2   Title  The patient will improve gait speed from 0.11 ft/sec to > or equal to 0.5  ft/sec to demo improving household ambulation/ gait efficiency.    Time  8    Period  Weeks      PT LONG TERM GOAL #3   Title  The patient will  ambulate >300 ft nonstop with rollator RW mod indep for limited community mobility (walking into/out of appointments).      Time  8    Period  Weeks      PT LONG TERM GOAL #4   Title  The patient will move sit<>stand 5/5 trial with UE support without requiring multiple attempts.     Time  8    Period  Weeks      PT LONG TERM GOAL #5   Title  The patient will move from sitting>supine mod indep for bed mobility.    Time  8    Period  Weeks      Additional Long Term Goals   Additional Long Term Goals  Yes      PT LONG TERM GOAL #6   Title  Pt will decrease falls risk as indicated by improvement in BERG balance score to >/= 30/56    Baseline  25/56    Time  8    Period  Weeks    Status  New    Target Date  10/09/18  Plan - 08/16/18 1224    Clinical Impression Statement  Continued assessment of balance with BERG.  Pt has experienced a significant decline in her balance as indicated by a drop in her BERG balance score to 25/56 indicating high risk for falls and increased reliance on UE for stability and safety.  Continued to discuss with pt alternative bracing options, functional electrical stimulation and return to aquatic therapy.  Pt is willing to trial Bioness but continues to refuse trial of other types of AFO at this time.  Will continue to educate pt on purpose of bracing and options for more successful use of AFO.  Began to update patient's current HEP focusing on hamstring and hip strengthening and increasing ROM of hip ADD mm.  Will continue to address in order to progress towards LTG.    Personal Factors and Comorbidities  Time since onset of injury/illness/exacerbation;Age;Social Background   patient notes stress at home due to husband's decling dementia.   Examination-Activity Limitations  Bathing;Bed Mobility;Locomotion Level;Transfers;Stand    Examination-Participation Restrictions  Cleaning;Shop;Community Activity;Laundry;Meal Prep    Stability/Clinical  Decision Making  Unstable/Unpredictable   due to falls, declining mobility, and changing social situation   Rehab Potential  Good    PT Frequency  2x / week   + eval   PT Duration  8 weeks    PT Treatment/Interventions  ADLs/Self Care Home Management;Neuromuscular re-education;Patient/family education;Stair training;Gait training;Functional mobility training;Therapeutic activities;Therapeutic exercise;Balance training;Wheelchair mobility training;Manual techniques;Orthotic Fit/Training;Aquatic Therapy;Passive range of motion;Energy conservation;Electrical Stimulation    PT Next Visit Plan  continue to adjust HEP focusing on hamstring strength, glute strength, hip flexor strength, stretching hip flexor mm.  Needs a lot of work on flat bed mobility.  Will she be able to return to aquatic with [facility]?  She would like to try Bioness but e-stim would need to be added to POC.  She is not open to other AFO options at this time, continue to educate and possibly trial other AFO.    PT Home Exercise Plan  Access Code: [email or web address]    Consulted and Agree with Plan of Care  Patient       Patient will benefit from skilled therapeutic intervention in order to improve the following deficits and impairments:  Abnormal gait, Impaired tone, Decreased activity tolerance, Decreased strength, Pain, Decreased balance, Decreased mobility, Difficulty walking, Impaired flexibility, Postural dysfunction  Visit Diagnosis: 1. Other abnormalities of gait and mobility   2. Muscle weakness (generalized)   3. Muscle hypertonicity   4. Foot drop, left   5. Foot drop, right        Problem List Patient Active Problem List   Diagnosis Date Noted  . Quadriplegia, unspecified ([place]) 12/24/2016  . Spastic paresis 09/11/2016  . Multiple sclerosis ([place]) 09/11/2016  . Muscle spasticity 10/31/2013  . Nerve root pain 01/31/2013  . Cervical myelopathy ([place]) 05/05/2011  . BP (high blood pressure) 05/05/2011  . Restless leg  05/05/2011  . Arthralgia, sacroiliac 05/05/2011   Rico Junker, PT, DPT 08/16/18    12:53 PM    [facility] [address] [place], [place], [address] Phone: [phone]   Fax:  [phone]  Name: Amy Bray MRN: [id number] Date of Birth: 05/08/1945

## 2018-08-16 NOTE — Patient Instructions (Addendum)
Access Code: [email or web address]  URL: [email or web address]  Date: 08/16/2018  Prepared by: Misty Stanley   Exercises Supine Lower Trunk Rotation - 10 reps - 1 sets - 5 hold - 1x daily - 7x weekly Soleus Stretch on Wall - 2 reps - 1 sets - 30 hold - 1x daily - 7x weekly sitting toe stretch - 2 reps - 1 sets - 30 seconds hold - 2x daily - 7x weekly Seated March - 10 reps - 1-2 sets - 1x daily - 7x weekly Supine Butterfly Groin Stretch - 3 sets - 30 second hold - 2x daily - 7x weekly Supine Bridge with Resistance Band - 10 reps - 1 sets - 1x daily - 7x weekly

## 2018-08-16 NOTE — Addendum Note (Signed)
Addended by: Misty Stanley F on: 08/16/2018 12:55 PM   Modules accepted: Orders

## 2018-08-18 ENCOUNTER — Other Ambulatory Visit: Payer: Self-pay

## 2018-08-18 ENCOUNTER — Ambulatory Visit: Payer: Medicare Other | Admitting: Physical Therapy

## 2018-08-18 DIAGNOSIS — R2689 Other abnormalities of gait and mobility: Secondary | ICD-10-CM

## 2018-08-18 DIAGNOSIS — M6281 Muscle weakness (generalized): Secondary | ICD-10-CM

## 2018-08-19 NOTE — Therapy (Signed)
[facility] [address] [place], [place], [address] Phone: [phone]   Fax:  [phone]  Physical Therapy Treatment  Patient Details  Name: Amy Bray MRN: [id number] Date of Birth: 01-Feb-1945 Referring Provider (PT): Bess Harvest, MD   Encounter Date: 08/18/2018  PT End of Session - 08/19/18 2057    Visit Number  3    Number of Visits  17    Date for PT Re-Evaluation  10/09/18    Authorization Type  Medicare and [place]- 10th visit progress note needed    PT Start Time  1206    PT Stop Time  1252    PT Time Calculation (min)  46 min    Equipment Utilized During Treatment  --   pool noodle   Activity Tolerance  Patient tolerated treatment well    Behavior During Therapy  [facility] for tasks assessed/performed       Past Medical History:  Diagnosis Date  . Cervical myelopathy ([place])   . Multiple sclerosis ([place])   . Osteoporosis 2018   hip    Past Surgical History:  Procedure Laterality Date  . CERVICAL FUSION  01/2001, 01/2004   C3-C6    There were no vitals filed for this visit.  Subjective Assessment - 08/19/18 2055    Subjective  Pt presents for aquatic therapy at [facility] - amb. very slowly into building; suggested pt sit on her rollator and be rolled into pool area to save time  - pt agreed    Pertinent History  cervical myelopathy, MS    Patient Stated Goals  To improve gait and balance.  She also notes getting out of bed is also more difficult.    Currently in Pain?  No/denies    Pain Onset  More than a month ago           Aquatic therapy:  Pool temp 87.6 degrees  Patient seen for aquatic therapy today.  Treatment took place in water 3.5-4 feet deep depending upon activity.  Pt entered and Exited the pool via step negotiation with use of hand rails with min assist for safety  Pt performed hamstring/heel cord stretch (runner's stretch) each leg 30 sec hold x 1 rep  Pt performed hip  extension, flexion and abduction 10 reps each direction RLE and LLE using viscosity of water for resistance Marching in place 10 reps each - cues for big movement and for slow speed for improved SLS  Pt performed forwards amb. 31m x 2 reps with UE support on pool noodle with therapist stabilizing noodle for assist with balance  - cues for incr. Step length Pt performed backwards amb. 1m x 1 rep and sideways amb. X 2 reps with UE support on noodle for assist with balance with CGA to min assist   Pt performed squats x 10 reps with UE support on pool wall Heel raises 10 reps with UE support on pool edge  Pt reclined in supine position with use of noodle under arms - pt performed hip abdct./adduction x 3 sets 10 reps;   bicycling LE's 3 sets 10 reps   Pt requires aquatic therapy for buoyancy of water for support and to be able to off load her body while minimizing fall risk; viscosity of water needed to provide resistance for strengthening trunk and LE musculature; buoyancy of water also needed for support for balance and to allow easier performance of exercises (buoyancy assisted) that are unable to be performed safely on  land                           PT Short Term Goals - 08/16/18 1221      PT SHORT TERM GOAL #1   Title  The patient will return demo HEP for LE strengthening, stretching, and general balance/mobility.    Time  4    Period  Weeks    Target Date  09/09/18      PT SHORT TERM GOAL #2   Title  Perform Berg balance test with LTG to follow.     Baseline  25/56    Time  4    Period  Weeks    Status  Achieved    Target Date  09/09/18      PT SHORT TERM GOAL #3   Title  The patient will improve gait speed from 0.11 ft/sec to > or equal to 0.3 ft/sec to demo improving mobility.    Time  4    Period  Weeks    Target Date  09/09/18      PT SHORT TERM GOAL #4   Title  The patient will move from supine>sitting mod indep.    Time  4    Period  Weeks     Target Date  09/09/18      PT SHORT TERM GOAL #5   Title  Reduce low back pain to < or equal to 2/10 (from 5/10).    Time  4    Period  Weeks    Target Date  09/09/18        PT Long Term Goals - 08/19/18 2058      PT LONG TERM GOAL #1   Title  The patient will return demo progression of HEP for post d/c routine + return to pool program (LTG due on 10/09/2018)    Time  8    Period  Weeks      PT LONG TERM GOAL #2   Title  The patient will improve gait speed from 0.11 ft/sec to > or equal to 0.5  ft/sec to demo improving household ambulation/ gait efficiency.    Time  8    Period  Weeks      PT LONG TERM GOAL #3   Title  The patient will ambulate >300 ft nonstop with rollator RW mod indep for limited community mobility (walking into/out of appointments).      Time  8    Period  Weeks      PT LONG TERM GOAL #4   Title  The patient will move sit<>stand 5/5 trial with UE support without requiring multiple attempts.     Time  8    Period  Weeks      PT LONG TERM GOAL #5   Title  The patient will move from sitting>supine mod indep for bed mobility.    Time  8    Period  Weeks      PT LONG TERM GOAL #6   Title  Pt will decrease falls risk as indicated by improvement in BERG balance score to >/= 30/56    Baseline  25/56    Time  8    Period  Weeks    Status  New              Patient will benefit from skilled therapeutic intervention in order to improve the following deficits and impairments:     Visit Diagnosis:  1. Other abnormalities of gait and mobility   2. Muscle weakness (generalized)        Problem List Patient Active Problem List   Diagnosis Date Noted  . Quadriplegia, unspecified ([place]) 12/24/2016  . Spastic paresis 09/11/2016  . Multiple sclerosis ([place]) 09/11/2016  . Muscle spasticity 10/31/2013  . Nerve root pain 01/31/2013  . Cervical myelopathy ([place]) 05/05/2011  . BP (high blood pressure) 05/05/2011  . Restless leg 05/05/2011  . Arthralgia,  sacroiliac 05/05/2011    Amy Bray, Amy Bray, PT, [place] 08/19/2018, 9:00 PM  [facility] [address] [place], [place], [address] Phone: [phone]   Fax:  [phone]  Name: Amy Bray MRN: [id number] Date of Birth: 11/21/1945

## 2018-08-23 ENCOUNTER — Ambulatory Visit: Payer: Medicare Other | Attending: Diagnostic Neuroimaging | Admitting: Physical Therapy

## 2018-08-23 ENCOUNTER — Other Ambulatory Visit: Payer: Self-pay

## 2018-08-23 DIAGNOSIS — M6281 Muscle weakness (generalized): Secondary | ICD-10-CM | POA: Insufficient documentation

## 2018-08-23 DIAGNOSIS — M21372 Foot drop, left foot: Secondary | ICD-10-CM | POA: Insufficient documentation

## 2018-08-23 DIAGNOSIS — R2689 Other abnormalities of gait and mobility: Secondary | ICD-10-CM | POA: Diagnosis not present

## 2018-08-23 DIAGNOSIS — M21371 Foot drop, right foot: Secondary | ICD-10-CM | POA: Diagnosis present

## 2018-08-23 DIAGNOSIS — M6289 Other specified disorders of muscle: Secondary | ICD-10-CM | POA: Diagnosis present

## 2018-08-23 NOTE — Therapy (Signed)
[facility] [address] [place], [place], [address] Phone: [phone]   Fax:  [phone]  Physical Therapy Treatment  Patient Details  Name: SANARI OFFNER MRN: [id number] Date of Birth: 10-31-1945 Referring Provider (PT): Bess Harvest, MD   Encounter Date: 08/23/2018  PT End of Session - 08/23/18 1749    Visit Number  4    Number of Visits  17    Date for PT Re-Evaluation  10/09/18    Authorization Type  Medicare and [place]- 10th visit progress note needed    PT Start Time  1301    PT Stop Time  1350    PT Time Calculation (min)  49 min    Equipment Utilized During Treatment  Other (comment)   pool noodle, ankle cuffs      Past Medical History:  Diagnosis Date  . Cervical myelopathy ([place])   . Multiple sclerosis ([place])   . Osteoporosis 2018   hip    Past Surgical History:  Procedure Laterality Date  . CERVICAL FUSION  01/2001, 01/2004   C3-C6    There were no vitals filed for this visit.  Subjective Assessment - 08/23/18 1746    Subjective  Pt presents for aquatic therapy at [facility] - arrived very early to have time to ambulate into building in time for 1:00 appt;  pt states she could not tell a big difference in her mobility after previous (first pool session this admission) aquatic therapy session, but states she knows it is going to take some time    Patient is accompained by:  Family member    Pertinent History  cervical myelopathy, MS    Patient Stated Goals  To improve gait and balance.  She also notes getting out of bed is also more difficult.    Currently in Pain?  No/denies    Pain Onset  More than a month ago      Aquatic therapy at [facility] - pool temp 87.0 degrees  Patient seen for aquatic therapy today.  Treatment took place in water 3.5-4 feet deep depending upon activity.  Pt entered and exited the pool via Step negotiation using bil. Hand rails with CGA to SBA.   Pt performed stretches for  bil. LE's - runner's stretch, heel cord stretch, and hip adductor stretch 30 sec hold each stretch each leg  Pt performed water walking 37m length of pool - forwards, backwards and sideways with UE support on pool noodle; cues to increase step length as able to do so Marching in place 15 reps; progressed to marching forwards, backwards with use of UE support on pool noodle  Pt performed hip flexion, extension, abduction, adduction and hip internal/external rotation with knee flexed at 90 degrees - 15 reps each direction each leg with use of buoyant ankle cuff for increased resistance with the eccentric contraction Knee flexion/extension 15 reps each leg with ankle cuff  Pt performed squats bil. LE's 10 reps - without UE support to facilitate improved dynamic standing balance  Pt performed closed chain hip extension with foot on noodle - pushing down toward floor - 7 reps on RLE due to c/o fatigue;  10 reps LLE   Pt reclined in supine position with use of noodle under arms - pt performed hip abdct./adduction 10 reps;  bicycling LE's 10 reps  Pt requires aquatic therapy for buoyancy of water for support and to be able to off load her body while minimizing fall risk; viscosity of water  needed to provide resistance for  strengthening trunk and LE musculature; buoyancy of water needed for support and assist with exercises to achieve greater AROM in water than able to be performed on land                           PT Short Term Goals - 08/23/18 1757      PT SHORT TERM GOAL #1   Title  The patient will return demo HEP for LE strengthening, stretching, and general balance/mobility.    Time  4    Period  Weeks    Target Date  09/09/18      PT SHORT TERM GOAL #2   Title  Perform Berg balance test with LTG to follow.     Baseline  25/56    Time  4    Period  Weeks    Status  Achieved    Target Date  09/09/18      PT SHORT TERM GOAL #3   Title  The patient will improve  gait speed from 0.11 ft/sec to > or equal to 0.3 ft/sec to demo improving mobility.    Time  4    Period  Weeks    Target Date  09/09/18      PT SHORT TERM GOAL #4   Title  The patient will move from supine>sitting mod indep.    Time  4    Period  Weeks    Target Date  09/09/18      PT SHORT TERM GOAL #5   Title  Reduce low back pain to < or equal to 2/10 (from 5/10).    Time  4    Period  Weeks    Target Date  09/09/18        PT Long Term Goals - 08/23/18 1757      PT LONG TERM GOAL #1   Title  The patient will return demo progression of HEP for post d/c routine + return to pool program (LTG due on 10/09/2018)    Time  8    Period  Weeks      PT LONG TERM GOAL #2   Title  The patient will improve gait speed from 0.11 ft/sec to > or equal to 0.5  ft/sec to demo improving household ambulation/ gait efficiency.    Time  8    Period  Weeks      PT LONG TERM GOAL #3   Title  The patient will ambulate >300 ft nonstop with rollator RW mod indep for limited community mobility (walking into/out of appointments).      Time  8    Period  Weeks      PT LONG TERM GOAL #4   Title  The patient will move sit<>stand 5/5 trial with UE support without requiring multiple attempts.     Time  8    Period  Weeks      PT LONG TERM GOAL #5   Title  The patient will move from sitting>supine mod indep for bed mobility.    Time  8    Period  Weeks      PT LONG TERM GOAL #6   Title  Pt will decrease falls risk as indicated by improvement in BERG balance score to >/= 30/56    Baseline  25/56    Time  8    Period  Weeks    Status  New  Plan - 08/23/18 1750    Clinical Impression Statement  Pt tolerated aquatic exercises well with few rest breaks needed; pt continues to need UE support on pool noodle for assistance with balance with water walking, however, improved balance noted today with pt able to hold onto noodle for stability without therapist assisting to stabilize  noodle as was needed in first pool session last week.  Pt performed closed chain hip strengthening with foot on noodle with pt reporting fatigue after 7 reps in RLE, but able to complete 10 reps with LLE.    PT Frequency  2x / week    PT Duration  8 weeks    PT Treatment/Interventions  ADLs/Self Care Home Management;Neuromuscular re-education;Patient/family education;Stair training;Gait training;Functional mobility training;Therapeutic activities;Therapeutic exercise;Balance training;Wheelchair mobility training;Manual techniques;Orthotic Fit/Training;Aquatic Therapy;Passive range of motion;Energy conservation;Electrical Stimulation    PT Next Visit Plan  continue to adjust HEP focusing on hamstring strength, glute strength, hip flexor strength, stretching hip flexor mm.  Needs a lot of work on flat bed mobility.    She would like to try Bioness but e-stim would need to be added to POC.  She is not open to other AFO options at this time, continue to educate and possibly trial other AFO.    PT Home Exercise Plan  Access Code: [email or web address]    Recommended Other Services  aquatic therapy resumed 08-18-18    Consulted and Agree with Plan of Care  Patient       Patient will benefit from skilled therapeutic intervention in order to improve the following deficits and impairments:  Abnormal gait, Impaired tone, Decreased activity tolerance, Decreased strength, Pain, Decreased balance, Decreased mobility, Difficulty walking, Impaired flexibility, Postural dysfunction  Visit Diagnosis: 1. Other abnormalities of gait and mobility   2. Muscle weakness (generalized)   3. Muscle hypertonicity        Problem List Patient Active Problem List   Diagnosis Date Noted  . Quadriplegia, unspecified ([place]) 12/24/2016  . Spastic paresis 09/11/2016  . Multiple sclerosis ([place]) 09/11/2016  . Muscle spasticity 10/31/2013  . Nerve root pain 01/31/2013  . Cervical myelopathy ([place]) 05/05/2011  . BP (high blood pressure)  05/05/2011  . Restless leg 05/05/2011  . Arthralgia, sacroiliac 05/05/2011    Jesua Tamblyn, [place], PT, [facility] 08/23/2018, 5:59 PM  [facility] [address] [place], [place], [address] Phone: [phone]   Fax:  [phone]  Name: BRITT THEARD MRN: [id number] Date of Birth: 09/28/1945

## 2018-08-25 ENCOUNTER — Telehealth: Payer: Self-pay | Admitting: *Deleted

## 2018-08-25 NOTE — Telephone Encounter (Signed)
Aminopyridine Rx faxed to [facility], received confirmation.

## 2018-08-25 NOTE — Telephone Encounter (Signed)
Received fax request to refill aminopyridine 5 mg caps. Placed on Dr [employer] desk for completion, signature.

## 2018-08-27 ENCOUNTER — Other Ambulatory Visit: Payer: Self-pay

## 2018-08-27 ENCOUNTER — Encounter: Payer: Self-pay | Admitting: Rehabilitative and Restorative Service Providers"

## 2018-08-27 ENCOUNTER — Ambulatory Visit: Payer: Medicare Other | Admitting: Rehabilitative and Restorative Service Providers"

## 2018-08-27 DIAGNOSIS — R2689 Other abnormalities of gait and mobility: Secondary | ICD-10-CM

## 2018-08-27 DIAGNOSIS — M6281 Muscle weakness (generalized): Secondary | ICD-10-CM

## 2018-08-27 DIAGNOSIS — M21372 Foot drop, left foot: Secondary | ICD-10-CM

## 2018-08-27 DIAGNOSIS — M21371 Foot drop, right foot: Secondary | ICD-10-CM

## 2018-08-27 DIAGNOSIS — M6289 Other specified disorders of muscle: Secondary | ICD-10-CM

## 2018-08-27 NOTE — Therapy (Signed)
[facility] [address] [place], [place], [address] Phone: [phone]   Fax:  [phone]  Physical Therapy Treatment  Patient Details  Name: Amy Bray MRN: [id number] Date of Birth: Apr 03, 1945 Referring Provider (PT): Bess Harvest, MD  CLINIC OPERATION CHANGES: [facility] is open at lower capacity following universal masking, social distancing, and patient screening.  The patient's COVID risk of complications score is 3.   Encounter Date: 08/27/2018  PT End of Session - 08/27/18 1250    Visit Number  5    Number of Visits  17    Date for PT Re-Evaluation  10/09/18    Authorization Type  Medicare and [place]- 10th visit progress note needed    PT Start Time  1103    PT Stop Time  1150    PT Time Calculation (min)  47 min    Activity Tolerance  Patient tolerated treatment well    Behavior During Therapy  WFL for tasks assessed/performed       Past Medical History:  Diagnosis Date  . Cervical myelopathy ([place])   . Multiple sclerosis ([place])   . Osteoporosis 2018   hip    Past Surgical History:  Procedure Laterality Date  . CERVICAL FUSION  01/2001, 01/2004   C3-C6    There were no vitals filed for this visit.  Subjective Assessment - 08/27/18 1118    Subjective  PT met patient in parking lot to walk in today due to her arriving late (11:03).  It took her approximately 10 minutes to walk from the parking drop off area to the clinic door.  We used manual wheelchair in clinic to come to back gym.   She had injection in her lumbar spine yesterday    Patient is accompained by:  Family member   husand with her   Pertinent History  cervical myelopathy, MS    Patient Stated Goals  To improve gait and balance.  She also notes getting out of bed is also more difficult.    Currently in Pain?  No/denies                       [facility] Adult PT Treatment/Exercise - 08/27/18 1251      Bed  Mobility   Bed Mobility  Supine to Sit;Sit to Supine    Supine to Sit  Minimal Assistance - Patient > 75%    Sit to Supine  Minimal Assistance - Patient > 75%   used leg lifter to help manage LEs with bed mobility     Transfers   Transfers  Sit to Stand;Stand to Sit    Sit to Stand  5: Supervision    Stand to Sit  5: Supervision    Comments  Worked on sit<>stand x 5 reps      Ambulation/Gait   Ambulation/Gait  Yes    Ambulation/Gait Assistance  4: Min assist    Ambulation/Gait Assistance Details  The patient is not wearing her foot up braces today due to oversleeping.  She has bilateral foot drag, and cannot advance the LEs at times (PT assists her by weight shifting and facilitating hip flexion).      Ambulation Distance (Feet)  50 Feet   x 2 reps   Assistive device  Rollator    Gait Pattern  Step-to pattern;Step-through pattern;Decreased step length - right;Decreased step length - left;Decreased stance time - left;Decreased stride length;Decreased hip/knee flexion - right;Decreased hip/knee flexion -  left;Decreased dorsiflexion - right;Decreased dorsiflexion - left;Right genu recurvatum;Left genu recurvatum;Trunk flexed;Narrow base of support;Poor foot clearance - left;Poor foot clearance - right    Ambulation Surface  Level;Indoor      Self-Care   Self-Care  Other Self-Care Comments    Other Self-Care Comments   PT and patient began discussing equipment to compensate for weakness.  We discussed need to trial AFOs for dorsiflexion assist + knee recurvatum / knee stability.  We discussed need for a wheelchair.  Initially, PT thought about transport chair, however she has mentioned her husband has dementia.  Therefore, we should consider lighthweight manual wheelchair versus power chair.  We discussed significantly slowed gait speed and how a wheelchair would aide her in being able to get out of the house quickly in case of an emergency.  Patient wants to think about these options.        Therapeutic Activites    Therapeutic Activities  Other Therapeutic Activities    Other Therapeutic Activities  Trialed leg lifter for bed mobility with improved ability to maneuver LEs.        Exercises   Exercises  Other Exercises    Other Exercises   Patient reported she cannot do supine exercises because she is so fatigued at end of day when she gets into bed.  We worked on seated hip adductor stretch and reviewed seated toe stretch.  *Plan to update HEP with seated/ standing activities per her request.                PT Short Term Goals - 08/23/18 1757      PT SHORT TERM GOAL #1   Title  The patient will return demo HEP for LE strengthening, stretching, and general balance/mobility.    Time  4    Period  Weeks    Target Date  09/09/18      PT SHORT TERM GOAL #2   Title  Perform Berg balance test with LTG to follow.     Baseline  25/56    Time  4    Period  Weeks    Status  Achieved    Target Date  09/09/18      PT SHORT TERM GOAL #3   Title  The patient will improve gait speed from 0.11 ft/sec to > or equal to 0.3 ft/sec to demo improving mobility.    Time  4    Period  Weeks    Target Date  09/09/18      PT SHORT TERM GOAL #4   Title  The patient will move from supine>sitting mod indep.    Time  4    Period  Weeks    Target Date  09/09/18      PT SHORT TERM GOAL #5   Title  Reduce low back pain to < or equal to 2/10 (from 5/10).    Time  4    Period  Weeks    Target Date  09/09/18        PT Long Term Goals - 08/23/18 1757      PT LONG TERM GOAL #1   Title  The patient will return demo progression of HEP for post d/c routine + return to pool program (LTG due on 10/09/2018)    Time  8    Period  Weeks      PT LONG TERM GOAL #2   Title  The patient will improve gait speed from 0.11 ft/sec to > or equal  to 0.5  ft/sec to demo improving household ambulation/ gait efficiency.    Time  8    Period  Weeks      PT LONG TERM GOAL #3   Title  The patient  will ambulate >300 ft nonstop with rollator RW mod indep for limited community mobility (walking into/out of appointments).      Time  8    Period  Weeks      PT LONG TERM GOAL #4   Title  The patient will move sit<>stand 5/5 trial with UE support without requiring multiple attempts.     Time  8    Period  Weeks      PT LONG TERM GOAL #5   Title  The patient will move from sitting>supine mod indep for bed mobility.    Time  8    Period  Weeks      PT LONG TERM GOAL #6   Title  Pt will decrease falls risk as indicated by improvement in BERG balance score to >/= 30/56    Baseline  25/56    Time  8    Period  Weeks    Status  New            Plan - 08/27/18 1306    Clinical Impression Statement  The patient is limited in her HEP performance b/c she is fatigued from daily activities.  We discussed need for equipment to help her conserve energy and introduced the idea of a manual or power w/c (exclused transport chair due to husband's cognition per patient report).  We also discussed need for AFOs.  PT to further progress HEP and address need for equipment to make daily tasks more manageable.    PT Treatment/Interventions  ADLs/Self Care Home Management;Neuromuscular re-education;Patient/family education;Stair training;Gait training;Functional mobility training;Therapeutic activities;Therapeutic exercise;Balance training;Wheelchair mobility training;Manual techniques;Orthotic Fit/Training;Aquatic Therapy;Passive range of motion;Energy conservation;Electrical Stimulation    PT Next Visit Plan  continue to adjust HEP focusing on hamstring strength, glute strength, hip flexor strength, stretching hip flexor mm.  Needs a lot of work on bed mobility.    She would like to try Bioness but e-stim would need to be added to POC.  She is not open to other AFO options at this time, continue to educate and possibly trial other AFO.    Consulted and Agree with Plan of Care  Patient       Patient will  benefit from skilled therapeutic intervention in order to improve the following deficits and impairments:  Abnormal gait, Impaired tone, Decreased activity tolerance, Decreased strength, Pain, Decreased balance, Decreased mobility, Difficulty walking, Impaired flexibility, Postural dysfunction  Visit Diagnosis: 1. Other abnormalities of gait and mobility   2. Muscle weakness (generalized)   3. Muscle hypertonicity   4. Foot drop, left   5. Foot drop, right        Problem List Patient Active Problem List   Diagnosis Date Noted  . Quadriplegia, unspecified ([place]) 12/24/2016  . Spastic paresis 09/11/2016  . Multiple sclerosis ([place]) 09/11/2016  . Muscle spasticity 10/31/2013  . Nerve root pain 01/31/2013  . Cervical myelopathy ([place]) 05/05/2011  . BP (high blood pressure) 05/05/2011  . Restless leg 05/05/2011  . Arthralgia, sacroiliac 05/05/2011    Riddick Nuon , PT 08/27/2018, 1:08 PM  [facility] [address] [place], [place], [address] Phone: [phone]   Fax:  [phone]  Name: Amy Bray MRN: [id number] Date of Birth: 08-06-1945

## 2018-08-31 ENCOUNTER — Other Ambulatory Visit: Payer: Self-pay

## 2018-08-31 ENCOUNTER — Ambulatory Visit: Payer: Medicare Other | Admitting: Physical Therapy

## 2018-08-31 DIAGNOSIS — M6281 Muscle weakness (generalized): Secondary | ICD-10-CM

## 2018-08-31 DIAGNOSIS — R2689 Other abnormalities of gait and mobility: Secondary | ICD-10-CM

## 2018-09-01 ENCOUNTER — Encounter: Payer: Self-pay | Admitting: Physical Therapy

## 2018-09-01 ENCOUNTER — Ambulatory Visit: Payer: Medicare Other | Admitting: Physical Therapy

## 2018-09-01 DIAGNOSIS — R2689 Other abnormalities of gait and mobility: Secondary | ICD-10-CM | POA: Diagnosis not present

## 2018-09-01 DIAGNOSIS — M6281 Muscle weakness (generalized): Secondary | ICD-10-CM

## 2018-09-01 NOTE — Therapy (Signed)
[facility] [address] [place], [place], [address] Phone: [phone]   Fax:  [phone]  Physical Therapy Treatment  Patient Details  Name: Amy Bray MRN: [id number] Date of Birth: Mar 12, 1945 Referring Provider (PT): Bess Harvest, MD   Encounter Date: 08/31/2018  PT End of Session - 09/01/18 1843    Visit Number  6    Number of Visits  17    Date for PT Re-Evaluation  10/09/18    Authorization Type  Medicare and [place]- 10th visit progress note needed    PT Start Time  1603    PT Stop Time  1701    PT Time Calculation (min)  58 min    Activity Tolerance  Patient tolerated treatment well    Behavior During Therapy  [facility] for tasks assessed/performed       Past Medical History:  Diagnosis Date  . Cervical myelopathy ([place])   . Multiple sclerosis ([place])   . Osteoporosis 2018   hip    Past Surgical History:  Procedure Laterality Date  . CERVICAL FUSION  01/2001, 01/2004   C3-C6    There were no vitals filed for this visit.  Subjective Assessment - 08/31/18 1609    Subjective  Pt reports minimal pain in her back today - states injection last week really helped; pt reports she has been able to get in and out of bed much better (independently) for past 2 days    Patient is accompained by:  Family member   husand with her   Pertinent History  cervical myelopathy, MS    Patient Stated Goals  To improve gait and balance.  She also notes getting out of bed is also more difficult.    Currently in Pain?  No/denies    Pain Score  2     Pain Location  Coccyx    Pain Orientation  Medial    Pain Descriptors / Indicators  Aching    Pain Type  Chronic pain                       [facility] Adult PT Treatment/Exercise - 09/01/18 0001      Bed Mobility   Bed Mobility  Sit to Supine    Supine to Sit  Supervision/Verbal cueing    Sit to Supine  Minimal Assistance - Patient > 75%   with Lt sidelying  to sitting     Ambulation/Gait   Ambulation/Gait  Yes    Ambulation/Gait Assistance  4: Min guard    Ambulation/Gait Assistance Details  pt wearing foot up braces on each leg    Ambulation Distance (Feet)  75 Feet    Assistive device  Rollator    Gait Pattern  Step-to pattern;Step-through pattern;Decreased step length - right;Decreased step length - left;Decreased stance time - left;Decreased stride length;Decreased hip/knee flexion - right;Decreased hip/knee flexion - left;Decreased dorsiflexion - right;Decreased dorsiflexion - left;Right genu recurvatum;Left genu recurvatum;Trunk flexed;Narrow base of support;Poor foot clearance - left;Poor foot clearance - right    Ambulation Surface  Level;Indoor      Exercises   Exercises  Knee/Hip;Ankle      Knee/Hip Exercises: Stretches   Active Hamstring Stretch  Both;1 rep;30 seconds   runner's stretch   Gastroc Stretch  Both;1 rep;30 seconds   with 2" high block     Knee/Hip Exercises: Aerobic   Recumbent Bike  SciFIt level 1.5 x 5" with UE's and LE's  Knee/Hip Exercises: Seated   Hamstring Curl  Strengthening;Right;Left;1 set;10 reps   with red theraband     Knee/Hip Exercises: Supine   Heel Slides  AROM;Right;Left;1 set;10 reps    Bridges  Strengthening;Both;1 set;10 reps    Bridges with Clamshell  Strengthening;Both;1 set;5 reps    Straight Leg Raises  AROM;Right;Left;10 reps    Other Supine Knee/Hip Exercises  hip flexion RLE and LLE in hooklying position 10 reps    Other Supine Knee/Hip Exercises  hip extension control exercise RLE and LLE off side of mat - 10 reps each leg               PT Short Term Goals - 09/01/18 1849      PT SHORT TERM GOAL #1   Title  The patient will return demo HEP for LE strengthening, stretching, and general balance/mobility.    Time  4    Period  Weeks    Target Date  09/09/18      PT SHORT TERM GOAL #2   Title  Perform Berg balance test with LTG to follow.     Baseline  25/56     Time  4    Period  Weeks    Status  Achieved    Target Date  09/09/18      PT SHORT TERM GOAL #3   Title  The patient will improve gait speed from 0.11 ft/sec to > or equal to 0.3 ft/sec to demo improving mobility.    Time  4    Period  Weeks    Target Date  09/09/18      PT SHORT TERM GOAL #4   Title  The patient will move from supine>sitting mod indep.    Time  4    Period  Weeks    Target Date  09/09/18      PT SHORT TERM GOAL #5   Title  Reduce low back pain to < or equal to 2/10 (from 5/10).    Time  4    Period  Weeks    Target Date  09/09/18        PT Long Term Goals - 09/01/18 1849      PT LONG TERM GOAL #1   Title  The patient will return demo progression of HEP for post d/c routine + return to pool program (LTG due on 10/09/2018)    Time  8    Period  Weeks      PT LONG TERM GOAL #2   Title  The patient will improve gait speed from 0.11 ft/sec to > or equal to 0.5  ft/sec to demo improving household ambulation/ gait efficiency.    Time  8    Period  Weeks      PT LONG TERM GOAL #3   Title  The patient will ambulate >300 ft nonstop with rollator RW mod indep for limited community mobility (walking into/out of appointments).      Time  8    Period  Weeks      PT LONG TERM GOAL #4   Title  The patient will move sit<>stand 5/5 trial with UE support without requiring multiple attempts.     Time  8    Period  Weeks      PT LONG TERM GOAL #5   Title  The patient will move from sitting>supine mod indep for bed mobility.    Time  8    Period  Weeks  PT LONG TERM GOAL #6   Title  Pt will decrease falls risk as indicated by improvement in BERG balance score to >/= 30/56    Baseline  25/56    Time  8    Period  Weeks    Status  New            Plan - 09/01/18 1844    Clinical Impression Statement  Skilled session focused on LE strengthening and stretching exercises.  Pt able to flex  each hip in hooklying position; pt has more difficulty with Lt  sidelying to sit transfer than with Rt sidelying to sitting transfer.  Pt's gait speed slightly increased today with pt taking larger steps with use of rollator.  Adductor tone noted in seated position on SciFit as pt had difficulty keeping knees separated.    Rehab Potential  Good    PT Frequency  2x / week    PT Duration  8 weeks    PT Treatment/Interventions  ADLs/Self Care Home Management;Neuromuscular re-education;Patient/family education;Stair training;Gait training;Functional mobility training;Therapeutic activities;Therapeutic exercise;Balance training;Wheelchair mobility training;Manual techniques;Orthotic Fit/Training;Aquatic Therapy;Passive range of motion;Energy conservation;Electrical Stimulation    PT Next Visit Plan  continue to adjust HEP focusing on hamstring strength, glute strength, hip flexor strength, stretching hip flexor mm.  Needs a lot of work on bed mobility.    She would like to try Bioness but e-stim would need to be added to POC.  She is not open to other AFO options at this time, continue to educate and possibly trial other AFO.       Patient will benefit from skilled therapeutic intervention in order to improve the following deficits and impairments:     Visit Diagnosis: 1. Other abnormalities of gait and mobility   2. Muscle weakness (generalized)        Problem List Patient Active Problem List   Diagnosis Date Noted  . Quadriplegia, unspecified ([place]) 12/24/2016  . Spastic paresis 09/11/2016  . Multiple sclerosis ([place]) 09/11/2016  . Muscle spasticity 10/31/2013  . Nerve root pain 01/31/2013  . Cervical myelopathy ([place]) 05/05/2011  . BP (high blood pressure) 05/05/2011  . Restless leg 05/05/2011  . Arthralgia, sacroiliac 05/05/2011    DildayJenness Corner, PT 09/01/2018, 6:51 PM  [facility] [address] [place], [place], [address] Phone: [phone]   Fax:  [phone]  Name: Amy Bray MRN: [id number] Date of Birth: [birth year]

## 2018-09-02 ENCOUNTER — Ambulatory Visit: Payer: Medicare Other | Admitting: Rehabilitative and Restorative Service Providers"

## 2018-09-02 NOTE — Therapy (Signed)
[facility] [address] [place], [place], [address] Phone: [phone]   Fax:  [phone]  Physical Therapy Treatment  Patient Details  Name: Amy Bray MRN: [id number] Date of Birth: 26-Feb-1945 Referring Provider (PT): Bess Harvest, MD   Encounter Date: 09/01/2018  PT End of Session - 09/02/18 2202    Visit Number  7    Number of Visits  17    Date for PT Re-Evaluation  10/09/18    Authorization Type  Medicare and [place]- 10th visit progress note needed    PT Start Time  1300    PT Stop Time  1350    PT Time Calculation (min)  50 min    Equipment Utilized During Treatment  --   pool noodle. ankle cuffs   Activity Tolerance  Patient tolerated treatment well    Behavior During Therapy  [facility] for tasks assessed/performed       Past Medical History:  Diagnosis Date  . Cervical myelopathy ([place])   . Multiple sclerosis ([place])   . Osteoporosis 2018   hip    Past Surgical History:  Procedure Laterality Date  . CERVICAL FUSION  01/2001, 01/2004   C3-C6    There were no vitals filed for this visit.  Subjective Assessment - 09/02/18 2159    Subjective  Pt presents for aquatic therapy at [facility] - states her walking is better    Patient is accompained by:  Family member   husand with her   Pertinent History  cervical myelopathy, MS    Patient Stated Goals  To improve gait and balance.  She also notes getting out of bed is also more difficult.    Currently in Pain?  No/denies        Patient seen for aquatic therapy today.  Treatment took place in water 3.5-4 feet deep depending upon activity.  Pt entered and exited the pool via Step negotiation using bil. Hand rails with CGA to SBA.   Pt performed stretches for bil. LE's - runner's stretch, heel cord stretch, and hip adductor stretch 30 sec hold each stretch each leg  Pt performed water walking 39m length of pool - forwards, backwards and sideways with UE  support on pool noodle, progressing to amb. Without use of noodle, as balance improved;  cues to increase step length as able to do so Marching in place 15 reps; progressed to marching forwards, backwards with use of UE support on pool noodle  Pt performed hip flexion, extension, abduction, adduction and hip internal/external rotation with knee flexed at 90 degrees - 15 reps each direction each leg with use of buoyant ankle cuff for increased resistance with the eccentric contraction Knee flexion/extension 15 reps each leg with ankle cuff  Pt performed squats bil. LE's 10 reps - without UE support to facilitate improved dynamic standing balance  Pt performed closed chain hip extension with foot on noodle - pushing down toward floor - 7 reps on RLE due to c/o fatigue;  10 reps LLE   Pt reclined in supine position with use of noodle under arms - pt performed hip abdct./adduction 10 reps;  bicycling LE's 10 reps  Pt requires aquatic therapy for buoyancy of water for support and to be able to off load her body while minimizing fall risk; viscosity of water needed to provide resistance for  strengthening trunk and LE musculature; buoyancy of water needed for support and assist with exercises to achieve greater AROM in water than  able to be performed on land                                     PT Short Term Goals - 09/02/18 2206      PT SHORT TERM GOAL #1   Title  The patient will return demo HEP for LE strengthening, stretching, and general balance/mobility.    Time  4    Period  Weeks    Target Date  09/09/18      PT SHORT TERM GOAL #2   Title  Perform Berg balance test with LTG to follow.     Baseline  25/56    Time  4    Period  Weeks    Status  Achieved    Target Date  09/09/18      PT SHORT TERM GOAL #3   Title  The patient will improve gait speed from 0.11 ft/sec to > or equal to 0.3 ft/sec to demo improving mobility.    Time  4    Period  Weeks     Target Date  09/09/18      PT SHORT TERM GOAL #4   Title  The patient will move from supine>sitting mod indep.    Time  4    Period  Weeks    Target Date  09/09/18      PT SHORT TERM GOAL #5   Title  Reduce low back pain to < or equal to 2/10 (from 5/10).    Time  4    Period  Weeks    Target Date  09/09/18        PT Long Term Goals - 09/02/18 2207      PT LONG TERM GOAL #1   Title  The patient will return demo progression of HEP for post d/c routine + return to pool program (LTG due on 10/09/2018)    Time  8    Period  Weeks      PT LONG TERM GOAL #2   Title  The patient will improve gait speed from 0.11 ft/sec to > or equal to 0.5  ft/sec to demo improving household ambulation/ gait efficiency.    Time  8    Period  Weeks      PT LONG TERM GOAL #3   Title  The patient will ambulate >300 ft nonstop with rollator RW mod indep for limited community mobility (walking into/out of appointments).      Time  8    Period  Weeks      PT LONG TERM GOAL #4   Title  The patient will move sit<>stand 5/5 trial with UE support without requiring multiple attempts.     Time  8    Period  Weeks      PT LONG TERM GOAL #5   Title  The patient will move from sitting>supine mod indep for bed mobility.    Time  8    Period  Weeks      PT LONG TERM GOAL #6   Title  Pt will decrease falls risk as indicated by improvement in BERG balance score to >/= 30/56    Baseline  25/56    Time  8    Period  Weeks    Status  New            Plan - 09/02/18 2203    Clinical Impression  Statement  Pt tolerated aquatic exercises well - used ankle cuffs for increased resistance with eccentric exercises with good control and technique.  Pt able to maintain balance better with water walking with less UE support needed today than in previous sessions, demonstrating improvement in balance.    Rehab Potential  Good    PT Frequency  2x / week    PT Duration  8 weeks    PT Treatment/Interventions   ADLs/Self Care Home Management;Neuromuscular re-education;Patient/family education;Stair training;Gait training;Functional mobility training;Therapeutic activities;Therapeutic exercise;Balance training;Wheelchair mobility training;Manual techniques;Orthotic Fit/Training;Aquatic Therapy;Passive range of motion;Energy conservation;Electrical Stimulation    PT Next Visit Plan  continue to adjust HEP focusing on hamstring strength, glute strength, hip flexor strength, stretching hip flexor mm.  Needs a lot of work on bed mobility.    She would like to try Bioness but e-stim would need to be added to POC.  She is not open to other AFO options at this time, continue to educate and possibly trial other AFO.    PT Home Exercise Plan  Access Code: [email or web address]       Patient will benefit from skilled therapeutic intervention in order to improve the following deficits and impairments:     Visit Diagnosis: 1. Other abnormalities of gait and mobility   2. Muscle weakness (generalized)        Problem List Patient Active Problem List   Diagnosis Date Noted  . Quadriplegia, unspecified ([place]) 12/24/2016  . Spastic paresis 09/11/2016  . Multiple sclerosis ([place]) 09/11/2016  . Muscle spasticity 10/31/2013  . Nerve root pain 01/31/2013  . Cervical myelopathy ([place]) 05/05/2011  . BP (high blood pressure) 05/05/2011  . Restless leg 05/05/2011  . Arthralgia, sacroiliac 05/05/2011    Russel Morain, [place], PT, [facility] 09/02/2018, 10:08 PM  [facility] [address] [place], [place], [address] Phone: [phone]   Fax:  [phone]  Name: KINSLEIGH LUDOLPH MRN: [id number] Date of Birth: 09-09-1945

## 2018-09-06 ENCOUNTER — Ambulatory Visit: Payer: Medicare Other | Admitting: Physical Therapy

## 2018-09-06 ENCOUNTER — Other Ambulatory Visit: Payer: Self-pay

## 2018-09-06 DIAGNOSIS — R2689 Other abnormalities of gait and mobility: Secondary | ICD-10-CM | POA: Diagnosis not present

## 2018-09-06 DIAGNOSIS — M6281 Muscle weakness (generalized): Secondary | ICD-10-CM

## 2018-09-07 ENCOUNTER — Ambulatory Visit: Payer: Medicare Other | Admitting: Physical Therapy

## 2018-09-07 DIAGNOSIS — R2689 Other abnormalities of gait and mobility: Secondary | ICD-10-CM

## 2018-09-07 DIAGNOSIS — M6281 Muscle weakness (generalized): Secondary | ICD-10-CM

## 2018-09-07 NOTE — Therapy (Signed)
[facility] [address] [place], [place], [address] Phone: [phone]   Fax:  [phone]  Physical Therapy Treatment  Patient Details  Name: Amy Bray MRN: [id number] Date of Birth: [birth year] Referring Provider (PT): Bess Harvest, MD   Encounter Date: 09/06/2018  PT End of Session - 09/07/18 2211    Visit Number  8    Number of Visits  17    Date for PT Re-Evaluation  10/09/18    Authorization Type  Medicare and [place]- 10th visit progress note needed    PT Start Time  1550    PT Stop Time  1635    PT Time Calculation (min)  45 min    Equipment Utilized During Treatment  --   pool noodle. ankle cuffs   Activity Tolerance  Patient tolerated treatment well    Behavior During Therapy  [facility] for tasks assessed/performed       Past Medical History:  Diagnosis Date  . Cervical myelopathy ([place])   . Multiple sclerosis ([place])   . Osteoporosis 2018   hip    Past Surgical History:  Procedure Laterality Date  . CERVICAL FUSION  01/2001, 01/2004   C3-C6    There were no vitals filed for this visit.  Subjective Assessment - 09/07/18 2210    Subjective  Pt presents for aquatic therapy at [facility] - states she feels she is now getting stronger and seeing improvement with the aquatic therapy    Patient is accompained by:  Family member   husand with her   Pertinent History  cervical myelopathy, MS    Patient Stated Goals  To improve gait and balance.  She also notes getting out of bed is also more difficult.    Currently in Pain?  No/denies                                 PT Short Term Goals - 09/07/18 2213      PT SHORT TERM GOAL #1   Title  The patient will return demo HEP for LE strengthening, stretching, and general balance/mobility.    Time  4    Period  Weeks    Target Date  09/09/18      PT SHORT TERM GOAL #2   Title  Perform Berg balance test with LTG to follow.     Baseline  25/56    Time  4    Period  Weeks    Status  Achieved    Target Date  09/09/18      PT SHORT TERM GOAL #3   Title  The patient will improve gait speed from 0.11 ft/sec to > or equal to 0.3 ft/sec to demo improving mobility.    Time  4    Period  Weeks    Target Date  09/09/18      PT SHORT TERM GOAL #4   Title  The patient will move from supine>sitting mod indep.    Time  4    Period  Weeks    Target Date  09/09/18      PT SHORT TERM GOAL #5   Title  Reduce low back pain to < or equal to 2/10 (from 5/10).    Time  4    Period  Weeks    Target Date  09/09/18        PT Long Term Goals - 09/07/18 2214  PT LONG TERM GOAL #1   Title  The patient will return demo progression of HEP for post d/c routine + return to pool program (LTG due on 10/09/2018)    Time  8    Period  Weeks      PT LONG TERM GOAL #2   Title  The patient will improve gait speed from 0.11 ft/sec to > or equal to 0.5  ft/sec to demo improving household ambulation/ gait efficiency.    Time  8    Period  Weeks      PT LONG TERM GOAL #3   Title  The patient will ambulate >300 ft nonstop with rollator RW mod indep for limited community mobility (walking into/out of appointments).      Time  8    Period  Weeks      PT LONG TERM GOAL #4   Title  The patient will move sit<>stand 5/5 trial with UE support without requiring multiple attempts.     Time  8    Period  Weeks      PT LONG TERM GOAL #5   Title  The patient will move from sitting>supine mod indep for bed mobility.    Time  8    Period  Weeks      PT LONG TERM GOAL #6   Title  Pt will decrease falls risk as indicated by improvement in BERG balance score to >/= 30/56    Baseline  25/56    Time  8    Period  Weeks    Status  New              Patient will benefit from skilled therapeutic intervention in order to improve the following deficits and impairments:     Visit Diagnosis: 1. Other abnormalities of gait and mobility    2. Muscle weakness (generalized)        Problem List Patient Active Problem List   Diagnosis Date Noted  . Quadriplegia, unspecified ([place]) 12/24/2016  . Spastic paresis 09/11/2016  . Multiple sclerosis ([place]) 09/11/2016  . Muscle spasticity 10/31/2013  . Nerve root pain 01/31/2013  . Cervical myelopathy ([place]) 05/05/2011  . BP (high blood pressure) 05/05/2011  . Restless leg 05/05/2011  . Arthralgia, sacroiliac 05/05/2011    Jannessa Ogden, [place], PT, [place] 09/07/2018, 10:15 PM  [facility] [address] [place], [place], [address] Phone: [phone]   Fax:  [phone]  Name: Amy Bray MRN: [id number] Date of Birth: Jun 05, 1945

## 2018-09-08 ENCOUNTER — Encounter: Payer: Self-pay | Admitting: Physical Therapy

## 2018-09-08 NOTE — Therapy (Signed)
[facility] [address] [place], [place], [address] Phone: [phone]   Fax:  [phone]  Physical Therapy Treatment  Patient Details  Name: Amy Bray MRN: [id number] Date of Birth: [birth year] Referring Provider (PT): Bess Harvest, MD   Encounter Date: 09/07/2018  PT End of Session - 09/08/18 1951    Visit Number  9    Number of Visits  17    Date for PT Re-Evaluation  10/09/18    Authorization Type  Medicare and [place]- 10th visit progress note needed    PT Start Time  1402    PT Stop Time  1449    PT Time Calculation (min)  47 min    Equipment Utilized During Treatment  Gait belt    Activity Tolerance  Patient tolerated treatment well    Behavior During Therapy  [facility] for tasks assessed/performed       Past Medical History:  Diagnosis Date  . Cervical myelopathy ([place])   . Multiple sclerosis ([place])   . Osteoporosis 2018   hip    Past Surgical History:  Procedure Laterality Date  . CERVICAL FUSION  01/2001, 01/2004   C3-C6    There were no vitals filed for this visit.  Subjective Assessment - 09/08/18 1945    Subjective  Pt reports no issues or problems - states she feels she is getting stronger    Patient is accompained by:  Family member   husand with her   Pertinent History  cervical myelopathy, MS    Patient Stated Goals  To improve gait and balance.  She also notes getting out of bed is also more difficult.    Currently in Pain?  No/denies                       [facility] Adult PT Treatment/Exercise - 09/08/18 0001      Bed Mobility   Bed Mobility  Sit to Supine    Supine to Sit  Supervision/Verbal cueing    Sit to Supine  Minimal Assistance - Patient > 75%   with Lt sidelying to sitting     Ambulation/Gait   Ambulation/Gait  Yes    Ambulation/Gait Assistance  5: Supervision    Ambulation/Gait Assistance Details  foot up braces donned on each LE    Ambulation Distance  (Feet)  50 Feet    Assistive device  Rollator    Gait Pattern  Step-to pattern;Step-through pattern;Decreased step length - right;Decreased step length - left;Decreased stance time - left;Decreased stride length;Decreased hip/knee flexion - right;Decreased hip/knee flexion - left;Decreased dorsiflexion - right;Decreased dorsiflexion - left;Right genu recurvatum;Left genu recurvatum;Trunk flexed;Narrow base of support;Poor foot clearance - left;Poor foot clearance - right    Ambulation Surface  Level;Indoor      Knee/Hip Exercises: Aerobic   Recumbent Bike  SciFIt level 1.5 x 5" with UE's and LE's      Knee/Hip Exercises: Standing   Heel Raises  Both;1 set;10 reps    Forward Step Up  Right;Left;1 set;10 reps;Hand Hold: 2;Step Height: 6"    Other Standing Knee Exercises  tap ups to 1st step with UE support on rails with CGA to min assist      Knee/Hip Exercises: Supine   Heel Slides  AROM;Right;Left;1 set;10 reps    Bridges  Strengthening;Both;1 set;10 reps    Bridges with Clamshell  Strengthening;Both;1 set;5 reps    Other Supine Knee/Hip Exercises  pt performed bridging with LE extension  with mod assist 10 reps; bridging with marching 10 reps     Other Supine Knee/Hip Exercises  hip extension control exercise RLE and LLE off side of mat - 10 reps each leg      Tall kneeling position on mat - UE support on green ball; pt performed squats back onto heels 10 reps   Moved each leg out/in 5 reps for balance and LE stabilization with min assist for RLE, mod assist for LLE        PT Short Term Goals - 09/08/18 1956      PT SHORT TERM GOAL #1   Title  The patient will return demo HEP for LE strengthening, stretching, and general balance/mobility.    Time  4    Period  Weeks    Target Date  09/09/18      PT SHORT TERM GOAL #2   Title  Perform Berg balance test with LTG to follow.     Baseline  25/56    Time  4    Period  Weeks    Status  Achieved    Target Date  09/09/18      PT  SHORT TERM GOAL #3   Title  The patient will improve gait speed from 0.11 ft/sec to > or equal to 0.3 ft/sec to demo improving mobility.    Time  4    Period  Weeks    Target Date  09/09/18      PT SHORT TERM GOAL #4   Title  The patient will move from supine>sitting mod indep.    Time  4    Period  Weeks    Target Date  09/09/18      PT SHORT TERM GOAL #5   Title  Reduce low back pain to < or equal to 2/10 (from 5/10).    Time  4    Period  Weeks    Target Date  09/09/18        PT Long Term Goals - 09/08/18 1956      PT LONG TERM GOAL #1   Title  The patient will return demo progression of HEP for post d/c routine + return to pool program (LTG due on 10/09/2018)    Time  8    Period  Weeks      PT LONG TERM GOAL #2   Title  The patient will improve gait speed from 0.11 ft/sec to > or equal to 0.5  ft/sec to demo improving household ambulation/ gait efficiency.    Time  8    Period  Weeks      PT LONG TERM GOAL #3   Title  The patient will ambulate >300 ft nonstop with rollator RW mod indep for limited community mobility (walking into/out of appointments).      Time  8    Period  Weeks      PT LONG TERM GOAL #4   Title  The patient will move sit<>stand 5/5 trial with UE support without requiring multiple attempts.     Time  8    Period  Weeks      PT LONG TERM GOAL #5   Title  The patient will move from sitting>supine mod indep for bed mobility.    Time  8    Period  Weeks      PT LONG TERM GOAL #6   Title  Pt will decrease falls risk as indicated by improvement in BERG balance score to >/=  30/56    Baseline  25/56    Time  8    Period  Weeks    Status  New            Plan - 09/08/18 1952    Clinical Impression Statement  Pt improving with increasing LE strength; pt almost able to transfer LE's onto mat for seated to supine position, with pt needing min assist to fully transfer RLE onto mat table; pt continues to need min assist to transfer from Lt  sidelying to sitting position    Rehab Potential  Good    PT Frequency  2x / week    PT Duration  8 weeks    PT Treatment/Interventions  ADLs/Self Care Home Management;Neuromuscular re-education;Patient/family education;Stair training;Gait training;Functional mobility training;Therapeutic activities;Therapeutic exercise;Balance training;Wheelchair mobility training;Manual techniques;Orthotic Fit/Training;Aquatic Therapy;Passive range of motion;Energy conservation;Electrical Stimulation    PT Next Visit Plan  cont LE strengthening and gait training - 10th visit progress note due next session - check STG's at next land appt.    PT Home Exercise Plan  Access Code: [email or web address]       Patient will benefit from skilled therapeutic intervention in order to improve the following deficits and impairments:     Visit Diagnosis: 1. Other abnormalities of gait and mobility   2. Muscle weakness (generalized)        Problem List Patient Active Problem List   Diagnosis Date Noted  . Quadriplegia, unspecified ([place]) 12/24/2016  . Spastic paresis 09/11/2016  . Multiple sclerosis ([place]) 09/11/2016  . Muscle spasticity 10/31/2013  . Nerve root pain 01/31/2013  . Cervical myelopathy ([place]) 05/05/2011  . BP (high blood pressure) 05/05/2011  . Restless leg 05/05/2011  . Arthralgia, sacroiliac 05/05/2011    DildayJenness Corner, PT 09/08/2018, 7:58 PM  [facility] [address] [place], [place], [address] Phone: [phone]   Fax:  [phone]  Name: Amy Bray MRN: [id number] Date of Birth: 06/28/1945

## 2018-09-09 ENCOUNTER — Ambulatory Visit: Payer: Medicare Other | Admitting: Physical Therapy

## 2018-09-10 ENCOUNTER — Ambulatory Visit: Payer: Medicare Other | Admitting: Physical Therapy

## 2018-09-12 ENCOUNTER — Other Ambulatory Visit: Payer: Self-pay | Admitting: Diagnostic Neuroimaging

## 2018-09-14 ENCOUNTER — Telehealth: Payer: Self-pay | Admitting: *Deleted

## 2018-09-14 ENCOUNTER — Ambulatory Visit: Payer: Medicare Other | Admitting: Physical Therapy

## 2018-09-14 ENCOUNTER — Other Ambulatory Visit: Payer: Self-pay

## 2018-09-14 DIAGNOSIS — R2689 Other abnormalities of gait and mobility: Secondary | ICD-10-CM

## 2018-09-14 DIAGNOSIS — M6281 Muscle weakness (generalized): Secondary | ICD-10-CM

## 2018-09-14 NOTE — Telephone Encounter (Signed)
[facility]ys[facility]ri Pt status report and reauth questionnaire completed, signed and faxed back to [facility].

## 2018-09-14 NOTE — Telephone Encounter (Signed)
Received fax from [place] re: Tysabri authorized to be received at [facility] valid from 09/14/2018 through 04/13/2019. Auth letter given to [employer], infusion suite.

## 2018-09-15 ENCOUNTER — Ambulatory Visit: Payer: Medicare Other | Admitting: Physical Therapy

## 2018-09-15 DIAGNOSIS — R2689 Other abnormalities of gait and mobility: Secondary | ICD-10-CM | POA: Diagnosis not present

## 2018-09-15 DIAGNOSIS — M6281 Muscle weakness (generalized): Secondary | ICD-10-CM

## 2018-09-15 NOTE — Therapy (Signed)
[facility] [address] [place], [place], [address] Phone: [phone]   Fax:  [phone]  Physical Therapy Treatment and 10th Visit Progress Note   Reporting Period for 10th visit progress note:  08-10-18 - 09-14-18 Progress towards short term goals - see below   Patient Details  Name: Amy Bray MRN: [id number] Date of Birth: [birth year] Referring Provider (PT): Bess Harvest, MD   Encounter Date: 09/14/2018  PT End of Session - 09/15/18 2047    Visit Number  10    Number of Visits  17    Date for PT Re-Evaluation  10/09/18    Authorization Type  Medicare and [place]- 10th visit progress note needed    PT Start Time  1401    PT Stop Time  1448    PT Time Calculation (min)  47 min    Equipment Utilized During Treatment  Gait belt    Activity Tolerance  Patient tolerated treatment well    Behavior During Therapy  WFL for tasks assessed/performed       Past Medical History:  Diagnosis Date  . Cervical myelopathy ([place])   . Multiple sclerosis ([place])   . Osteoporosis 2018   hip    Past Surgical History:  Procedure Laterality Date  . CERVICAL FUSION  01/2001, 01/2004   C3-C6    There were no vitals filed for this visit.  Subjective Assessment - 09/15/18 2044    Subjective  Pt reports no issues or problems - states she is able to take a little bigger step    Patient is accompained by:  Family member   husand with her   Pertinent History  cervical myelopathy, MS    Patient Stated Goals  To improve gait and balance.  She also notes getting out of bed is also more difficult.    Currently in Pain?  No/denies                       [facility] Adult PT Treatment/Exercise - 09/15/18 0001      Bed Mobility   Bed Mobility  Sit to Supine    Supine to Sit  Supervision/Verbal cueing    Sit to Supine  Supervision/Verbal cueing      Ambulation/Gait   Ambulation/Gait  Yes    Ambulation/Gait  Assistance  5: Supervision    Ambulation/Gait Assistance Details  pt not wearing foot up braces today    Ambulation Distance (Feet)  75 Feet    Assistive device  Rollator    Gait Pattern  Step-to pattern;Step-through pattern;Decreased step length - right;Decreased step length - left;Decreased stance time - left;Decreased stride length;Decreased hip/knee flexion - right;Decreased hip/knee flexion - left;Decreased dorsiflexion - right;Decreased dorsiflexion - left;Right genu recurvatum;Left genu recurvatum;Trunk flexed;Narrow base of support;Poor foot clearance - left;Poor foot clearance - right    Ambulation Surface  Level;Indoor      Knee/Hip Exercises: Aerobic   Recumbent Bike  SciFIt level 2.0 x 5" with UE's and LE's      Knee/Hip Exercises: Seated   Hamstring Curl  Strengthening;Right;Left;1 set;10 reps   with red theraband     Knee/Hip Exercises: Supine   Heel Slides  AROM;Right;Left;1 set;10 reps    Bridges  Strengthening;Both;1 set;10 reps    Bridges with Clamshell  Strengthening;Both;1 set;5 reps    Other Supine Knee/Hip Exercises  pt performed bridging with LE extension with mod assist 10 reps; bridging with marching 10 reps  Other Supine Knee/Hip Exercises  hip extension control exercise RLE and LLE off side of mat - 10 reps each leg      Knee/Hip Exercises: Sidelying   Hip ABduction  AROM;Right;Left;1 set;10 reps    Clams  RLE and LLE 10 reps each leg               PT Short Term Goals - 09/15/18 2051      PT SHORT TERM GOAL #1   Title  The patient will return demo HEP for LE strengthening, stretching, and general balance/mobility.    Baseline  met 09-14-18    Time  4    Period  Weeks    Status  Achieved    Target Date  09/09/18      PT SHORT TERM GOAL #2   Title  Perform Berg balance test with LTG to follow.     Baseline  25/56    Time  4    Period  Weeks    Status  Achieved    Target Date  09/09/18      PT SHORT TERM GOAL #3   Title  The patient will  improve gait speed from 0.11 ft/sec to > or equal to 0.3 ft/sec to demo improving mobility.    Baseline  to be assessed next session -not tested due to time constraint    Time  4    Period  Weeks    Status  On-going    Target Date  09/09/18      PT SHORT TERM GOAL #4   Title  The patient will move from supine>sitting mod indep.    Baseline  met 09-14-18 from right side; needs min assist from left sidelying to sitting    Time  4    Period  Weeks    Status  Partially Met    Target Date  09/09/18      PT SHORT TERM GOAL #5   Title  Reduce low back pain to < or equal to 2/10 (from 5/10).    Baseline  met 09-14-18    Time  4    Period  Weeks    Status  Achieved    Target Date  09/09/18        PT Long Term Goals - 09/08/18 1956      PT LONG TERM GOAL #1   Title  The patient will return demo progression of HEP for post d/c routine + return to pool program (LTG due on 10/09/2018)    Time  8    Period  Weeks      PT LONG TERM GOAL #2   Title  The patient will improve gait speed from 0.11 ft/sec to > or equal to 0.5  ft/sec to demo improving household ambulation/ gait efficiency.    Time  8    Period  Weeks      PT LONG TERM GOAL #3   Title  The patient will ambulate >300 ft nonstop with rollator RW mod indep for limited community mobility (walking into/out of appointments).      Time  8    Period  Weeks      PT LONG TERM GOAL #4   Title  The patient will move sit<>stand 5/5 trial with UE support without requiring multiple attempts.     Time  8    Period  Weeks      PT LONG TERM GOAL #5   Title  The patient will  move from sitting>supine mod indep for bed mobility.    Time  8    Period  Weeks      PT LONG TERM GOAL #6   Title  Pt will decrease falls risk as indicated by improvement in BERG balance score to >/= 30/56    Baseline  25/56    Time  8    Period  Weeks    Status  New            Plan - 09/15/18 2056    Clinical Impression Statement  This 10th visit  progress note covers dates 08-10-18 - 09-14-18:  Pt has met STG's #1, 2 and 5 with goal #4 partially met as pt is able to transfer from Rt sidelying to sitting independently but needs min assist to transfer Lt sidelying to sitting position.  Gait velocity to be reassesed next session due to time constraint and pt not wearing Foot up orthoses at today's session.  Pt is progressing well with balance and gait.    Rehab Potential  Good    PT Frequency  2x / week    PT Duration  8 weeks    PT Treatment/Interventions  ADLs/Self Care Home Management;Neuromuscular re-education;Patient/family education;Stair training;Gait training;Functional mobility training;Therapeutic activities;Therapeutic exercise;Balance training;Wheelchair mobility training;Manual techniques;Orthotic Fit/Training;Aquatic Therapy;Passive range of motion;Energy conservation;Electrical Stimulation    PT Next Visit Plan  cont LE strengthening and gait training    PT Home Exercise Plan  Access Code: [email or web address]       Patient will benefit from skilled therapeutic intervention in order to improve the following deficits and impairments:     Visit Diagnosis: Other abnormalities of gait and mobility  Muscle weakness (generalized)     Problem List Patient Active Problem List   Diagnosis Date Noted  . Quadriplegia, unspecified ([place]) 12/24/2016  . Spastic paresis 09/11/2016  . Multiple sclerosis ([place]) 09/11/2016  . Muscle spasticity 10/31/2013  . Nerve root pain 01/31/2013  . Cervical myelopathy ([place]) 05/05/2011  . BP (high blood pressure) 05/05/2011  . Restless leg 05/05/2011  . Arthralgia, sacroiliac 05/05/2011    DildayJenness Corner, PT 09/15/2018, 9:14 PM  [facility] [address] [place], [place], [address] Phone: [phone]   Fax:  [phone]  Name: Amy Bray MRN: [id number] Date of Birth: 03-Sep-1945

## 2018-09-16 ENCOUNTER — Ambulatory Visit: Payer: Medicare Other | Admitting: Rehabilitative and Restorative Service Providers"

## 2018-09-16 NOTE — Therapy (Signed)
[facility] [address] [place], [place], [address] Phone: [phone]   Fax:  [phone]  Physical Therapy Treatment  Patient Details  Name: Amy Bray MRN: [id number] Date of Birth: May 17, 1945 Referring Provider (PT): Bess Harvest, MD   Encounter Date: 09/15/2018  PT End of Session - 09/16/18 1923    Visit Number  11    Number of Visits  17    Date for PT Re-Evaluation  10/09/18    Authorization Type  Medicare and [place]- 10th visit progress note needed    PT Start Time  1330    PT Stop Time  1415    PT Time Calculation (min)  45 min    Equipment Utilized During Treatment  Gait belt   pool noodle and aquatic cuffs   Activity Tolerance  Patient tolerated treatment well    Behavior During Therapy  [facility] for tasks assessed/performed       Past Medical History:  Diagnosis Date  . Cervical myelopathy ([place])   . Multiple sclerosis ([place])   . Osteoporosis 2018   hip    Past Surgical History:  Procedure Laterality Date  . CERVICAL FUSION  01/2001, 01/2004   C3-C6    There were no vitals filed for this visit.  Subjective Assessment - 09/16/18 1920    Subjective  Pt states she is able to take bigger steps - feels she is getting stronger and balance is improving    Patient is accompained by:  Family member   husand with her   Pertinent History  cervical myelopathy, MS    Patient Stated Goals  To improve gait and balance.  She also notes getting out of bed is also more difficult.    Currently in Pain?  No/denies         Patient seen for aquatic therapy today.  Treatment took place in water 3.5-4 feet deep depending upon activity.  Pt entered and exited the pool via Step negotiation using bil. Hand rails with CGA to SBA.   Pt performed stretches for bil. LE's - runner's stretch, heel cord stretch, and hip adductor stretch 30 sec hold each stretch each leg  Pt performed water walking 53mlength of pool  - forwards, backwards and sideways  amb. ;  Noodle used for UE support with backwards amb.;  cues to increase step length as able to do so Marching in place 15 reps; progressed to marching forwards, backwards with use of UE support on pool noodle  Pt performed hip flexion, extension, abduction, adduction and hip internal/external rotation with knee flexed at 90 degrees - 15 reps each direction each leg with use of buoyant ankle cuff for increased resistance with the eccentric contraction Knee flexion/extension 15 reps each leg with ankle cuff  Pt performed squats bil. LE's 10 reps - without UE support to facilitate improved dynamic standing balance   Pt reclined in supine position with use of noodle under arms - pt performed hip abdct./adduction 10 reps;  bicycling LE's 10 reps  Pt requires aquatic therapy for buoyancy of water for support and to be able to off load her body while minimizing fall risk; viscosity of water needed to provide resistance for  strengthening trunk and LE musculature; buoyancy of water needed for support and assist with exercises to achieve greater AROM in water than able to be performed on land  PT Short Term Goals - 09/16/18 1928      PT SHORT TERM GOAL #1   Title  The patient will return demo HEP for LE strengthening, stretching, and general balance/mobility.    Baseline  met 09-14-18    Time  4    Period  Weeks    Status  Achieved    Target Date  09/09/18      PT SHORT TERM GOAL #2   Title  Perform Berg balance test with LTG to follow.     Baseline  25/56    Time  4    Period  Weeks    Status  Achieved    Target Date  09/09/18      PT SHORT TERM GOAL #3   Title  The patient will improve gait speed from 0.11 ft/sec to > or equal to 0.3 ft/sec to demo improving mobility.    Baseline  to be assessed next session -not tested due to time constraint    Time  4    Period  Weeks    Status  On-going    Target  Date  09/09/18      PT SHORT TERM GOAL #4   Title  The patient will move from supine>sitting mod indep.    Baseline  met 09-14-18 from right side; needs min assist from left sidelying to sitting    Time  4    Period  Weeks    Status  Partially Met    Target Date  09/09/18      PT SHORT TERM GOAL #5   Title  Reduce low back pain to < or equal to 2/10 (from 5/10).    Baseline  met 09-14-18    Time  4    Period  Weeks    Status  Achieved    Target Date  09/09/18        PT Long Term Goals - 09/16/18 1929      PT LONG TERM GOAL #1   Title  The patient will return demo progression of HEP for post d/c routine + return to pool program (LTG due on 10/09/2018)    Time  8    Period  Weeks      PT LONG TERM GOAL #2   Title  The patient will improve gait speed from 0.11 ft/sec to > or equal to 0.5  ft/sec to demo improving household ambulation/ gait efficiency.    Time  8    Period  Weeks      PT LONG TERM GOAL #3   Title  The patient will ambulate >300 ft nonstop with rollator RW mod indep for limited community mobility (walking into/out of appointments).      Time  8    Period  Weeks      PT LONG TERM GOAL #4   Title  The patient will move sit<>stand 5/5 trial with UE support without requiring multiple attempts.     Time  8    Period  Weeks      PT LONG TERM GOAL #5   Title  The patient will move from sitting>supine mod indep for bed mobility.    Time  8    Period  Weeks      PT LONG TERM GOAL #6   Title  Pt will decrease falls risk as indicated by improvement in BERG balance score to >/= 30/56    Baseline  25/56    Time  8  Period  Weeks    Status  New            Plan - 09/16/18 1924    Clinical Impression Statement  Pt demonstrates much improvement in gait and balance with aquatic therapy - pt now able to amb. in pool with minimal UE support, demonstrating improved balance    Rehab Potential  Good    PT Frequency  2x / week    PT Duration  8 weeks    PT  Treatment/Interventions  ADLs/Self Care Home Management;Neuromuscular re-education;Patient/family education;Stair training;Gait training;Functional mobility training;Therapeutic activities;Therapeutic exercise;Balance training;Wheelchair mobility training;Manual techniques;Orthotic Fit/Training;Aquatic Therapy;Passive range of motion;Energy conservation;Electrical Stimulation    PT Next Visit Plan  cont LE strengthening and gait training    PT Home Exercise Plan  Access Code: [email or web address]       Patient will benefit from skilled therapeutic intervention in order to improve the following deficits and impairments:     Visit Diagnosis: Other abnormalities of gait and mobility  Muscle weakness (generalized)     Problem List Patient Active Problem List   Diagnosis Date Noted  . Quadriplegia, unspecified ([place]) 12/24/2016  . Spastic paresis 09/11/2016  . Multiple sclerosis ([place]) 09/11/2016  . Muscle spasticity 10/31/2013  . Nerve root pain 01/31/2013  . Cervical myelopathy ([place]) 05/05/2011  . BP (high blood pressure) 05/05/2011  . Restless leg 05/05/2011  . Arthralgia, sacroiliac 05/05/2011    Adria Costley, Jenness Corner, PT, [facility] 09/16/2018, 7:32 PM  [facility] [address] [place], [place], [address] Phone: [phone]   Fax:  [phone]  Name: Amy Bray MRN: [id number] Date of Birth: 12/09/1945

## 2018-09-20 ENCOUNTER — Other Ambulatory Visit: Payer: Self-pay | Admitting: Family Medicine

## 2018-09-20 DIAGNOSIS — Z1231 Encounter for screening mammogram for malignant neoplasm of breast: Secondary | ICD-10-CM

## 2018-09-22 ENCOUNTER — Ambulatory Visit: Payer: Medicare Other | Attending: Diagnostic Neuroimaging | Admitting: Physical Therapy

## 2018-09-22 ENCOUNTER — Other Ambulatory Visit: Payer: Self-pay

## 2018-09-22 DIAGNOSIS — M6289 Other specified disorders of muscle: Secondary | ICD-10-CM | POA: Diagnosis present

## 2018-09-22 DIAGNOSIS — M21371 Foot drop, right foot: Secondary | ICD-10-CM | POA: Insufficient documentation

## 2018-09-22 DIAGNOSIS — M21372 Foot drop, left foot: Secondary | ICD-10-CM | POA: Diagnosis present

## 2018-09-22 DIAGNOSIS — R2689 Other abnormalities of gait and mobility: Secondary | ICD-10-CM | POA: Diagnosis not present

## 2018-09-22 DIAGNOSIS — M6281 Muscle weakness (generalized): Secondary | ICD-10-CM | POA: Insufficient documentation

## 2018-09-23 NOTE — Therapy (Signed)
[facility] [address] [place], [place], [address] Phone: [phone]   Fax:  [phone]  Physical Therapy Treatment  Patient Details  Name: Amy Bray MRN: [id number] Date of Birth: [birth year] Referring Provider (PT): Bess Harvest, MD   Encounter Date: 09/22/2018  PT End of Session - 09/23/18 2159    Visit Number  12    Number of Visits  17    Date for PT Re-Evaluation  10/09/18    Authorization Type  Medicare and [employer]- 10th visit progress note needed    PT Start Time  1330    PT Stop Time  1415    PT Time Calculation (min)  45 min    Equipment Utilized During Treatment  Gait belt   pool noodle and aquatic cuffs   Activity Tolerance  Patient tolerated treatment well    Behavior During Therapy  [facility] for tasks assessed/performed       Past Medical History:  Diagnosis Date  . Cervical myelopathy ([place])   . Multiple sclerosis ([place])   . Osteoporosis 2018   hip    Past Surgical History:  Procedure Laterality Date  . CERVICAL FUSION  01/2001, 01/2004   C3-C6    There were no vitals filed for this visit.  Subjective Assessment - 09/23/18 2157    Subjective  Pt is in pool doing some exercises at the side - says she arrived about 15" early and decided to get in and exercise    Patient is accompained by:  Family member   husand with her   Pertinent History  cervical myelopathy, MS    Patient Stated Goals  To improve gait and balance.  She also notes getting out of bed is also more difficult.    Currently in Pain?  No/denies           Patient seen for aquatic therapy today.  Treatment took place in water 3.5-4 feet deep depending upon activity.  Pt entered and exited the pool via Step negotiation using bil. Hand rails modified independently.   Pt performed stretches for bil. LE's - runner's stretch, heel cord stretch 30 sec hold each stretch each leg  Pt performed water walking 93mlength of  pool - forwards, backwards and sideways  amb. ;  Noodle used for UE support with backwards amb.;  cues to increase step length as able to do so Marching in place 15 reps; progressed to marching forwards, backwards with use of UE support on pool noodle  Pt performed hip flexion, extension, abduction, adduction and hip internal/external rotation with knee flexed at 90 degrees - 15 reps each direction each leg with use of buoyant ankle cuff for increased resistance with the eccentric contraction Knee flexion/extension 15 reps each leg with ankle cuff  Pt performed squats bil. LE's 10 reps - without UE support to facilitate improved dynamic standing balance     Pt requires aquatic therapy for buoyancy of water for support and to be able to off load her body while minimizing fall risk; viscosity of water needed to provide resistance for strengthening trunk and LE musculature; buoyancy of water needed for support and assist with exercises to achieve greater AROM in water than able to be performed on land                               PT Short Term Goals - 09/23/18 2203  PT SHORT TERM GOAL #1   Title  The patient will return demo HEP for LE strengthening, stretching, and general balance/mobility.    Baseline  met 09-14-18    Time  4    Period  Weeks    Status  Achieved    Target Date  09/09/18      PT SHORT TERM GOAL #2   Title  Perform Berg balance test with LTG to follow.     Baseline  25/56    Time  4    Period  Weeks    Status  Achieved    Target Date  09/09/18      PT SHORT TERM GOAL #3   Title  The patient will improve gait speed from 0.11 ft/sec to > or equal to 0.3 ft/sec to demo improving mobility.    Baseline  to be assessed next session -not tested due to time constraint    Time  4    Period  Weeks    Status  On-going    Target Date  09/09/18      PT SHORT TERM GOAL #4   Title  The patient will move from supine>sitting mod indep.     Baseline  met 09-14-18 from right side; needs min assist from left sidelying to sitting    Time  4    Period  Weeks    Status  Partially Met    Target Date  09/09/18      PT SHORT TERM GOAL #5   Title  Reduce low back pain to < or equal to 2/10 (from 5/10).    Baseline  met 09-14-18    Time  4    Period  Weeks    Status  Achieved    Target Date  09/09/18        PT Long Term Goals - 09/23/18 2203      PT LONG TERM GOAL #1   Title  The patient will return demo progression of HEP for post d/c routine + return to pool program (LTG due on 10/09/2018)    Time  8    Period  Weeks      PT LONG TERM GOAL #2   Title  The patient will improve gait speed from 0.11 ft/sec to > or equal to 0.5  ft/sec to demo improving household ambulation/ gait efficiency.    Time  8    Period  Weeks      PT LONG TERM GOAL #3   Title  The patient will ambulate >300 ft nonstop with rollator RW mod indep for limited community mobility (walking into/out of appointments).      Time  8    Period  Weeks      PT LONG TERM GOAL #4   Title  The patient will move sit<>stand 5/5 trial with UE support without requiring multiple attempts.     Time  8    Period  Weeks      PT LONG TERM GOAL #5   Title  The patient will move from sitting>supine mod indep for bed mobility.    Time  8    Period  Weeks      PT LONG TERM GOAL #6   Title  Pt will decrease falls risk as indicated by improvement in BERG balance score to >/= 30/56    Baseline  25/56    Time  8    Period  Weeks    Status  New  Plan - 09/23/18 2200    Clinical Impression Statement  Pt is progressing well towards goals; pt independenty entered pool via step negotiation and started exercising independently    Rehab Potential  Good    PT Frequency  2x / week    PT Duration  8 weeks    PT Treatment/Interventions  ADLs/Self Care Home Management;Neuromuscular re-education;Patient/family education;Stair training;Gait training;Functional  mobility training;Therapeutic activities;Therapeutic exercise;Balance training;Wheelchair mobility training;Manual techniques;Orthotic Fit/Training;Aquatic Therapy;Passive range of motion;Energy conservation;Electrical Stimulation    PT Next Visit Plan  cont LE strengthening and gait training    PT Home Exercise Plan  Access Code: [email or web address]    Consulted and Agree with Plan of Care  Patient       Patient will benefit from skilled therapeutic intervention in order to improve the following deficits and impairments:     Visit Diagnosis: Other abnormalities of gait and mobility  Muscle weakness (generalized)     Problem List Patient Active Problem List   Diagnosis Date Noted  . Quadriplegia, unspecified ([place]) 12/24/2016  . Spastic paresis 09/11/2016  . Multiple sclerosis ([place]) 09/11/2016  . Muscle spasticity 10/31/2013  . Nerve root pain 01/31/2013  . Cervical myelopathy ([place]) 05/05/2011  . BP (high blood pressure) 05/05/2011  . Restless leg 05/05/2011  . Arthralgia, sacroiliac 05/05/2011    Havoc Sanluis, [place], PT, ATRIC 09/23/2018, 10:05 PM  [facility] [address] [place], [place], [address] Phone: [phone]   Fax:  [phone]  Name: Amy Bray MRN: [id number] Date of Birth: 03/06/1945

## 2018-09-29 ENCOUNTER — Other Ambulatory Visit: Payer: Self-pay

## 2018-09-29 ENCOUNTER — Ambulatory Visit: Payer: Medicare Other | Admitting: Physical Therapy

## 2018-09-29 DIAGNOSIS — R2689 Other abnormalities of gait and mobility: Secondary | ICD-10-CM | POA: Diagnosis not present

## 2018-09-29 DIAGNOSIS — M6281 Muscle weakness (generalized): Secondary | ICD-10-CM

## 2018-09-30 NOTE — Therapy (Signed)
[facility] [address] [place], [place], [address] Phone: [phone]   Fax:  [phone]  Physical Therapy Treatment  Patient Details  Name: Amy Bray MRN: [id number] Date of Birth: 10-08-1945 Referring Provider (PT): Bess Harvest, MD   Encounter Date: 09/29/2018  PT End of Session - 09/30/18 1845    Visit Number  13    Number of Visits  17    Date for PT Re-Evaluation  10/09/18    Authorization Type  Medicare and [place]- 10th visit progress note needed    PT Start Time  1325    PT Stop Time  1415    PT Time Calculation (min)  50 min    Equipment Utilized During Treatment  Other (comment)   pool noodle and aquatic cuffs   Activity Tolerance  Patient tolerated treatment well    Behavior During Therapy  [facility] for tasks assessed/performed       Past Medical History:  Diagnosis Date  . Cervical myelopathy ([place])   . Multiple sclerosis ([place])   . Osteoporosis 2018   hip    Past Surgical History:  Procedure Laterality Date  . CERVICAL FUSION  01/2001, 01/2004   C3-C6    There were no vitals filed for this visit.  Subjective Assessment - 09/30/18 1842    Subjective  Pt is in pool doing some exercises as she has been instructed - using noodle for assist with balance with water walking in the pool prior to start of session; she states she had the best day yesterday that she has had in many months    Patient is accompained by:  Family member   husand with her   Pertinent History  cervical myelopathy, MS    Patient Stated Goals  To improve gait and balance.  She also notes getting out of bed is also more difficult.    Currently in Pain?  No/denies       Aquatic therapy at [facility] [place] - pool temp 87.4 degrees  Patient seen for aquatic therapy today.  Treatment took place in water 3.5-4 feet deep depending upon activity.  Pt entered and exited the pool via Step negotiation using bil. Hand rails - modified  independent with entering pool and SBA with step ascension for exiting.   Pt performed stretches for bil. LE's - runner's stretch, heel cord stretch 30 sec hold each stretch each leg  Pt performed water walking 16mlength of pool - forwards, backwards and sideways  amb. ;  Noodle used for UE support with backwards amb.;  cues to increase step length as able to do so Marching in place 15 reps; progressed to marching forwards, backwards with use of UE support on pool noodle  Pt performed hip flexion, extension, abduction, adduction - 15 reps each direction each leg with use of buoyant ankle cuff for increased resistance with the eccentric contraction Knee flexion/extension 15 reps each leg with ankle cuff for resistance for strengthening  Pt performed squats bil. LE's 10 reps - without UE support to facilitate improved dynamic standing balance; unilateral squats 10 reps each leg with UE support on pool edge  Ai Chi postures for balance and trunk control - soothing, enclosing 10 reps each; Balancing with hand held assist LUE and RUE support on pool edge 10 reps Balance activity - stepping over and back of blue tile on pool floor for improved SLS on each leg - 10 reps each with CGA for recovery of LOB  Pt reclined in supine position with use of noodle under arms - pt performed bicycling legs [address] pool with only intermittent CGA given for flotation  Pt requires aquatic therapy for buoyancy of water for support and to be able to off load her body while minimizing fall risk; viscosity of water needed to provide resistance for strengthening trunk and LE musculature; buoyancy of water needed for support and assist with exercises to achieve greater AROM in water than able to be performed on land; balance exercises performed in water are unable to be performed on land due to risk of fall                                                PT Short Term Goals -  09/30/18 1838      PT SHORT TERM GOAL #1   Title  The patient will return demo HEP for LE strengthening, stretching, and general balance/mobility.    Baseline  met 09-14-18    Time  4    Period  Weeks    Status  Achieved    Target Date  09/09/18      PT SHORT TERM GOAL #2   Title  Perform Berg balance test with LTG to follow.     Baseline  25/56    Time  4    Period  Weeks    Status  Achieved    Target Date  09/09/18      PT SHORT TERM GOAL #3   Title  The patient will improve gait speed from 0.11 ft/sec to > or equal to 0.3 ft/sec to demo improving mobility.    Baseline  to be assessed next session -not tested due to time constraint    Time  4    Period  Weeks    Status  On-going    Target Date  09/09/18      PT SHORT TERM GOAL #4   Title  The patient will move from supine>sitting mod indep.    Baseline  met 09-14-18 from right side; needs min assist from left sidelying to sitting    Time  4    Period  Weeks    Status  Partially Met    Target Date  09/09/18      PT SHORT TERM GOAL #5   Title  Reduce low back pain to < or equal to 2/10 (from 5/10).    Baseline  met 09-14-18    Time  4    Period  Weeks    Status  Achieved    Target Date  09/09/18        PT Long Term Goals - 09/30/18 1850      PT LONG TERM GOAL #1   Title  The patient will return demo progression of HEP for post d/c routine + return to pool program (LTG due on 10/09/2018)    Time  8    Period  Weeks      PT LONG TERM GOAL #2   Title  The patient will improve gait speed from 0.11 ft/sec to > or equal to 0.5  ft/sec to demo improving household ambulation/ gait efficiency.    Time  8    Period  Weeks      PT LONG TERM GOAL #3   Title  The patient will ambulate >300  ft nonstop with rollator RW mod indep for limited community mobility (walking into/out of appointments).      Time  8    Period  Weeks      PT LONG TERM GOAL #4   Title  The patient will move sit<>stand 5/5 trial with UE support  without requiring multiple attempts.     Time  8    Period  Weeks      PT LONG TERM GOAL #5   Title  The patient will move from sitting>supine mod indep for bed mobility.    Time  8    Period  Weeks      PT LONG TERM GOAL #6   Title  Pt will decrease falls risk as indicated by improvement in BERG balance score to >/= 30/56    Baseline  25/56    Time  8    Period  Weeks    Status  New            Plan - 09/30/18 1846    Clinical Impression Statement  Pt is improving significantly with balance and gait; pt now able to independently enter pool via step negotiation and able to use noodle for UE support with balance for water walking.  Pt continues to have some difficulty with maintaining balance with Ai Chi postures but is improving with less assistance required.    Rehab Potential  Good    PT Frequency  2x / week    PT Duration  8 weeks    PT Treatment/Interventions  ADLs/Self Care Home Management;Neuromuscular re-education;Patient/family education;Stair training;Gait training;Functional mobility training;Therapeutic activities;Therapeutic exercise;Balance training;Wheelchair mobility training;Manual techniques;Orthotic Fit/Training;Aquatic Therapy;Passive range of motion;Energy conservation;Electrical Stimulation    PT Next Visit Plan  cont LE strengthening and gait training    PT Home Exercise Plan  Access Code: [email or web address]    Consulted and Agree with Plan of Care  Patient       Patient will benefit from skilled therapeutic intervention in order to improve the following deficits and impairments:  Abnormal gait, Impaired tone, Decreased activity tolerance, Decreased strength, Pain, Decreased balance, Decreased mobility, Difficulty walking, Impaired flexibility, Postural dysfunction  Visit Diagnosis: Other abnormalities of gait and mobility  Muscle weakness (generalized)     Problem List Patient Active Problem List   Diagnosis Date Noted  . Quadriplegia, unspecified ([place])  12/24/2016  . Spastic paresis 09/11/2016  . Multiple sclerosis ([place]) 09/11/2016  . Muscle spasticity 10/31/2013  . Nerve root pain 01/31/2013  . Cervical myelopathy ([place]) 05/05/2011  . BP (high blood pressure) 05/05/2011  . Restless leg 05/05/2011  . Arthralgia, sacroiliac 05/05/2011    Kriston Mckinnie, [place], PT 09/30/2018, 6:51 PM  [facility] [address] [place], [place], [address] Phone: [phone]   Fax:  [phone]  Name: Amy Bray MRN: [id number] Date of Birth: [birth year]

## 2018-10-01 ENCOUNTER — Ambulatory Visit: Payer: Medicare Other | Admitting: Rehabilitative and Restorative Service Providers"

## 2018-10-01 ENCOUNTER — Other Ambulatory Visit: Payer: Self-pay

## 2018-10-01 DIAGNOSIS — M6281 Muscle weakness (generalized): Secondary | ICD-10-CM

## 2018-10-01 DIAGNOSIS — R2689 Other abnormalities of gait and mobility: Secondary | ICD-10-CM | POA: Diagnosis not present

## 2018-10-01 DIAGNOSIS — M21371 Foot drop, right foot: Secondary | ICD-10-CM

## 2018-10-01 DIAGNOSIS — M21372 Foot drop, left foot: Secondary | ICD-10-CM

## 2018-10-01 NOTE — Therapy (Signed)
[facility] [address] [place], [place], [address] Phone: [phone]   Fax:  [phone]  Physical Therapy Treatment  Patient Details  Name: Amy Bray MRN: [id number] Date of Birth: 11-23-1945 Referring Provider (PT): Bess Harvest, MD   Encounter Date: 10/01/2018  PT End of Session - 10/01/18 1231    Visit Number  14    Number of Visits  17    Date for PT Re-Evaluation  10/09/18    Authorization Type  Medicare and [place]- 10th visit progress note needed    PT Start Time  0847    PT Stop Time  0928    PT Time Calculation (min)  41 min    Equipment Utilized During Treatment  Other (comment)   pool noodle and aquatic cuffs   Activity Tolerance  Patient tolerated treatment well    Behavior During Therapy  [facility] for tasks assessed/performed       Past Medical History:  Diagnosis Date  . Cervical myelopathy ([place])   . Multiple sclerosis ([place])   . Osteoporosis 2018   hip    Past Surgical History:  Procedure Laterality Date  . CERVICAL FUSION  01/2001, 01/2004   C3-C6    There were no vitals filed for this visit.  Subjective Assessment - 10/01/18 0923    Subjective  The patient reports she is doing much better since beginning in the pool.  She was able to walk into the drug store mod indep earlier this week.    Pertinent History  cervical myelopathy, MS    Patient Stated Goals  To improve gait and balance.  She also notes getting out of bed is also more difficult.    Currently in Pain?  Yes    Pain Score  2     Pain Location  Coccyx    Pain Orientation  Medial;Lower    Pain Descriptors / Indicators  Aching    Pain Type  Chronic pain    Pain Onset  More than a month ago    Pain Frequency  Constant    Aggravating Factors   worse at times    Pain Relieving Factors  unsure         [facility] PT Assessment - 10/01/18 0905      Standardized Balance Assessment   Standardized Balance Assessment  Berg  Balance Test      Berg Balance Test   Sit to Stand  Able to stand  independently using hands    Standing Unsupported  Able to stand safely 2 minutes    Sitting with Back Unsupported but Feet Supported on Floor or Stool  Able to sit safely and securely 2 minutes    Stand to Sit  Sits safely with minimal use of hands    Transfers  Able to transfer safely, definite need of hands    Standing Unsupported with Eyes Closed  Able to stand 10 seconds safely    Standing Unsupported with Feet Together  Able to place feet together independently and stand for 1 minute with supervision    From Standing, Reach Forward with Outstretched Arm  Can reach forward >5 cm safely (2")    From Standing Position, Pick up Object from Floor  Unable to pick up and needs supervision    From Standing Position, Turn to Look Behind Over each Shoulder  Looks behind one side only/other side shows less weight shift    Turn 360 Degrees  Needs assistance while turning  Standing Unsupported, Alternately Place Feet on Step/Stool  Needs assistance to keep from falling or unable to try    Standing Unsupported, One Foot in Front  Needs help to step but can hold 15 seconds    Standing on One Leg  Unable to try or needs assist to prevent fall    Total Score  32    Berg comment:  32/56                   [facility] Adult PT Treatment/Exercise - 10/01/18 0924      Ambulation/Gait   Ambulation/Gait  Yes    Ambulation/Gait Assistance  6: Modified independent (Device/Increase time)    Ambulation Distance (Feet)  350 Feet    Assistive device  Rollator    Gait Pattern  Step-through pattern;Decreased stride length;Decreased step length - right;Decreased step length - left;Poor foot clearance - left;Poor foot clearance - right;Right foot flat;Left foot flat    Ambulation Surface  Level;Indoor      Neuro Re-ed    Neuro Re-ed Details   rocker board standing performing alternating UE reaching R and L sides with CGA and intermittent  UE support, step down anteriorly from rocker board with min A.  Wall bumps x 10 reps with eyes open and tehn 5 reps with eyes closed.               PT Short Term Goals - 10/01/18 0901      PT SHORT TERM GOAL #1   Title  The patient will return demo HEP for LE strengthening, stretching, and general balance/mobility.    Baseline  met 09-14-18    Time  4    Period  Weeks    Status  Achieved    Target Date  09/09/18      PT SHORT TERM GOAL #2   Title  Perform Berg balance test with LTG to follow.     Baseline  25/56    Time  4    Period  Weeks    Status  Achieved    Target Date  09/09/18      PT SHORT TERM GOAL #3   Title  The patient will improve gait speed from 0.11 ft/sec to > or equal to 0.3 ft/sec to demo improving mobility.    Baseline  *patient's gait speed not tested until 10/01/2018 due to aquatics.  Patient speed is 0.79 ft/sec    Time  4    Period  Weeks    Status  Achieved    Target Date  09/09/18      PT SHORT TERM GOAL #4   Title  The patient will move from supine>sitting mod indep.    Baseline  met 09-14-18 from right side; needs min assist from left sidelying to sitting    Time  4    Period  Weeks    Status  Partially Met    Target Date  09/09/18      PT SHORT TERM GOAL #5   Title  Reduce low back pain to < or equal to 2/10 (from 5/10).    Baseline  met 09-14-18    Time  4    Period  Weeks    Status  Achieved    Target Date  09/09/18        PT Long Term Goals - 10/01/18 0849      PT LONG TERM GOAL #1   Title  The patient will return demo progression of HEP  for post d/c routine + return to pool program (LTG due on 10/09/2018)    Time  8    Period  Weeks      PT LONG TERM GOAL #2   Title  The patient will improve gait speed from 0.11 ft/sec to > or equal to 0.5  ft/sec to demo improving household ambulation/ gait efficiency.    Baseline  0.79 ft/seconds    Time  8    Period  Weeks    Status  Achieved      PT LONG TERM GOAL #3   Title  The  patient will ambulate >300 ft nonstop with rollator RW mod indep for limited community mobility (walking into/out of appointments).      Baseline  350 ft mod indep with rollator RW.    Time  8    Period  Weeks    Status  Achieved      PT LONG TERM GOAL #4   Title  The patient will move sit<>stand 5/5 trial with UE support without requiring multiple attempts.     Baseline  Met on 10/01/18    Time  8    Period  Weeks    Status  Achieved      PT LONG TERM GOAL #5   Title  The patient will move from sitting>supine mod indep for bed mobility.    Baseline  Met per subjective reports    Time  8    Period  Weeks    Status  Achieved      PT LONG TERM GOAL #6   Title  Pt will decrease falls risk as indicated by improvement in BERG balance score to >/= 30/56    Baseline  25/56 at eval and 32/56 on 10/01/18    Time  8    Period  Weeks    Status  Achieved            Plan - 10/01/18 1231    Clinical Impression Statement  The patient has made significant progress with mobility with return to aquatics.  She has met 5/6 LTGs early.  PT to continue working on post d/c plan for aquatics.  May be able to d/c early based on progress (will resubmit for recertification and update goals by 9/21).    PT Treatment/Interventions  ADLs/Self Care Home Management;Neuromuscular re-education;Patient/family education;Stair training;Gait training;Functional mobility training;Therapeutic activities;Therapeutic exercise;Balance training;Wheelchair mobility training;Manual techniques;Orthotic Fit/Training;Aquatic Therapy;Passive range of motion;Energy conservation;Electrical Stimulation    PT Next Visit Plan  Progress mobility, work on crossing legs and lifting legs into car (without UE use).  Work on [employer].    PT Home Exercise Plan  Access Code: [email or web address]    Consulted and Agree with Plan of Care  Patient       Patient will benefit from skilled therapeutic intervention in order to improve the following  deficits and impairments:  Abnormal gait, Impaired tone, Decreased activity tolerance, Decreased strength, Pain, Decreased balance, Decreased mobility, Difficulty walking, Impaired flexibility, Postural dysfunction  Visit Diagnosis: Other abnormalities of gait and mobility  Muscle weakness (generalized)  Foot drop, left  Foot drop, right     Problem List Patient Active Problem List   Diagnosis Date Noted  . Quadriplegia, unspecified ([place]) 12/24/2016  . Spastic paresis 09/11/2016  . Multiple sclerosis ([place]) 09/11/2016  . Muscle spasticity 10/31/2013  . Nerve root pain 01/31/2013  . Cervical myelopathy ([place]) 05/05/2011  . BP (high blood pressure) 05/05/2011  . Restless leg 05/05/2011  .  Arthralgia, sacroiliac 05/05/2011    Cielo Arias , PT 10/01/2018, 4:02 PM  [facility] [address] [place], [place], [address] Phone: [phone]   Fax:  [phone]  Name: STEFANY STARACE MRN: [id number] Date of Birth: [birth year]

## 2018-10-06 ENCOUNTER — Other Ambulatory Visit: Payer: Self-pay

## 2018-10-06 ENCOUNTER — Ambulatory Visit: Payer: Medicare Other | Admitting: Physical Therapy

## 2018-10-06 DIAGNOSIS — R2689 Other abnormalities of gait and mobility: Secondary | ICD-10-CM | POA: Diagnosis not present

## 2018-10-06 DIAGNOSIS — M6281 Muscle weakness (generalized): Secondary | ICD-10-CM

## 2018-10-07 NOTE — Therapy (Signed)
[facility] [address] [place], [place], [address] Phone: [phone]   Fax:  [phone]  Physical Therapy Treatment  Patient Details  Name: Amy Bray MRN: [id number] Date of Birth: [birth year] Referring Provider (PT): Bess Harvest, MD   Encounter Date: 10/06/2018  PT End of Session - 10/07/18 1634    Visit Number  15    Number of Visits  18    Date for PT Re-Evaluation  10/15/18    Authorization Type  Medicare and [place]- 10th visit progress note needed    PT Start Time  1330    PT Stop Time  1415    PT Time Calculation (min)  45 min    Equipment Utilized During Treatment  Other (comment)   pool noodle and aquatic cuffs   Activity Tolerance  Patient tolerated treatment well    Behavior During Therapy  [facility] for tasks assessed/performed       Past Medical History:  Diagnosis Date  . Cervical myelopathy ([place])   . Multiple sclerosis ([place])   . Osteoporosis 2018   hip    Past Surgical History:  Procedure Laterality Date  . CERVICAL FUSION  01/2001, 01/2004   C3-C6    There were no vitals filed for this visit.  Subjective Assessment - 10/07/18 1631    Subjective  Pt states she went to pool [facility]) on Tuesday this week and did her aquatic exercise program independently - had no problems; pt states she plans to D/C from PT next week - has pool session on Wed. and land session with Margreta Journey on Friday, 10-15-18 and will D/C at that time    Patient is accompained by:  Family member    Pertinent History  cervical myelopathy, MS    Patient Stated Goals  To improve gait and balance.  She also notes getting out of bed is also more difficult.    Currently in Pain?  No/denies    Pain Onset  More than a month ago         Aquatic therapy at [facility] - pool temp 87.4 degrees  Patient seen for aquatic therapy today.  Treatment took place in water 3.5-4 feet deep depending upon activity.  Pt entered and exited the pool  via Step negotiation using bil. Hand rails modified independently.   Pt performed stretches for bil. LE's - runner's stretch, heel cord stretch 30 sec hold each stretch each leg at beginning of session  Pt performed water walking 58mlength of pool - forwards, backwards and sideways  amb. Without UE support as pt's balance has signficantly improved:  Noodle used for UE support with backwards amb.;  Pt held noodle (which was stabilized by PT) and gently pulled forward to fascilitate increased step length bil. LE's  Marching in place 15 reps; progressed to marching forwards, backwards with use of UE support on pool noodle  Pt performed hip flexion, extension, abduction:  hip abduction/adduction with knee flexed at 90 degrees - 15 reps each direction each leg with use of buoyant ankle cuff for increased resistance with the eccentric contraction Knee flexion/extension 15 reps each leg with ankle cuff  Pt performed squats bil. LE's 10 reps - without UE support to facilitate improved dynamic standing balance; unilateral squats 10 reps each leg for strengthening   Pt performed standing balance activity - touching dark blue colored tile on pool floor with each leg 10 reps for improved SLS on stance leg - CGA to min assist  needed with balance  Ai Chi postures - Soothing, Freeing, and Gathering - 10 reps each - pt positioned against pool wall for stability as needed   Pt requires aquatic therapy for buoyancy of water for support and to be able to off load her body while minimizing fall risk; viscosity of water needed to provide resistance for strengthening trunk and LE musculature; buoyancy of water needed for support and assist with exercises to achieve greater AROM in water than able to be performed on land                                       PT Short Term Goals - 10/07/18 1648      PT SHORT TERM GOAL #1   Title  The patient will return demo HEP for LE  strengthening, stretching, and general balance/mobility.    Baseline  met 09-14-18    Time  4    Period  Weeks    Status  Achieved    Target Date  09/09/18      PT SHORT TERM GOAL #2   Title  Perform Berg balance test with LTG to follow.     Baseline  25/56    Time  4    Period  Weeks    Status  Achieved    Target Date  09/09/18      PT SHORT TERM GOAL #3   Title  The patient will improve gait speed from 0.11 ft/sec to > or equal to 0.3 ft/sec to demo improving mobility.    Baseline  *patient's gait speed not tested until 10/01/2018 due to aquatics.  Patient speed is 0.79 ft/sec    Time  4    Period  Weeks    Status  Achieved    Target Date  09/09/18      PT SHORT TERM GOAL #4   Title  The patient will move from supine>sitting mod indep.    Baseline  met 09-14-18 from right side; needs min assist from left sidelying to sitting    Time  4    Period  Weeks    Status  Partially Met    Target Date  09/09/18      PT SHORT TERM GOAL #5   Title  Reduce low back pain to < or equal to 2/10 (from 5/10).    Baseline  met 09-14-18    Time  4    Period  Weeks    Status  Achieved    Target Date  09/09/18        PT Long Term Goals - 10/07/18 1648      PT LONG TERM GOAL #1   Title  The patient will return demo progression of HEP for post d/c routine + return to pool program (LTG due on 10/09/2018)    Time  8    Period  Weeks      PT LONG TERM GOAL #2   Title  The patient will improve gait speed from 0.11 ft/sec to > or equal to 0.5  ft/sec to demo improving household ambulation/ gait efficiency.    Baseline  0.79 ft/seconds    Time  8    Period  Weeks    Status  Achieved      PT LONG TERM GOAL #3   Title  The patient will ambulate >300 ft nonstop with rollator RW mod indep  for limited community mobility (walking into/out of appointments).      Baseline  350 ft mod indep with rollator RW.    Time  8    Period  Weeks    Status  Achieved      PT LONG TERM GOAL #4   Title  The  patient will move sit<>stand 5/5 trial with UE support without requiring multiple attempts.     Baseline  Met on 10/01/18    Time  8    Period  Weeks    Status  Achieved      PT LONG TERM GOAL #5   Title  The patient will move from sitting>supine mod indep for bed mobility.    Baseline  Met per subjective reports    Time  8    Period  Weeks    Status  Achieved      PT LONG TERM GOAL #6   Title  Pt will decrease falls risk as indicated by improvement in BERG balance score to >/= 30/56    Baseline  25/56 at eval and 32/56 on 10/01/18    Time  8    Period  Weeks    Status  Achieved            Plan - 10/07/18 1637    Clinical Impression Statement  Pt continues to progress well with aquatic exercises - pt now able to perform water walking without use of noodle as balance is signficantly improving; pt does continue to lose balance with Ai Chi postures and needs to be against pool wall for assistance with balance as needed - unsteadiness with these exercises due to decreased trunk strength/decreased core stabilization.  Pt states she is ready for D/C as she is getting back to prior functional level pre-Covid.    PT Treatment/Interventions  ADLs/Self Care Home Management;Neuromuscular re-education;Patient/family education;Stair training;Gait training;Functional mobility training;Therapeutic activities;Therapeutic exercise;Balance training;Wheelchair mobility training;Manual techniques;Orthotic Fit/Training;Aquatic Therapy;Passive range of motion;Energy conservation;Electrical Stimulation    PT Next Visit Plan  Progress mobility, work on crossing legs and lifting legs into car (without UE use).  Work on LE strengthening. - PLAN D/C on 10-15-18 (last aquatic appt will be 10-13-18)    PT Home Exercise Plan  Access Code: [email or web address]    Consulted and Agree with Plan of Care  Patient       Patient will benefit from skilled therapeutic intervention in order to improve the following deficits and  impairments:  Abnormal gait, Impaired tone, Decreased activity tolerance, Decreased strength, Pain, Decreased balance, Decreased mobility, Difficulty walking, Impaired flexibility, Postural dysfunction  Visit Diagnosis: Other abnormalities of gait and mobility  Muscle weakness (generalized)     Problem List Patient Active Problem List   Diagnosis Date Noted  . Quadriplegia, unspecified ([place]) 12/24/2016  . Spastic paresis 09/11/2016  . Multiple sclerosis ([place]) 09/11/2016  . Muscle spasticity 10/31/2013  . Nerve root pain 01/31/2013  . Cervical myelopathy ([place]) 05/05/2011  . BP (high blood pressure) 05/05/2011  . Restless leg 05/05/2011  . Arthralgia, sacroiliac 05/05/2011    Michaila Kenney, [place], PT, ATRIC 10/07/2018, 4:49 PM  [facility] [address] [place], [place], [address] Phone: [phone]   Fax:  [phone]  Name: KAORU BENDA MRN: [id number] Date of Birth: 1945-06-24

## 2018-10-08 ENCOUNTER — Ambulatory Visit: Payer: Medicare Other | Admitting: Rehabilitative and Restorative Service Providers"

## 2018-10-08 ENCOUNTER — Other Ambulatory Visit: Payer: Self-pay

## 2018-10-08 DIAGNOSIS — M21372 Foot drop, left foot: Secondary | ICD-10-CM

## 2018-10-08 DIAGNOSIS — R2689 Other abnormalities of gait and mobility: Secondary | ICD-10-CM

## 2018-10-08 DIAGNOSIS — M6289 Other specified disorders of muscle: Secondary | ICD-10-CM

## 2018-10-08 DIAGNOSIS — M6281 Muscle weakness (generalized): Secondary | ICD-10-CM

## 2018-10-08 DIAGNOSIS — M21371 Foot drop, right foot: Secondary | ICD-10-CM

## 2018-10-08 NOTE — Patient Instructions (Signed)
Access Code: [email or web address]  URL: [email or web address]  Date: 10/08/2018  Prepared by: Rudell Cobb   Exercises Supine Lower Trunk Rotation - 10 reps - 1 sets - 5 hold - 1x daily - 7x weekly Soleus Stretch on Wall - 2 reps - 1 sets - 30 hold - 1x daily - 7x weekly sitting toe stretch - 2 reps - 1 sets - 30 seconds hold - 2x daily - 7x weekly Seated March - 10 reps - 1-2 sets - 1x daily - 7x weekly Supine Butterfly Groin Stretch - 3 sets - 30 second hold - 2x daily - 7x weekly Supine Bridge with Resistance Band - 10 reps - 1 sets - 1x daily - 7x weekly

## 2018-10-08 NOTE — Therapy (Signed)
[facility] [address] [place], [place], [address] Phone: [phone]   Fax:  [phone]  Physical Therapy Treatment  Patient Details  Name: Amy Bray MRN: [id number] Date of Birth: [birth year] Referring Provider (PT): Bess Harvest, MD   Encounter Date: 10/08/2018  PT End of Session - 10/08/18 0939    Visit Number  16    Number of Visits  18    Date for PT Re-Evaluation  10/15/18    Authorization Type  Medicare and [place]- 10th visit progress note needed    PT Start Time  0935    PT Stop Time  1014    PT Time Calculation (min)  39 min    Activity Tolerance  Patient tolerated treatment well    Behavior During Therapy  [facility] for tasks assessed/performed       Past Medical History:  Diagnosis Date  . Cervical myelopathy ([place])   . Multiple sclerosis ([place])   . Osteoporosis 2018   hip    Past Surgical History:  Procedure Laterality Date  . CERVICAL FUSION  01/2001, 01/2004   C3-C6    There were no vitals filed for this visit.  Subjective Assessment - 10/08/18 0946    Subjective  The patient plans to finish PT next week (PT to renew for next week's dates).  She feels able to continue HEP with pool post d/c.    Pertinent History  cervical myelopathy, MS    Patient Stated Goals  To improve gait and balance.  She also notes getting out of bed is also more difficult.    Currently in Pain?  No/denies                       [facility] Adult PT Treatment/Exercise - 10/08/18 0946      Neuro Re-ed    Neuro Re-ed Details   The patient performed standing  ant/posterior weight shifting with UE support with each foot forward x 10 reps.    Wall bumps x 10 reps with emphasis on upright posture at midline.      Exercises   Exercises  Other Exercises    Other Exercises   Seated wide leg marching lifting L foot onto 4" step x 5 reps, then right foot onto 4" step x 5 reps.  Seated marching x 5 reps each leg  with leaning posteriorly to use momentum.  Seated at corner of mat table lifting L leg wide<>narrow x 5 reps and repeat on the R side.  Supine marching x 8 reps R and L sides alternating, beginning bridge x 10 reps, supine isometric hip abduction (in hooklying with manual pressure), rolling R<>L with "t" positin at midline for core strengthening.  Wall slides in standing x 10 reps.  REVIEWED HEP:  toe stretch sitting, supine butterfly, sitting marching.             PT Education - 10/08/18 1007    Education Details  reviewed HEP    Person(s) Educated  Patient    Methods  Explanation;Demonstration;Handout    Comprehension  Verbalized understanding;Returned demonstration       PT Short Term Goals - 10/07/18 1648      PT SHORT TERM GOAL #1   Title  The patient will return demo HEP for LE strengthening, stretching, and general balance/mobility.    Baseline  met 09-14-18    Time  4    Period  Weeks    Status  Achieved    Target Date  09/09/18      PT SHORT TERM GOAL #2   Title  Perform Berg balance test with LTG to follow.     Baseline  25/56    Time  4    Period  Weeks    Status  Achieved    Target Date  09/09/18      PT SHORT TERM GOAL #3   Title  The patient will improve gait speed from 0.11 ft/sec to > or equal to 0.3 ft/sec to demo improving mobility.    Baseline  *patient's gait speed not tested until 10/01/2018 due to aquatics.  Patient speed is 0.79 ft/sec    Time  4    Period  Weeks    Status  Achieved    Target Date  09/09/18      PT SHORT TERM GOAL #4   Title  The patient will move from supine>sitting mod indep.    Baseline  met 09-14-18 from right side; needs min assist from left sidelying to sitting    Time  4    Period  Weeks    Status  Partially Met    Target Date  09/09/18      PT SHORT TERM GOAL #5   Title  Reduce low back pain to < or equal to 2/10 (from 5/10).    Baseline  met 09-14-18    Time  4    Period  Weeks    Status  Achieved    Target Date   09/09/18        PT Long Term Goals - 10/08/18 1457      PT LONG TERM GOAL #1   Title  The patient will return demo progression of HEP for post d/c routine + return to pool program (LTG due on 10/09/2018)    Time  8    Period  Weeks    Status  Revised    Target Date  10/22/18      PT LONG TERM GOAL #2   Title  The patient will improve gait speed from 0.11 ft/sec to > or equal to 0.5  ft/sec to demo improving household ambulation/ gait efficiency.    Baseline  0.79 ft/seconds    Time  8    Period  Weeks    Status  Achieved      PT LONG TERM GOAL #3   Title  The patient will ambulate >300 ft nonstop with rollator RW mod indep for limited community mobility (walking into/out of appointments).      Baseline  350 ft mod indep with rollator RW.    Time  8    Period  Weeks    Status  Achieved      PT LONG TERM GOAL #4   Title  The patient will move sit<>stand 5/5 trial with UE support without requiring multiple attempts.     Baseline  Met on 10/01/18    Time  8    Period  Weeks    Status  Achieved      PT LONG TERM GOAL #5   Title  The patient will move from sitting>supine mod indep for bed mobility.    Baseline  Met per subjective reports    Time  8    Period  Weeks    Status  Achieved      PT LONG TERM GOAL #6   Title  Pt will decrease falls risk as indicated by improvement  in BERG balance score to >/= 30/56    Baseline  25/56 at eval and 32/56 on 10/01/18    Time  8    Period  Weeks    Status  Achieved            Plan - 10/08/18 1458    Clinical Impression Statement  PT renewing to add one more week of physical therapy to meet initial # of visits established at evaluation.  We will continue to work to LTG #1 ensuring updated HEP (reviewed today in clinic) and pool wellness program.    PT Treatment/Interventions  ADLs/Self Care Home Management;Neuromuscular re-education;Patient/family education;Stair training;Gait training;Functional mobility training;Therapeutic  activities;Therapeutic exercise;Balance training;Wheelchair mobility training;Manual techniques;Orthotic Fit/Training;Aquatic Therapy;Passive range of motion;Energy conservation;Electrical Stimulation    PT Next Visit Plan  d/c next week, check LTG remaining for HEP progression.    PT Home Exercise Plan  Access Code: [email or web address]    Consulted and Agree with Plan of Care  Patient       Patient will benefit from skilled therapeutic intervention in order to improve the following deficits and impairments:  Abnormal gait, Impaired tone, Decreased activity tolerance, Decreased strength, Pain, Decreased balance, Decreased mobility, Difficulty walking, Impaired flexibility, Postural dysfunction  Visit Diagnosis: Other abnormalities of gait and mobility  Muscle weakness (generalized)  Foot drop, left  Foot drop, right  Muscle hypertonicity     Problem List Patient Active Problem List   Diagnosis Date Noted  . Quadriplegia, unspecified ([place]) 12/24/2016  . Spastic paresis 09/11/2016  . Multiple sclerosis ([place]) 09/11/2016  . Muscle spasticity 10/31/2013  . Nerve root pain 01/31/2013  . Cervical myelopathy ([place]) 05/05/2011  . BP (high blood pressure) 05/05/2011  . Restless leg 05/05/2011  . Arthralgia, sacroiliac 05/05/2011    Kahlel Peake, PT 10/08/2018, 3:00 PM  [facility] [address] [place], [place], [address] Phone: [phone]   Fax:  [phone]  Name: Amy Bray MRN: [id number] Date of Birth: November 22, 1945

## 2018-10-13 ENCOUNTER — Ambulatory Visit: Payer: Medicare Other | Admitting: Physical Therapy

## 2018-10-13 ENCOUNTER — Other Ambulatory Visit: Payer: Self-pay

## 2018-10-13 DIAGNOSIS — R2689 Other abnormalities of gait and mobility: Secondary | ICD-10-CM | POA: Diagnosis not present

## 2018-10-13 DIAGNOSIS — M6281 Muscle weakness (generalized): Secondary | ICD-10-CM

## 2018-10-14 NOTE — Therapy (Signed)
[facility] [address] [place], [place], [address] Phone: [phone]   Fax:  [phone]  Physical Therapy Treatment  Patient Details  Name: Amy Bray MRN: [id number] Date of Birth: Jan 29, 1945 Referring Provider (PT): Bess Harvest, MD   Encounter Date: 10/13/2018  PT End of Session - 10/13/18 2108    Visit Number  17    Number of Visits  18    Date for PT Re-Evaluation  10/15/18    Authorization Type  Medicare and [place]- 10th visit progress note needed    PT Start Time  1320    PT Stop Time  1410    PT Time Calculation (min)  50 min    Equipment Utilized During Treatment  Other (comment)   pool noodle, ankle cuffs and floatation belt   Activity Tolerance  Patient tolerated treatment well    Behavior During Therapy  [facility] for tasks assessed/performed       Past Medical History:  Diagnosis Date  . Cervical myelopathy ([place])   . Multiple sclerosis ([place])   . Osteoporosis 2018   hip    Past Surgical History:  Procedure Laterality Date  . CERVICAL FUSION  01/2001, 01/2004   C3-C6    There were no vitals filed for this visit.  Subjective Assessment - 10/13/18 2106    Subjective  Pt states she came to pool [facility]) yesterday and exercised independently on her own - pt states she is doing well and is ready to D/C from PT this week (today is last scheduled appt for aquatic therapy)    Pertinent History  cervical myelopathy, MS    Patient Stated Goals  To improve gait and balance.  She also notes getting out of bed is also more difficult.    Currently in Pain?  No/denies          Aquatic therapy at [facility] - pool temp 87.6 degrees  Patient seen for aquatic therapy today.  Treatment took place in water 3.5-4 feet deep depending upon activity.  Pt entered and exited the pool via Step negotiation using bil. Hand rails - SBA with step descension & ascension. Pt now able to clear top step with LLE without using  hand to assist LE with hip flexion.   Pt performed stretches for bil. LE's - runner's stretch, heel cord stretch 30 sec hold each stretch each leg  Pt performed water walking 34mlength of pool - forwards, backwards and sideways  amb. ;  Noodle used for UE support with backwards amb.;  cues to increase step length on 1st rep and to increase speed on 2nd rep (as able in order to fascilitate incr. Gait speed) Marching in place 15 reps; progressed to marching forwards, backwards with use of UE support on pool noodle  Pt performed hip flexion, extension, abduction, adduction - 15 reps each direction each leg with use of buoyant ankle cuff for increased resistance with the eccentric contraction Knee flexion/extension 15 reps each leg with ankle cuff for resistance for strengthening  Pt performed squats bil. LE's 10 reps - without UE support to facilitate improved dynamic standing balance; unilateral squats 10 reps each leg with UE support on pool edge  Ai Chi postures for balance and trunk control - soothing, and gathering 10 reps each; Balancing with hand held assist LUE and RUE support on pool edge 10 reps   Pt reclined in supine position with use of noodle under arms - pt performed bicycling legs 230m  across pool with only intermittent CGA given for floatation; hip abduction/adduction in this supine position 2 sets 10 reps; bil. Knees to chest 10 reps in supine position  Pt requires aquatic therapy for buoyancy of water for support and to be able to off load her body while minimizing fall risk; viscosity of water needed to provide resistance for strengthening trunk and LE musculature; buoyancy of water needed for support and assist with exercises to achieve greater AROM in water than able to be performed on land; balance exercises performed in water are unable to be performed on land due to risk of fall                             PT Short Term Goals - 10/07/18 1648       PT SHORT TERM GOAL #1   Title  The patient will return demo HEP for LE strengthening, stretching, and general balance/mobility.    Baseline  met 09-14-18    Time  4    Period  Weeks    Status  Achieved    Target Date  09/09/18      PT SHORT TERM GOAL #2   Title  Perform Berg balance test with LTG to follow.     Baseline  25/56    Time  4    Period  Weeks    Status  Achieved    Target Date  09/09/18      PT SHORT TERM GOAL #3   Title  The patient will improve gait speed from 0.11 ft/sec to > or equal to 0.3 ft/sec to demo improving mobility.    Baseline  *patient's gait speed not tested until 10/01/2018 due to aquatics.  Patient speed is 0.79 ft/sec    Time  4    Period  Weeks    Status  Achieved    Target Date  09/09/18      PT SHORT TERM GOAL #4   Title  The patient will move from supine>sitting mod indep.    Baseline  met 09-14-18 from right side; needs min assist from left sidelying to sitting    Time  4    Period  Weeks    Status  Partially Met    Target Date  09/09/18      PT SHORT TERM GOAL #5   Title  Reduce low back pain to < or equal to 2/10 (from 5/10).    Baseline  met 09-14-18    Time  4    Period  Weeks    Status  Achieved    Target Date  09/09/18        PT Long Term Goals - 10/13/18 2109      PT LONG TERM GOAL #1   Title  The patient will return demo progression of HEP for post d/c routine + return to pool program (LTG due on 10/09/2018)    Time  8    Period  Weeks    Status  Revised      PT LONG TERM GOAL #2   Title  The patient will improve gait speed from 0.11 ft/sec to > or equal to 0.5  ft/sec to demo improving household ambulation/ gait efficiency.    Baseline  0.79 ft/seconds    Time  8    Period  Weeks    Status  Achieved      PT LONG TERM GOAL #3  Title  The patient will ambulate >300 ft nonstop with rollator RW mod indep for limited community mobility (walking into/out of appointments).      Baseline  350 ft mod indep with rollator RW.     Time  8    Period  Weeks    Status  Achieved      PT LONG TERM GOAL #4   Title  The patient will move sit<>stand 5/5 trial with UE support without requiring multiple attempts.     Baseline  Met on 10/01/18    Time  8    Period  Weeks    Status  Achieved      PT LONG TERM GOAL #5   Title  The patient will move from sitting>supine mod indep for bed mobility.    Baseline  Met per subjective reports    Time  8    Period  Weeks    Status  Achieved      PT LONG TERM GOAL #6   Title  Pt will decrease falls risk as indicated by improvement in BERG balance score to >/= 30/56    Baseline  25/56 at eval and 32/56 on 10/01/18    Time  8    Period  Weeks    Status  Achieved            Plan - 10/14/18 1227    Clinical Impression Statement  Pt is progressing well with aquatic exercises; pt is now going to [facility] and performing her aquatic exercise program independently.  Pt is able amb. in pool without UE support on object but uses noodle for stability & security.  Pt still does need support of pool wall intermittently when performing Ai Chi postures, due to decreased core stabilization and decr. balance.    PT Treatment/Interventions  ADLs/Self Care Home Management;Neuromuscular re-education;Patient/family education;Stair training;Gait training;Functional mobility training;Therapeutic activities;Therapeutic exercise;Balance training;Wheelchair mobility training;Manual techniques;Orthotic Fit/Training;Aquatic Therapy;Passive range of motion;Energy conservation;Electrical Stimulation    PT Next Visit Plan  d/c next week, check LTG remaining for HEP progression.    PT Home Exercise Plan  Access Code: [email or web address]    Consulted and Agree with Plan of Care  Patient       Patient will benefit from skilled therapeutic intervention in order to improve the following deficits and impairments:  Abnormal gait, Impaired tone, Decreased activity tolerance, Decreased strength, Pain, Decreased balance, Decreased  mobility, Difficulty walking, Impaired flexibility, Postural dysfunction  Visit Diagnosis: Other abnormalities of gait and mobility  Muscle weakness (generalized)     Problem List Patient Active Problem List   Diagnosis Date Noted  . Quadriplegia, unspecified ([place]) 12/24/2016  . Spastic paresis 09/11/2016  . Multiple sclerosis ([place]) 09/11/2016  . Muscle spasticity 10/31/2013  . Nerve root pain 01/31/2013  . Cervical myelopathy ([place]) 05/05/2011  . BP (high blood pressure) 05/05/2011  . Restless leg 05/05/2011  . Arthralgia, sacroiliac 05/05/2011    Shrihan Putt, [place], PT, ATRIC 10/14/2018, 8:35 PM  [facility] [address] [place], [place], [address] Phone: [phone]   Fax:  [phone]  Name: Amy Bray MRN: [id number] Date of Birth: 1945/07/18

## 2018-10-15 ENCOUNTER — Ambulatory Visit: Payer: Medicare Other | Admitting: Rehabilitative and Restorative Service Providers"

## 2018-10-15 ENCOUNTER — Encounter: Payer: Self-pay | Admitting: Rehabilitative and Restorative Service Providers"

## 2018-10-15 ENCOUNTER — Other Ambulatory Visit: Payer: Self-pay

## 2018-10-15 DIAGNOSIS — M6289 Other specified disorders of muscle: Secondary | ICD-10-CM

## 2018-10-15 DIAGNOSIS — M6281 Muscle weakness (generalized): Secondary | ICD-10-CM

## 2018-10-15 DIAGNOSIS — R2689 Other abnormalities of gait and mobility: Secondary | ICD-10-CM

## 2018-10-15 NOTE — Patient Instructions (Signed)
Access Code: [email or web address]  URL: [email or web address]  Date: 10/15/2018  Prepared by: Rudell Cobb   Exercises Supine Lower Trunk Rotation - 10 reps - 1 sets - 5 hold - 1x daily - 7x weekly sitting toe stretch - 2 reps - 1 sets - 30 seconds hold - 2x daily - 7x weekly Seated March - 10 reps - 1-2 sets - 1x daily - 7x weekly Supine Butterfly Groin Stretch - 3 sets - 30 second hold - 2x daily - 7x weekly Supine Bridge with Resistance Band - 10 reps - 1 sets - 1x daily - 7x weekly Gastroc Stretch on Wall - 2 reps - 30 sec hold - 1x daily - 7x weekly

## 2018-10-15 NOTE — Therapy (Signed)
[facility] [address] [place], [place], [address] Phone: [phone]   Fax:  [phone]  Physical Therapy Treatment /Discharge Summary  Patient Details  Name: Amy Bray MRN: [id number] Date of Birth: February 19, 1945 Referring Provider (PT): Bess Harvest, MD   Encounter Date: 10/15/2018  PT End of Session - 10/15/18 1532    Visit Number  18    Number of Visits  18    Date for PT Re-Evaluation  10/15/18    Authorization Type  Medicare and [place]- 10th visit progress note needed    PT Start Time  1448    PT Stop Time  1528    PT Time Calculation (min)  40 min    Equipment Utilized During Treatment  Other (comment)   pool noodle, ankle cuffs and floatation belt   Activity Tolerance  Patient tolerated treatment well    Behavior During Therapy  [facility] for tasks assessed/performed       Past Medical History:  Diagnosis Date  . Cervical myelopathy ([place])   . Multiple sclerosis ([place])   . Osteoporosis 2018   hip    Past Surgical History:  Procedure Laterality Date  . CERVICAL FUSION  01/2001, 01/2004   C3-C6    There were no vitals filed for this visit.  Subjective Assessment - 10/15/18 1528    Subjective  Pt reports that she feels she has really benefited from returning to pool.    Pertinent History  cervical myelopathy, MS    Patient Stated Goals  To improve gait and balance.  She also notes getting out of bed is also more difficult.    Currently in Pain?  No/denies                       [facility] Adult PT Treatment/Exercise - 10/15/18 1459      Exercises   Other Exercises   Supine butterfly stretch 30 sec each side. Hooklying trunk rotation  with arms out to side x 5, bridging x 10 with verbal cues to keep knees apart. Seated toe stretch  x 30 sec each foot. Pt instructed to be sure to feel stretch through toes and not just lift heel. Pt reported she could feel it. Seated march x 10 bilateral  with only partial range. Pt was leaning back on mat so transferred to chair with back to perform. Standing soleus stretch attempted with verbal cues and demonstration but unable to perform with correct form. Changed to gastroc stretch.             PT Education - 10/15/18 1525    Education Details  PT reviewed HEP with patient and updated.    Person(s) Educated  Patient    Methods  Explanation;Handout;Demonstration    Comprehension  Verbalized understanding;Returned demonstration       PT Short Term Goals - 10/07/18 1648      PT SHORT TERM GOAL #1   Title  The patient will return demo HEP for LE strengthening, stretching, and general balance/mobility.    Baseline  met 09-14-18    Time  4    Period  Weeks    Status  Achieved    Target Date  09/09/18      PT SHORT TERM GOAL #2   Title  Perform Berg balance test with LTG to follow.     Baseline  25/56    Time  4    Period  Weeks  Status  Achieved    Target Date  09/09/18      PT SHORT TERM GOAL #3   Title  The patient will improve gait speed from 0.11 ft/sec to > or equal to 0.3 ft/sec to demo improving mobility.    Baseline  *patient's gait speed not tested until 10/01/2018 due to aquatics.  Patient speed is 0.79 ft/sec    Time  4    Period  Weeks    Status  Achieved    Target Date  09/09/18      PT SHORT TERM GOAL #4   Title  The patient will move from supine>sitting mod indep.    Baseline  met 09-14-18 from right side; needs min assist from left sidelying to sitting    Time  4    Period  Weeks    Status  Partially Met    Target Date  09/09/18      PT SHORT TERM GOAL #5   Title  Reduce low back pain to < or equal to 2/10 (from 5/10).    Baseline  met 09-14-18    Time  4    Period  Weeks    Status  Achieved    Target Date  09/09/18        PT Long Term Goals - 10/15/18 1522      PT LONG TERM GOAL #1   Title  The patient will return demo progression of HEP for post d/c routine + return to pool program (LTG  due on 10/09/2018)    Baseline  The patient is using pool as main home program, she needs cues for land home exercise program.    Time  8    Period  Weeks    Status  Partially Met      PT LONG TERM GOAL #2   Title  The patient will improve gait speed from 0.11 ft/sec to > or equal to 0.5  ft/sec to demo improving household ambulation/ gait efficiency.    Baseline  0.79 ft/seconds    Time  8    Period  Weeks    Status  Achieved      PT LONG TERM GOAL #3   Title  The patient will ambulate >300 ft nonstop with rollator RW mod indep for limited community mobility (walking into/out of appointments).      Baseline  350 ft mod indep with rollator RW.    Time  8    Period  Weeks    Status  Achieved      PT LONG TERM GOAL #4   Title  The patient will move sit<>stand 5/5 trial with UE support without requiring multiple attempts.     Baseline  Met on 10/01/18    Time  8    Period  Weeks    Status  Achieved      PT LONG TERM GOAL #5   Title  The patient will move from sitting>supine mod indep for bed mobility.    Baseline  Met per subjective reports    Time  8    Period  Weeks    Status  Achieved      PT LONG TERM GOAL #6   Title  Pt will decrease falls risk as indicated by improvement in BERG balance score to >/= 30/56    Baseline  25/56 at eval and 32/56 on 10/01/18    Time  8    Period  Weeks    Status  Achieved            Plan - 10/15/18 1533    Clinical Impression Statement  The patient has met LTGs and has returned to modified indep aquatic program at the [facility].  She has land HEP to perform on days she is not exercising in water.  The patient reports return to mod indep mobility in the home.    PT Treatment/Interventions  ADLs/Self Care Home Management;Neuromuscular re-education;Patient/family education;Stair training;Gait training;Functional mobility training;Therapeutic activities;Therapeutic exercise;Balance training;Wheelchair mobility training;Manual techniques;Orthotic  Fit/Training;Aquatic Therapy;Passive range of motion;Energy conservation;Electrical Stimulation    PT Next Visit Plan  discharge    PT Home Exercise Plan  Access Code: [email or web address]    Consulted and Agree with Plan of Care  Patient       Patient will benefit from skilled therapeutic intervention in order to improve the following deficits and impairments:  Abnormal gait, Impaired tone, Decreased activity tolerance, Decreased strength, Pain, Decreased balance, Decreased mobility, Difficulty walking, Impaired flexibility, Postural dysfunction  Visit Diagnosis: Other abnormalities of gait and mobility  Muscle weakness (generalized)  Muscle hypertonicity     Problem List Patient Active Problem List   Diagnosis Date Noted  . Quadriplegia, unspecified ([place]) 12/24/2016  . Spastic paresis 09/11/2016  . Multiple sclerosis ([place]) 09/11/2016  . Muscle spasticity 10/31/2013  . Nerve root pain 01/31/2013  . Cervical myelopathy ([place]) 05/05/2011  . BP (high blood pressure) 05/05/2011  . Restless leg 05/05/2011  . Arthralgia, sacroiliac 05/05/2011   PHYSICAL THERAPY DISCHARGE SUMMARY  Visits from Start of Care: 18  Current functional level related to goals / functional outcomes: See goals above.   Remaining deficits: Chronic deficits related to MS.   Education / Equipment: [employer] trader, home program  Plan: Patient agrees to discharge.  Patient goals were met. Patient is being discharged due to meeting the stated rehab goals.  ?????     Florella Mcneese, PT 10/15/2018, 3:39 PM  [facility] [address] [place], [place], [address] Phone: [phone]   Fax:  [phone]  Name: Amy Bray MRN: [id number] Date of Birth: [birth year]

## 2018-10-15 NOTE — Addendum Note (Signed)
Addended by: Rudell Cobb M on: 10/15/2018 03:52 PM   Modules accepted: Orders

## 2018-10-17 ENCOUNTER — Other Ambulatory Visit: Payer: Self-pay | Admitting: Neurology

## 2018-10-18 ENCOUNTER — Other Ambulatory Visit: Payer: Self-pay

## 2018-10-18 ENCOUNTER — Ambulatory Visit (INDEPENDENT_AMBULATORY_CARE_PROVIDER_SITE_OTHER): Payer: Medicare Other | Admitting: Neurology

## 2018-10-18 ENCOUNTER — Encounter: Payer: Self-pay | Admitting: Neurology

## 2018-10-18 ENCOUNTER — Ambulatory Visit: Payer: Medicare Other | Admitting: Neurology

## 2018-10-18 VITALS — BP 105/67 | HR 99 | Temp 97.6°F | Ht 61.0 in | Wt 109.0 lb

## 2018-10-18 DIAGNOSIS — G35 Multiple sclerosis: Secondary | ICD-10-CM | POA: Diagnosis not present

## 2018-10-18 DIAGNOSIS — G8220 Paraplegia, unspecified: Secondary | ICD-10-CM

## 2018-10-18 MED ORDER — BACLOFEN 10 MG PO TABS: 20.0000 mg | ORAL_TABLET | Freq: Three times a day (TID) | ORAL | 6 refills | Status: DC

## 2018-10-18 NOTE — Progress Notes (Signed)
**  [employer] 100 units x 5 vials, NDC 0259-1610-01, Lot [id number], Exp 09/2020, office supply.//mck,rn**

## 2018-10-18 NOTE — Progress Notes (Signed)
PATIENT: Amy Bray DOB: [birth year]    HISTORICAL  Amy Bray is a 73 years old right-handed female , seen in refer by my colleague Dr. Leta Baptist for evaluation of EMG guided botulism toxin injection for bilateral lower extremity spasticity, gait abnormality, initial evaluation was on September 11 2016.   This is the first time I have seen this patient, I also performed electrical stimulation guided xeomin injection for spastic paraplegia, I used 400 units of xeomin on September 11 2016   She presented with right leg stiffness, spasm, weakness in 2002, was diagnosed with cervical spine disease, had anterior cervical vasectomy and fusion in 2003. Following the surgery, her right leg weakness and spasms significantly improved, however a year later the symptoms returned, she ultimately had second cervical spine surgery in 2006, following second surgery, she only has mild improvement of her symptoms,  Over the years, she continue have progressive worsening spastic bilateral lower extremity, gait abnormality, eventually leading to this diagnosis of relapsing remitting multiple sclerosis,  She was treated with [facility] over the past few years, her symptoms has been steady.  We have personally reviewed most recent MRI cervical spine in September 2017, 3 small T2 hyper intensity foci within the spinal cord, adjacent to C5-6 to the left, adjacent to C6-7 to the right, and adjacent to T2, no change compared to previous scan in 2015, evidence of surgical fusion from C3-C6.  MRI of brain showed few scattered T2/FLAIR hyperintensity foci in the subcortical deep matter of the frontal area,  Since Tysarbri infusion, her symptoms overall has been steady, but over the years, she noticed increased bilateral lower extremity muscle spasticity, especially at nighttime, she described her left joint worsens stomach, jerking movement, while ambulating, she noticed bilateral knees rubbing towards each other,  bilateral leg tends to "worse each other. She rely on her walker now.  Previously she had EMG guided Botox injection at [facility] for bilateral lower extremity spasticity  with very good result  UPDATE Dec 5th 2018: Responded very well to previous injection September 11, 2016, there was no significant side effect noticed, we used 400 units of xeomin, today she wants to emphasize on left lower extremity spasm, hip flexion,  UPDATE March 23 2017: She responded very well to previous xeomin injection 400 units to bilateral lower extremity, we used 600 units total, with emphasized on bilateral hip adduction, flexion  UPDATE June 24 2017: We used 500 units xeomin today, she denies significant side effect with 600 units last time, insurance maximum 500 units  UPDATE Sept 11 2019: She responded well to previous injection but the benefit only last about 3 months, at the end of the 3 months, she developed increased spasticity of bilateral leg, adduction of bilateral leg  UPDATE July 12 2018: She responded well to previous injection on March 31 2018.  There was no significant side effect noticed, noticed recurrent bilateral lower extremity at the end of injection cycle  UPDATE Sept 30 2020: She did not responded well to previous injections on June, complains of forceful bilateral lower extremity adduction  REVIEW OF SYSTEMS: Full 14 system review of systems performed and notable only for gait abnormality, back pain   ALLERGIES: No Known Allergies  HOME MEDICATIONS: Current Outpatient Medications  Medication Sig Dispense Refill  . acetaminophen (TYLENOL) 500 MG tablet Take 500 mg by mouth as needed.    . baclofen (LIORESAL) 10 MG tablet Take 2 tablets (20 mg total) by mouth 3 (three) times  daily. 180 tablet 6  . Cranberry-Vitamin C-Vitamin E (CRANBERRY PLUS VITAMIN C) 4200-20-3 MG-MG-UNIT CAPS Take by mouth.    . Dalfampridine (4-AMINOPYRIDINE) POWD 5 mg by Does not apply route 3 (three) times daily. 5mg   capsules, one tid    . denosumab (PROLIA) 60 MG/ML SOLN injection Inject 60 mg into the skin every 6 (six) months. Administer in upper arm, thigh, or abdomen    . incobotulinumtoxinA (XEOMIN) 100 units SOLR injection Inject 500 Units into the muscle every 3 (three) months.    Marland Kitchen lisinopril (PRINIVIL,ZESTRIL) 10 MG tablet Take 1 tablet by mouth daily.    . natalizumab (TYSABRI) 300 MG/15ML injection Inject into the vein.    Marland Kitchen nortriptyline (PAMELOR) 50 MG capsule Take 1 capsule by mouth daily.    Marland Kitchen oxybutynin (DITROPAN-XL) 5 MG 24 hr tablet Take 5 mg by mouth daily.    . traMADol-acetaminophen (ULTRACET) 37.5-325 MG per tablet Take 1 tablet by mouth daily as needed.     No current facility-administered medications for this visit.     PAST MEDICAL HISTORY: Past Medical History:  Diagnosis Date  . Cervical myelopathy ([place])   . Multiple sclerosis ([place])   . Osteoporosis 2018   hip    PAST SURGICAL HISTORY: Past Surgical History:  Procedure Laterality Date  . CERVICAL FUSION  01/2001, 01/2004   C3-C6    FAMILY HISTORY: Family History  Problem Relation Age of Onset  . Transient ischemic attack Mother   . Pancreatic cancer Father   . Breast cancer Cousin     SOCIAL HISTORY:  Social History   Socioeconomic History  . Marital status: Married    Spouse name: Amy Bray  . Number of children: 0  . Years of education: College  . Highest education level: Not on file  Occupational History    Employer: OTHER    Comment: n/a  Social Needs  . Financial resource strain: Not on file  . Food insecurity    Worry: Not on file    Inability: Not on file  . Transportation needs    Medical: Not on file    Non-medical: Not on file  Tobacco Use  . Smoking status: Former Smoker    Packs/day: 1.00    Years: 15.00    Pack years: 15.00    Types: Cigarettes    Quit date: 01/21/1983    Years since quitting: 35.7  . Smokeless tobacco: Never Used  Substance and Sexual Activity  . Alcohol  use: Yes    Alcohol/week: 0.0 standard drinks    Comment: occasionally- wine  . Drug use: No  . Sexual activity: Not on file  Lifestyle  . Physical activity    Days per week: Not on file    Minutes per session: Not on file  . Stress: Not on file  Relationships  . Social Herbalist on phone: Not on file    Gets together: Not on file    Attends religious service: Not on file    Active member of club or organization: Not on file    Attends meetings of clubs or organizations: Not on file    Relationship status: Not on file  . Intimate partner violence    Fear of current or ex partner: Not on file    Emotionally abused: Not on file    Physically abused: Not on file    Forced sexual activity: Not on file  Other Topics Concern  . Not on file  Social History Narrative   Patient lives at home with her spouse.   Caffeine Use-none     PHYSICAL EXAM   Vitals:   10/18/18 1000  BP: 105/67  Pulse: 99  Temp: 97.6 F (36.4 C)  Weight: 109 lb (49.4 kg)  Height: 5\' 1"  (1.549 m)    Not recorded      Body mass index is 20.6 kg/m.  PHYSICAL EXAMNIATION:  MOTOR: She has moderate spasticity of bilateral lower extremity, mild to moderate proximal lower extremity weakness,    GAIT/STANCE: She rely on her walker to get up from seated position, significant bilateral adductor muscle spasm, rubbing her knees together while walking, spastic, unsteady slow cautious gait, bilateral flat feet, difficulty to clear bilateral feet from the floor  DIAGNOSTIC DATA (LABS, IMAGING, TESTING)  - I reviewed patient records, labs, notes, testing and imaging myself where available.   ASSESSMENT AND PLAN  Amy Bray is a 73 y.o. female   Spastic paraparesis.  Relapsing remitting multiple sclerosis with cervical cord involvement, history of spondylitic cervical myelopathy   Under electric stimulation, we have performed xeomin injection today use 500 units  Right adductor longus  150 Right adductor magnus 100 units    Left adductor longus 150 units Left adductor magnus 100 units   Patient tolerate the injection well, returned to 3 months for repeat injection   Marcial Pacas, M.D. Ph.D.  [facility] [address] [place], [place] [address] Ph: [phone] Fax: [phone]  CC: Hulan Fess, MD

## 2018-10-20 ENCOUNTER — Ambulatory Visit: Payer: Medicare Other | Admitting: Physical Therapy

## 2018-10-20 DIAGNOSIS — G8220 Paraplegia, unspecified: Secondary | ICD-10-CM | POA: Diagnosis not present

## 2018-10-20 DIAGNOSIS — G35 Multiple sclerosis: Secondary | ICD-10-CM

## 2018-10-20 MED ORDER — INCOBOTULINUMTOXINA 100 UNITS IM SOLR
500.0000 [IU] | INTRAMUSCULAR | Status: DC
  Administered 2018-10-20: 17:00:00 500 [IU] via INTRAMUSCULAR

## 2018-10-22 ENCOUNTER — Ambulatory Visit: Payer: Medicare Other | Admitting: Rehabilitative and Restorative Service Providers"

## 2018-11-04 ENCOUNTER — Ambulatory Visit: Payer: Medicare Other

## 2018-11-08 ENCOUNTER — Ambulatory Visit (INDEPENDENT_AMBULATORY_CARE_PROVIDER_SITE_OTHER): Payer: Medicare Other | Admitting: Diagnostic Neuroimaging

## 2018-11-08 ENCOUNTER — Encounter: Payer: Self-pay | Admitting: Diagnostic Neuroimaging

## 2018-11-08 ENCOUNTER — Other Ambulatory Visit: Payer: Self-pay

## 2018-11-08 VITALS — BP 129/85 | HR 104 | Temp 97.8°F | Ht 61.0 in | Wt 103.0 lb

## 2018-11-08 DIAGNOSIS — G35 Multiple sclerosis: Secondary | ICD-10-CM

## 2018-11-08 DIAGNOSIS — R159 Full incontinence of feces: Secondary | ICD-10-CM | POA: Diagnosis not present

## 2018-11-08 DIAGNOSIS — G8250 Quadriplegia, unspecified: Secondary | ICD-10-CM

## 2018-11-08 DIAGNOSIS — R29898 Other symptoms and signs involving the musculoskeletal system: Secondary | ICD-10-CM

## 2018-11-08 DIAGNOSIS — G8220 Paraplegia, unspecified: Secondary | ICD-10-CM

## 2018-11-08 MED ORDER — BACLOFEN 10 MG PO TABS: 20.0000 mg | ORAL_TABLET | Freq: Three times a day (TID) | ORAL | 6 refills | Status: DC

## 2018-11-08 NOTE — Progress Notes (Signed)
JCV blood specimen placed in [facility] lock box for pick up, 4:42 pm.

## 2018-11-08 NOTE — Addendum Note (Signed)
Addended by: Inis Sizer D on: 11/08/2018 04:03 PM   Modules accepted: Orders

## 2018-11-08 NOTE — Progress Notes (Signed)
[facility]  PATIENT: Amy Bray DOB: [birth year]  REFERRING CLINICIAN:  HISTORY FROM: patient REASON FOR VISIT: follow up   HISTORICAL  CHIEF COMPLAINT:  Chief Complaint  Patient presents with   Multiple Sclerosis    rm 6, 6 month FU, "Tysabri infusions going well; going to pool 2 days/week, walking improved but no improvement in speed"    HISTORY OF PRESENT ILLNESS:   UPDATE (11/08/18, VRP): Since last visit, doing about the same. MS symptoms are stable. Severity moderate. No alleviating or aggravating factors. Tolerating meds. Now more caregiver stress, as her husband has progressive dementia.   UPDATE (12/07/17, VRP): Since last visit, now with swelling in feet x 2 months. Improved in last 1-2 weeks. No pain. No triggers. Had 1 day with some addl leg weakness (1 hour) then resolved on 11/29/17. Symptoms are now at baseline. Continues PT and water therapy. Tolerating tysabri.    UPDATE (08/10/17, VRP): Since last visit, doing well. Tolerating tysabri and generic amprya. No alleviating or aggravating factors. Hot flashes improved. Fatigue improved.   UPDATE (02/11/17, VRP): Since last visit, doing well. Tolerating meds. No alleviating or aggravating factors. Now on xeomin (Aug and Dec 2018) and spasticity improved. On generic ampyra and doing well.   UPDATE 08/11/16: Since last visit, doing well. Tolerating tysabri and baclofen. Heat has been challenging with spasms. Gait diff with knees rubbing together, slightly worse.  UPDATE 02/11/16: Since last visit, balance diff continues. No flares. MRIs stable. Tolerating tysabri.   UPDATE 10/08/15: Since last visit, pt feels some progression of gait diff and walking problems. No specific attack or flare up.  UPDATE 07/04/15: Since last visit, doing well. Tolerating tysabri and ampyra.  UPDATE 04/02/15: Since last visit, doing well. Tolerating tysabri and ampyra. Back on nortriptyline (is helping burning pain in  feet).   UPDATE 01/03/15: Since last visit, doing better on ampyra (better gait and walking speed). More foot/calf tingling burning pain x 1-2 months. Now with Dr. Andree Elk pain mgmt for back injections x 2, which is helping.  UPDATE 10/02/14: Since last visit, doing well. Requesting transition of care to [place] for pain mgmt; previously at [facility] with Dr. Oliva Bustard.  UPDATE 05/22/14: Since last visit, doing well. On tysabri since March 2016. Fatigue and bladder control much improved. Gait is stable.   UPDATE 02/20/14: Since last visit, had reviewed lab results and now ready to start MS medication. Now would like to start tysabri.  UPDATE 01/09/14: Since last visit, no new events symptoms. LP results reviewed. Didn't get lab work at last visit for MS mimics.   UPDATE 12/07/13: Patient is stable with symptoms. MRI scans reviewed. Continues to have weakness in legs and balance difficulty.  UPDATE 11/21/13: 73 year old right-handed female here for evaluation of lower extremity spasms and weakness. 2002 patient developed onset of right leg stiffness, spasm, weakness. Patient was diagnosed with cervical spine disease, bone spur ridging, and treated with anterior cervical discectomy and fusion in 2003. Following this her right leg weakness and spasms significantly improved. However one year later her symptoms return. Patient followed up with several surgeons and ultimately had a second cervical spine surgery in 2006. Following this patient had slight improvement of right leg however within several months her weakness, spasms and gait continued to decline. Patient transitioned needing a cane to walk. 2013 patient was evaluated by a neurologist in [place], to evaluate her right leg weakness. Apparently patient had a "multiple sclerosis" workup including MRI of the  brain and extensive blood testing. Patient also had EMG nerve conduction studies looking for peroneal neuropathy but apparently all studies were normal.  MRI of the brain did show a right medulla lesion which was reported as a stroke. Over the past 1 year patient has developed new onset left leg weakness, spasm, stiffness. Patient followed up with orthopedic surgery who then referred patient to me for evaluation. Last MRI of cervical spine was in 2006 at [facility]. No bowel or bladder incontinence. No significant numbness or tingling. No weakness in upper extremities. No vision loss, vision changes, slurred speech or trouble talking.   REVIEW OF SYSTEMS: Full 14 system review of systems performed and negative except: as per HPI   ALLERGIES: No Known Allergies  HOME MEDICATIONS: Outpatient Medications Prior to Visit  Medication Sig Dispense Refill   acetaminophen (TYLENOL) 500 MG tablet Take 500 mg by mouth as needed.     baclofen (LIORESAL) 10 MG tablet Take 2 tablets (20 mg total) by mouth 3 (three) times daily. 180 tablet 6   Cranberry-Vitamin C-Vitamin E (CRANBERRY PLUS VITAMIN C) 4200-20-3 MG-MG-UNIT CAPS Take by mouth.     Dalfampridine (4-AMINOPYRIDINE) POWD 5 mg by Does not apply route 3 (three) times daily. 5mg  capsules, one tid     denosumab (PROLIA) 60 MG/ML SOLN injection Inject 60 mg into the skin every 6 (six) months. Administer in upper arm, thigh, or abdomen     incobotulinumtoxinA (XEOMIN) 100 units SOLR injection Inject 500 Units into the muscle every 3 (three) months.     lisinopril (PRINIVIL,ZESTRIL) 10 MG tablet Take 1 tablet by mouth daily.     natalizumab (TYSABRI) 300 MG/15ML injection Inject into the vein.     nortriptyline (PAMELOR) 50 MG capsule Take 1 capsule by mouth daily.     oxybutynin (DITROPAN-XL) 5 MG 24 hr tablet Take 5 mg by mouth daily.     traMADol-acetaminophen (ULTRACET) 37.5-325 MG per tablet Take 1 tablet by mouth daily as needed.     Facility-Administered Medications Prior to Visit  Medication Dose Route Frequency Provider Last Rate Last Dose   incobotulinumtoxinA (XEOMIN) 100 units injection  500 Units  500 Units Intramuscular Q90 days Marcial Pacas, MD   500 Units at 10/20/18 1631    PAST MEDICAL HISTORY: Past Medical History:  Diagnosis Date   Cervical myelopathy ([place])    Multiple sclerosis ([place])    Osteoporosis 2018   hip    PAST SURGICAL HISTORY: Past Surgical History:  Procedure Laterality Date   CERVICAL FUSION  01/2001, 01/2004   C3-C6    FAMILY HISTORY: Family History  Problem Relation Age of Onset   Transient ischemic attack Mother    Pancreatic cancer Father    Breast cancer Cousin     SOCIAL HISTORY:  Social History   Socioeconomic History   Marital status: Married    Spouse name: Daphnee Thorp   Number of children: 0   Years of education: College   Highest education level: Not on file  Occupational History    Employer: OTHER    Comment: n/a  Scientist, product/process development strain: Not on file   Food insecurity    Worry: Not on file    Inability: Not on file   Transportation needs    Medical: Not on file    Non-medical: Not on file  Tobacco Use   Smoking status: Former Smoker    Packs/day: 1.00    Years: 15.00    Pack years: 15.00  Types: Cigarettes    Quit date: 01/21/1983    Years since quitting: 35.8   Smokeless tobacco: Never Used  Substance and Sexual Activity   Alcohol use: Yes    Alcohol/week: 0.0 standard drinks    Comment: occasionally- wine   Drug use: No   Sexual activity: Not on file  Lifestyle   Physical activity    Days per week: Not on file    Minutes per session: Not on file   Stress: Not on file  Relationships   Social connections    Talks on phone: Not on file    Gets together: Not on file    Attends religious service: Not on file    Active member of club or organization: Not on file    Attends meetings of clubs or organizations: Not on file    Relationship status: Not on file   Intimate partner violence    Fear of current or ex partner: Not on file    Emotionally abused: Not on  file    Physically abused: Not on file    Forced sexual activity: Not on file  Other Topics Concern   Not on file  Social History Narrative   Patient lives at home with her spouse, who has dementia.   Caffeine Use-none     PHYSICAL EXAM  GENERAL EXAM/CONSTITUTIONAL: Vitals:  Vitals:   11/08/18 1505 11/08/18 1520  BP: 130/90 129/85  Pulse: (!) 110 (!) 104  Temp: 97.8 F (36.6 C)   Weight: 103 lb (46.7 kg)   Height: 5\' 1"  (1.549 m)    Body mass index is 19.46 kg/m. Wt Readings from Last 3 Encounters:  11/08/18 103 lb (46.7 kg)  10/18/18 109 lb (49.4 kg)  07/12/18 108 lb (49 kg)    Patient is in no distress; well developed, nourished and groomed; neck is supple  CARDIOVASCULAR:  Examination of carotid arteries is normal; no carotid bruits  Regular rate and rhythm, no murmurs  Examination of peripheral vascular system by observation and palpation is normal  EYES:  Ophthalmoscopic exam of optic discs and posterior segments is normal; no papilledema or hemorrhages No exam data present  MUSCULOSKELETAL:  Gait, strength, tone, movements noted in Neurologic exam below  NEUROLOGIC: MENTAL STATUS:  No flowsheet data found.  awake, alert, oriented to person, place and time  recent and remote memory intact  normal attention and concentration  language fluent, comprehension intact, naming intact,   fund of knowledge appropriate  CRANIAL NERVE:   2nd - no papilledema on fundoscopic exam  2nd, 3rd, 4th, 6th - pupils equal and reactive to light, visual fields full to confrontation, extraocular muscles intact, no nystagmus  5th - facial sensation symmetric  7th - facial strength symmetric  8th - hearing intact  9th - palate elevates symmetrically, uvula midline  11th - shoulder shrug symmetric  12th - tongue protrusion midline  MOTOR:   normal bulk and tone, full strength in the BUE; EXCEPT MILD INCREASED EXT TONE IN BLE; BLE (HF 3, KE 5 KF 4 DF  3)  SENSORY:   normal and symmetric to light touch, temperature; DECR AT TOES  COORDINATION:  finger-nose-finger, fine finger movements normal  REFLEXES:   deep tendon reflexes present and symmetric; EXCEPT BUE 3; BLE (HIP FLEXION 3; KNEES 3; ANKLES 2)  GAIT/STATION:   UNSTEADY, SPASTIC, PARAPARETIC GAIT; USES ROLLATOR WALKER     DIAGNOSTIC DATA (LABS, IMAGING, TESTING) - I reviewed patient records, labs, notes, testing and imaging myself  where available.  Lab Results  Component Value Date   WBC 6.1 05/05/2018   HGB 11.2 05/05/2018   HCT 33.6 (L) 05/05/2018   MCV 94 05/05/2018   PLT 285 05/05/2018      Component Value Date/Time   NA 144 05/05/2018 1558   K 3.6 05/05/2018 1558   CL 103 05/05/2018 1558   CO2 24 05/05/2018 1558   GLUCOSE 77 05/05/2018 1558   BUN 21 05/05/2018 1558   CREATININE 0.60 05/05/2018 1558   CALCIUM 9.0 05/05/2018 1558   PROT 5.9 (L) 05/05/2018 1558   ALBUMIN 4.5 05/05/2018 1558   AST 14 05/05/2018 1558   ALT 13 05/05/2018 1558   ALKPHOS 31 (L) 05/05/2018 1558   BILITOT 0.3 05/05/2018 1558   GFRNONAA 92 05/05/2018 1558   GFRAA 106 05/05/2018 1558   No results found for: CHOL, HDL, LDLCALC, LDLDIRECT, TRIG, CHOLHDL No results found for: HGBA1C No results found for: VITAMINB12 No results found for: TSH  Vit D, 25-Hydroxy  Date Value Ref Range Status  01/03/2015 36.8 30.0 - 100.0 ng/mL Final    Comment:    Vitamin D deficiency has been defined by the Institute of Medicine and an Endocrine Society practice guideline as a level of serum 25-OH vitamin D less than 20 ng/mL (1,2). The Endocrine Society went on to further define vitamin D insufficiency as a level between 21 and 29 ng/mL (2). 1. IOM (Institute of Medicine). 2010. Dietary reference    intakes for calcium and D. [place]: The    [employer]. 2. Holick MF, [place], Bischoff-Ferrari HA, et al.    Evaluation, treatment, and prevention of vitamin D     deficiency: an Endocrine Society clinical practice    guideline. JCEM. 2011 Jul; 96(7):1911-30.     I reviewed images myself. In my review, I think there is a 2cm spinal cord lesion (right side, C6-C7) and a smaller left T1 lesion, suspicious for demyelinating disease, on the MRI cervical spine from 10/08/03. No adjacent disc or bone degeneration or spinal stenosis. The MRI report states that the cord appears normal. -VRP  10/08/03 MRI cervical spine (images reviewed) - Satisfactory anterior plate fusion of C4 and C5.Disk degeneration and spondylosis at C3-4 and C5-6 with biforaminal narrowing but no significant central canal stenosis. There is no disk protrusion.   12/29/13 LP - WBC 1, RBC 0, protein 26, glucose 59, OCB > 5, gram stain neg  04/26/17 MRI of the brain with and without contrast [I reviewed images myself and agree with interpretation. 1 small new lesion. -VRP]  1.   Small left occipital subcortical T2/FLAIR hyperintense focus with subtle hyperintensity on diffusion-weighted images.  This focus was not present on the 08/24/2016 MRI.  The etiology is uncertain but it could represent a sub-chronic focus of chronic ischemic or demyelinating change.   2.   There are a couple T2/FLAIR hyperintense foci in the subcortical and deep white matter.  This is a nonspecific finding and could be incidental at this age, will be due to minimal chronic microvascular ischemic change or demyelination.  None of the foci appears to be acute and there is no change when compared to the 08/24/2016 MRI. 3.   Mild generalized cortical atrophy, typical for age. 4.   There is a normal enhancement pattern.  04/26/17 MRI of the cervical spine with and without contrast [I reviewed images myself and agree with interpretation. Stable since 2017.  -VRP]  1.   There are  3 small T2/FLAIR hyperintense foci within the spinal cord adjacent to C6, C7 and T2.  All of these were present on the 10/19/2015 MRI.  None of these  enhance. 2.   Multilevel degenerative changes as detailed above.   The degenerative changes at C6-C7 have slightly progressed when compared to the previous MRI.  There did not appear to be any nerve root compression. 3.   ACDF from C3 through C6.   4.   There is a normal enhancement pattern of there are no acute findings.  04/26/17 Unremarkable MRI thoracic spine (with and without). [I reviewed images myself and agree with interpretation. -VRP]   05/19/18 MRI cervical spine (with and without) demonstrating: - Small chronic plaques at C6 on the left and C7 on the right; no acute plaques - No new plaques from MRI on 04/26/2017; prior T2 level plaque is not well visualized on current study  05/19/18 MRI thoracic spine (with and without) demonstrating:  - subtle T2 hyperintensity at C7 level on sagittal views; consistent with chronic plaque - no acute plaques; remainder of spinal cord is also unremarkable   Anti-JCV antibody 01/10/14 - 0.10 negative  05/22/14 - 0.09 negative 12/2014 - negative March 2017 - negative 10/09/15 - 0.13 negative 02/11/16 JCV antibody 0.11 negative 08-13-16 JCV antibody 0.11 negative sy 03/05/17 JCV negative 0.07 08-15-17 JCV negative, index 0.08    ASSESSMENT AND PLAN  73 y.o. year old female here with  bilateral lower extremity spasticity, weakness,with brisk reflexes in upper and lower extremities, left Hoffman sign, bilateral upgoing toes. MRIs from 2005 and 2015 demonstrate multiple spinal cord lesions. Now suspect demyelinating disease, with first attack in 2002, second attack in 2003, and 3rd attack in 2014.  Then with some progression of symptoms since mid-2017.  Some worsening balance since Jan 2019.    Dx: multiple sclerosis   Multiple sclerosis ([place])  Spinal paraparesis ([facility])  Quadriplegia, unspecified ([place])  Incontinence of feces, unspecified fecal incontinence type  Hand weakness    PLAN:  MULTIPLE SCLEROSIS (RRMS; ? Secondary progression by  symptoms) - continue tysabri; may consider switch to ocrevus - continue dalfampridine; walking speed and balance are better with medcation (T25W 46 --> 25s) - continue PT - check labs and MRI brain  BOWEL INCONTINENCE - schedule bathroom breaks; continue fiber supplement  CAREGIVER STRESS - husband has dementia  MUSCLE SPASMS (stable) - continue baclofen - continue chemodenervation (per Dr. [name])  Orders Placed This Encounter  Procedures   MR BRAIN W WO CONTRAST   CBC with Differential/Platelet   Comprehensive metabolic panel   Stratify JCV(TM) Ab w/Index   Return in about 6 months (around 05/09/2019).     Penni Bombard, MD [id number], [id number] PM Certified in Neurology, Neurophysiology and Neuroimaging  [facility] [address], [place], [place] [address] [phone]

## 2018-11-09 ENCOUNTER — Telehealth: Payer: Self-pay | Admitting: Diagnostic Neuroimaging

## 2018-11-09 NOTE — Telephone Encounter (Signed)
Medicare/aarp order sent to [facility]. No auth they will reach out to the patient to schedule.  

## 2018-11-16 ENCOUNTER — Telehealth: Payer: Self-pay | Admitting: *Deleted

## 2018-11-16 NOTE — Telephone Encounter (Signed)
Received fax for refill on Aminopyridine 5 mg caps. Rx completed, signed, faxed back to [facility], f [phone].

## 2018-11-17 NOTE — Telephone Encounter (Signed)
Patient was in office for infusion. I advised her that her labs are stable, unremarkable, JCV pending. Patient verbalized understanding, appreciation.

## 2018-11-24 ENCOUNTER — Encounter: Payer: Self-pay | Admitting: *Deleted

## 2018-11-24 ENCOUNTER — Telehealth: Payer: Self-pay

## 2018-11-24 LAB — COMPREHENSIVE METABOLIC PANEL
ALT: 18 IU/L (ref 0–32)
AST: 19 IU/L (ref 0–40)
Albumin/Globulin Ratio: 2.3 — ABNORMAL HIGH (ref 1.2–2.2)
Albumin: 4.5 g/dL (ref 3.7–4.7)
Alkaline Phosphatase: 38 IU/L — ABNORMAL LOW (ref 39–117)
BUN/Creatinine Ratio: 26 (ref 12–28)
BUN: 18 mg/dL (ref 8–27)
Bilirubin Total: 0.2 mg/dL (ref 0.0–1.2)
CO2: 21 mmol/L (ref 20–29)
Calcium: 8.8 mg/dL (ref 8.7–10.3)
Chloride: 105 mmol/L (ref 96–106)
Creatinine, Ser: 0.69 mg/dL (ref 0.57–1.00)
GFR calc Af Amer: 101 mL/min/{1.73_m2} (ref >59)
GFR calc non Af Amer: 88 mL/min/{1.73_m2} (ref >59)
Globulin, Total: 2 g/dL (ref 1.5–4.5)
Glucose: 81 mg/dL (ref 65–99)
Potassium: 4.2 mmol/L (ref 3.5–5.2)
Sodium: 142 mmol/L (ref 134–144)
Total Protein: 6.5 g/dL (ref 6.0–8.5)

## 2018-11-24 LAB — STRATIFY JCV(TM) AB W/INDEX
JCV Antibody: NEGATIVE
JCV Index Value: 0.19

## 2018-11-24 LAB — CBC WITH DIFFERENTIAL/PLATELET
Basophils Absolute: 0 10*3/uL (ref 0.0–0.2)
Basos: 1 %
EOS (ABSOLUTE): 0.1 10*3/uL (ref 0.0–0.4)
Eos: 2 %
Hematocrit: 38.7 % (ref 34.0–46.6)
Hemoglobin: 12.4 g/dL (ref 11.1–15.9)
Immature Grans (Abs): 0 10*3/uL (ref 0.0–0.1)
Immature Granulocytes: 1 %
Lymphocytes Absolute: 2.4 10*3/uL (ref 0.7–3.1)
Lymphs: 38 %
MCH: 31.2 pg (ref 26.6–33.0)
MCHC: 32 g/dL (ref 31.5–35.7)
MCV: 98 fL — ABNORMAL HIGH (ref 79–97)
Monocytes Absolute: 0.7 10*3/uL (ref 0.1–0.9)
Monocytes: 11 %
NRBC: 1 % — ABNORMAL HIGH (ref 0–0)
Neutrophils Absolute: 3.1 10*3/uL (ref 1.4–7.0)
Neutrophils: 47 %
Platelets: 284 10*3/uL (ref 150–450)
RBC: 3.97 x10E6/uL (ref 3.77–5.28)
RDW: 12.2 % (ref 11.7–15.4)
WBC: 6.4 10*3/uL (ref 3.4–10.8)

## 2018-11-24 NOTE — Telephone Encounter (Signed)
JCV results from 11/08/2018 have been received.  Index value of 0.15 and JCV antibody was negative.

## 2018-11-24 NOTE — Telephone Encounter (Signed)
Sent patient my chart to advise her of JCV result.

## 2018-11-28 ENCOUNTER — Other Ambulatory Visit: Payer: Self-pay

## 2018-11-28 ENCOUNTER — Ambulatory Visit
Admission: RE | Admit: 2018-11-28 | Discharge: 2018-11-28 | Disposition: A | Discharge status: Home / Self Care | Payer: Medicare Other | Source: Ambulatory Visit | Attending: Diagnostic Neuroimaging | Admitting: Diagnostic Neuroimaging

## 2018-11-28 DIAGNOSIS — G35 Multiple sclerosis: Secondary | ICD-10-CM

## 2018-11-28 MED ORDER — GADOBENATE DIMEGLUMINE 529 MG/ML IV SOLN
9.0000 mL | Freq: Once | INTRAVENOUS | Status: AC | PRN
  Administered 2018-11-28: 9 mL via INTRAVENOUS

## 2018-12-06 ENCOUNTER — Telehealth: Payer: Self-pay | Admitting: *Deleted

## 2018-12-06 NOTE — Telephone Encounter (Signed)
LVM informed patient her MRI brain results are unremarkable, no new lesions, unchanged from MRI April 2019. Left # for questions.

## 2018-12-08 ENCOUNTER — Ambulatory Visit
Admission: RE | Admit: 2018-12-08 | Discharge: 2018-12-08 | Disposition: A | Discharge status: Home / Self Care | Payer: Medicare Other | Source: Ambulatory Visit | Attending: Family Medicine | Admitting: Family Medicine

## 2018-12-08 ENCOUNTER — Other Ambulatory Visit: Payer: Self-pay

## 2018-12-08 DIAGNOSIS — Z1231 Encounter for screening mammogram for malignant neoplasm of breast: Secondary | ICD-10-CM

## 2019-01-18 ENCOUNTER — Encounter: Payer: Self-pay | Admitting: Neurology

## 2019-01-18 ENCOUNTER — Ambulatory Visit (INDEPENDENT_AMBULATORY_CARE_PROVIDER_SITE_OTHER): Payer: Medicare Other | Admitting: Neurology

## 2019-01-18 ENCOUNTER — Other Ambulatory Visit: Payer: Self-pay

## 2019-01-18 VITALS — BP 123/73 | HR 97 | Temp 96.4°F | Ht 61.0 in | Wt 108.0 lb

## 2019-01-18 DIAGNOSIS — G8220 Paraplegia, unspecified: Secondary | ICD-10-CM | POA: Diagnosis not present

## 2019-01-18 DIAGNOSIS — G35 Multiple sclerosis: Secondary | ICD-10-CM | POA: Diagnosis not present

## 2019-01-18 NOTE — Progress Notes (Signed)
**  Xeomin 100 units x 5 vials, NDC 0259-1610-01, Lot [id number], Exp 10/2020, office supply.//mck,rn**

## 2019-01-19 DIAGNOSIS — G8220 Paraplegia, unspecified: Secondary | ICD-10-CM | POA: Diagnosis not present

## 2019-01-19 DIAGNOSIS — G35 Multiple sclerosis: Secondary | ICD-10-CM | POA: Diagnosis not present

## 2019-01-19 MED ORDER — INCOBOTULINUMTOXINA 100 UNITS IM SOLR
500.0000 [IU] | INTRAMUSCULAR | Status: DC
  Administered 2019-01-19: 10:00:00 500 [IU] via INTRAMUSCULAR

## 2019-01-19 NOTE — Progress Notes (Signed)
PATIENT: Amy Bray DOB: [birth year]    HISTORICAL  HILIANA POITRA is a 73 years old right-handed female , seen in refer by my colleague Dr. Leta Baptist for evaluation of EMG guided botulism toxin injection for bilateral lower extremity spasticity, gait abnormality, initial evaluation was on September 11 2016.   This is the first time I have seen this patient, I also performed electrical stimulation guided xeomin injection for spastic paraplegia, I used 400 units of xeomin on September 11 2016   She presented with right leg stiffness, spasm, weakness in 2002, was diagnosed with cervical spine disease, had anterior cervical vasectomy and fusion in 2003. Following the surgery, her right leg weakness and spasms significantly improved, however a year later the symptoms returned, she ultimately had second cervical spine surgery in 2006, following second surgery, she only has mild improvement of her symptoms,  Over the years, she continue have progressive worsening spastic bilateral lower extremity, gait abnormality, eventually leading to this diagnosis of relapsing remitting multiple sclerosis,  She was treated with Dwyane Dee over the past few years, her symptoms has been steady.  We have personally reviewed most recent MRI cervical spine in September 2017, 3 small T2 hyper intensity foci within the spinal cord, adjacent to C5-6 to the left, adjacent to C6-7 to the right, and adjacent to T2, no change compared to previous scan in 2015, evidence of surgical fusion from C3-C6.  MRI of brain showed few scattered T2/FLAIR hyperintensity foci in the subcortical deep matter of the frontal area,  Since Tysarbri infusion, her symptoms overall has been steady, but over the years, she noticed increased bilateral lower extremity muscle spasticity, especially at nighttime, she described her left joint worsens stomach, jerking movement, while ambulating, she noticed bilateral knees rubbing towards each other,  bilateral leg tends to "worse each other. She rely on her walker now.  Previously she had EMG guided Botox injection at [facility] for bilateral lower extremity spasticity  with very good result  UPDATE Dec 5th 2018: Responded very well to previous injection September 11, 2016, there was no significant side effect noticed, we used 400 units of xeomin, today she wants to emphasize on left lower extremity spasm, hip flexion,  UPDATE March 23 2017: She responded very well to previous xeomin injection 400 units to bilateral lower extremity, we used 600 units total, with emphasized on bilateral hip adduction, flexion  UPDATE June 24 2017: We used 500 units xeomin today, she denies significant side effect with 600 units last time, insurance maximum 500 units  UPDATE Sept 11 2019: She responded well to previous injection but the benefit only last about 3 months, at the end of the 3 months, she developed increased spasticity of bilateral leg, adduction of bilateral leg  UPDATE July 12 2018: She responded well to previous injection on March 31 2018.  There was no significant side effect noticed, noticed recurrent bilateral lower extremity at the end of injection cycle  UPDATE Sept 30 2020: She did not responded well to previous injections on June, complains of forceful bilateral lower extremity adduction  UPDATE Jan 18 2019: She is practicing water aerobic frequently, which has helped her gait, she noticed increased bilateral lower extremity adduction  REVIEW OF SYSTEMS: Full 14 system review of systems performed and notable only for gait abnormality, back pain   ALLERGIES: No Known Allergies  HOME MEDICATIONS: Current Outpatient Medications  Medication Sig Dispense Refill  . acetaminophen (TYLENOL) 500 MG tablet Take 500 mg by  mouth as needed.    . baclofen (LIORESAL) 10 MG tablet Take 2 tablets (20 mg total) by mouth 3 (three) times daily. 180 tablet 6  . Cranberry-Vitamin C-Vitamin E (CRANBERRY  PLUS VITAMIN C) 4200-20-3 MG-MG-UNIT CAPS Take by mouth.    . Dalfampridine (4-AMINOPYRIDINE) POWD 5 mg by Does not apply route 3 (three) times daily. 5mg  capsules, one tid    . denosumab (PROLIA) 60 MG/ML SOLN injection Inject 60 mg into the skin every 6 (six) months. Administer in upper arm, thigh, or abdomen    . incobotulinumtoxinA (XEOMIN) 100 units SOLR injection Inject 500 Units into the muscle every 3 (three) months.    Marland Kitchen lisinopril (PRINIVIL,ZESTRIL) 10 MG tablet Take 1 tablet by mouth daily.    . natalizumab (TYSABRI) 300 MG/15ML injection Inject into the vein.    Marland Kitchen nortriptyline (PAMELOR) 50 MG capsule Take 1 capsule by mouth daily.    Marland Kitchen oxybutynin (DITROPAN-XL) 5 MG 24 hr tablet Take 5 mg by mouth daily.    . traMADol-acetaminophen (ULTRACET) 37.5-325 MG per tablet Take 1 tablet by mouth daily as needed.     No current facility-administered medications for this visit.    PAST MEDICAL HISTORY: Past Medical History:  Diagnosis Date  . Cervical myelopathy ([place])   . Multiple sclerosis ([place])   . Osteoporosis 2018   hip    PAST SURGICAL HISTORY: Past Surgical History:  Procedure Laterality Date  . CERVICAL FUSION  01/2001, 01/2004   C3-C6    FAMILY HISTORY: Family History  Problem Relation Age of Onset  . Transient ischemic attack Mother   . Pancreatic cancer Father   . Breast cancer Cousin     SOCIAL HISTORY:  Social History   Socioeconomic History  . Marital status: Married    Spouse name: Trechelle Beacher  . Number of children: 0  . Years of education: College  . Highest education level: Not on file  Occupational History    Employer: OTHER    Comment: n/a  Tobacco Use  . Smoking status: Former Smoker    Packs/day: 1.00    Years: 15.00    Pack years: 15.00    Types: Cigarettes    Quit date: 01/21/1983    Years since quitting: 36.0  . Smokeless tobacco: Never Used  Substance and Sexual Activity  . Alcohol use: Yes    Alcohol/week: 0.0 standard drinks     Comment: occasionally- wine  . Drug use: No  . Sexual activity: Not on file  Other Topics Concern  . Not on file  Social History Narrative   Patient lives at home with her spouse, who has dementia.   Caffeine Use-none   Social Determinants of Health   Financial Resource Strain:   . Difficulty of Paying Living Expenses: Not on file  Food Insecurity:   . Worried About Charity fundraiser in the Last Year: Not on file  . Ran Out of Food in the Last Year: Not on file  Transportation Needs:   . Lack of Transportation (Medical): Not on file  . Lack of Transportation (Non-Medical): Not on file  Physical Activity:   . Days of Exercise per Week: Not on file  . Minutes of Exercise per Session: Not on file  Stress:   . Feeling of Stress : Not on file  Social Connections:   . Frequency of Communication with Friends and Family: Not on file  . Frequency of Social Gatherings with Friends and Family: Not on file  .  Attends Religious Services: Not on file  . Active Member of Clubs or Organizations: Not on file  . Attends Archivist Meetings: Not on file  . Marital Status: Not on file  Intimate Partner Violence:   . Fear of Current or Ex-Partner: Not on file  . Emotionally Abused: Not on file  . Physically Abused: Not on file  . Sexually Abused: Not on file     PHYSICAL EXAM   Vitals:   01/18/19 1527  BP: 123/73  Pulse: 97  Temp: (!) 96.4 F (35.8 C)  Weight: 108 lb (49 kg)  Height: 5\' 1"  (1.549 m)    Not recorded      Body mass index is 20.41 kg/m.  PHYSICAL EXAMNIATION:  MOTOR: She has moderate spasticity of bilateral lower extremity, mild to moderate proximal lower extremity weakness,    GAIT/STANCE: She rely on her walker to get up from seated position, significant bilateral adductor muscle spasm, rubbing her knees together while walking, spastic, unsteady slow cautious gait, bilateral flat feet, difficulty to clear bilateral feet from the  floor  DIAGNOSTIC DATA (LABS, IMAGING, TESTING)  - I reviewed patient records, labs, notes, testing and imaging myself where available.   ASSESSMENT AND PLAN  XYA GENUNG is a 73 y.o. female   Spastic paraparesis.  Relapsing remitting multiple sclerosis with cervical cord involvement, history of spondylitic cervical myelopathy   Under electric stimulation, we have performed xeomin injection, used 500 units today  Right adductor longus 150 Right adductor magnus 100 units    Left adductor longus 150 units Left adductor magnus 100 units   Patient tolerate the injection well, returned to 3 months for repeat injection   Marcial Pacas, M.D. Ph.D.  [facility] [address] [place], [place] [address] Ph: [phone] Fax: [phone]  CC: Hulan Fess, MD

## 2019-02-03 DIAGNOSIS — R8290 Unspecified abnormal findings in urine: Secondary | ICD-10-CM | POA: Diagnosis not present

## 2019-02-03 DIAGNOSIS — H6123 Impacted cerumen, bilateral: Secondary | ICD-10-CM | POA: Diagnosis not present

## 2019-02-03 DIAGNOSIS — D649 Anemia, unspecified: Secondary | ICD-10-CM | POA: Diagnosis not present

## 2019-02-03 DIAGNOSIS — G35 Multiple sclerosis: Secondary | ICD-10-CM | POA: Diagnosis not present

## 2019-02-03 DIAGNOSIS — H6120 Impacted cerumen, unspecified ear: Secondary | ICD-10-CM | POA: Diagnosis not present

## 2019-02-03 DIAGNOSIS — Z8371 Family history of colonic polyps: Secondary | ICD-10-CM | POA: Diagnosis not present

## 2019-02-03 DIAGNOSIS — Z0000 Encounter for general adult medical examination without abnormal findings: Secondary | ICD-10-CM | POA: Diagnosis not present

## 2019-02-03 DIAGNOSIS — G8929 Other chronic pain: Secondary | ICD-10-CM | POA: Diagnosis not present

## 2019-02-03 DIAGNOSIS — R7301 Impaired fasting glucose: Secondary | ICD-10-CM | POA: Diagnosis not present

## 2019-02-03 DIAGNOSIS — I10 Essential (primary) hypertension: Secondary | ICD-10-CM | POA: Diagnosis not present

## 2019-02-03 DIAGNOSIS — M810 Age-related osteoporosis without current pathological fracture: Secondary | ICD-10-CM | POA: Diagnosis not present

## 2019-02-09 DIAGNOSIS — G35 Multiple sclerosis: Secondary | ICD-10-CM | POA: Diagnosis not present

## 2019-02-10 DIAGNOSIS — Z8371 Family history of colonic polyps: Secondary | ICD-10-CM | POA: Diagnosis not present

## 2019-02-14 ENCOUNTER — Other Ambulatory Visit: Payer: Self-pay | Admitting: Family Medicine

## 2019-02-14 DIAGNOSIS — Z13820 Encounter for screening for osteoporosis: Secondary | ICD-10-CM

## 2019-02-14 DIAGNOSIS — M810 Age-related osteoporosis without current pathological fracture: Secondary | ICD-10-CM | POA: Diagnosis not present

## 2019-03-07 DIAGNOSIS — M5137 Other intervertebral disc degeneration, lumbosacral region: Secondary | ICD-10-CM | POA: Diagnosis not present

## 2019-03-07 DIAGNOSIS — M47817 Spondylosis without myelopathy or radiculopathy, lumbosacral region: Secondary | ICD-10-CM | POA: Diagnosis not present

## 2019-03-07 DIAGNOSIS — M533 Sacrococcygeal disorders, not elsewhere classified: Secondary | ICD-10-CM | POA: Diagnosis not present

## 2019-03-08 ENCOUNTER — Telehealth: Payer: Self-pay | Admitting: Diagnostic Neuroimaging

## 2019-03-08 NOTE — Telephone Encounter (Signed)
Pt is asking for a call from RN to discuss bowel control relating to [place]

## 2019-03-08 NOTE — Telephone Encounter (Signed)
I returned the call to the patient. Reports worsening of her bowel incontinence. She is taking a fiber supplement daily. She is inquiring about alternate treatments.

## 2019-03-09 DIAGNOSIS — G35 Multiple sclerosis: Secondary | ICD-10-CM | POA: Diagnosis not present

## 2019-03-09 NOTE — Telephone Encounter (Signed)
Fiber supplement; also consider imodium. Otherwise, may need to discuss with PCP or GI. -VRP

## 2019-03-09 NOTE — Telephone Encounter (Signed)
Spoke with patient while she's getting infusion. She denies new medications, new diet or supplements. She denies diarrhea but stated when she has BM she cannot control it. She would like to know if there's a medication she can take.  I advised her will discuss with Dr Leta Baptist and let her know his response. She stated my chart message will be okay, verbalized understanding, appreciation.

## 2019-03-09 NOTE — Telephone Encounter (Signed)
Called patient and advised her of Dr [employer] message. She  verbalized understanding, appreciation.

## 2019-03-09 NOTE — Telephone Encounter (Signed)
May be related to MS. Recommend supportive treatments. -VRP

## 2019-03-09 NOTE — Telephone Encounter (Signed)
Spoke with patient in infusion and gave her Dr Gladstone Lighter recommendations. She is already taking fiber pill, will add Imodium. She understands to discuss with PCP, GI if no improvement. She asked if this could be related to MS; I advised that Dr Leta Baptist didn;t indicate that it could. I will discuss with him and let her know. Patient verbalized understanding, appreciation.

## 2019-03-14 ENCOUNTER — Telehealth: Payer: Self-pay | Admitting: *Deleted

## 2019-03-14 NOTE — Telephone Encounter (Signed)
Tysabri reauth form signed, faxed to [place] [facility]. Copy given to [facility] RN infusion room.

## 2019-03-14 NOTE — Telephone Encounter (Signed)
Tysabri reauthorization form from [place] [facility] placed on Dr [employer] for completion, signature.

## 2019-03-15 NOTE — Telephone Encounter (Signed)
Received notice of pt authorization fo rTysabri, valid 03/15/19 through 10/09/19. Pt enrollment # [id number]. Copy given to [facility], infusion room.

## 2019-03-18 ENCOUNTER — Ambulatory Visit: Payer: Medicare Other | Attending: Internal Medicine

## 2019-03-18 ENCOUNTER — Other Ambulatory Visit: Payer: Medicare Other

## 2019-03-18 DIAGNOSIS — Z23 Encounter for immunization: Secondary | ICD-10-CM | POA: Insufficient documentation

## 2019-03-18 NOTE — Progress Notes (Signed)
   Covid-19 Vaccination Clinic  Name:  Amy Bray    MRN: [id number] DOB: 12-19-1945  03/18/2019  [name] was observed post Covid-19 immunization for 15 minutes without incidence. She was provided with Vaccine Information Sheet and instruction to access the V-Safe system.   [name] was instructed to call 911 with any severe reactions post vaccine: Marland Kitchen Difficulty breathing  . Swelling of your face and throat  . A fast heartbeat  . A bad rash all over your body  . Dizziness and weakness    Immunizations Administered    Name Date Dose VIS Date Route   Pfizer COVID-19 Vaccine 03/18/2019  1:37 PM 0.3 mL 12/31/2018 Intramuscular   Manufacturer: [place]   Lot: [id number]   [place]: [id number]

## 2019-04-06 DIAGNOSIS — G35 Multiple sclerosis: Secondary | ICD-10-CM | POA: Diagnosis not present

## 2019-04-12 ENCOUNTER — Ambulatory Visit: Payer: Medicare Other | Attending: Internal Medicine

## 2019-04-12 DIAGNOSIS — Z23 Encounter for immunization: Secondary | ICD-10-CM

## 2019-04-12 NOTE — Progress Notes (Signed)
   Covid-19 Vaccination Clinic  Name:  Amy Bray    MRN: [id number] DOB: 06-Nov-1945  04/12/2019  [name] was observed post Covid-19 immunization for 15 minutes without incident. She was provided with Vaccine Information Sheet and instruction to access the V-Safe system.   [name] was instructed to call 911 with any severe reactions post vaccine: Marland Kitchen Difficulty breathing  . Swelling of face and throat  . A fast heartbeat  . A bad rash all over body  . Dizziness and weakness   Immunizations Administered    Name Date Dose VIS Date Route   Pfizer COVID-19 Vaccine 04/12/2019  3:43 PM 0.3 mL 12/31/2018 Intramuscular   Manufacturer: [place]   Lot: [id number]   [place]: [id number]

## 2019-04-18 ENCOUNTER — Ambulatory Visit (INDEPENDENT_AMBULATORY_CARE_PROVIDER_SITE_OTHER): Payer: Medicare Other | Admitting: Neurology

## 2019-04-18 ENCOUNTER — Encounter: Payer: Self-pay | Admitting: Neurology

## 2019-04-18 ENCOUNTER — Other Ambulatory Visit: Payer: Self-pay

## 2019-04-18 VITALS — BP 111/70 | HR 92 | Temp 97.8°F | Ht 60.0 in | Wt 103.5 lb

## 2019-04-18 DIAGNOSIS — G8220 Paraplegia, unspecified: Secondary | ICD-10-CM

## 2019-04-18 DIAGNOSIS — G35 Multiple sclerosis: Secondary | ICD-10-CM

## 2019-04-18 MED ORDER — INCOBOTULINUMTOXINA 100 UNITS IM SOLR
500.0000 [IU] | INTRAMUSCULAR | Status: DC
  Administered 2019-04-18: 500 [IU] via INTRAMUSCULAR

## 2019-04-18 NOTE — Progress Notes (Signed)
**  Xeomin 100 units x 5 vials, NDC 0259-1610-01, Lot [id number], Exp 10/2020, office supply.//mck,rn**

## 2019-04-18 NOTE — Progress Notes (Signed)
PATIENT: Amy Bray DOB: [birth year]    HISTORICAL  LARYN WOLVERTON is a 74 years old right-handed female , seen in refer by my colleague Dr. Leta Baptist for evaluation of EMG guided botulism toxin injection for bilateral lower extremity spasticity, gait abnormality, initial evaluation was on September 11 2016.   This is the first time I have seen this patient, I also performed electrical stimulation guided xeomin injection for spastic paraplegia, I used 400 units of xeomin on September 11 2016   She presented with right leg stiffness, spasm, weakness in 2002, was diagnosed with cervical spine disease, had anterior cervical vasectomy and fusion in 2003. Following the surgery, her right leg weakness and spasms significantly improved, however a year later the symptoms returned, she ultimately had second cervical spine surgery in 2006, following second surgery, she only has mild improvement of her symptoms,  Over the years, she continue have progressive worsening spastic bilateral lower extremity, gait abnormality, eventually leading to this diagnosis of relapsing remitting multiple sclerosis,  She was treated with Dwyane Dee over the past few years, her symptoms has been steady.  We have personally reviewed most recent MRI cervical spine in September 2017, 3 small T2 hyper intensity foci within the spinal cord, adjacent to C5-6 to the left, adjacent to C6-7 to the right, and adjacent to T2, no change compared to previous scan in 2015, evidence of surgical fusion from C3-C6.  MRI of brain showed few scattered T2/FLAIR hyperintensity foci in the subcortical deep matter of the frontal area,  Since Tysarbri infusion, her symptoms overall has been steady, but over the years, she noticed increased bilateral lower extremity muscle spasticity, especially at nighttime, she described her left joint worsens stomach, jerking movement, while ambulating, she noticed bilateral knees rubbing towards each other,  bilateral leg tends to "worse each other. She rely on her walker now.  Previously she had EMG guided Botox injection at [facility] for bilateral lower extremity spasticity  with very good result  UPDATE Dec 5th 2018: Responded very well to previous injection September 11, 2016, there was no significant side effect noticed, we used 400 units of xeomin, today she wants to emphasize on left lower extremity spasm, hip flexion,  UPDATE March 23 2017: She responded very well to previous xeomin injection 400 units to bilateral lower extremity, we used 600 units total, with emphasized on bilateral hip adduction, flexion  UPDATE June 24 2017: We used 500 units xeomin today, she denies significant side effect with 600 units last time, insurance maximum 500 units  UPDATE Sept 11 2019: She responded well to previous injection but the benefit only last about 3 months, at the end of the 3 months, she developed increased spasticity of bilateral leg, adduction of bilateral leg  UPDATE July 12 2018: She responded well to previous injection on March 31 2018.  There was no significant side effect noticed, noticed recurrent bilateral lower extremity at the end of injection cycle  UPDATE Sept 30 2020: She did not responded well to previous injections on June, complains of forceful bilateral lower extremity adduction  UPDATE Jan 18 2019: She is practicing water aerobic frequently, which has helped her gait, she noticed increased bilateral lower extremity adduction  UPDATE April 18 2019: She did well with previous injection  REVIEW OF SYSTEMS: Full 14 system review of systems performed and notable only for gait abnormality, back pain   ALLERGIES: No Known Allergies  HOME MEDICATIONS: Current Outpatient Medications  Medication Sig Dispense Refill  .  acetaminophen (TYLENOL) 500 MG tablet Take 500 mg by mouth as needed.    . baclofen (LIORESAL) 10 MG tablet Take 2 tablets (20 mg total) by mouth 3 (three) times  daily. 180 tablet 6  . Cranberry-Vitamin C-Vitamin E (CRANBERRY PLUS VITAMIN C) 4200-20-3 MG-MG-UNIT CAPS Take by mouth.    . Dalfampridine (4-AMINOPYRIDINE) POWD 5 mg by Does not apply route 3 (three) times daily. 5mg  capsules, one tid    . denosumab (PROLIA) 60 MG/ML SOLN injection Inject 60 mg into the skin every 6 (six) months. Administer in upper arm, thigh, or abdomen    . incobotulinumtoxinA (XEOMIN) 100 units SOLR injection Inject 500 Units into the muscle every 3 (three) months.    Marland Kitchen lisinopril (PRINIVIL,ZESTRIL) 10 MG tablet Take 1 tablet by mouth daily.    . natalizumab (TYSABRI) 300 MG/15ML injection Inject into the vein.    Marland Kitchen nortriptyline (PAMELOR) 50 MG capsule Take 1 capsule by mouth daily.    Marland Kitchen oxybutynin (DITROPAN-XL) 5 MG 24 hr tablet Take 5 mg by mouth daily.    . traMADol-acetaminophen (ULTRACET) 37.5-325 MG per tablet Take 1 tablet by mouth daily as needed.     No current facility-administered medications for this visit.    PAST MEDICAL HISTORY: Past Medical History:  Diagnosis Date  . Cervical myelopathy ([place])   . Multiple sclerosis ([place])   . Osteoporosis 2018   hip    PAST SURGICAL HISTORY: Past Surgical History:  Procedure Laterality Date  . CERVICAL FUSION  01/2001, 01/2004   C3-C6    FAMILY HISTORY: Family History  Problem Relation Age of Onset  . Transient ischemic attack Mother   . Pancreatic cancer Father   . Breast cancer Cousin     SOCIAL HISTORY:  Social History   Socioeconomic History  . Marital status: Married    Spouse name: Zuria Sthill  . Number of children: 0  . Years of education: College  . Highest education level: Not on file  Occupational History    Employer: OTHER    Comment: n/a  Tobacco Use  . Smoking status: Former Smoker    Packs/day: 1.00    Years: 15.00    Pack years: 15.00    Types: Cigarettes    Quit date: 01/21/1983    Years since quitting: 36.2  . Smokeless tobacco: Never Used  Substance and Sexual Activity    . Alcohol use: Yes    Alcohol/week: 0.0 standard drinks    Comment: occasionally- wine  . Drug use: No  . Sexual activity: Not on file  Other Topics Concern  . Not on file  Social History Narrative   Patient lives at home with her spouse, who has dementia.   Caffeine Use-none   Social Determinants of Health   Financial Resource Strain:   . Difficulty of Paying Living Expenses:   Food Insecurity:   . Worried About Charity fundraiser in the Last Year:   . Arboriculturist in the Last Year:   Transportation Needs:   . Film/video editor (Medical):   Marland Kitchen Lack of Transportation (Non-Medical):   Physical Activity:   . Days of Exercise per Week:   . Minutes of Exercise per Session:   Stress:   . Feeling of Stress :   Social Connections:   . Frequency of Communication with Friends and Family:   . Frequency of Social Gatherings with Friends and Family:   . Attends Religious Services:   . Active Member of  Clubs or Organizations:   . Attends Archivist Meetings:   Marland Kitchen Marital Status:   Intimate Partner Violence:   . Fear of Current or Ex-Partner:   . Emotionally Abused:   Marland Kitchen Physically Abused:   . Sexually Abused:      PHYSICAL EXAM   Vitals:   04/18/19 1512  BP: 111/70  Pulse: 92  Temp: 97.8 F (36.6 C)  Weight: 103 lb 8 oz (46.9 kg)  Height: 5' (1.524 m)    Not recorded      Body mass index is 20.21 kg/m.  PHYSICAL EXAMNIATION:  MOTOR: She has moderate spasticity of bilateral lower extremity, mild to moderate proximal lower extremity weakness,    GAIT/STANCE: She rely on her walker to get up from seated position, significant bilateral adductor muscle spasm, rubbing her knees together while walking, spastic, unsteady slow cautious gait, bilateral flat feet, difficulty to clear bilateral feet from the floor  DIAGNOSTIC DATA (LABS, IMAGING, TESTING)  - I reviewed patient records, labs, notes, testing and imaging myself where  available.   ASSESSMENT AND PLAN  COLUMBINE ACRE is a 74 y.o. female   Spastic paraparesis.  Relapsing remitting multiple sclerosis with cervical cord involvement, history of spondylitic cervical myelopathy   Under electric stimulation, we have performed xeomin injection, used 500 units today  Right adductor longus 150 Right adductor magnus 100 units    Left adductor longus 150 units Left adductor magnus 100 units   Patient tolerate the injection well, returned to 3 months for repeat injection   Marcial Pacas, M.D. Ph.D.  [facility] [address] [place], [place] [address] Ph: [phone] Fax: [phone]  CC: Hulan Fess, MD

## 2019-05-04 ENCOUNTER — Telehealth: Payer: Self-pay | Admitting: *Deleted

## 2019-05-04 DIAGNOSIS — G35 Multiple sclerosis: Secondary | ICD-10-CM | POA: Diagnosis not present

## 2019-05-04 NOTE — Telephone Encounter (Signed)
Patient in for Tysabri infusion. Infusion RN drew lab for JCV. Blood specimen placed in [facility] lock box.

## 2019-05-09 ENCOUNTER — Telehealth: Payer: Medicare Other | Admitting: Diagnostic Neuroimaging

## 2019-05-09 ENCOUNTER — Telehealth: Payer: Self-pay | Admitting: *Deleted

## 2019-05-09 ENCOUNTER — Telehealth: Payer: Self-pay | Admitting: Diagnostic Neuroimaging

## 2019-05-09 NOTE — Telephone Encounter (Signed)
..   Pt understands that although there may be some limitations with this type of visit, we will take all precautions to reduce any security or privacy concerns.  Pt understands that this will be treated like an in office visit and we will file with pt's insurance, and there may be a patient responsible charge related to this service. ? ?

## 2019-05-09 NOTE — Telephone Encounter (Signed)
Patient called and changed her 2:30 pm office visit today to VV, however she didn't sign on.   Received from patient's my chart at 2:53 pm: Dr. Leta Baptist.  I can't make my appointment today because I can't walk.  Last week I could walk with my walker.  This week I am not able to pick up my feet but I can stand as long  as I can pull myself up or sit in a chair and push up with the arms of the chair.  Also my balance this week is bad.  I can still stand up if I can pull myself up or push up with the arms of a chair.  I am going to the pool 2 to 3 times a week.  I am afraid I am going to have to go to a wheelchair.  If there is anything I can do please let me know.  Needless to say I am very frustrated.  I am sorry I can't make my appointment I replied to her asking to reschedule the VV and advised this will be sent to MD for reply, recommendations.

## 2019-05-10 NOTE — Telephone Encounter (Signed)
Patient rescheduled in office visit.

## 2019-05-11 DIAGNOSIS — M47817 Spondylosis without myelopathy or radiculopathy, lumbosacral region: Secondary | ICD-10-CM | POA: Diagnosis not present

## 2019-05-11 DIAGNOSIS — M5137 Other intervertebral disc degeneration, lumbosacral region: Secondary | ICD-10-CM | POA: Diagnosis not present

## 2019-05-11 DIAGNOSIS — M533 Sacrococcygeal disorders, not elsewhere classified: Secondary | ICD-10-CM | POA: Diagnosis not present

## 2019-05-11 DIAGNOSIS — M810 Age-related osteoporosis without current pathological fracture: Secondary | ICD-10-CM | POA: Diagnosis not present

## 2019-05-11 DIAGNOSIS — I10 Essential (primary) hypertension: Secondary | ICD-10-CM | POA: Diagnosis not present

## 2019-05-16 ENCOUNTER — Encounter: Payer: Self-pay | Admitting: *Deleted

## 2019-05-30 ENCOUNTER — Other Ambulatory Visit: Payer: Self-pay

## 2019-05-30 ENCOUNTER — Ambulatory Visit (INDEPENDENT_AMBULATORY_CARE_PROVIDER_SITE_OTHER): Payer: Medicare Other | Admitting: Diagnostic Neuroimaging

## 2019-05-30 ENCOUNTER — Encounter: Payer: Self-pay | Admitting: Diagnostic Neuroimaging

## 2019-05-30 VITALS — BP 98/60 | HR 88 | Temp 97.2°F | Ht 61.0 in | Wt 98.8 lb

## 2019-05-30 DIAGNOSIS — G8220 Paraplegia, unspecified: Secondary | ICD-10-CM | POA: Diagnosis not present

## 2019-05-30 DIAGNOSIS — G35 Multiple sclerosis: Secondary | ICD-10-CM | POA: Diagnosis not present

## 2019-05-30 NOTE — Progress Notes (Signed)
JCV blood specimen placed in [facility] lock box for pick up.

## 2019-05-30 NOTE — Progress Notes (Signed)
[facility]  PATIENT: Amy Bray DOB: 1946/01/18  REFERRING CLINICIAN:  HISTORY FROM: patient REASON FOR VISIT: follow up   HISTORICAL  CHIEF COMPLAINT:  Chief Complaint  Patient presents with  . Multiple Sclerosis    rm 6, 6 month FU "next Tysabri infusion 06/01/19; episode of feeling like my feet were nailed to the floor x 2 days"    HISTORY OF PRESENT ILLNESS:   UPDATE (05/30/19, VRP): Since last visit, doing poorly; husband having severe dementia issues causing significant stress. Patient is struggling with gait and weakness.    UPDATE (11/08/18, VRP): Since last visit, doing about the same. MS symptoms are stable. Severity moderate. No alleviating or aggravating factors. Tolerating meds. Now more caregiver stress, as her husband has progressive dementia.   UPDATE (12/07/17, VRP): Since last visit, now with swelling in feet x 2 months. Improved in last 1-2 weeks. No pain. No triggers. Had 1 day with some addl leg weakness (1 hour) then resolved on 11/29/17. Symptoms are now at baseline. Continues PT and water therapy. Tolerating tysabri.    UPDATE (08/10/17, VRP): Since last visit, doing well. Tolerating tysabri and generic amprya. No alleviating or aggravating factors. Hot flashes improved. Fatigue improved.   UPDATE (02/11/17, VRP): Since last visit, doing well. Tolerating meds. No alleviating or aggravating factors. Now on xeomin (Aug and Dec 2018) and spasticity improved. On generic ampyra and doing well.   UPDATE 08/11/16: Since last visit, doing well. Tolerating tysabri and baclofen. Heat has been challenging with spasms. Gait diff with knees rubbing together, slightly worse.  UPDATE 02/11/16: Since last visit, balance diff continues. No flares. MRIs stable. Tolerating tysabri.   UPDATE 10/08/15: Since last visit, pt feels some progression of gait diff and walking problems. No specific attack or flare up.  UPDATE 07/04/15: Since last visit, doing  well. Tolerating tysabri and ampyra.  UPDATE 04/02/15: Since last visit, doing well. Tolerating tysabri and ampyra. Back on nortriptyline (is helping burning pain in feet).   UPDATE 01/03/15: Since last visit, doing better on ampyra (better gait and walking speed). More foot/calf tingling burning pain x 1-2 months. Now with Dr. Andree Elk pain mgmt for back injections x 2, which is helping.  UPDATE 10/02/14: Since last visit, doing well. Requesting transition of care to [place] for pain mgmt; previously at [facility] with Dr. Oliva Bustard.  UPDATE 05/22/14: Since last visit, doing well. On tysabri since March 2016. Fatigue and bladder control much improved. Gait is stable.   UPDATE 02/20/14: Since last visit, had reviewed lab results and now ready to start MS medication. Now would like to start tysabri.  UPDATE 01/09/14: Since last visit, no new events symptoms. LP results reviewed. Didn't get lab work at last visit for MS mimics.   UPDATE 12/07/13: Patient is stable with symptoms. MRI scans reviewed. Continues to have weakness in legs and balance difficulty.  UPDATE 11/21/13: 74 year old right-handed female here for evaluation of lower extremity spasms and weakness. 2002 patient developed onset of right leg stiffness, spasm, weakness. Patient was diagnosed with cervical spine disease, bone spur ridging, and treated with anterior cervical discectomy and fusion in 2003. Following this her right leg weakness and spasms significantly improved. However one year later her symptoms return. Patient followed up with several surgeons and ultimately had a second cervical spine surgery in 2006. Following this patient had slight improvement of right leg however within several months her weakness, spasms and gait continued to decline. Patient transitioned needing a cane  to walk. 2013 patient was evaluated by a neurologist in [place], to evaluate her right leg weakness. Apparently patient had a "multiple sclerosis" workup  including MRI of the brain and extensive blood testing. Patient also had EMG nerve conduction studies looking for peroneal neuropathy but apparently all studies were normal. MRI of the brain did show a right medulla lesion which was reported as a stroke. Over the past 1 year patient has developed new onset left leg weakness, spasm, stiffness. Patient followed up with orthopedic surgery who then referred patient to me for evaluation. Last MRI of cervical spine was in 2006 at [facility]. No bowel or bladder incontinence. No significant numbness or tingling. No weakness in upper extremities. No vision loss, vision changes, slurred speech or trouble talking.   REVIEW OF SYSTEMS: Full 14 system review of systems performed and negative except: as per HPI   ALLERGIES: No Known Allergies  HOME MEDICATIONS: Outpatient Medications Prior to Visit  Medication Sig Dispense Refill  . acetaminophen (TYLENOL) 500 MG tablet Take 500 mg by mouth as needed.    . baclofen (LIORESAL) 10 MG tablet Take 2 tablets (20 mg total) by mouth 3 (three) times daily. 180 tablet 6  . Cranberry-Vitamin C-Vitamin E (CRANBERRY PLUS VITAMIN C) 4200-20-3 MG-MG-UNIT CAPS Take by mouth.    . Dalfampridine (4-AMINOPYRIDINE) POWD 5 mg by Does not apply route 3 (three) times daily. 5mg  capsules, one tid    . denosumab (PROLIA) 60 MG/ML SOLN injection Inject 60 mg into the skin every 6 (six) months. Administer in upper arm, thigh, or abdomen    . incobotulinumtoxinA (XEOMIN) 100 units SOLR injection Inject 500 Units into the muscle every 3 (three) months.    Marland Kitchen lisinopril (PRINIVIL,ZESTRIL) 10 MG tablet Take 1 tablet by mouth daily.    . natalizumab (TYSABRI) 300 MG/15ML injection Inject into the vein.    Marland Kitchen nortriptyline (PAMELOR) 50 MG capsule Take 1 capsule by mouth daily.    Marland Kitchen oxybutynin (DITROPAN-XL) 5 MG 24 hr tablet Take 5 mg by mouth daily.    . traMADol-acetaminophen (ULTRACET) 37.5-325 MG per tablet Take 1 tablet by mouth daily as  needed.    Marland Kitchen UNABLE TO FIND Med Name: CBD oil at bedtime     Facility-Administered Medications Prior to Visit  Medication Dose Route Frequency Provider Last Rate Last Admin  . incobotulinumtoxinA (XEOMIN) 100 units injection 500 Units  500 Units Intramuscular Q90 days Marcial Pacas, MD   500 Units at 04/18/19 1604    PAST MEDICAL HISTORY: Past Medical History:  Diagnosis Date  . Cervical myelopathy ([place])   . Multiple sclerosis ([place])   . Osteoporosis 2018   hip    PAST SURGICAL HISTORY: Past Surgical History:  Procedure Laterality Date  . CERVICAL FUSION  01/2001, 01/2004   C3-C6    FAMILY HISTORY: Family History  Problem Relation Age of Onset  . Transient ischemic attack Mother   . Pancreatic cancer Father   . Breast cancer Cousin     SOCIAL HISTORY:  Social History   Socioeconomic History  . Marital status: Married    Spouse name: Lethea Branigan  . Number of children: 0  . Years of education: College  . Highest education level: Not on file  Occupational History    Employer: OTHER    Comment: n/a  Tobacco Use  . Smoking status: Former Smoker    Packs/day: 1.00    Years: 15.00    Pack years: 15.00    Types: Cigarettes  Quit date: 01/21/1983    Years since quitting: 36.3  . Smokeless tobacco: Never Used  Substance and Sexual Activity  . Alcohol use: Yes    Alcohol/week: 0.0 standard drinks    Comment: occasionally- wine  . Drug use: No  . Sexual activity: Not on file  Other Topics Concern  . Not on file  Social History Narrative   05/30/19 Patient lives at home with her spouse, who has dementia.   Caffeine Use-none   Social Determinants of Health   Financial Resource Strain:   . Difficulty of Paying Living Expenses:   Food Insecurity:   . Worried About Charity fundraiser in the Last Year:   . Arboriculturist in the Last Year:   Transportation Needs:   . Film/video editor (Medical):   Marland Kitchen Lack of Transportation (Non-Medical):   Physical Activity:    . Days of Exercise per Week:   . Minutes of Exercise per Session:   Stress:   . Feeling of Stress :   Social Connections:   . Frequency of Communication with Friends and Family:   . Frequency of Social Gatherings with Friends and Family:   . Attends Religious Services:   . Active Member of Clubs or Organizations:   . Attends Archivist Meetings:   Marland Kitchen Marital Status:   Intimate Partner Violence:   . Fear of Current or Ex-Partner:   . Emotionally Abused:   Marland Kitchen Physically Abused:   . Sexually Abused:      PHYSICAL EXAM  GENERAL EXAM/CONSTITUTIONAL: Vitals:  Vitals:   05/30/19 1533  BP: 98/60  Pulse: 88  Temp: (!) 97.2 F (36.2 C)  Weight: 98 lb 12.8 oz (44.8 kg)  Height: 5\' 1"  (1.549 m)   Body mass index is 18.67 kg/m. Wt Readings from Last 3 Encounters:  05/30/19 98 lb 12.8 oz (44.8 kg)  04/18/19 103 lb 8 oz (46.9 kg)  01/18/19 108 lb (49 kg)    Patient is in no distress; well developed, nourished and groomed; neck is supple  CARDIOVASCULAR:  Examination of carotid arteries is normal; no carotid bruits  Regular rate and rhythm, no murmurs  Examination of peripheral vascular system by observation and palpation is normal  EYES:  Ophthalmoscopic exam of optic discs and posterior segments is normal; no papilledema or hemorrhages No exam data present  MUSCULOSKELETAL:  Gait, strength, tone, movements noted in Neurologic exam below  NEUROLOGIC: MENTAL STATUS:  No flowsheet data found.  awake, alert, oriented to person, place and time  recent and remote memory intact  normal attention and concentration  language fluent, comprehension intact, naming intact,   fund of knowledge appropriate  CRANIAL NERVE:   2nd - no papilledema on fundoscopic exam  2nd, 3rd, 4th, 6th - pupils equal and reactive to light, visual fields full to confrontation, extraocular muscles intact, no nystagmus  5th - facial sensation symmetric  7th - facial strength  symmetric  8th - hearing intact  9th - palate elevates symmetrically, uvula midline  11th - shoulder shrug symmetric  12th - tongue protrusion midline  MOTOR:   normal bulk and tone, full strength in the BUE; EXCEPT MILD INCREASED EXT TONE IN BLE; BLE (HF 3, KE 5 KF 4 DF 3)  SENSORY:   normal and symmetric to light touch, temperature; DECR AT TOES  COORDINATION:  finger-nose-finger, fine finger movements normal  REFLEXES:   deep tendon reflexes present and symmetric; EXCEPT BUE 3; BLE (HIP FLEXION 2;  KNEES 3; ANKLES 1-2)  GAIT/STATION:   VERY UNSTEADY, SPASTIC, PARAPARETIC GAIT; USES ROLLATOR WALKER     DIAGNOSTIC DATA (LABS, IMAGING, TESTING) - I reviewed patient records, labs, notes, testing and imaging myself where available.  Lab Results  Component Value Date   WBC 6.4 11/08/2018   HGB 12.4 11/08/2018   HCT 38.7 11/08/2018   MCV 98 (H) 11/08/2018   PLT 284 11/08/2018      Component Value Date/Time   NA 142 11/08/2018 1636   K 4.2 11/08/2018 1636   CL 105 11/08/2018 1636   CO2 21 11/08/2018 1636   GLUCOSE 81 11/08/2018 1636   BUN 18 11/08/2018 1636   CREATININE 0.69 11/08/2018 1636   CALCIUM 8.8 11/08/2018 1636   PROT 6.5 11/08/2018 1636   ALBUMIN 4.5 11/08/2018 1636   AST 19 11/08/2018 1636   ALT 18 11/08/2018 1636   ALKPHOS 38 (L) 11/08/2018 1636   BILITOT 0.2 11/08/2018 1636   GFRNONAA 88 11/08/2018 1636   GFRAA 101 11/08/2018 1636   No results found for: CHOL, HDL, LDLCALC, LDLDIRECT, TRIG, CHOLHDL No results found for: HGBA1C No results found for: VITAMINB12 No results found for: TSH  Vit D, 25-Hydroxy  Date Value Ref Range Status  01/03/2015 36.8 30.0 - 100.0 ng/mL Final    Comment:    Vitamin D deficiency has been defined by the Institute of Medicine and an Endocrine Society practice guideline as a level of serum 25-OH vitamin D less than 20 ng/mL (1,2). The Endocrine Society went on to further define vitamin D insufficiency as a  level between 21 and 29 ng/mL (2). 1. IOM (Institute of Medicine). 2010. Dietary reference    intakes for calcium and D. [place]: The    [employer]. 2. Holick MF, Binkley Stevens, Bischoff-Ferrari HA, et al.    Evaluation, treatment, and prevention of vitamin D    deficiency: an Endocrine Society clinical practice    guideline. JCEM. 2011 Jul; 96(7):1911-30.     I reviewed images myself. In my review, I think there is a 2cm spinal cord lesion (right side, C6-C7) and a smaller left T1 lesion, suspicious for demyelinating disease, on the MRI cervical spine from 10/08/03. No adjacent disc or bone degeneration or spinal stenosis. The MRI report states that the cord appears normal. -VRP  10/08/03 MRI cervical spine (images reviewed) - Satisfactory anterior plate fusion of C4 and C5.Disk degeneration and spondylosis at C3-4 and C5-6 with biforaminal narrowing but no significant central canal stenosis. There is no disk protrusion.   12/29/13 LP - WBC 1, RBC 0, protein 26, glucose 59, OCB > 5, gram stain neg  04/26/17 MRI of the brain with and without contrast [I reviewed images myself and agree with interpretation. 1 small new lesion. -VRP]  1.   Small left occipital subcortical T2/FLAIR hyperintense focus with subtle hyperintensity on diffusion-weighted images.  This focus was not present on the 08/24/2016 MRI.  The etiology is uncertain but it could represent a sub-chronic focus of chronic ischemic or demyelinating change.   2.   There are a couple T2/FLAIR hyperintense foci in the subcortical and deep white matter.  This is a nonspecific finding and could be incidental at this age, will be due to minimal chronic microvascular ischemic change or demyelination.  None of the foci appears to be acute and there is no change when compared to the 08/24/2016 MRI. 3.   Mild generalized cortical atrophy, typical for age. 4.   There  is a normal enhancement pattern.  04/26/17 MRI of the cervical spine  with and without contrast [I reviewed images myself and agree with interpretation. Stable since 2017.  -VRP]  1.   There are 3 small T2/FLAIR hyperintense foci within the spinal cord adjacent to C6, C7 and T2.  All of these were present on the 10/19/2015 MRI.  None of these enhance. 2.   Multilevel degenerative changes as detailed above.   The degenerative changes at C6-C7 have slightly progressed when compared to the previous MRI.  There did not appear to be any nerve root compression. 3.   ACDF from C3 through C6.   4.   There is a normal enhancement pattern of there are no acute findings.  04/26/17 Unremarkable MRI thoracic spine (with and without). [I reviewed images myself and agree with interpretation. -VRP]   05/19/18 MRI cervical spine (with and without) demonstrating: - Small chronic plaques at C6 on the left and C7 on the right; no acute plaques - No new plaques from MRI on 04/26/2017; prior T2 level plaque is not well visualized on current study  05/19/18 MRI thoracic spine (with and without) demonstrating:  - subtle T2 hyperintensity at C7 level on sagittal views; consistent with chronic plaque - no acute plaques; remainder of spinal cord is also unremarkable   Anti-JCV antibody 01/10/14 - 0.10 negative  05/22/14 - 0.09 negative 12/2014 - negative March 2017 - negative 10/09/15 - 0.13 negative 02/11/16 JCV antibody 0.11 negative 08-13-16 JCV antibody 0.11 negative sy 03/05/17 JCV negative 0.07 08-15-17 JCV negative, index 0.08    ASSESSMENT AND PLAN  74 y.o. year old female here with  bilateral lower extremity spasticity, weakness,with brisk reflexes in upper and lower extremities, left Hoffman sign, bilateral upgoing toes. MRIs from 2005 and 2015 demonstrate multiple spinal cord lesions. Now suspect demyelinating disease, with first attack in 2002, second attack in 2003, and 3rd attack in 2014.  Then with some progression of symptoms since mid-2017.  Some worsening balance since  Jan 2019.    Dx: multiple sclerosis   Multiple sclerosis ([place])  Spinal paraparesis ([place])    PLAN:  MULTIPLE SCLEROSIS (RRMS; ? Secondary progression by symptoms) - continue tysabri; may consider de-escalating therapy due to advanced age and progression of symptosm - continue dalfampridine; walking speed and balance are better with medcation (T25W 46 --> 25s) - continue PT and rollator; may need wheelchair support soon   BOWEL INCONTINENCE (improved) - schedule bathroom breaks; continue fiber supplement  CAREGIVER STRESS - husband has dementia  MUSCLE SPASMS (stable) - continue baclofen - continue chemodenervation (per Dr. [name])  Orders Placed This Encounter  Procedures  . CBC with Differential/Platelet  . Comprehensive metabolic panel  . Stratify JCV(TM) Ab w/Index   Return in about 9 months (around 03/01/2020).     Penni Bombard, MD [id number], [id number] PM Certified in Neurology, Neurophysiology and Neuroimaging  [facility] [address] [place], [place] [address] [phone]

## 2019-06-01 ENCOUNTER — Other Ambulatory Visit: Payer: Self-pay

## 2019-06-01 ENCOUNTER — Ambulatory Visit
Admission: RE | Admit: 2019-06-01 | Discharge: 2019-06-01 | Disposition: A | Discharge status: Home / Self Care | Payer: Medicare Other | Source: Ambulatory Visit | Attending: Family Medicine | Admitting: Family Medicine

## 2019-06-01 DIAGNOSIS — M810 Age-related osteoporosis without current pathological fracture: Secondary | ICD-10-CM | POA: Diagnosis not present

## 2019-06-01 DIAGNOSIS — G35 Multiple sclerosis: Secondary | ICD-10-CM | POA: Diagnosis not present

## 2019-06-01 DIAGNOSIS — Z780 Asymptomatic menopausal state: Secondary | ICD-10-CM | POA: Diagnosis not present

## 2019-06-01 DIAGNOSIS — M8588 Other specified disorders of bone density and structure, other site: Secondary | ICD-10-CM | POA: Diagnosis not present

## 2019-06-06 ENCOUNTER — Encounter: Payer: Self-pay | Admitting: *Deleted

## 2019-06-28 ENCOUNTER — Encounter: Payer: Self-pay | Admitting: *Deleted

## 2019-06-29 DIAGNOSIS — G35 Multiple sclerosis: Secondary | ICD-10-CM | POA: Diagnosis not present

## 2019-07-11 DIAGNOSIS — M5137 Other intervertebral disc degeneration, lumbosacral region: Secondary | ICD-10-CM | POA: Diagnosis not present

## 2019-07-11 DIAGNOSIS — M47817 Spondylosis without myelopathy or radiculopathy, lumbosacral region: Secondary | ICD-10-CM | POA: Diagnosis not present

## 2019-07-11 DIAGNOSIS — M533 Sacrococcygeal disorders, not elsewhere classified: Secondary | ICD-10-CM | POA: Diagnosis not present

## 2019-07-20 ENCOUNTER — Other Ambulatory Visit: Payer: Self-pay

## 2019-07-20 ENCOUNTER — Ambulatory Visit (INDEPENDENT_AMBULATORY_CARE_PROVIDER_SITE_OTHER): Payer: Medicare Other | Admitting: Neurology

## 2019-07-20 ENCOUNTER — Encounter: Payer: Self-pay | Admitting: Neurology

## 2019-07-20 VITALS — BP 100/67 | HR 103 | Ht 61.0 in | Wt 98.5 lb

## 2019-07-20 DIAGNOSIS — G8220 Paraplegia, unspecified: Secondary | ICD-10-CM

## 2019-07-20 DIAGNOSIS — G35 Multiple sclerosis: Secondary | ICD-10-CM

## 2019-07-20 DIAGNOSIS — G8250 Quadriplegia, unspecified: Secondary | ICD-10-CM

## 2019-07-20 NOTE — Progress Notes (Signed)
**  Xeomin 200 units x 2 vials, [facility] 0259-1620-01, Lot [id number], Exp 02/2021, Xeomin 100 units x 1 vial, [place], Lot [id number], Exp 12/2020, office supply.//mck,rn**

## 2019-07-22 DIAGNOSIS — G35 Multiple sclerosis: Secondary | ICD-10-CM | POA: Diagnosis not present

## 2019-07-27 DIAGNOSIS — G35 Multiple sclerosis: Secondary | ICD-10-CM | POA: Diagnosis not present

## 2019-07-28 ENCOUNTER — Telehealth: Payer: Self-pay | Admitting: Neurology

## 2019-07-28 NOTE — Telephone Encounter (Addendum)
Open by error

## 2019-07-29 MED ORDER — INCOBOTULINUMTOXINA 100 UNITS IM SOLR
400.0000 [IU] | INTRAMUSCULAR | Status: DC
  Administered 2019-07-29: 400 [IU] via INTRAMUSCULAR

## 2019-07-29 NOTE — Progress Notes (Signed)
PATIENT: Amy Bray DOB: 04-21-1945    HISTORICAL  Amy Bray is a 74 years old right-handed female , seen in refer by my colleague Dr. Leta Baptist for evaluation of EMG guided botulism toxin injection for bilateral lower extremity spasticity, gait abnormality, initial evaluation was on September 11 2016.   This is the first time I have seen this patient, I also performed electrical stimulation guided xeomin injection for spastic paraplegia, I used 400 units of xeomin on September 11 2016   She presented with right leg stiffness, spasm, weakness in 2002, was diagnosed with cervical spine disease, had anterior cervical vasectomy and fusion in 2003. Following the surgery, her right leg weakness and spasms significantly improved, however a year later the symptoms returned, she ultimately had second cervical spine surgery in 2006, following second surgery, she only has mild improvement of her symptoms,  Over the years, she continue have progressive worsening spastic bilateral lower extremity, gait abnormality, eventually leading to this diagnosis of relapsing remitting multiple sclerosis,  She was treated with [facility] over the past few years, her symptoms has been steady.  We have personally reviewed most recent MRI cervical spine in September 2017, 3 small T2 hyper intensity foci within the spinal cord, adjacent to C5-6 to the left, adjacent to C6-7 to the right, and adjacent to T2, no change compared to previous scan in 2015, evidence of surgical fusion from C3-C6.  MRI of brain showed few scattered T2/FLAIR hyperintensity foci in the subcortical deep matter of the frontal area,  Since Tysarbri infusion, her symptoms overall has been steady, but over the years, she noticed increased bilateral lower extremity muscle spasticity, especially at nighttime, she described her left joint worsens stomach, jerking movement, while ambulating, she noticed bilateral knees rubbing towards each other,  bilateral leg tends to "worse each other. She rely on her walker now.  Previously she had EMG guided Botox injection at [facility] for bilateral lower extremity spasticity  with very good result  UPDATE Dec 5th 2018: Responded very well to previous injection September 11, 2016, there was no significant side effect noticed, we used 400 units of xeomin, today she wants to emphasize on left lower extremity spasm, hip flexion,  UPDATE March 23 2017: She responded very well to previous xeomin injection 400 units to bilateral lower extremity, we used 600 units total, with emphasized on bilateral hip adduction, flexion  UPDATE June 24 2017: We used 500 units xeomin today, she denies significant side effect with 600 units last time, insurance maximum 500 units  UPDATE Sept 11 2019: She responded well to previous injection but the benefit only last about 3 months, at the end of the 3 months, she developed increased spasticity of bilateral leg, adduction of bilateral leg  UPDATE July 12 2018: She responded well to previous injection on March 31 2018.  There was no significant side effect noticed, noticed recurrent bilateral lower extremity at the end of injection cycle  UPDATE Sept 30 2020: She did not responded well to previous injections on June, complains of forceful bilateral lower extremity adduction  UPDATE Jan 18 2019: She is practicing water aerobic frequently, which has helped her gait, she noticed increased bilateral lower extremity adduction  UPDATE April 18 2019: She did well with previous injection  UPDATE July 29 2019: She responded very well to previous injection, used Xeomin 400 units,  REVIEW OF SYSTEMS: Full 14 system review of systems performed and notable only for gait abnormality, back pain  ALLERGIES: No Known Allergies  HOME MEDICATIONS: Current Outpatient Medications  Medication Sig Dispense Refill  . acetaminophen (TYLENOL) 500 MG tablet Take 500 mg by mouth as needed.      . baclofen (LIORESAL) 10 MG tablet Take 2 tablets (20 mg total) by mouth 3 (three) times daily. 180 tablet 6  . Cranberry-Vitamin C-Vitamin E (CRANBERRY PLUS VITAMIN C) 4200-20-3 MG-MG-UNIT CAPS Take by mouth.    . Dalfampridine (4-AMINOPYRIDINE) POWD 5 mg by Does not apply route 3 (three) times daily. 5mg  capsules, one tid    . denosumab (PROLIA) 60 MG/ML SOLN injection Inject 60 mg into the skin every 6 (six) months. Administer in upper arm, thigh, or abdomen    . incobotulinumtoxinA (XEOMIN) 100 units SOLR injection Inject 500 Units into the muscle every 3 (three) months.    Marland Kitchen lisinopril (PRINIVIL,ZESTRIL) 10 MG tablet Take 1 tablet by mouth daily.    . natalizumab (TYSABRI) 300 MG/15ML injection Inject into the vein.    Marland Kitchen nortriptyline (PAMELOR) 50 MG capsule Take 1 capsule by mouth daily.    Marland Kitchen oxybutynin (DITROPAN-XL) 5 MG 24 hr tablet Take 5 mg by mouth daily.    . traMADol-acetaminophen (ULTRACET) 37.5-325 MG per tablet Take 1 tablet by mouth daily as needed.    Marland Kitchen UNABLE TO FIND Med Name: CBD oil at bedtime     No current facility-administered medications for this visit.    PAST MEDICAL HISTORY: Past Medical History:  Diagnosis Date  . Cervical myelopathy ([place])   . Multiple sclerosis ([place])   . Osteoporosis 2018   hip    PAST SURGICAL HISTORY: Past Surgical History:  Procedure Laterality Date  . CERVICAL FUSION  01/2001, 01/2004   C3-C6    FAMILY HISTORY: Family History  Problem Relation Age of Onset  . Transient ischemic attack Mother   . Pancreatic cancer Father   . Breast cancer Cousin     SOCIAL HISTORY:  Social History   Socioeconomic History  . Marital status: Married    Spouse name: Deazia Lampi  . Number of children: 0  . Years of education: College  . Highest education level: Not on file  Occupational History    Employer: OTHER    Comment: n/a  Tobacco Use  . Smoking status: Former Smoker    Packs/day: 1.00    Years: 15.00    Pack years: 15.00     Types: Cigarettes    Quit date: 01/21/1983    Years since quitting: 36.5  . Smokeless tobacco: Never Used  Substance and Sexual Activity  . Alcohol use: Yes    Alcohol/week: 0.0 standard drinks    Comment: occasionally- wine  . Drug use: No  . Sexual activity: Not on file  Other Topics Concern  . Not on file  Social History Narrative   05/30/19 Patient lives at home with her spouse, who has dementia.   Caffeine Use-none   Social Determinants of Health   Financial Resource Strain:   . Difficulty of Paying Living Expenses:   Food Insecurity:   . Worried About Charity fundraiser in the Last Year:   . Arboriculturist in the Last Year:   Transportation Needs:   . Film/video editor (Medical):   Marland Kitchen Lack of Transportation (Non-Medical):   Physical Activity:   . Days of Exercise per Week:   . Minutes of Exercise per Session:   Stress:   . Feeling of Stress :   Social Connections:   .  Frequency of Communication with Friends and Family:   . Frequency of Social Gatherings with Friends and Family:   . Attends Religious Services:   . Active Member of Clubs or Organizations:   . Attends Archivist Meetings:   Marland Kitchen Marital Status:   Intimate Partner Violence:   . Fear of Current or Ex-Partner:   . Emotionally Abused:   Marland Kitchen Physically Abused:   . Sexually Abused:      PHYSICAL EXAM   Vitals:   07/20/19 1330  BP: 100/67  Pulse: (!) 103  Weight: 98 lb 8 oz (44.7 kg)  Height: 5\' 1"  (1.549 m)   Not recorded     Body mass index is 18.61 kg/m.  PHYSICAL EXAMNIATION:  MOTOR: She has moderate spasticity of bilateral lower extremity, mild to moderate proximal lower extremity weakness,    GAIT/STANCE: She rely on her walker to get up from seated position, significant bilateral adductor muscle spasm, rubbing her knees together while walking, spastic, unsteady slow cautious gait, bilateral flat feet, difficulty to clear bilateral feet from the floor  DIAGNOSTIC DATA  (LABS, IMAGING, TESTING)  - I reviewed patient records, labs, notes, testing and imaging myself where available.   ASSESSMENT AND PLAN  Amy Bray is a 74 y.o. female   Spastic paraparesis.  Relapsing remitting multiple sclerosis with cervical cord involvement, history of spondylitic cervical myelopathy   Under electric stimulation, we have performed xeomin injection, used 500 units today  Right adductor longus 150 Right adductor magnus 100 units    Left adductor longus 150 units Left adductor magnus 100 units   Patient tolerate the injection well, returned to 3 months for repeat injection   Marcial Pacas, M.D. Ph.D.  [facility] [address], [place], [place] [address] Ph: [phone] Fax: [phone]  CC: Hulan Fess, MD

## 2019-08-15 ENCOUNTER — Other Ambulatory Visit: Payer: Self-pay | Admitting: Diagnostic Neuroimaging

## 2019-08-15 DIAGNOSIS — N311 Reflex neuropathic bladder, not elsewhere classified: Secondary | ICD-10-CM | POA: Diagnosis not present

## 2019-08-15 DIAGNOSIS — N319 Neuromuscular dysfunction of bladder, unspecified: Secondary | ICD-10-CM | POA: Diagnosis not present

## 2019-08-16 DIAGNOSIS — M810 Age-related osteoporosis without current pathological fracture: Secondary | ICD-10-CM | POA: Diagnosis not present

## 2019-08-19 LAB — STRATIFY JCV(TM) AB W/INDEX
JCV Antibody: NEGATIVE
JCV Index Value: 0.13

## 2019-08-19 LAB — CBC WITH DIFFERENTIAL/PLATELET
Basophils Absolute: 0 10*3/uL (ref 0.0–0.2)
Basos: 0 %
EOS (ABSOLUTE): 0.1 10*3/uL (ref 0.0–0.4)
Eos: 2 %
Hematocrit: 37.8 % (ref 34.0–46.6)
Hemoglobin: 12.6 g/dL (ref 11.1–15.9)
Immature Grans (Abs): 0.1 10*3/uL (ref 0.0–0.1)
Immature Granulocytes: 1 %
Lymphocytes Absolute: 2.3 10*3/uL (ref 0.7–3.1)
Lymphs: 28 %
MCH: 31.7 pg (ref 26.6–33.0)
MCHC: 33.3 g/dL (ref 31.5–35.7)
MCV: 95 fL (ref 79–97)
Monocytes Absolute: 0.7 10*3/uL (ref 0.1–0.9)
Monocytes: 9 %
Neutrophils Absolute: 4.9 10*3/uL (ref 1.4–7.0)
Neutrophils: 60 %
Platelets: 294 10*3/uL (ref 150–450)
RBC: 3.98 x10E6/uL (ref 3.77–5.28)
RDW: 12.1 % (ref 11.7–15.4)
WBC: 8 10*3/uL (ref 3.4–10.8)

## 2019-08-19 LAB — COMPREHENSIVE METABOLIC PANEL
ALT: 14 IU/L (ref 0–32)
AST: 19 IU/L (ref 0–40)
Albumin/Globulin Ratio: 2.8 — ABNORMAL HIGH (ref 1.2–2.2)
Albumin: 4.7 g/dL (ref 3.7–4.7)
Alkaline Phosphatase: 42 IU/L (ref 39–117)
BUN/Creatinine Ratio: 26 (ref 12–28)
BUN: 17 mg/dL (ref 8–27)
Bilirubin Total: 0.2 mg/dL (ref 0.0–1.2)
CO2: 25 mmol/L (ref 20–29)
Calcium: 9.2 mg/dL (ref 8.7–10.3)
Chloride: 104 mmol/L (ref 96–106)
Creatinine, Ser: 0.66 mg/dL (ref 0.57–1.00)
GFR calc Af Amer: 102 mL/min/{1.73_m2} (ref >59)
GFR calc non Af Amer: 89 mL/min/{1.73_m2} (ref >59)
Globulin, Total: 1.7 g/dL (ref 1.5–4.5)
Glucose: 87 mg/dL (ref 65–99)
Potassium: 4.1 mmol/L (ref 3.5–5.2)
Sodium: 141 mmol/L (ref 134–144)
Total Protein: 6.4 g/dL (ref 6.0–8.5)

## 2019-08-24 DIAGNOSIS — G35 Multiple sclerosis: Secondary | ICD-10-CM | POA: Diagnosis not present

## 2019-08-29 DIAGNOSIS — M47817 Spondylosis without myelopathy or radiculopathy, lumbosacral region: Secondary | ICD-10-CM | POA: Diagnosis not present

## 2019-08-29 DIAGNOSIS — M533 Sacrococcygeal disorders, not elsewhere classified: Secondary | ICD-10-CM | POA: Diagnosis not present

## 2019-08-29 DIAGNOSIS — M5137 Other intervertebral disc degeneration, lumbosacral region: Secondary | ICD-10-CM | POA: Diagnosis not present

## 2019-09-12 ENCOUNTER — Telehealth: Payer: Self-pay | Admitting: *Deleted

## 2019-09-12 NOTE — Telephone Encounter (Signed)
Tysabri reauth form signed, faxed to [place], copy given to [employer], infusion suite.

## 2019-09-12 NOTE — Telephone Encounter (Signed)
Received Tysabri reauthorization form from [employer]. On MD's desk for review, signature.

## 2019-09-13 NOTE — Telephone Encounter (Signed)
Received fax from [employer]: Tysabri reauth valid from 09/12/19 through 04/13/2020. Copy given to [facility], infusion suite.

## 2019-09-21 DIAGNOSIS — G35 Multiple sclerosis: Secondary | ICD-10-CM | POA: Diagnosis not present

## 2019-10-19 DIAGNOSIS — G35 Multiple sclerosis: Secondary | ICD-10-CM | POA: Diagnosis not present

## 2019-10-24 ENCOUNTER — Ambulatory Visit: Payer: Medicare Other | Admitting: Neurology

## 2019-10-26 ENCOUNTER — Encounter: Payer: Self-pay | Admitting: Neurology

## 2019-10-26 ENCOUNTER — Ambulatory Visit (INDEPENDENT_AMBULATORY_CARE_PROVIDER_SITE_OTHER): Payer: Medicare Other | Admitting: Neurology

## 2019-10-26 ENCOUNTER — Other Ambulatory Visit: Payer: Self-pay

## 2019-10-26 VITALS — BP 106/84 | HR 86 | Ht 61.0 in | Wt 99.5 lb

## 2019-10-26 DIAGNOSIS — G35 Multiple sclerosis: Secondary | ICD-10-CM

## 2019-10-26 DIAGNOSIS — G8250 Quadriplegia, unspecified: Secondary | ICD-10-CM

## 2019-10-26 MED ORDER — INCOBOTULINUMTOXINA 100 UNITS IM SOLR
500.0000 [IU] | INTRAMUSCULAR | Status: DC
  Administered 2019-10-26: 500 [IU] via INTRAMUSCULAR

## 2019-10-26 NOTE — Progress Notes (Signed)
PATIENT: Amy Bray DOB: July 02, 1945    HISTORICAL  Amy Bray is a 74 years old right-handed female , seen in refer by my colleague Dr. Leta Baptist for evaluation of EMG guided botulism toxin injection for bilateral lower extremity spasticity, gait abnormality, initial evaluation was on September 11 2016.   This is the first time I have seen this patient, I also performed electrical stimulation guided xeomin injection for spastic paraplegia, I used 400 units of xeomin on September 11 2016   She presented with right leg stiffness, spasm, weakness in 2002, was diagnosed with cervical spine disease, had anterior cervical vasectomy and fusion in 2003. Following the surgery, her right leg weakness and spasms significantly improved, however a year later the symptoms returned, she ultimately had second cervical spine surgery in 2006, following second surgery, she only has mild improvement of her symptoms,  Over the years, she continue have progressive worsening spastic bilateral lower extremity, gait abnormality, eventually leading to this diagnosis of relapsing remitting multiple sclerosis,  She was treated with Amy Bray over the past few years, her symptoms has been steady.  We have personally reviewed most recent MRI cervical spine in September 2017, 3 small T2 hyper intensity foci within the spinal cord, adjacent to C5-6 to the left, adjacent to C6-7 to the right, and adjacent to T2, no change compared to previous scan in 2015, evidence of surgical fusion from C3-C6.  MRI of brain showed few scattered T2/FLAIR hyperintensity foci in the subcortical deep matter of the frontal area,  Since Tysarbri infusion, her symptoms overall has been steady, but over the years, she noticed increased bilateral lower extremity muscle spasticity, especially at nighttime, she described her left joint worsens stomach, jerking movement, while ambulating, she noticed bilateral knees rubbing towards each other,  bilateral leg tends to "worse each other. She rely on her walker now.  Previously she had EMG guided Botox injection at Amy Bray for bilateral lower extremity spasticity  with very good result  UPDATE Dec 5th 2018: Responded very well to previous injection September 11, 2016, there was no significant side effect noticed, we used 400 units of xeomin, today she wants to emphasize on left lower extremity spasm, hip flexion,  UPDATE March 23 2017: She responded very well to previous xeomin injection 400 units to bilateral lower extremity, we used 600 units total, with emphasized on bilateral hip adduction, flexion  UPDATE June 24 2017: We used 500 units xeomin today, she denies significant side effect with 600 units last time, insurance maximum 500 units  UPDATE Sept 11 2019: She responded well to previous injection but the benefit only last about 3 months, at the end of the 3 months, she developed increased spasticity of bilateral leg, adduction of bilateral leg  UPDATE July 12 2018: She responded well to previous injection on March 31 2018.  There was no significant side effect noticed, noticed recurrent bilateral lower extremity at the end of injection cycle  UPDATE Sept 30 2020: She did not responded well to previous injections on June, complains of forceful bilateral lower extremity adduction  UPDATE Jan 18 2019: She is practicing water aerobic frequently, which has helped her gait, she noticed increased bilateral lower extremity adduction  UPDATE April 18 2019: She did well with previous injection  UPDATE July 29 2019: She responded very well to previous injection, used Xeomin 400 units,  UPDATE Oct 26 2019: She did well with previous injection, today's emphasizes of bilateral thigh adductor, use 500 units  REVIEW OF SYSTEMS: Full 14 system review of systems performed and notable only for gait abnormality, back pain   ALLERGIES: No Known Allergies  HOME MEDICATIONS: Current Outpatient  Medications  Medication Sig Dispense Refill  . acetaminophen (TYLENOL) 500 MG tablet Take 500 mg by mouth as needed.    . baclofen (LIORESAL) 10 MG tablet TAKE 2 TABLETS BY MOUTH THREE TIMES DAILY 180 tablet 5  . Cranberry-Vitamin C-Vitamin E (CRANBERRY PLUS VITAMIN C) 4200-20-3 MG-MG-UNIT CAPS Take by mouth.    . Dalfampridine (4-AMINOPYRIDINE) POWD 5 mg by Does not apply route 3 (three) times daily. 5mg  capsules, one tid    . denosumab (PROLIA) 60 MG/ML SOLN injection Inject 60 mg into the skin every 6 (six) months. Administer in upper arm, thigh, or abdomen    . incobotulinumtoxinA (XEOMIN) 100 units SOLR injection Inject 500 Units into the muscle every 3 (three) months.    Marland Kitchen lisinopril (PRINIVIL,ZESTRIL) 10 MG tablet Take 1 tablet by mouth daily.    . natalizumab (TYSABRI) 300 MG/15ML injection Inject into the vein.    Marland Kitchen nortriptyline (PAMELOR) 50 MG capsule Take 1 capsule by mouth daily.    Marland Kitchen oxybutynin (DITROPAN-XL) 5 MG 24 hr tablet Take 5 mg by mouth daily.    . traMADol-acetaminophen (ULTRACET) 37.5-325 MG per tablet Take 1 tablet by mouth daily as needed.    Marland Kitchen UNABLE TO FIND Med Name: CBD oil at bedtime     Current Facility-Administered Medications  Medication Dose Route Frequency Provider Last Rate Last Admin  . incobotulinumtoxinA (XEOMIN) 100 units injection 400 Units  400 Units Intramuscular Q90 days Amy Pacas, MD   400 Units at 07/29/19 1249    PAST MEDICAL HISTORY: Past Medical History:  Diagnosis Date  . Cervical myelopathy ([place])   . Multiple sclerosis ([place])   . Osteoporosis 2018   hip    PAST SURGICAL HISTORY: Past Surgical History:  Procedure Laterality Date  . CERVICAL FUSION  01/2001, 01/2004   C3-C6    FAMILY HISTORY: Family History  Problem Relation Age of Onset  . Transient ischemic attack Mother   . Pancreatic cancer Father   . Breast cancer Cousin     SOCIAL HISTORY:  Social History   Socioeconomic History  . Marital status: Married     Spouse name: Amy Bray  . Number of children: 0  . Years of education: College  . Highest education level: Not on file  Occupational History    Employer: OTHER    Comment: n/a  Tobacco Use  . Smoking status: Former Smoker    Packs/day: 1.00    Years: 15.00    Pack years: 15.00    Types: Cigarettes    Quit date: 01/21/1983    Years since quitting: 36.7  . Smokeless tobacco: Never Used  Substance and Sexual Activity  . Alcohol use: Yes    Alcohol/week: 0.0 standard drinks    Comment: occasionally- wine  . Drug use: No  . Sexual activity: Not on file  Other Topics Concern  . Not on file  Social History Narrative   05/30/19 Patient lives at home with her spouse, who has dementia.   Caffeine Use-none   Social Determinants of Health   Financial Resource Strain:   . Difficulty of Paying Living Expenses: Not on file  Food Insecurity:   . Worried About Charity fundraiser in the Last Year: Not on file  . Ran Out of Food in the Last Year: Not on file  Transportation Needs:   . Film/video editor (Medical): Not on file  . Lack of Transportation (Non-Medical): Not on file  Physical Activity:   . Days of Exercise per Week: Not on file  . Minutes of Exercise per Session: Not on file  Stress:   . Feeling of Stress : Not on file  Social Connections:   . Frequency of Communication with Friends and Family: Not on file  . Frequency of Social Gatherings with Friends and Family: Not on file  . Attends Religious Services: Not on file  . Active Member of Clubs or Organizations: Not on file  . Attends Archivist Meetings: Not on file  . Marital Status: Not on file  Intimate Partner Violence:   . Fear of Current or Ex-Partner: Not on file  . Emotionally Abused: Not on file  . Physically Abused: Not on file  . Sexually Abused: Not on file     PHYSICAL EXAM   Vitals:   10/26/19 1248  BP: 106/84  Pulse: 86  Weight: 99 lb 8 oz (45.1 kg)  Height: 5\' 1"  (1.549 m)   Not  recorded     Body mass index is 18.8 kg/m.  PHYSICAL EXAMNIATION:  MOTOR: She has moderate spasticity of bilateral lower extremity, mild to moderate proximal lower extremity weakness,    GAIT/STANCE: She rely on her walker to get up from seated position, significant bilateral adductor muscle spasm, rubbing her knees together while walking, spastic, unsteady slow cautious gait, bilateral flat feet, difficulty to clear bilateral feet from the floor  DIAGNOSTIC DATA (LABS, IMAGING, TESTING)  - I reviewed patient records, labs, notes, testing and imaging myself where available.   ASSESSMENT AND PLAN  YANESSA HOCEVAR is a 74 y.o. female   Spastic paraparesis.  Relapsing remitting multiple sclerosis with cervical cord involvement, history of spondylitic cervical myelopathy   Under electric stimulation, we have performed xeomin injection, used 500 units today  Right adductor longus 150 Right adductor magnus 100 units    Left adductor longus 150 units  Left adductor magnus 100 units   Patient tolerate the injection well, returned to 3 months for repeat injection   Amy Bray, M.D. Ph.D.  [facility] [address] [place], [place] [address] Ph: [phone] Fax: [phone]  CC: Hulan Fess, MD

## 2019-10-26 NOTE — Progress Notes (Signed)
**  Xeomin 100 units x 5 vials, NDC 0259-1610-01, Lot [id number], Exp 12/2020, office supply.//mck,rn**

## 2019-11-01 DIAGNOSIS — Z23 Encounter for immunization: Secondary | ICD-10-CM | POA: Diagnosis not present

## 2019-11-02 DIAGNOSIS — M5137 Other intervertebral disc degeneration, lumbosacral region: Secondary | ICD-10-CM | POA: Diagnosis not present

## 2019-11-02 DIAGNOSIS — M47817 Spondylosis without myelopathy or radiculopathy, lumbosacral region: Secondary | ICD-10-CM | POA: Diagnosis not present

## 2019-11-02 DIAGNOSIS — M533 Sacrococcygeal disorders, not elsewhere classified: Secondary | ICD-10-CM | POA: Diagnosis not present

## 2019-11-04 DIAGNOSIS — R8290 Unspecified abnormal findings in urine: Secondary | ICD-10-CM | POA: Diagnosis not present

## 2019-11-04 DIAGNOSIS — S81811A Laceration without foreign body, right lower leg, initial encounter: Secondary | ICD-10-CM | POA: Diagnosis not present

## 2019-11-04 DIAGNOSIS — D649 Anemia, unspecified: Secondary | ICD-10-CM | POA: Diagnosis not present

## 2019-11-04 DIAGNOSIS — Z23 Encounter for immunization: Secondary | ICD-10-CM | POA: Diagnosis not present

## 2019-11-07 ENCOUNTER — Other Ambulatory Visit: Payer: Self-pay | Admitting: Family Medicine

## 2019-11-07 DIAGNOSIS — Z1231 Encounter for screening mammogram for malignant neoplasm of breast: Secondary | ICD-10-CM

## 2019-11-09 DIAGNOSIS — G8929 Other chronic pain: Secondary | ICD-10-CM | POA: Diagnosis not present

## 2019-11-09 DIAGNOSIS — I10 Essential (primary) hypertension: Secondary | ICD-10-CM | POA: Diagnosis not present

## 2019-11-09 DIAGNOSIS — M810 Age-related osteoporosis without current pathological fracture: Secondary | ICD-10-CM | POA: Diagnosis not present

## 2019-11-16 ENCOUNTER — Telehealth: Payer: Self-pay | Admitting: *Deleted

## 2019-11-16 ENCOUNTER — Other Ambulatory Visit: Payer: Self-pay | Admitting: *Deleted

## 2019-11-16 DIAGNOSIS — G35 Multiple sclerosis: Secondary | ICD-10-CM | POA: Diagnosis not present

## 2019-11-16 NOTE — Telephone Encounter (Signed)
Patient in office for infusion, had CBC w/diff and JCV labs drawn. JCV blood specimen placed in [facility] lock box for pick up.

## 2019-11-16 NOTE — Addendum Note (Signed)
Addended by: Inis Sizer D on: 11/16/2019 02:49 PM   Modules accepted: Orders

## 2019-11-17 LAB — CBC WITH DIFFERENTIAL/PLATELET
Basophils Absolute: 0 10*3/uL (ref 0.0–0.2)
Basos: 1 %
EOS (ABSOLUTE): 0.1 10*3/uL (ref 0.0–0.4)
Eos: 2 %
Hematocrit: 37.6 % (ref 34.0–46.6)
Hemoglobin: 12.4 g/dL (ref 11.1–15.9)
Immature Grans (Abs): 0.1 10*3/uL (ref 0.0–0.1)
Immature Granulocytes: 1 %
Lymphocytes Absolute: 2.3 10*3/uL (ref 0.7–3.1)
Lymphs: 35 %
MCH: 32.7 pg (ref 26.6–33.0)
MCHC: 33 g/dL (ref 31.5–35.7)
MCV: 99 fL — ABNORMAL HIGH (ref 79–97)
Monocytes Absolute: 0.6 10*3/uL (ref 0.1–0.9)
Monocytes: 9 %
Neutrophils Absolute: 3.5 10*3/uL (ref 1.4–7.0)
Neutrophils: 52 %
Platelets: 308 10*3/uL (ref 150–450)
RBC: 3.79 x10E6/uL (ref 3.77–5.28)
RDW: 11.7 % (ref 11.7–15.4)
WBC: 6.6 10*3/uL (ref 3.4–10.8)

## 2019-11-21 ENCOUNTER — Encounter: Payer: Self-pay | Admitting: *Deleted

## 2019-11-24 LAB — STRATIFY JCV AB (W/ INDEX) W/ RFLX
Index Value: 0.12
Stratify JCV (TM) Ab w/Reflex Inhibition: NEGATIVE

## 2019-11-29 ENCOUNTER — Encounter: Payer: Self-pay | Admitting: *Deleted

## 2019-11-30 DIAGNOSIS — D649 Anemia, unspecified: Secondary | ICD-10-CM | POA: Diagnosis not present

## 2019-12-09 ENCOUNTER — Other Ambulatory Visit: Payer: Self-pay

## 2019-12-09 ENCOUNTER — Ambulatory Visit
Admission: RE | Admit: 2019-12-09 | Discharge: 2019-12-09 | Disposition: A | Discharge status: Home / Self Care | Payer: Medicare Other | Source: Ambulatory Visit | Attending: Family Medicine | Admitting: Family Medicine

## 2019-12-09 DIAGNOSIS — Z1231 Encounter for screening mammogram for malignant neoplasm of breast: Secondary | ICD-10-CM

## 2019-12-14 ENCOUNTER — Telehealth: Payer: Self-pay | Admitting: Neurology

## 2019-12-14 ENCOUNTER — Other Ambulatory Visit: Payer: Self-pay | Admitting: Neurology

## 2019-12-14 ENCOUNTER — Ambulatory Visit (INDEPENDENT_AMBULATORY_CARE_PROVIDER_SITE_OTHER): Payer: Medicare Other | Admitting: Neurology

## 2019-12-14 ENCOUNTER — Encounter: Payer: Self-pay | Admitting: Neurology

## 2019-12-14 ENCOUNTER — Telehealth: Payer: Self-pay | Admitting: *Deleted

## 2019-12-14 DIAGNOSIS — G35 Multiple sclerosis: Secondary | ICD-10-CM | POA: Diagnosis not present

## 2019-12-14 DIAGNOSIS — R269 Unspecified abnormalities of gait and mobility: Secondary | ICD-10-CM | POA: Diagnosis not present

## 2019-12-14 DIAGNOSIS — G8250 Quadriplegia, unspecified: Secondary | ICD-10-CM | POA: Diagnosis not present

## 2019-12-14 NOTE — Telephone Encounter (Signed)
Placed JCV lab in [facility] lock box for routine lab pick up. Results pending. 

## 2019-12-14 NOTE — Telephone Encounter (Signed)
Just an FYI  Medicare/aarp no auth.   Patient is scheduled at [facility] for Saturday 12/17/19 to arrive at 6:45 AM. She will have both MRI's.  I gave her the address of [place]. The patient is aware of time and day.

## 2019-12-14 NOTE — Progress Notes (Signed)
[facility]  PATIENT: Amy Bray DOB: [birth year]     HISTORICAL  CHIEF COMPLAINT:  Chief Complaint  Patient presents with  . Multiple Sclerosis    On Tysabri    HISTORY OF PRESENT ILLNESS:  Today, she came in for her Tysabri infusion.  Over the last month she has had progressive difficulties with gait and left-sided strength and she came in a wheelchair today (usually is able to walk with assistance) she states that the change on the left has been progressive over the last month and there was no sudden change.  She did have a urinary tract infection earlier last month that has been treated and she continued to progress even after being placed on antibiotics.  She had Botox injections into the legs about 6 weeks ago but she reports that the weakness was progressive over the last month and not associated with the injections.  Her last JCV antibody was about 6 months ago and was negative.     ALLERGIES: No Known Allergies  HOME MEDICATIONS:  Current Outpatient Medications:  .  acetaminophen (TYLENOL) 500 MG tablet, Take 500 mg by mouth as needed., Disp: , Rfl:  .  baclofen (LIORESAL) 10 MG tablet, TAKE 2 TABLETS BY MOUTH THREE TIMES DAILY, Disp: 180 tablet, Rfl: 5 .  Cranberry-Vitamin C-Vitamin E (CRANBERRY PLUS VITAMIN C) 4200-20-3 MG-MG-UNIT CAPS, Take by mouth., Disp: , Rfl:  .  Dalfampridine (4-AMINOPYRIDINE) POWD, 5 mg by Does not apply route 3 (three) times daily. 5mg  capsules, one tid, Disp: , Rfl:  .  denosumab (PROLIA) 60 MG/ML SOLN injection, Inject 60 mg into the skin every 6 (six) months. Administer in upper arm, thigh, or abdomen, Disp: , Rfl:  .  incobotulinumtoxinA (XEOMIN) 100 units SOLR injection, Inject 500 Units into the muscle every 3 (three) months., Disp: , Rfl:  .  lisinopril (PRINIVIL,ZESTRIL) 10 MG tablet, Take 1 tablet by mouth daily., Disp: , Rfl:  .  natalizumab (TYSABRI) 300 MG/15ML injection, Inject into the vein., Disp: , Rfl:   .  nortriptyline (PAMELOR) 50 MG capsule, Take 1 capsule by mouth daily., Disp: , Rfl:  .  oxybutynin (DITROPAN-XL) 5 MG 24 hr tablet, Take 5 mg by mouth daily., Disp: , Rfl:  .  traMADol-acetaminophen (ULTRACET) 37.5-325 MG per tablet, Take 1 tablet by mouth daily as needed., Disp: , Rfl:  .  UNABLE TO FIND, Med Name: CBD oil at bedtime, Disp: , Rfl:   Current Facility-Administered Medications:  .  incobotulinumtoxinA (XEOMIN) 100 units injection 400 Units, 400 Units, Intramuscular, Q90 days, Marcial Pacas, MD, 400 Units at 07/29/19 1249 .  incobotulinumtoxinA (XEOMIN) 100 units injection 500 Units, 500 Units, Intramuscular, Q90 days, Marcial Pacas, MD, 500 Units at 10/26/19 1345  PAST MEDICAL HISTORY: Past Medical History:  Diagnosis Date  . Cervical myelopathy ([place])   . Multiple sclerosis ([place])   . Osteoporosis 2018   hip    PAST SURGICAL HISTORY: Past Surgical History:  Procedure Laterality Date  . CERVICAL FUSION  01/2001, 01/2004   C3-C6    FAMILY HISTORY: Family History  Problem Relation Age of Onset  . Transient ischemic attack Mother   . Pancreatic cancer Father   . Breast cancer Cousin     SOCIAL HISTORY:  Social History   Socioeconomic History  . Marital status: Married    Spouse name: Amy Bray  . Number of children: 0  . Years of education: College  . Highest education level: Not on file  Occupational History    Employer: OTHER    Comment: n/a  Tobacco Use  . Smoking status: Former Smoker    Packs/day: 1.00    Years: 15.00    Pack years: 15.00    Types: Cigarettes    Quit date: 01/21/1983    Years since quitting: 36.9  . Smokeless tobacco: Never Used  Substance and Sexual Activity  . Alcohol use: Yes    Alcohol/week: 0.0 standard drinks    Comment: occasionally- wine  . Drug use: No  . Sexual activity: Not on file  Other Topics Concern  . Not on file  Social History Narrative   05/30/19 Patient lives at home with her spouse, who has dementia.    Caffeine Use-none   Social Determinants of Health   Financial Resource Strain:   . Difficulty of Paying Living Expenses: Not on file  Food Insecurity:   . Worried About Charity fundraiser in the Last Year: Not on file  . Ran Out of Food in the Last Year: Not on file  Transportation Needs:   . Lack of Transportation (Medical): Not on file  . Lack of Transportation (Non-Medical): Not on file  Physical Activity:   . Days of Exercise per Week: Not on file  . Minutes of Exercise per Session: Not on file  Stress:   . Feeling of Stress : Not on file  Social Connections:   . Frequency of Communication with Friends and Family: Not on file  . Frequency of Social Gatherings with Friends and Family: Not on file  . Attends Religious Services: Not on file  . Active Member of Clubs or Organizations: Not on file  . Attends Archivist Meetings: Not on file  . Marital Status: Not on file  Intimate Partner Violence:   . Fear of Current or Ex-Partner: Not on file  . Emotionally Abused: Not on file  . Physically Abused: Not on file  . Sexually Abused: Not on file     PHYSICAL EXAM  Vital signs are on flowsheet for infusion   General: The patient is well-developed and well-nourished and in no acute distress  Skin: Extremities are without rash or edema.  Musculoskeletal: Neck has good range of motion.    Neurologic Exam  Mental status: The patient is alert and oriented x 3 at the time of the examination. The patient has apparent normal recent and remote memory, with an apparently normal attention span and concentration ability.   Speech is normal.  Cranial nerves: Extraocular movements are full.  Facial strength was normal  Motor:  Muscle bulk is normal.   Tone is normal.  Strength was 4+/5 in the left triceps and finger extensors and 5/5 elsewhere in the arm.  Strength was 3/5 in the right hip flexors and 2+/5 on the left.  Knee extension was 5/5 on the right and 4/5 on the  left.  Ankle extension was 4 -/5 in the right and 2+/5 on the left.   Sensory: Sensory testing is intact to touch  Coordination: Cerebellar testing reveals reduced left finger-nose-finger.  Heel-to-shin could not be performed well.  .  Gait and station: She could not stand without strong bilateral lateral support and cannot take steps  Reflexes: Deep tendon reflexes are increased at the knees and normal in the arms    DIAGNOSTIC DATA (LABS, IMAGING, TESTING) - I reviewed patient records, labs, notes, testing and imaging myself where available.  Lab Results  Component Value Date  WBC 6.6 11/16/2019   HGB 12.4 11/16/2019   HCT 37.6 11/16/2019   MCV 99 (H) 11/16/2019   PLT 308 11/16/2019      Component Value Date/Time   NA 141 05/30/2019 1638   K 4.1 05/30/2019 1638   CL 104 05/30/2019 1638   CO2 25 05/30/2019 1638   GLUCOSE 87 05/30/2019 1638   BUN 17 05/30/2019 1638   CREATININE 0.66 05/30/2019 1638   CALCIUM 9.2 05/30/2019 1638   PROT 6.4 05/30/2019 1638   ALBUMIN 4.7 05/30/2019 1638   AST 19 05/30/2019 1638   ALT 14 05/30/2019 1638   ALKPHOS 42 05/30/2019 1638   BILITOT 0.2 05/30/2019 1638   GFRNONAA 89 05/30/2019 1638   GFRAA 102 05/30/2019 1638       ASSESSMENT AND PLAN  Multiple sclerosis (HCC) - Plan: CBC with Differential/Platelet, Stratify JCV Antibody Test ([facility])  Quadriplegia, unspecified (HCC)  Gait disturbance   1.  She has become weaker over the last month, and is now unable to walk even with support.  Most likely, her worsening is due to deconditioning, recent UTI.  However, because she is on Tysabri and there is a risk of PML and symptoms have progressed slowly over weeks, it is essential that we rule out PML before allowing her to get her next Tysabri infusion.  I have set up an urgent MRI of the head and cervical spine to rule out PML and to assess for the possibility of other pathology that could lead to progressive weakness over the last  month 2.   If the MRI does not show any changes consistent with PML we will get her rescheduled for her Tysabri infusion.    Emilyn Ruble A. Felecia Shelling, MD, PhD, Charlynn Grimes 29/56/2130, 8:65 PM Certified in Neurology, Clinical Neurophysiology, Sleep Medicine, Pain Medicine and Neuroimaging  [facility] [address], [place],  [address] [phone]

## 2019-12-15 LAB — CBC WITH DIFFERENTIAL/PLATELET
Basophils Absolute: 0 10*3/uL (ref 0.0–0.2)
Basos: 1 %
EOS (ABSOLUTE): 0.1 10*3/uL (ref 0.0–0.4)
Eos: 2 %
Hematocrit: 36.5 % (ref 34.0–46.6)
Hemoglobin: 11.8 g/dL (ref 11.1–15.9)
Immature Grans (Abs): 0 10*3/uL (ref 0.0–0.1)
Immature Granulocytes: 1 %
Lymphocytes Absolute: 2.2 10*3/uL (ref 0.7–3.1)
Lymphs: 39 %
MCH: 32 pg (ref 26.6–33.0)
MCHC: 32.3 g/dL (ref 31.5–35.7)
MCV: 99 fL — ABNORMAL HIGH (ref 79–97)
Monocytes Absolute: 0.7 10*3/uL (ref 0.1–0.9)
Monocytes: 11 %
NRBC: 1 % — ABNORMAL HIGH (ref 0–0)
Neutrophils Absolute: 2.7 10*3/uL (ref 1.4–7.0)
Neutrophils: 46 %
Platelets: 305 10*3/uL (ref 150–450)
RBC: 3.69 x10E6/uL — ABNORMAL LOW (ref 3.77–5.28)
RDW: 12.3 % (ref 11.7–15.4)
WBC: 5.7 10*3/uL (ref 3.4–10.8)

## 2019-12-17 ENCOUNTER — Ambulatory Visit (HOSPITAL_COMMUNITY)
Admission: RE | Admit: 2019-12-17 | Discharge: 2019-12-17 | Disposition: A | Discharge status: Home / Self Care | Payer: Medicare Other | Source: Ambulatory Visit | Attending: Neurology | Admitting: Neurology

## 2019-12-17 DIAGNOSIS — G35 Multiple sclerosis: Secondary | ICD-10-CM

## 2019-12-17 DIAGNOSIS — R9082 White matter disease, unspecified: Secondary | ICD-10-CM | POA: Diagnosis not present

## 2019-12-17 DIAGNOSIS — M4312 Spondylolisthesis, cervical region: Secondary | ICD-10-CM | POA: Diagnosis not present

## 2019-12-17 DIAGNOSIS — M47812 Spondylosis without myelopathy or radiculopathy, cervical region: Secondary | ICD-10-CM | POA: Diagnosis not present

## 2019-12-17 DIAGNOSIS — I6389 Other cerebral infarction: Secondary | ICD-10-CM | POA: Diagnosis not present

## 2019-12-17 DIAGNOSIS — M4802 Spinal stenosis, cervical region: Secondary | ICD-10-CM | POA: Diagnosis not present

## 2019-12-17 MED ORDER — GADOBUTROL 1 MMOL/ML IV SOLN
4.5000 mL | Freq: Once | INTRAVENOUS | Status: AC | PRN
  Administered 2019-12-17: 4.5 mL via INTRAVENOUS

## 2019-12-19 ENCOUNTER — Telehealth: Payer: Self-pay | Admitting: *Deleted

## 2019-12-19 NOTE — Telephone Encounter (Signed)
I called the patient and provided her with the MRI results. She verbalized understanding of the findings.   I spoke to [facility]. Her Tysabri infusion has been reschedule to 12/21/19 at 2pm.  JCV was drawn on 12/14/19. Results still pending.

## 2019-12-19 NOTE — Telephone Encounter (Signed)
-----   Message from Britt Bottom, MD sent at 12/17/2019 11:24 AM EST ----- Dr. [name] patient.  (Due to progressive neurologic symptoms the Tysabri infusion last week was held and MRi ordered)  Please let her know that the MRIs look ok The spinal cord was unchanged.   The brain MRi shows a couple small spots not present in 2020 but no evidence of PML   Therefore, she can get scheduled for infusion.

## 2019-12-21 DIAGNOSIS — G35 Multiple sclerosis: Secondary | ICD-10-CM | POA: Diagnosis not present

## 2019-12-22 NOTE — Telephone Encounter (Signed)
JCV ab drawn on 12/14/19 negative, index: 0.06

## 2019-12-28 DIAGNOSIS — Z23 Encounter for immunization: Secondary | ICD-10-CM | POA: Diagnosis not present

## 2019-12-30 DIAGNOSIS — G8929 Other chronic pain: Secondary | ICD-10-CM | POA: Diagnosis not present

## 2019-12-30 DIAGNOSIS — M810 Age-related osteoporosis without current pathological fracture: Secondary | ICD-10-CM | POA: Diagnosis not present

## 2019-12-30 DIAGNOSIS — I10 Essential (primary) hypertension: Secondary | ICD-10-CM | POA: Diagnosis not present

## 2020-01-18 DIAGNOSIS — G35 Multiple sclerosis: Secondary | ICD-10-CM | POA: Diagnosis not present

## 2020-01-25 ENCOUNTER — Encounter: Payer: Self-pay | Admitting: *Deleted

## 2020-01-25 ENCOUNTER — Telehealth: Payer: Self-pay | Admitting: *Deleted

## 2020-01-25 DIAGNOSIS — G35 Multiple sclerosis: Secondary | ICD-10-CM

## 2020-01-25 DIAGNOSIS — G8220 Paraplegia, unspecified: Secondary | ICD-10-CM

## 2020-01-25 DIAGNOSIS — R269 Unspecified abnormalities of gait and mobility: Secondary | ICD-10-CM

## 2020-01-25 NOTE — Telephone Encounter (Signed)
Per Dr Marjory Lies, called [facility], spoke with Toni Amend and advised Dr Marjory Lies would like to refer patient to them for Tysabri home infusions. She advised to fax Demographics, order,  clinic notes, insurance. They will contact us with updates.  Called patient, LVM requesting she call back.

## 2020-01-26 NOTE — Telephone Encounter (Signed)
Patient returned call, and I advised her of referral placed for home Tysabri infusions. She stated she received VM from them but it was very difficult to understand. She is agreeable to plan, will let us know if she hears from them again.

## 2020-01-26 NOTE — Telephone Encounter (Signed)
Faxed referral order, recent office note, recent MRIs, insurance and demographics to [facility]. Received confirmation.

## 2020-01-30 DIAGNOSIS — M47817 Spondylosis without myelopathy or radiculopathy, lumbosacral region: Secondary | ICD-10-CM | POA: Diagnosis not present

## 2020-01-30 DIAGNOSIS — M5137 Other intervertebral disc degeneration, lumbosacral region: Secondary | ICD-10-CM | POA: Diagnosis not present

## 2020-01-31 ENCOUNTER — Telehealth: Payer: Self-pay | Admitting: Diagnostic Neuroimaging

## 2020-01-31 NOTE — Telephone Encounter (Signed)
Called patient, LVM advising her I got message about Tysabri infusion and I advised Liane RN, infusion ste and Dr Leta Baptist. Advised no call back needed unless she had questions, concerns.

## 2020-01-31 NOTE — Telephone Encounter (Signed)
Pt called back and stated that the insurance will not cover the Tysabri Infusion at home.

## 2020-01-31 NOTE — Telephone Encounter (Signed)
Raquel Sarna from [facility] stated that pt.'s infusion would be at the infusion suite at local brand. She stated pt. declined,  that she wants to stay with doctor for infusion.   Best contact for Raquel Sarna is [phone]

## 2020-01-31 NOTE — Telephone Encounter (Signed)
Noted  

## 2020-02-08 ENCOUNTER — Ambulatory Visit (INDEPENDENT_AMBULATORY_CARE_PROVIDER_SITE_OTHER): Payer: Medicare Other | Admitting: Neurology

## 2020-02-08 ENCOUNTER — Encounter: Payer: Self-pay | Admitting: Neurology

## 2020-02-08 VITALS — BP 98/67 | HR 97 | Ht 61.0 in | Wt 97.8 lb

## 2020-02-08 DIAGNOSIS — G35 Multiple sclerosis: Secondary | ICD-10-CM

## 2020-02-08 DIAGNOSIS — G8250 Quadriplegia, unspecified: Secondary | ICD-10-CM

## 2020-02-08 NOTE — Telephone Encounter (Signed)
Update - Amy Bray from [facility] Her infusions were denied for her to have at home . Patient stated if she was denied buy [facility] she did not want to try any more home health services she wanted to continue to come here to [facility] where her insurance approves her to come. I called patient and left her detailed message that she had been denied buy [facility] .   Patient denied buy [facility] and [facility] .

## 2020-02-08 NOTE — Telephone Encounter (Signed)
Noted, I have informed the infusion suite (Liane) of this.

## 2020-02-08 NOTE — Progress Notes (Signed)
PATIENT: Amy Bray DOB: July 02, 1945    HISTORICAL  Amy Bray is a 75 years old right-handed female , seen in refer by my colleague Dr. Leta Baptist for evaluation of EMG guided botulism toxin injection for bilateral lower extremity spasticity, gait abnormality, initial evaluation was on September 11 2016.   This is the first time I have seen this patient, I also performed electrical stimulation guided xeomin injection for spastic paraplegia, I used 400 units of xeomin on September 11 2016   She presented with right leg stiffness, spasm, weakness in 2002, was diagnosed with cervical spine disease, had anterior cervical vasectomy and fusion in 2003. Following the surgery, her right leg weakness and spasms significantly improved, however a year later the symptoms returned, she ultimately had second cervical spine surgery in 2006, following second surgery, she only has mild improvement of her symptoms,  Over the years, she continue have progressive worsening spastic bilateral lower extremity, gait abnormality, eventually leading to this diagnosis of relapsing remitting multiple sclerosis,  She was treated with Amy Bray over the past few years, her symptoms has been steady.  We have personally reviewed most recent MRI cervical spine in September 2017, 3 small T2 hyper intensity foci within the spinal cord, adjacent to C5-6 to the left, adjacent to C6-7 to the right, and adjacent to T2, no change compared to previous scan in 2015, evidence of surgical fusion from C3-C6.  MRI of brain showed few scattered T2/FLAIR hyperintensity foci in the subcortical deep matter of the frontal area,  Since Tysarbri infusion, her symptoms overall has been steady, but over the years, she noticed increased bilateral lower extremity muscle spasticity, especially at nighttime, she described her left joint worsens stomach, jerking movement, while ambulating, she noticed bilateral knees rubbing towards each other,  bilateral leg tends to "worse each other. She rely on her walker now.  Previously she had EMG guided Botox injection at [facility] for bilateral lower extremity spasticity  with very good result  UPDATE Dec 5th 2018: Responded very well to previous injection September 11, 2016, there was no significant side effect noticed, we used 400 units of xeomin, today she wants to emphasize on left lower extremity spasm, hip flexion,  UPDATE March 23 2017: She responded very well to previous xeomin injection 400 units to bilateral lower extremity, we used 600 units total, with emphasized on bilateral hip adduction, flexion  UPDATE June 24 2017: We used 500 units xeomin today, she denies significant side effect with 600 units last time, insurance maximum 500 units  UPDATE Sept 11 2019: She responded well to previous injection but the benefit only last about 3 months, at the end of the 3 months, she developed increased spasticity of bilateral leg, adduction of bilateral leg  UPDATE July 12 2018: She responded well to previous injection on March 31 2018.  There was no significant side effect noticed, noticed recurrent bilateral lower extremity at the end of injection cycle  UPDATE Sept 30 2020: She did not responded well to previous injections on June, complains of forceful bilateral lower extremity adduction  UPDATE Jan 18 2019: She is practicing water aerobic frequently, which has helped her gait, she noticed increased bilateral lower extremity adduction  UPDATE April 18 2019: She did well with previous injection  UPDATE July 29 2019: She responded very well to previous injection, used Xeomin 400 units,  UPDATE Oct 26 2019: She did well with previous injection, today's emphasizes of bilateral thigh adductor, use 500 units  Update February 08, 2020 She had a stressful few months, moved her mother to assisted living, sold her mother's house, her husband is a dementia unit, with distress, she complains of  worsening gait abnormality, was seen by Dr. Felecia Shelling on December 14, 2019  I personally reviewed repeat MRI of the brain and cervical spine with without contrast December 17, 2019: There was no significant change compared to previous MRI, low brain lesion load, no contrast-enhancement  MRI of cervical spine showed possible focal T2 hyperintensity in the left cord at C5 versus artifact, no cord enhancement, evidence of C3-6 ACDF, adjacent level anterolisthesis of C6 on C7, with mild canal stenosis, variable degree of foraminal narrowing  She reported that her functional level has now recovered to her baseline, continue to report benefit for repeat botulism toxin injection for bilateral lower extremity spasticity, otherwise her knees rubbing each other, increased difficulty clearing her feet from the floor, she continued her water aerobic exercise weekly  She is also taking baclofen 10 mg 2 tablets 3 times a day for spasticity,  REVIEW OF SYSTEMS: Full 14 system review of systems performed and notable only for gait abnormality, back pain   ALLERGIES: No Known Allergies  HOME MEDICATIONS: Current Outpatient Medications  Medication Sig Dispense Refill  . acetaminophen (TYLENOL) 500 MG tablet Take 500 mg by mouth as needed.    . baclofen (LIORESAL) 10 MG tablet TAKE 2 TABLETS BY MOUTH THREE TIMES DAILY 180 tablet 5  . Cranberry-Vitamin C-Vitamin E 4200-20-3 MG-MG-UNIT CAPS Take by mouth.    . Dalfampridine (4-AMINOPYRIDINE) POWD 5 mg by Does not apply route 3 (three) times daily. 5mg  capsules, one tid    . denosumab (PROLIA) 60 MG/ML SOLN injection Inject 60 mg into the skin every 6 (six) months. Administer in upper arm, thigh, or abdomen    . incobotulinumtoxinA (XEOMIN) 100 units SOLR injection Inject 500 Units into the muscle every 3 (three) months.    Marland Kitchen lisinopril (PRINIVIL,ZESTRIL) 10 MG tablet Take 1 tablet by mouth daily.    . natalizumab (TYSABRI) 300 MG/15ML injection Inject into the vein.     Marland Kitchen nortriptyline (PAMELOR) 50 MG capsule Take 1 capsule by mouth daily.    Marland Kitchen oxybutynin (DITROPAN-XL) 5 MG 24 hr tablet Take 5 mg by mouth daily.    . traMADol-acetaminophen (ULTRACET) 37.5-325 MG per tablet Take 1 tablet by mouth daily as needed.    Marland Kitchen UNABLE TO FIND Med Name: CBD oil at bedtime     No current facility-administered medications for this visit.    PAST MEDICAL HISTORY: Past Medical History:  Diagnosis Date  . Cervical myelopathy ([place])   . Multiple sclerosis ([place])   . Osteoporosis 2018   hip    PAST SURGICAL HISTORY: Past Surgical History:  Procedure Laterality Date  . CERVICAL FUSION  01/2001, 01/2004   C3-C6    FAMILY HISTORY: Family History  Problem Relation Age of Onset  . Transient ischemic attack Mother   . Pancreatic cancer Father   . Breast cancer Cousin     SOCIAL HISTORY:  Social History   Socioeconomic History  . Marital status: Married    Spouse name: Amy Bray  . Number of children: 0  . Years of education: College  . Highest education level: Not on file  Occupational History    Employer: OTHER    Comment: n/a  Tobacco Use  . Smoking status: Former Smoker    Packs/day: 1.00    Years: 15.00  Pack years: 15.00    Types: Cigarettes    Quit date: 01/21/1983    Years since quitting: 37.0  . Smokeless tobacco: Never Used  Substance and Sexual Activity  . Alcohol use: Yes    Alcohol/week: 0.0 standard drinks    Comment: occasionally- wine  . Drug use: No  . Sexual activity: Not on file  Other Topics Concern  . Not on file  Social History Narrative   05/30/19 Patient lives at home alone,  Spouse in SNF for  dementia.   Caffeine Use-none   Social Determinants of Health   Financial Resource Strain: Not on file  Food Insecurity: Not on file  Transportation Needs: Not on file  Physical Activity: Not on file  Stress: Not on file  Social Connections: Not on file  Intimate Partner Violence: Not on file     PHYSICAL EXAM    Vitals:   02/08/20 1327  BP: 98/67  Pulse: 97  Weight: 97 lb 12.8 oz (44.4 kg)  Height: 5\' 1"  (1.549 m)   Not recorded     Body mass index is 18.48 kg/m.   PHYSICAL EXAMNIATION:  Gen: NAD, conversant, well nourised, well groomed                     Cardiovascular: Regular rate rhythm, no peripheral edema, warm, nontender. Eyes: Conjunctivae clear without exudates or hemorrhage Neck: Supple, no carotid bruits. Pulmonary: Clear to auscultation bilaterally   NEUROLOGICAL EXAM:  MENTAL STATUS: Speech/Cognition: Awake, alert, normal speech, oriented to history taking and casual conversation.  CRANIAL NERVES: CN II: Visual fields are full to confrontation.  Pupils are round equal and briskly reactive to light. CN III, IV, VI: extraocular movement are normal. No ptosis. CN V: Facial sensation is intact to light touch. CN VII: Face is symmetric with normal eye closure and smile. CN VIII: Hearing is normal to casual conversation CN IX, X: Palate elevates symmetrically. Phonation is normal. CN XI: Head turning and shoulder shrug are intact  SENSORY: Intact to light touch, pinprick, positional and vibratory sensation at fingers and toes.  COORDINATION: There is no trunk or limb ataxia.     MOTOR: She has moderate spasticity of bilateral lower extremity, mild to moderate proximal lower extremity weakness,    GAIT/STANCE: She rely on her walker to get up from seated position, significant bilateral adductor muscle spasm, rubbing her knees together while walking, spastic, unsteady slow cautious gait, bilateral flat feet, difficulty to clear bilateral feet from the floor  DIAGNOSTIC DATA (LABS, IMAGING, TESTING)  - I reviewed patient records, labs, notes, testing and imaging myself where available.   ASSESSMENT AND PLAN  AMARI BURNSWORTH is a 75 y.o. female   Spastic paraparesis.  Relapsing remitting multiple sclerosis   Previously C3-6 ACDF cervical decompression  surgery, likely a component of residual deficit from cervical spondylitic myelopathy  Repeat MRI of the brain showed small supratentorium lesion load, no evidence of PML   no significant cervical cord signal change on MRI, was seen by Dr. Felecia Shelling in November 2021, decided to continue on Tysabri, JC virus 0.1 to negative on November 16, 2019    Under electric stimulation, we have performed xeomin injection, used 500 units today  Right adductor longus 150 Right adductor magnus 100 units    Left adductor longus 150 units  Left adductor magnus 100 units   Patient tolerate the injection well, returned to 3 months for repeat injection   Amy Bray, M.D. Ph.D.  [facility] [address], [place], [place] [address] Ph: [phone] Fax: [phone]  CC: Hulan Fess, MD

## 2020-02-08 NOTE — Progress Notes (Unsigned)
**  Xeomin 100 units x 5 vials, NDC 2162-4469-50, Lot [id number], Exp 07/2021, office supply.//mck**

## 2020-02-09 DIAGNOSIS — G35 Multiple sclerosis: Secondary | ICD-10-CM | POA: Diagnosis not present

## 2020-02-09 DIAGNOSIS — G8250 Quadriplegia, unspecified: Secondary | ICD-10-CM | POA: Diagnosis not present

## 2020-02-09 MED ORDER — INCOBOTULINUMTOXINA 100 UNITS IM SOLR
500.0000 [IU] | INTRAMUSCULAR | Status: DC
  Administered 2020-02-09: 500 [IU] via INTRAMUSCULAR

## 2020-02-14 DIAGNOSIS — N390 Urinary tract infection, site not specified: Secondary | ICD-10-CM | POA: Diagnosis not present

## 2020-02-15 DIAGNOSIS — M810 Age-related osteoporosis without current pathological fracture: Secondary | ICD-10-CM | POA: Diagnosis not present

## 2020-02-15 DIAGNOSIS — I10 Essential (primary) hypertension: Secondary | ICD-10-CM | POA: Diagnosis not present

## 2020-02-15 DIAGNOSIS — G35 Multiple sclerosis: Secondary | ICD-10-CM | POA: Diagnosis not present

## 2020-02-15 DIAGNOSIS — G8929 Other chronic pain: Secondary | ICD-10-CM | POA: Diagnosis not present

## 2020-02-17 DIAGNOSIS — M810 Age-related osteoporosis without current pathological fracture: Secondary | ICD-10-CM | POA: Diagnosis not present

## 2020-02-19 IMAGING — MG DIGITAL SCREENING BILATERAL MAMMOGRAM WITH TOMO AND CAD
6 of 10 series · 6 of 30 positions shown · non-contrast
Comparison: Previous exam(s).

CLINICAL DATA: Screening.

EXAM:
DIGITAL SCREENING BILATERAL MAMMOGRAM WITH TOMO AND CAD

[L CC synth-2D]
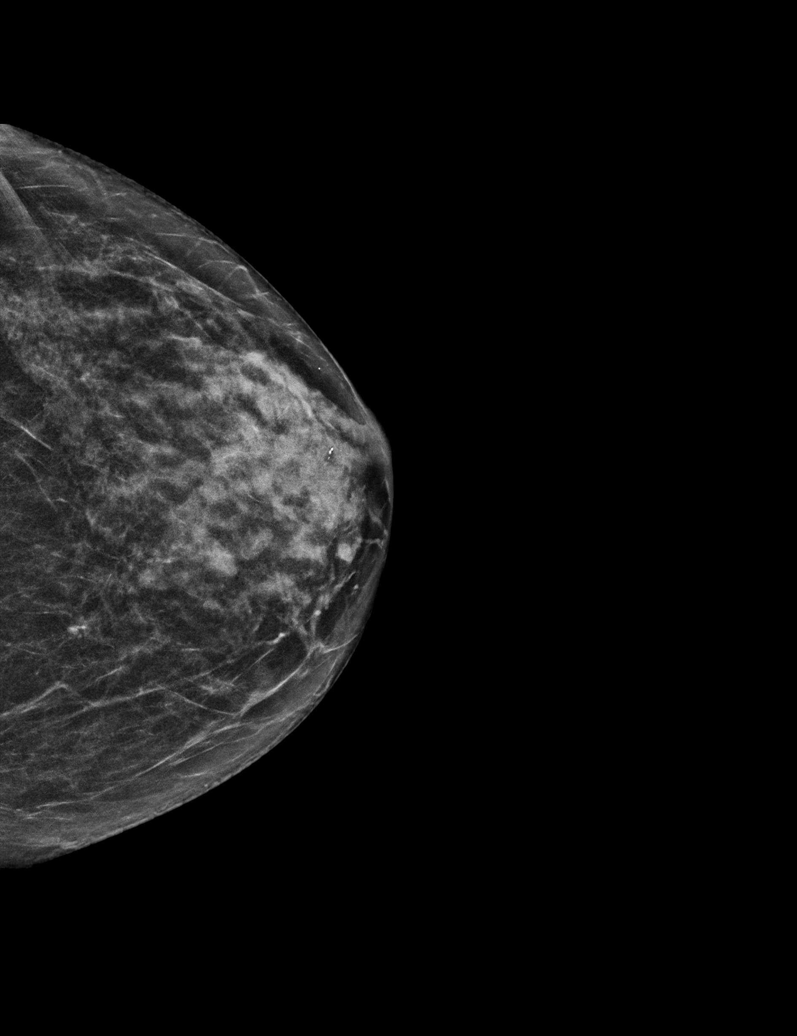

[L MLO synth-2D]
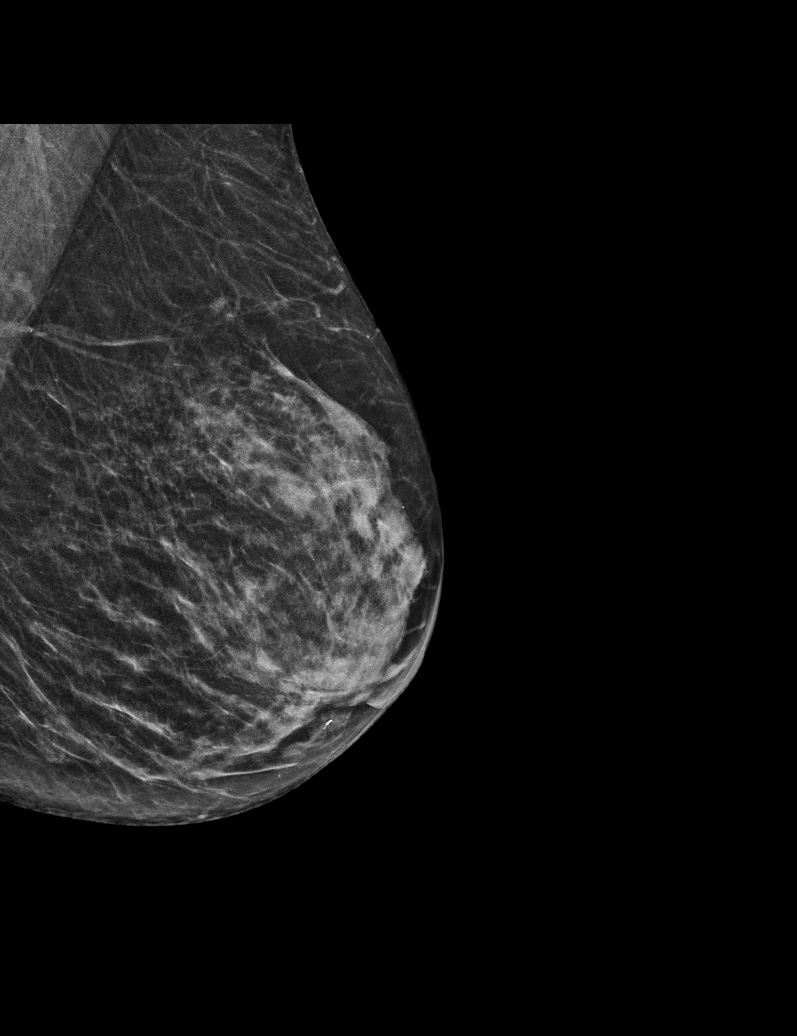

[R MLO synth-2D (1 of 2)]
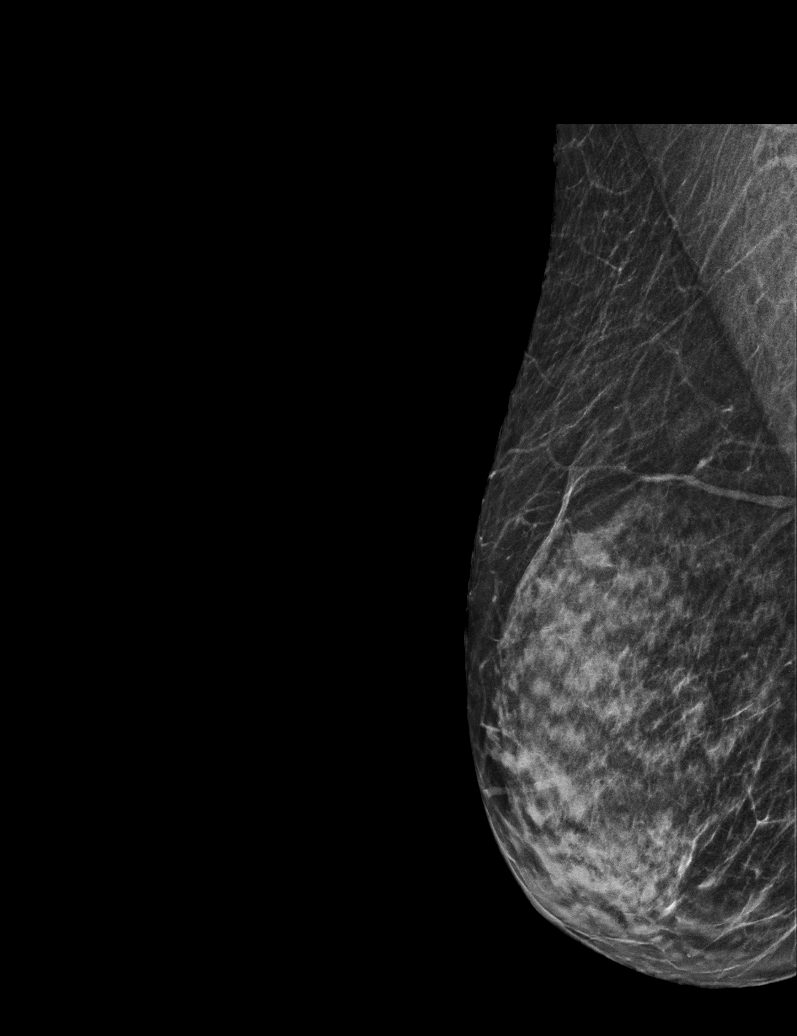

[R CC synth-2D]
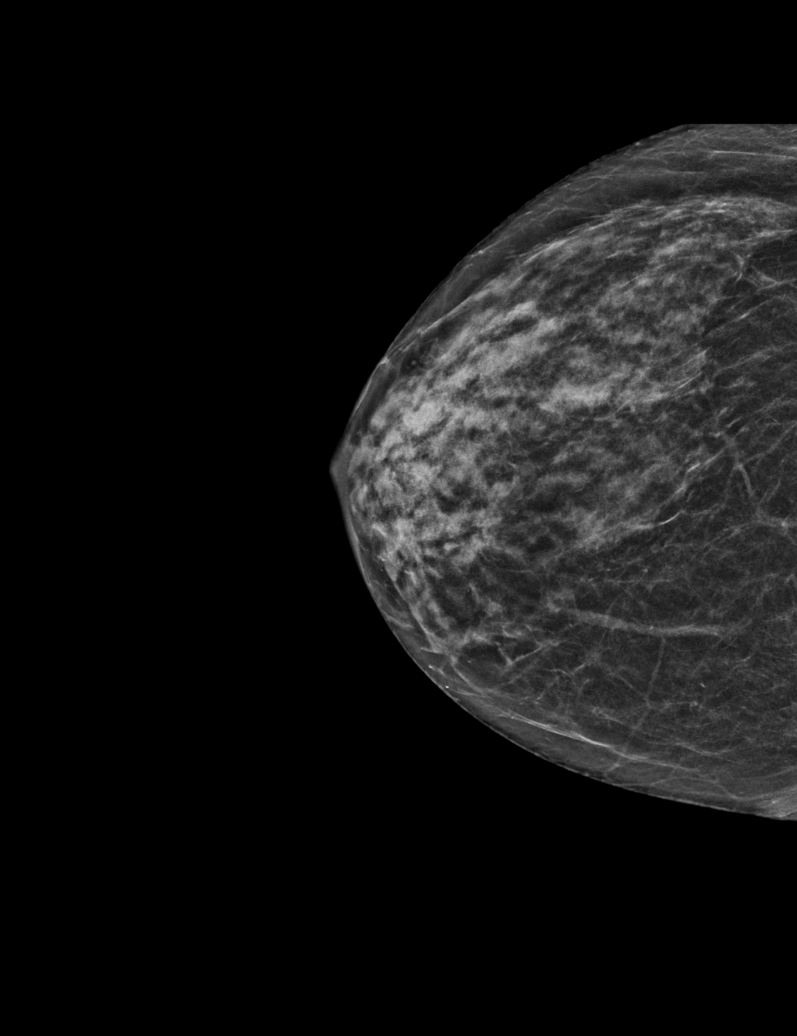

[R MLO synth-2D (2 of 2)]
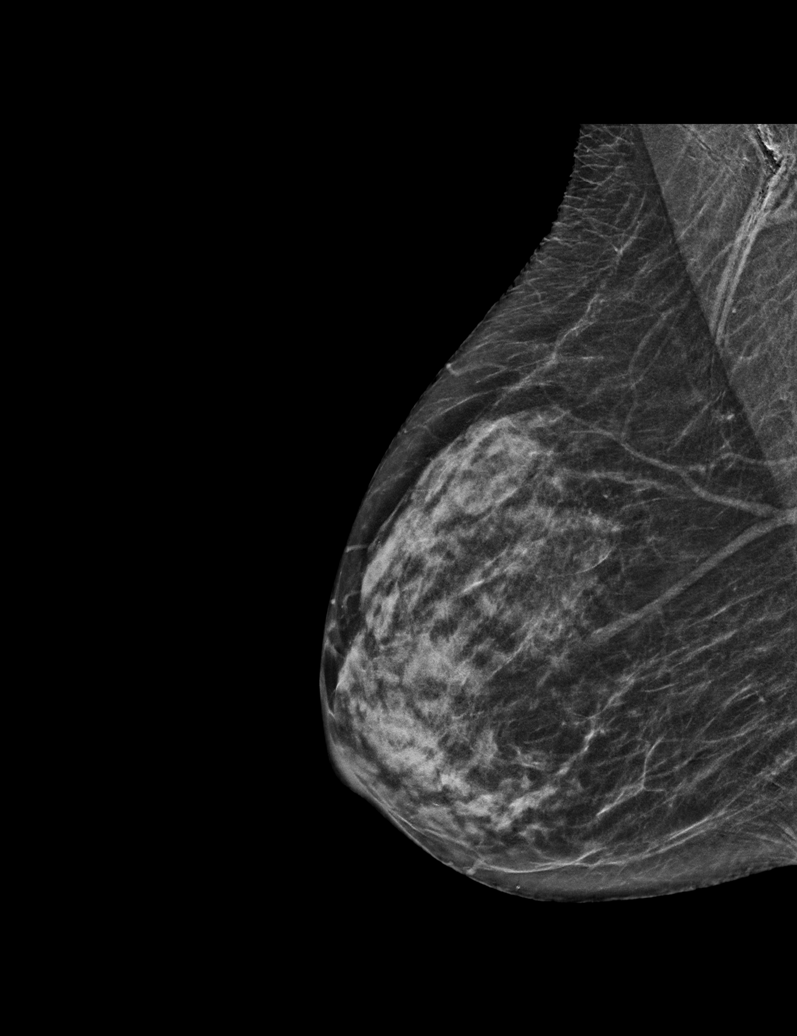

[R MLO tomo · tomo slice 25/48.0]
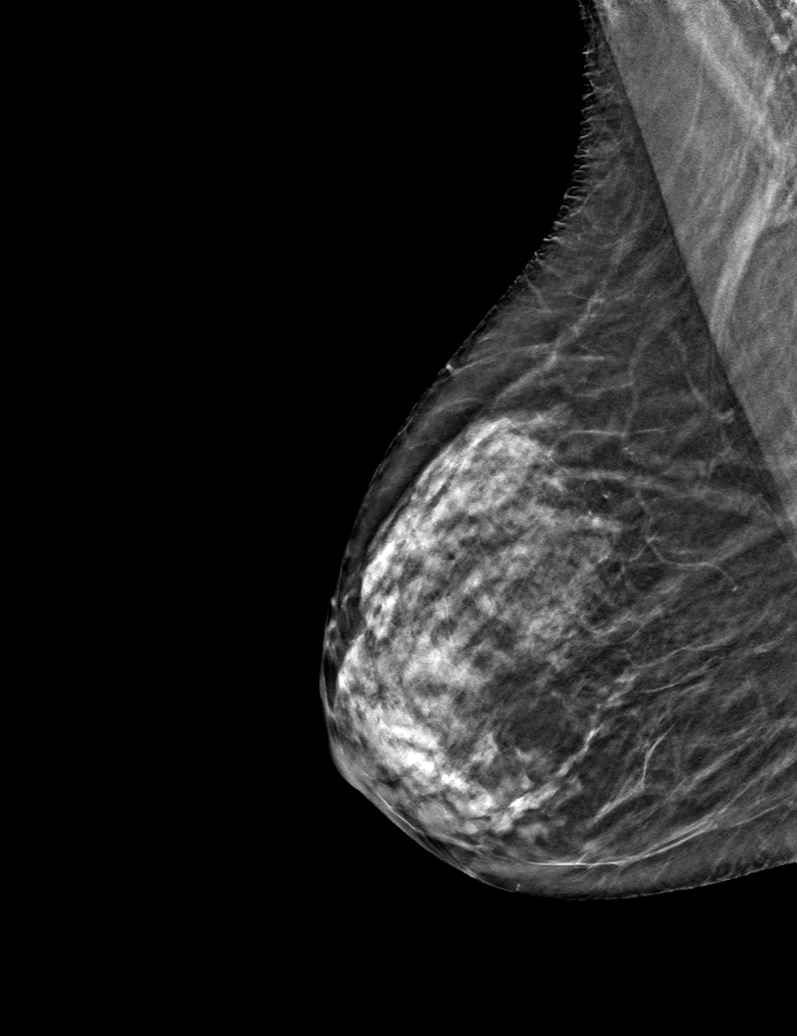

[6 of 30 positions shown; findings below may reference images not displayed]

ACR Breast Density Category c: The breast tissue is heterogeneously
dense, which may obscure small masses.
FINDINGS: There are no findings suspicious for malignancy. Images were
processed with CAD.
IMPRESSION: No mammographic evidence of malignancy. A result letter of this
screening mammogram will be mailed directly to the patient.

RECOMMENDATION:
Screening mammogram in one year. (Code:[id number]-[id number])

BI-RADS CATEGORY  1: Negative.

## 2020-03-06 DIAGNOSIS — M47817 Spondylosis without myelopathy or radiculopathy, lumbosacral region: Secondary | ICD-10-CM | POA: Diagnosis not present

## 2020-03-06 DIAGNOSIS — M545 Low back pain, unspecified: Secondary | ICD-10-CM | POA: Diagnosis not present

## 2020-03-06 DIAGNOSIS — M5137 Other intervertebral disc degeneration, lumbosacral region: Secondary | ICD-10-CM | POA: Diagnosis not present

## 2020-03-06 DIAGNOSIS — G894 Chronic pain syndrome: Secondary | ICD-10-CM | POA: Diagnosis not present

## 2020-03-12 ENCOUNTER — Telehealth: Payer: Self-pay | Admitting: *Deleted

## 2020-03-12 ENCOUNTER — Telehealth: Payer: Self-pay

## 2020-03-12 ENCOUNTER — Encounter: Payer: Self-pay | Admitting: Diagnostic Neuroimaging

## 2020-03-12 ENCOUNTER — Ambulatory Visit (INDEPENDENT_AMBULATORY_CARE_PROVIDER_SITE_OTHER): Payer: Medicare Other | Admitting: Diagnostic Neuroimaging

## 2020-03-12 VITALS — BP 104/60 | HR 88 | Ht 61.0 in | Wt 89.0 lb

## 2020-03-12 DIAGNOSIS — G35 Multiple sclerosis: Secondary | ICD-10-CM | POA: Diagnosis not present

## 2020-03-12 DIAGNOSIS — G8250 Quadriplegia, unspecified: Secondary | ICD-10-CM | POA: Diagnosis not present

## 2020-03-12 MED ORDER — DIMETHYL FUMARATE 240 MG PO CPDR: 240.0000 mg | DELAYED_RELEASE_CAPSULE | Freq: Two times a day (BID) | ORAL | Status: DC

## 2020-03-12 NOTE — Telephone Encounter (Signed)
Received form from [facility] [employer] re: Tysabri - patient status report and reauth questionnaire. Patient has follow up this afternoon.  Form placed on MD desk for completion, signature.

## 2020-03-12 NOTE — Patient Instructions (Signed)
MULTIPLE SCLEROSIS (RRMS; ? Secondary progression by symptoms) - stop tysabri; will de-escalate therapy due to age and progression of symptoms --> start tecfidera 4-6 weeks after last tysabri dose  - stop dalfampridine; not effective  - home health PT, OT, nursing     MUSCLE SPASMS (stable) - continue baclofen  - stop xeomin injections per Dr. [name] (not effective)

## 2020-03-12 NOTE — Telephone Encounter (Signed)
Received Tecfidera start form.  It has already been faxed to [facility].  Patient's last Tysabri infusion will be March 14, 2020.

## 2020-03-12 NOTE — Progress Notes (Signed)
[facility]  PATIENT: Amy Bray DOB: [birth year]  REFERRING CLINICIAN:  HISTORY FROM: patient REASON FOR VISIT: follow up   HISTORICAL  CHIEF COMPLAINT:  Chief Complaint  Patient presents with  . Multiple Sclerosis    Rm 7, 9 month FU "MS is getting worse fast; I fall over to the left; urinary incontinence at night; only eating one meal a day because it takes me so long to do anything; discuss oral medications"     HISTORY OF PRESENT ILLNESS:   UPDATE (03/12/20, VRP): Since last visit, symptoms are continuing to progress.  Her mobility and ADL functioning have declined.  It may take her up to 20 minutes to get out of bed.  It may take her 30 to 40 minutes to get dressed.  She is still living alone, but has medications and groceries delivered to her.  She is having more difficulty preparing meals and reaching up to the microwave.  She is having more problems with incontinence issues.  Her mother is living at an assisted living, husband at memory care center, and patient does not want to change her location yet.  She is looking at getting some assistance with aids and housekeepers.  She would like to be considered for a power wheelchair or power scooter.  UPDATE (05/30/19, VRP): Since last visit, doing poorly; husband having severe dementia issues causing significant stress. Patient is struggling with gait and weakness.    UPDATE (11/08/18, VRP): Since last visit, doing about the same. MS symptoms are stable. Severity moderate. No alleviating or aggravating factors. Tolerating meds. Now more caregiver stress, as her husband has progressive dementia.   UPDATE (12/07/17, VRP): Since last visit, now with swelling in feet x 2 months. Improved in last 1-2 weeks. No pain. No triggers. Had 1 day with some addl leg weakness (1 hour) then resolved on 11/29/17. Symptoms are now at baseline. Continues PT and water therapy. Tolerating tysabri.    UPDATE (08/10/17, VRP): Since  last visit, doing well. Tolerating tysabri and generic amprya. No alleviating or aggravating factors. Hot flashes improved. Fatigue improved.   UPDATE (02/11/17, VRP): Since last visit, doing well. Tolerating meds. No alleviating or aggravating factors. Now on xeomin (Aug and Dec 2018) and spasticity improved. On generic ampyra and doing well.   UPDATE 08/11/16: Since last visit, doing well. Tolerating tysabri and baclofen. Heat has been challenging with spasms. Gait diff with knees rubbing together, slightly worse.  UPDATE 02/11/16: Since last visit, balance diff continues. No flares. MRIs stable. Tolerating tysabri.   UPDATE 10/08/15: Since last visit, pt feels some progression of gait diff and walking problems. No specific attack or flare up.  UPDATE 07/04/15: Since last visit, doing well. Tolerating tysabri and ampyra.  UPDATE 04/02/15: Since last visit, doing well. Tolerating tysabri and ampyra. Back on nortriptyline (is helping burning pain in feet).   UPDATE 01/03/15: Since last visit, doing better on ampyra (better gait and walking speed). More foot/calf tingling burning pain x 1-2 months. Now with Dr. Andree Elk pain mgmt for back injections x 2, which is helping.  UPDATE 10/02/14: Since last visit, doing well. Requesting transition of care to [facility] for pain mgmt; previously at [facility] with Dr. Oliva Bustard.  UPDATE 05/22/14: Since last visit, doing well. On tysabri since March 2016. Fatigue and bladder control much improved. Gait is stable.   UPDATE 02/20/14: Since last visit, had reviewed lab results and now ready to start MS medication. Now would like to start tysabri.  UPDATE 01/09/14: Since last visit, no new events symptoms. LP results reviewed. Didn't get lab work at last visit for MS mimics.   UPDATE 12/07/13: Patient is stable with symptoms. MRI scans reviewed. Continues to have weakness in legs and balance difficulty.  UPDATE 11/21/13: 75 year old right-handed female here for evaluation of  lower extremity spasms and weakness. 2002 patient developed onset of right leg stiffness, spasm, weakness. Patient was diagnosed with cervical spine disease, bone spur ridging, and treated with anterior cervical discectomy and fusion in 2003. Following this her right leg weakness and spasms significantly improved. However one year later her symptoms return. Patient followed up with several surgeons and ultimately had a second cervical spine surgery in 2006. Following this patient had slight improvement of right leg however within several months her weakness, spasms and gait continued to decline. Patient transitioned needing a cane to walk. 2013 patient was evaluated by a neurologist in [place], to evaluate her right leg weakness. Apparently patient had a "multiple sclerosis" workup including MRI of the brain and extensive blood testing. Patient also had EMG nerve conduction studies looking for peroneal neuropathy but apparently all studies were normal. MRI of the brain did show a right medulla lesion which was reported as a stroke. Over the past 1 year patient has developed new onset left leg weakness, spasm, stiffness. Patient followed up with orthopedic surgery who then referred patient to me for evaluation. Last MRI of cervical spine was in 2006 at [facility]. No bowel or bladder incontinence. No significant numbness or tingling. No weakness in upper extremities. No vision loss, vision changes, slurred speech or trouble talking.   REVIEW OF SYSTEMS: Full 14 system review of systems performed and negative except: as per HPI   ALLERGIES: No Known Allergies  HOME MEDICATIONS: Outpatient Medications Prior to Visit  Medication Sig Dispense Refill  . acetaminophen (TYLENOL) 500 MG tablet Take 500 mg by mouth as needed.    . baclofen (LIORESAL) 10 MG tablet TAKE 2 TABLETS BY MOUTH THREE TIMES DAILY 180 tablet 5  . Cranberry-Vitamin C-Vitamin E 4200-20-3 MG-MG-UNIT CAPS Take by mouth.    . Dalfampridine  (4-AMINOPYRIDINE) POWD 5 mg by Does not apply route 3 (three) times daily. 5mg  capsules, one tid    . denosumab (PROLIA) 60 MG/ML SOLN injection Inject 60 mg into the skin every 6 (six) months. Administer in upper arm, thigh, or abdomen    . incobotulinumtoxinA (XEOMIN) 100 units SOLR injection Inject 500 Units into the muscle every 3 (three) months.    Marland Kitchen lisinopril (PRINIVIL,ZESTRIL) 10 MG tablet Take 1 tablet by mouth daily.    . natalizumab (TYSABRI) 300 MG/15ML injection Inject into the vein.    Marland Kitchen nortriptyline (PAMELOR) 50 MG capsule Take 1 capsule by mouth daily.    Marland Kitchen oxybutynin (DITROPAN-XL) 5 MG 24 hr tablet Take 5 mg by mouth daily.    . traMADol-acetaminophen (ULTRACET) 37.5-325 MG per tablet Take 1 tablet by mouth daily as needed.    Marland Kitchen UNABLE TO FIND Med Name: CBD oil at bedtime     Facility-Administered Medications Prior to Visit  Medication Dose Route Frequency Provider Last Rate Last Admin  . incobotulinumtoxinA (XEOMIN) 100 units injection 500 Units  500 Units Intramuscular Q90 days Marcial Pacas, MD   500 Units at 02/09/20 6195    PAST MEDICAL HISTORY: Past Medical History:  Diagnosis Date  . Cervical myelopathy ([place])   . Multiple sclerosis ([place])   . Osteoporosis 2018   hip  PAST SURGICAL HISTORY: Past Surgical History:  Procedure Laterality Date  . CERVICAL FUSION  01/2001, 01/2004   C3-C6    FAMILY HISTORY: Family History  Problem Relation Age of Onset  . Transient ischemic attack Mother   . Pancreatic cancer Father   . Breast cancer Cousin     SOCIAL HISTORY:  Social History   Socioeconomic History  . Marital status: Married    Spouse name: Avigail Pilling  . Number of children: 0  . Years of education: College  . Highest education level: Not on file  Occupational History    Employer: OTHER    Comment: n/a  Tobacco Use  . Smoking status: Former Smoker    Packs/day: 1.00    Years: 15.00    Pack years: 15.00    Types: Cigarettes    Quit date:  01/21/1983    Years since quitting: 37.1  . Smokeless tobacco: Never Used  Substance and Sexual Activity  . Alcohol use: Yes    Alcohol/week: 0.0 standard drinks    Comment: occasionally- wine  . Drug use: No  . Sexual activity: Not on file  Other Topics Concern  . Not on file  Social History Narrative   05/30/19 Patient lives at home alone,  Spouse in SNF for  dementia.   Caffeine Use-none   Social Determinants of Health   Financial Resource Strain: Not on file  Food Insecurity: Not on file  Transportation Needs: Not on file  Physical Activity: Not on file  Stress: Not on file  Social Connections: Not on file  Intimate Partner Violence: Not on file     PHYSICAL EXAM  GENERAL EXAM/CONSTITUTIONAL: Vitals:  Vitals:   03/12/20 1354  BP: 104/60  Pulse: 88  Weight: 89 lb (40.4 kg)  Height: 5\' 1"  (1.549 m)   Body mass index is 16.82 kg/m. Wt Readings from Last 3 Encounters:  03/12/20 89 lb (40.4 kg)  02/08/20 97 lb 12.8 oz (44.4 kg)  10/26/19 99 lb 8 oz (45.1 kg)    Patient is in no distress; well developed, nourished and groomed; neck is supple  CARDIOVASCULAR:  Examination of carotid arteries is normal; no carotid bruits  Regular rate and rhythm, no murmurs  Examination of peripheral vascular system by observation and palpation is normal  EYES:  Ophthalmoscopic exam of optic discs and posterior segments is normal; no papilledema or hemorrhages No exam data present  MUSCULOSKELETAL:  Gait, strength, tone, movements noted in Neurologic exam below  NEUROLOGIC: MENTAL STATUS:  No flowsheet data found.  awake, alert, oriented to person, place and time  recent and remote memory intact  normal attention and concentration  language fluent, comprehension intact, naming intact,   fund of knowledge appropriate  CRANIAL NERVE:   2nd - no papilledema on fundoscopic exam  2nd, 3rd, 4th, 6th - pupils equal and reactive to light, visual fields full to  confrontation, extraocular muscles intact, no nystagmus  5th - facial sensation symmetric  7th - facial strength symmetric  8th - hearing intact  9th - palate elevates symmetrically, uvula midline  11th - shoulder shrug symmetric  12th - tongue protrusion midline  MOTOR:   INCREASED EXT TONE IN BLE; BLE (HF 2, KE 3, KF 4 DF 3)  BUE 4+  SENSORY:   normal and symmetric to light touch, temperature; DECR AT TOES  COORDINATION:  finger-nose-finger, fine finger movements normal  REFLEXES:   deep tendon reflexes present and symmetric; EXCEPT BUE 3; BLE (HIP FLEXION 2;  KNEES 3; ANKLES 1-2)  GAIT/STATION:   IN WHEEL CHAIR; LEANING TO LEFT SIDE     DIAGNOSTIC DATA (LABS, IMAGING, TESTING) - I reviewed patient records, labs, notes, testing and imaging myself where available.  Lab Results  Component Value Date   WBC 5.7 12/14/2019   HGB 11.8 12/14/2019   HCT 36.5 12/14/2019   MCV 99 (H) 12/14/2019   PLT 305 12/14/2019      Component Value Date/Time   NA 141 05/30/2019 1638   K 4.1 05/30/2019 1638   CL 104 05/30/2019 1638   CO2 25 05/30/2019 1638   GLUCOSE 87 05/30/2019 1638   BUN 17 05/30/2019 1638   CREATININE 0.66 05/30/2019 1638   CALCIUM 9.2 05/30/2019 1638   PROT 6.4 05/30/2019 1638   ALBUMIN 4.7 05/30/2019 1638   AST 19 05/30/2019 1638   ALT 14 05/30/2019 1638   ALKPHOS 42 05/30/2019 1638   BILITOT 0.2 05/30/2019 1638   GFRNONAA 89 05/30/2019 1638   GFRAA 102 05/30/2019 1638   No results found for: CHOL, HDL, LDLCALC, LDLDIRECT, TRIG, CHOLHDL No results found for: HGBA1C No results found for: VITAMINB12 No results found for: TSH  Vit D, 25-Hydroxy  Date Value Ref Range Status  01/03/2015 36.8 30.0 - 100.0 ng/mL Final    Comment:    Vitamin D deficiency has been defined by the [place] practice guideline as a level of serum 25-OH vitamin D less than 20 ng/mL (1,2). The Endocrine Society went on to further  define vitamin D insufficiency as a level between 21 and 29 ng/mL (2). 1. IOM (Institute of Medicine). 2010. Dietary reference    intakes for calcium and D. [place]: The    [employer]. 2. Holick MF, Binkley [place], Bischoff-Ferrari HA, et al.    Evaluation, treatment, and prevention of vitamin D    deficiency: an Endocrine Society clinical practice    guideline. JCEM. 2011 Jul; 96(7):1911-30.     I reviewed images myself. In my review, I think there is a 2cm spinal cord lesion (right side, C6-C7) and a smaller left T1 lesion, suspicious for demyelinating disease, on the MRI cervical spine from 10/08/03. No adjacent disc or bone degeneration or spinal stenosis. The MRI report states that the cord appears normal. -VRP  10/08/03 MRI cervical spine (images reviewed) - Satisfactory anterior plate fusion of C4 and C5.Disk degeneration and spondylosis at C3-4 and C5-6 with biforaminal narrowing but no significant central canal stenosis. There is no disk protrusion.   12/29/13 LP - WBC 1, RBC 0, protein 26, glucose 59, OCB > 5, gram stain neg  04/26/17 MRI of the brain with and without contrast [I reviewed images myself and agree with interpretation. 1 small new lesion. -VRP]  1.   Small left occipital subcortical T2/FLAIR hyperintense focus with subtle hyperintensity on diffusion-weighted images.  This focus was not present on the 08/24/2016 MRI.  The etiology is uncertain but it could represent a sub-chronic focus of chronic ischemic or demyelinating change.   2.   There are a couple T2/FLAIR hyperintense foci in the subcortical and deep white matter.  This is a nonspecific finding and could be incidental at this age, will be due to minimal chronic microvascular ischemic change or demyelination.  None of the foci appears to be acute and there is no change when compared to the 08/24/2016 MRI. 3.   Mild generalized cortical atrophy, typical for age. 4.   There is a  normal enhancement  pattern.  04/26/17 MRI of the cervical spine with and without contrast [I reviewed images myself and agree with interpretation. Stable since 2017.  -VRP]  1.   There are 3 small T2/FLAIR hyperintense foci within the spinal cord adjacent to C6, C7 and T2.  All of these were present on the 10/19/2015 MRI.  None of these enhance. 2.   Multilevel degenerative changes as detailed above.   The degenerative changes at C6-C7 have slightly progressed when compared to the previous MRI.  There did not appear to be any nerve root compression. 3.   ACDF from C3 through C6.   4.   There is a normal enhancement pattern of there are no acute findings.  04/26/17 Unremarkable MRI thoracic spine (with and without). [I reviewed images myself and agree with interpretation. -VRP]   05/19/18 MRI cervical spine (with and without) demonstrating: - Small chronic plaques at C6 on the left and C7 on the right; no acute plaques - No new plaques from MRI on 04/26/2017; prior T2 level plaque is not well visualized on current study  05/19/18 MRI thoracic spine (with and without) demonstrating:  - subtle T2 hyperintensity at C7 level on sagittal views; consistent with chronic plaque - no acute plaques; remainder of spinal cord is also unremarkable  12/17/19 MRI brain 1. In comparison to MRI from November 28, 2018, there are a few new small juxtacortical and periventricular T2/FLAIR hyperintensities in bifrontal white matter, which are nonspecific but could relate to demyelination in this patient with a reported history of multiple sclerosis. 2. No abnormal enhancement or restricted diffusion to suggest active demyelination. 3. Small remote left occipital lobe infarct.  12/17/19 MRI cervical spine 1. Possible focal T2 hyperintensity in the lateral left cord at C5 versus artifact related to adjacent fusion, similar to prior. No convincing new cord signal abnormality. No cord enhancement to suggest active demyelination. 2. C3-C6  ACDF. Similar adjacent level anterolisthesis of C6 on C7 with uncovering the disc and mild canal stenosis. Limited evaluation of the foramina at this level given motion. Suspected mild to moderate foraminal stenosis at C5-C6.   Anti-JCV antibody 01/10/14 - 0.10 negative  05/22/14 - 0.09 negative 12/2014 - negative March 2017 - negative 10/09/15 - 0.13 negative 02/11/16 JCV antibody 0.11 negative 08-13-16 JCV antibody 0.11 negative sy 03/05/17 JCV negative 0.07 08-15-17 JCV negative, index 0.08    ASSESSMENT AND PLAN  75 y.o. year old female here with  bilateral lower extremity spasticity, weakness,with brisk reflexes in upper and lower extremities, left Hoffman sign, bilateral upgoing toes. MRIs from 2005 and 2015 demonstrate multiple spinal cord lesions. Now suspect demyelinating disease, with first attack in 2002, second attack in 2003, and 3rd attack in 2014.  Then with some progression of symptoms since mid-2017.  Some worsening balance since Jan 2019.    Dx: multiple sclerosis   Relapsing remitting multiple sclerosis ([place]) - Plan: Home Health, Face-to-face encounter (required for Medicare/Medicaid patients)  Quadriplegia, unspecified ([place]) - Plan: Home Health, Face-to-face encounter (required for Medicare/Medicaid patients)    PLAN:  MULTIPLE SCLEROSIS (RRMS; secondary progression by symptoms) - stop tysabri; will de-escalate therapy due to advanced age and progression of symptoms --> start tecfidera (or generic) 4-6 weeks after last tysabri dose - stop dalfampridine; no benefit and significant progression of symptoms - home health PT, OT, nursing, SW eval (home safety, mobility and powerchair eval)   INCONTINENCE - schedule bathroom breaks; continue fiber supplement  MUSCLE SPASMS (stable) - continue  baclofen - stop chemodenervation (per Dr. [name])  Orders Placed This Encounter  Procedures  . Home Health  . Face-to-face encounter (required for Medicare/Medicaid  patients)   Return in about 4 months (around 07/10/2020) for virtual visit (30 min).  I spent 45 minutes of face-to-face and non-face-to-face time with patient.  This included previsit chart review, lab review, study review, order entry, electronic health record documentation, patient education.      Penni Bombard, MD 04/12/4008, 2:72 PM Certified in Neurology, Neurophysiology and Neuroimaging  [facility] [address] [place], [place] [address] [phone]

## 2020-03-12 NOTE — Progress Notes (Signed)
Tecfidera (dimethyl fumarate) start form signed by patient, on MD desk for completion, signature. Tecfidera start form and insurance information faxed to [place]. Her last Tysabri infusion will be 03/14/20. Start form given to [employer], [place] coordinator.

## 2020-03-12 NOTE — Telephone Encounter (Signed)
Tecfidera start form completed, signed and faxed to [place]. Copy given to Ginnie Smart, [place] coordinator.

## 2020-03-13 NOTE — Telephone Encounter (Addendum)
Received fax from [place]: patient is new to their pharmacy, need further information. Called patient, LVM advising I'll call her pharmacy for information I need. Amy Bray, spoke with pharmacist. She has [facility] Medicare part D. ID [id number], BIN [id number] PCN [id number] Group  [id number]. [facility] form filled out, placed on MD desk for signature.

## 2020-03-13 NOTE — Telephone Encounter (Signed)
Received fax from [place] referral received, will get her on therapy as quickly as possible. Call for questions: [phone].

## 2020-03-13 NOTE — Telephone Encounter (Signed)
Form signed and faxed to [place].

## 2020-03-14 DIAGNOSIS — G35 Multiple sclerosis: Secondary | ICD-10-CM | POA: Diagnosis not present

## 2020-03-15 ENCOUNTER — Telehealth: Payer: Self-pay | Admitting: *Deleted

## 2020-03-15 ENCOUNTER — Other Ambulatory Visit: Payer: Self-pay | Admitting: Diagnostic Neuroimaging

## 2020-03-15 NOTE — Telephone Encounter (Signed)
Called [facility] and spoke with [place], informed her that the patient received her last Tysabri infusion yesterday. Dr Leta Baptist is switching treatment to Tecfidera. I advised her the Tysabri reauth form is on MD desk to complete, sign. She advised to complete it and check 'no authorizatoin to continue therapy' box. They will process form as discontinuation of therapy when they receive it.

## 2020-03-15 NOTE — Telephone Encounter (Signed)
Tysabri patient status, reauth questionaire completed, signed and faxd to [facility].  Copy given to [facility]

## 2020-03-19 NOTE — Telephone Encounter (Signed)
PA for dimethyl fumarate 240 mg capsules initiated on CMM: Key: [id number].  Sent to [facility].  Should have a determination within 3 days.

## 2020-03-19 NOTE — Telephone Encounter (Signed)
Received this notice from [place] at [place]: "Since subs allowed, the patient's prescription was sent to [facility] [phone]) & the referral was closed.  We won't be able to see updates on this patient."

## 2020-03-19 NOTE — Telephone Encounter (Signed)
Received fax from [place] re: notice of discontinuation of therapy, patient not authorized to receive Tysabri. Copy given to [facility] RN, infusion suite.

## 2020-03-19 NOTE — Telephone Encounter (Signed)
PA for dimethyl fumarate starter pack was approved by [facility]. [employer] [place] - PA Case ID: [id number]. "PA Case: [id number], Status: Approved, Coverage Starts on: 01/21/2020 12:00:00 AM, Coverage Ends on: 01/19/2021 12:00:00 AM. Questions? Contact [phone]."

## 2020-03-19 NOTE — Telephone Encounter (Signed)
I called [facility] specialty pharmacy.  They have been unsuccessful at reaching patient to discuss her updated insurance information.  Per a phone note with Verneita Griffes, RN it appears that patient's insurance is [employer] part D.  I gave [facility] specialty this information.  Dr. Leta Baptist indicated on the start form that generic was acceptable.  Dimethyl fumarate does need a prior authorization.  I will work on this.

## 2020-03-20 NOTE — Telephone Encounter (Signed)
Received notice from email Medicare that a 30-day supply dimethyl fumarate was approved: "[employer] has approved a 30 day supply for the drug listed above but denied your request for a 90 day supply. The drug you requested is a specialty drug. Your Prescription Drug Guide says that specialty drugs are limited to a 30-day supply. You can view your PDG online at [employer].[email or web address]. A full list of pharmacies is also available at [place] at [email or web address] or you may call customer care at [phone] (TTY: 711), 8 a.m. 8 p.m. EST, seven days a week for a full list of in-network pharmacies. If receiving your drugs via mail-order delivery is more convenient for you, you can visit our list of in-network mail-order pharmacies at [email or web address] (30-day supply limit on some specialty drugs also applies to mail-order). [employer] has approved coverage for the drug listed above, up to a 30-day supply per fill, under your Part D benefit for/through 01/19/2021."

## 2020-03-20 NOTE — Telephone Encounter (Signed)
I called [facility].  I advised them that dimethyl fumarate was approved for 30-day supply.  They ran a test claim and it did get through.  They will contact the patient to set up shipment.

## 2020-03-26 ENCOUNTER — Telehealth: Payer: Self-pay | Admitting: Diagnostic Neuroimaging

## 2020-03-26 NOTE — Telephone Encounter (Signed)
Pt called wanting to speak to the RN regarding her MS medication. Please advise.

## 2020-03-26 NOTE — Telephone Encounter (Signed)
I called patient to discuss if she has received her dimethyl fumarate.  No answer, left a message asking her to call me back.

## 2020-03-26 NOTE — Telephone Encounter (Signed)
I called patient.  She reports that [place] called her yesterday and advised her that her prior authorization was good for 30-day supply of dimethyl fumarate.  They were waiting on our office to give him clearance to schedule shipment date.  I called [facility] specialty pharmacy.  Patient's co-pay will be over $200.  They are trying to reach patient to set up assistance but patient must call them back at [phone].  They have been unable to reach the patient to discuss this.  I called patient again to discuss.  No answer, left a voicemail asking her to call us back.

## 2020-03-26 NOTE — Telephone Encounter (Signed)
Duplicate phone note. See telephone note from 03/26/20.

## 2020-03-27 NOTE — Telephone Encounter (Signed)
I called patient.  She will call [facility] to discuss her co-pay and assistant options.  She will let me know if the co-pay remains unaffordable.

## 2020-04-02 NOTE — Telephone Encounter (Signed)
I called patient.  She has called [place] they have not returned her call.  She will call them again today.  I will continue to follow.

## 2020-04-02 NOTE — Telephone Encounter (Signed)
This is a duplicate phone note.  Please use phone note titled "Tecfidera start form" for further phone call documentation regarding pt's Tecfidera.

## 2020-04-02 NOTE — Telephone Encounter (Signed)
I called patient.  No answer, left a voicemail asking her to call me back.

## 2020-04-02 NOTE — Telephone Encounter (Signed)
Pt. returned RN's call. I informed her of where I seen that she was called last Mon. 03/26/20. She states if RN did not call her this morning that she does not need to call her back.

## 2020-04-09 NOTE — Telephone Encounter (Signed)
I called patient to discuss.  No answer, left a voicemail asking her to call me back.

## 2020-04-10 NOTE — Telephone Encounter (Signed)
I called patient again to discuss.  No answer, left a voicemail asking her to call me back.

## 2020-04-12 ENCOUNTER — Telehealth: Payer: Self-pay | Admitting: *Deleted

## 2020-04-12 NOTE — Telephone Encounter (Signed)
Tecfidera PA, key: [id number]. Received : There is an EOC that was clinically denied within the last 60 days. This request needs to be sent to the Grievance and [employer]. at [facility]. Appeal requests must come from the member or the provider. Called [employer], spoke with [place] who stated she sees a note that patient never started tecfidera and note from case manager stating a welcome call needs to be made. If med was denied they need denial letter  Fax [phone].  [facility], spoke with Almyra Free who ran test claim, stated there is approval on file for dimethyl fumarate from   01/21/2020-01/19/2021, Josem Kaufmann [id number]. She stated a PA was never started for brand Tecfidera. She will fax approval letter. Received fax from [facility] which states dimethyl fumarate is denied for 90 day supply; sent 30 day supply. It is approved for 30 day supply per plan's specialty drug guide.  Called patient, requested call back to discuss medication.

## 2020-04-16 NOTE — Telephone Encounter (Signed)
I called patient again to discuss.  No answer, left a voicemail asking her to call us back.

## 2020-04-17 ENCOUNTER — Encounter: Payer: Self-pay | Admitting: *Deleted

## 2020-04-17 NOTE — Telephone Encounter (Signed)
Pt called and the mychart  message from 3:08 was relayed to pt.  Pt is asking for a call back from Leilani Able, RN

## 2020-04-17 NOTE — Telephone Encounter (Signed)
Called patient who stated she called [employer] today. They need a 1040 tax form sent to them. Her co pay is $200. I advised her that [facility] stated dimethyl fumarate was approved. I advised she try to find her 1040 form. I will look into it further and get back with her.  Called [facility] where tecfidera Rx was sent, after long wait on hold was told "we can't take your call right now, call back later".  Sent e mail to [employer] with [place] asking for help in this matter.

## 2020-04-17 NOTE — Telephone Encounter (Signed)
LVM #2 advising I am calling to find out if she has received Tecfidera or generic, dimethyl fumarate and started taking. Advised am sending my chart as well.

## 2020-04-19 ENCOUNTER — Telehealth: Payer: Self-pay | Admitting: Diagnostic Neuroimaging

## 2020-04-19 DIAGNOSIS — G8929 Other chronic pain: Secondary | ICD-10-CM | POA: Diagnosis not present

## 2020-04-19 DIAGNOSIS — M810 Age-related osteoporosis without current pathological fracture: Secondary | ICD-10-CM | POA: Diagnosis not present

## 2020-04-19 DIAGNOSIS — I10 Essential (primary) hypertension: Secondary | ICD-10-CM | POA: Diagnosis not present

## 2020-04-19 NOTE — Telephone Encounter (Signed)
LVM requesting call back.

## 2020-04-19 NOTE — Telephone Encounter (Signed)
Please refer to phone note dated 04/12/20

## 2020-04-19 NOTE — Telephone Encounter (Signed)
Pt returned phone call. Would like a call back. 

## 2020-04-19 NOTE — Telephone Encounter (Signed)
Pt called wanting to speak to the RN regarding some information about her medication. Please advise.

## 2020-04-19 NOTE — Telephone Encounter (Signed)
Called patient who stated she talked to drug company last night. She does not recall name of pharmacy. They are mailing generic dimethyl fumarate to arrive Sat, no co pay. She will start taking it Monday. She will call and let me know which pharmacy.  Patient verbalized understanding, appreciation.

## 2020-04-23 NOTE — Telephone Encounter (Signed)
Per previous phone note, patient she has received her dimethyl fumarate on April 21, 2020.  I will check on her next week to make sure she is tolerating it well.

## 2020-05-09 ENCOUNTER — Ambulatory Visit: Payer: Medicare Other | Admitting: Neurology

## 2020-05-09 DIAGNOSIS — G8929 Other chronic pain: Secondary | ICD-10-CM | POA: Diagnosis not present

## 2020-05-09 DIAGNOSIS — I10 Essential (primary) hypertension: Secondary | ICD-10-CM | POA: Diagnosis not present

## 2020-05-09 DIAGNOSIS — M810 Age-related osteoporosis without current pathological fracture: Secondary | ICD-10-CM | POA: Diagnosis not present

## 2020-06-06 DIAGNOSIS — M5137 Other intervertebral disc degeneration, lumbosacral region: Secondary | ICD-10-CM | POA: Diagnosis not present

## 2020-06-06 DIAGNOSIS — Z79899 Other long term (current) drug therapy: Secondary | ICD-10-CM | POA: Diagnosis not present

## 2020-06-06 DIAGNOSIS — M533 Sacrococcygeal disorders, not elsewhere classified: Secondary | ICD-10-CM | POA: Diagnosis not present

## 2020-06-06 DIAGNOSIS — Z79891 Long term (current) use of opiate analgesic: Secondary | ICD-10-CM | POA: Diagnosis not present

## 2020-06-06 DIAGNOSIS — G894 Chronic pain syndrome: Secondary | ICD-10-CM | POA: Diagnosis not present

## 2020-06-06 DIAGNOSIS — M47817 Spondylosis without myelopathy or radiculopathy, lumbosacral region: Secondary | ICD-10-CM | POA: Diagnosis not present

## 2020-06-25 DIAGNOSIS — M810 Age-related osteoporosis without current pathological fracture: Secondary | ICD-10-CM | POA: Diagnosis not present

## 2020-06-25 DIAGNOSIS — E782 Mixed hyperlipidemia: Secondary | ICD-10-CM | POA: Diagnosis not present

## 2020-06-25 DIAGNOSIS — G8929 Other chronic pain: Secondary | ICD-10-CM | POA: Diagnosis not present

## 2020-06-25 DIAGNOSIS — I10 Essential (primary) hypertension: Secondary | ICD-10-CM | POA: Diagnosis not present

## 2020-06-27 DIAGNOSIS — Z0000 Encounter for general adult medical examination without abnormal findings: Secondary | ICD-10-CM | POA: Diagnosis not present

## 2020-06-27 DIAGNOSIS — Z1389 Encounter for screening for other disorder: Secondary | ICD-10-CM | POA: Diagnosis not present

## 2020-06-28 DIAGNOSIS — I10 Essential (primary) hypertension: Secondary | ICD-10-CM | POA: Diagnosis not present

## 2020-06-28 DIAGNOSIS — E782 Mixed hyperlipidemia: Secondary | ICD-10-CM | POA: Diagnosis not present

## 2020-06-28 DIAGNOSIS — G35 Multiple sclerosis: Secondary | ICD-10-CM | POA: Diagnosis not present

## 2020-06-28 DIAGNOSIS — M21372 Foot drop, left foot: Secondary | ICD-10-CM | POA: Diagnosis not present

## 2020-06-28 DIAGNOSIS — M810 Age-related osteoporosis without current pathological fracture: Secondary | ICD-10-CM | POA: Diagnosis not present

## 2020-06-28 DIAGNOSIS — D649 Anemia, unspecified: Secondary | ICD-10-CM | POA: Diagnosis not present

## 2020-07-11 ENCOUNTER — Encounter: Payer: Self-pay | Admitting: Diagnostic Neuroimaging

## 2020-07-11 ENCOUNTER — Telehealth (INDEPENDENT_AMBULATORY_CARE_PROVIDER_SITE_OTHER): Payer: Medicare Other | Admitting: Diagnostic Neuroimaging

## 2020-07-11 DIAGNOSIS — R269 Unspecified abnormalities of gait and mobility: Secondary | ICD-10-CM

## 2020-07-11 DIAGNOSIS — G35 Multiple sclerosis: Secondary | ICD-10-CM

## 2020-07-11 DIAGNOSIS — G8250 Quadriplegia, unspecified: Secondary | ICD-10-CM | POA: Diagnosis not present

## 2020-07-11 NOTE — Progress Notes (Signed)
[facility]  PATIENT: Amy Bray DOB: [birth year]  REFERRING CLINICIAN:  HISTORY FROM: patient REASON FOR VISIT: follow up   HISTORICAL  CHIEF COMPLAINT:  Chief Complaint  Patient presents with   Multiple Sclerosis    Virtual Visit via Telephone Note  I connected withNAME@ on 07/11/20 at  2:30 PM EDT by telephone and verified that I am speaking with the correct person using two identifiers.   I discussed the limitations, risks, security and privacy concerns of performing an evaluation and management service by telephone and the availability of in person appointments. I also discussed with the patient that there may be a patient responsible charge related to this service. The patient expressed understanding and agreed to proceed. Patient is at home and I am at the office.  UPDATE (07/11/20, VRP): Since last visit, doing well! Now on tecfidera generic. More energy and balance. Also improved weigfht gain. Less stress.   UPDATE (03/12/20, VRP): Since last visit, symptoms are continuing to progress.  Her mobility and ADL functioning have declined.  It may take her up to 20 minutes to get out of bed.  It may take her 30 to 40 minutes to get dressed.  She is still living alone, but has medications and groceries delivered to her.  She is having more difficulty preparing meals and reaching up to the microwave.  She is having more problems with incontinence issues.  Her mother is living at an assisted living, husband at memory care center, and patient does not want to change her location yet.  She is looking at getting some assistance with aids and housekeepers.  She would like to be considered for a power wheelchair or power scooter.  UPDATE (05/30/19, VRP): Since last visit, doing poorly; husband having severe dementia issues causing significant stress. Patient is struggling with gait and weakness.    UPDATE (11/08/18, VRP): Since last visit, doing about the same. MS  symptoms are stable. Severity moderate. No alleviating or aggravating factors. Tolerating meds. Now more caregiver stress, as her husband has progressive dementia.   UPDATE (12/07/17, VRP): Since last visit, now with swelling in feet x 2 months. Improved in last 1-2 weeks. No pain. No triggers. Had 1 day with some addl leg weakness (1 hour) then resolved on 11/29/17. Symptoms are now at baseline. Continues PT and water therapy. Tolerating tysabri.    UPDATE (08/10/17, VRP): Since last visit, doing well. Tolerating tysabri and generic amprya. No alleviating or aggravating factors. Hot flashes improved. Fatigue improved.   UPDATE (02/11/17, VRP): Since last visit, doing well. Tolerating meds. No alleviating or aggravating factors. Now on xeomin (Aug and Dec 2018) and spasticity improved. On generic ampyra and doing well.   UPDATE 08/11/16: Since last visit, doing well. Tolerating tysabri and baclofen. Heat has been challenging with spasms. Gait diff with knees rubbing together, slightly worse.  UPDATE 02/11/16: Since last visit, balance diff continues. No flares. MRIs stable. Tolerating tysabri.   UPDATE 10/08/15: Since last visit, pt feels some progression of gait diff and walking problems. No specific attack or flare up.  UPDATE 07/04/15: Since last visit, doing well. Tolerating tysabri and ampyra.  UPDATE 04/02/15: Since last visit, doing well. Tolerating tysabri and ampyra. Back on nortriptyline (is helping burning pain in feet).   UPDATE 01/03/15: Since last visit, doing better on ampyra (better gait and walking speed). More foot/calf tingling burning pain x 1-2 months. Now with Dr. Andree Elk pain mgmt for back injections x 2, which is  helping.  UPDATE 10/02/14: Since last visit, doing well. Requesting transition of care to [facility] for pain mgmt; previously at [facility] with Dr. Oliva Bustard.  UPDATE 05/22/14: Since last visit, doing well. On tysabri since March 2016. Fatigue and bladder control much improved. Gait  is stable.   UPDATE 02/20/14: Since last visit, had reviewed lab results and now ready to start MS medication. Now would like to start tysabri.  UPDATE 01/09/14: Since last visit, no new events symptoms. LP results reviewed. Didn't get lab work at last visit for MS mimics.   UPDATE 12/07/13: Patient is stable with symptoms. MRI scans reviewed. Continues to have weakness in legs and balance difficulty.  UPDATE 11/21/13: 75 year old right-handed female here for evaluation of lower extremity spasms and weakness. 2002 patient developed onset of right leg stiffness, spasm, weakness. Patient was diagnosed with cervical spine disease, bone spur ridging, and treated with anterior cervical discectomy and fusion in 2003. Following this her right leg weakness and spasms significantly improved. However one year later her symptoms return. Patient followed up with several surgeons and ultimately had a second cervical spine surgery in 2006. Following this patient had slight improvement of right leg however within several months her weakness, spasms and gait continued to decline. Patient transitioned needing a cane to walk. 2013 patient was evaluated by a neurologist in [place], to evaluate her right leg weakness. Apparently patient had a "multiple sclerosis" workup including MRI of the brain and extensive blood testing. Patient also had EMG nerve conduction studies looking for peroneal neuropathy but apparently all studies were normal. MRI of the brain did show a right medulla lesion which was reported as a stroke. Over the past 1 year patient has developed new onset left leg weakness, spasm, stiffness. Patient followed up with orthopedic surgery who then referred patient to me for evaluation. Last MRI of cervical spine was in 2006 at [facility]. No bowel or bladder incontinence. No significant numbness or tingling. No weakness in upper extremities. No vision loss, vision changes, slurred speech or trouble  talking.   REVIEW OF SYSTEMS: Full 14 system review of systems performed and negative except: as per HPI   ALLERGIES: No Known Allergies  HOME MEDICATIONS: Outpatient Medications Prior to Visit  Medication Sig Dispense Refill   acetaminophen (TYLENOL) 500 MG tablet Take 500 mg by mouth as needed.     baclofen (LIORESAL) 10 MG tablet TAKE TWO TABLETS BY MOUTH THREE TIMES DAILY AS NEEDED 180 tablet 5   Cranberry-Vitamin C-Vitamin E 4200-20-3 MG-MG-UNIT CAPS Take by mouth.     denosumab (PROLIA) 60 MG/ML SOLN injection Inject 60 mg into the skin every 6 (six) months. Administer in upper arm, thigh, or abdomen     Dimethyl Fumarate 240 MG CPDR Take 1 capsule (240 mg total) by mouth 2 (two) times daily. 60 capsule    lisinopril (PRINIVIL,ZESTRIL) 10 MG tablet Take 1 tablet by mouth daily.     nortriptyline (PAMELOR) 50 MG capsule Take 1 capsule by mouth daily.     oxybutynin (DITROPAN-XL) 5 MG 24 hr tablet Take 5 mg by mouth daily.     traMADol-acetaminophen (ULTRACET) 37.5-325 MG per tablet Take 1 tablet by mouth daily as needed.     UNABLE TO FIND Med Name: CBD oil at bedtime     No facility-administered medications prior to visit.    PAST MEDICAL HISTORY: Past Medical History:  Diagnosis Date   Cervical myelopathy ([place])    Multiple sclerosis ([place])    Osteoporosis  2018   hip    PAST SURGICAL HISTORY: Past Surgical History:  Procedure Laterality Date   CERVICAL FUSION  01/2001, 01/2004   C3-C6    FAMILY HISTORY: Family History  Problem Relation Age of Onset   Transient ischemic attack Mother    Pancreatic cancer Father    Breast cancer Cousin     SOCIAL HISTORY:  Social History   Socioeconomic History   Marital status: Married    Spouse name: Ceri Mayer   Number of children: 0   Years of education: College   Highest education level: Not on file  Occupational History    Employer: OTHER    Comment: n/a  Tobacco Use   Smoking status: Former    Packs/day: 1.00     Years: 15.00    Pack years: 15.00    Types: Cigarettes    Quit date: 01/21/1983    Years since quitting: 37.4   Smokeless tobacco: Never  Substance and Sexual Activity   Alcohol use: Yes    Alcohol/week: 0.0 standard drinks    Comment: occasionally- wine   Drug use: No   Sexual activity: Not on file  Other Topics Concern   Not on file  Social History Narrative   05/30/19 Patient lives at home alone,  Spouse in SNF for  dementia.   Caffeine Use-none   Social Determinants of Health   Financial Resource Strain: Not on file  Food Insecurity: Not on file  Transportation Needs: Not on file  Physical Activity: Not on file  Stress: Not on file  Social Connections: Not on file  Intimate Partner Violence: Not on file     PHYSICAL EXAM  Telephone visit     DIAGNOSTIC DATA (LABS, IMAGING, TESTING) - I reviewed patient records, labs, notes, testing and imaging myself where available.  Lab Results  Component Value Date   WBC 5.7 12/14/2019   HGB 11.8 12/14/2019   HCT 36.5 12/14/2019   MCV 99 (H) 12/14/2019   PLT 305 12/14/2019      Component Value Date/Time   NA 141 05/30/2019 1638   K 4.1 05/30/2019 1638   CL 104 05/30/2019 1638   CO2 25 05/30/2019 1638   GLUCOSE 87 05/30/2019 1638   BUN 17 05/30/2019 1638   CREATININE 0.66 05/30/2019 1638   CALCIUM 9.2 05/30/2019 1638   PROT 6.4 05/30/2019 1638   ALBUMIN 4.7 05/30/2019 1638   AST 19 05/30/2019 1638   ALT 14 05/30/2019 1638   ALKPHOS 42 05/30/2019 1638   BILITOT 0.2 05/30/2019 1638   GFRNONAA 89 05/30/2019 1638   GFRAA 102 05/30/2019 1638   No results found for: CHOL, HDL, LDLCALC, LDLDIRECT, TRIG, CHOLHDL No results found for: HGBA1C No results found for: VITAMINB12 No results found for: TSH  Vit D, 25-Hydroxy  Date Value Ref Range Status  01/03/2015 36.8 30.0 - 100.0 ng/mL Final    Comment:    Vitamin D deficiency has been defined by the [facility] practice  guideline as a level of serum 25-OH vitamin D less than 20 ng/mL (1,2). The Endocrine Society went on to further define vitamin D insufficiency as a level between 21 and 29 ng/mL (2). 1. IOM (Institute of Medicine). 2010. Dietary reference    intakes for calcium and D. [place]: The    [employer]. 2. Holick MF, Binkley [place], Bischoff-Ferrari HA, et al.    Evaluation, treatment, and prevention of vitamin D    deficiency:  an Endocrine Society clinical practice    guideline. JCEM. 2011 Jul; 96(7):1911-30.     I reviewed images myself. In my review, I think there is a 2cm spinal cord lesion (right side, C6-C7) and a smaller left T1 lesion, suspicious for demyelinating disease, on the MRI cervical spine from 10/08/03. No adjacent disc or bone degeneration or spinal stenosis. The MRI report states that the cord appears normal. -VRP  10/08/03 MRI cervical spine (images reviewed) - Satisfactory anterior plate fusion of C4 and C5. Disk degeneration and spondylosis at C3-4 and C5-6 with biforaminal narrowing but no significant central canal stenosis. There is no disk protrusion.    12/29/13 LP - WBC 1, RBC 0, protein 26, glucose 59, OCB > 5, gram stain neg  04/26/17 MRI of the brain with and without contrast [I reviewed images myself and agree with interpretation. 1 small new lesion. -VRP]  1.   Small left occipital subcortical T2/FLAIR hyperintense focus with subtle hyperintensity on diffusion-weighted images.  This focus was not present on the 08/24/2016 MRI.  The etiology is uncertain but it could represent a sub-chronic focus of chronic ischemic or demyelinating change.   2.   There are a couple T2/FLAIR hyperintense foci in the subcortical and deep white matter.  This is a nonspecific finding and could be incidental at this age, will be due to minimal chronic microvascular ischemic change or demyelination.  None of the foci appears to be acute and there is no change when compared to the  08/24/2016 MRI. 3.   Mild generalized cortical atrophy, typical for age. 4.   There is a normal enhancement pattern.  04/26/17 MRI of the cervical spine with and without contrast [I reviewed images myself and agree with interpretation. Stable since 2017.  -VRP]  1.   There are 3 small T2/FLAIR hyperintense foci within the spinal cord adjacent to C6, C7 and T2.  All of these were present on the 10/19/2015 MRI.  None of these enhance. 2.   Multilevel degenerative changes as detailed above.   The degenerative changes at C6-C7 have slightly progressed when compared to the previous MRI.  There did not appear to be any nerve root compression. 3.   ACDF from C3 through C6.   4.   There is a normal enhancement pattern of there are no acute findings.  04/26/17 Unremarkable MRI thoracic spine (with and without). [I reviewed images myself and agree with interpretation. -VRP]   05/19/18 MRI cervical spine (with and without) demonstrating: - Small chronic plaques at C6 on the left and C7 on the right; no acute plaques - No new plaques from MRI on 04/26/2017; prior T2 level plaque is not well visualized on current study  05/19/18 MRI thoracic spine (with and without) demonstrating:  - subtle T2 hyperintensity at C7 level on sagittal views; consistent with chronic plaque - no acute plaques; remainder of spinal cord is also unremarkable  12/17/19 MRI brain 1. In comparison to MRI from November 28, 2018, there are a few new small juxtacortical and periventricular T2/FLAIR hyperintensities in bifrontal white matter, which are nonspecific but could relate to demyelination in this patient with a reported history of multiple sclerosis. 2. No abnormal enhancement or restricted diffusion to suggest active demyelination. 3. Small remote left occipital lobe infarct.  12/17/19 MRI cervical spine 1. Possible focal T2 hyperintensity in the lateral left cord at C5 versus artifact related to adjacent fusion, similar to prior.  No convincing new cord signal abnormality. No cord  enhancement to suggest active demyelination. 2. C3-C6 ACDF. Similar adjacent level anterolisthesis of C6 on C7 with uncovering the disc and mild canal stenosis. Limited evaluation of the foramina at this level given motion. Suspected mild to moderate foraminal stenosis at C5-C6.   Anti-JCV antibody 01/10/14 - 0.10 negative  05/22/14 - 0.09 negative 12/2014 - negative March 2017 - negative 10/09/15 - 0.13 negative 02/11/16 JCV antibody 0.11 negative 08-13-16 JCV antibody 0.11 negative sy 03/05/17 JCV negative 0.07 08-15-17 JCV negative, index 0.08    ASSESSMENT AND PLAN  75 y.o. year old female here with  bilateral lower extremity spasticity, weakness,with brisk reflexes in upper and lower extremities, left Hoffman sign, bilateral upgoing toes. MRIs from 2005 and 2015 demonstrate multiple spinal cord lesions. Now suspect demyelinating disease, with first attack in 2002, second attack in 2003, and 3rd attack in 2014.  Then with some progression of symptoms since mid-2017.  Some worsening balance since Jan 2019.    Dx: multiple sclerosis   Relapsing remitting multiple sclerosis ([place]) - Plan: CBC with Differential/Platelet, Comprehensive metabolic panel  Quadriplegia, unspecified ([place]) - Plan: CBC with Differential/Platelet, Comprehensive metabolic panel  Gait disturbance     PLAN:  MULTIPLE SCLEROSIS (RRMS; secondary progression by symptoms) - continue dimethyl fumarate; check labs - home health PT, OT, nursing, SW eval (home safety, mobility and powerchair eval)   INCONTINENCE - schedule bathroom breaks; continue fiber supplement  MUSCLE SPASMS (stable) - continue baclofen - stop chemodenervation (per Dr. [name])  Orders Placed This Encounter  Procedures   CBC with Differential/Platelet   Comprehensive metabolic panel   Return in about 9 months (around 04/10/2021).  I spent 15 minutes of face-to-face and  non-face-to-face time with patient.  This included previsit chart review, lab review, study review, order entry, electronic health record documentation, patient education.      Penni Bombard, MD 9/51/8841, 6:60 PM Certified in Neurology, Neurophysiology and Neuroimaging  [facility] [address], [place], [place] [address] [phone]

## 2020-07-12 ENCOUNTER — Other Ambulatory Visit: Payer: Self-pay

## 2020-07-16 DIAGNOSIS — M4727 Other spondylosis with radiculopathy, lumbosacral region: Secondary | ICD-10-CM | POA: Diagnosis not present

## 2020-07-17 ENCOUNTER — Telehealth: Payer: Self-pay | Admitting: Diagnostic Neuroimaging

## 2020-07-17 NOTE — Telephone Encounter (Signed)
Pt called, will be able to come in for bloodwork. Would like a call from the nurse.

## 2020-07-17 NOTE — Telephone Encounter (Signed)
Called patient who stated she can't come for labs this week. She'll come next week. She now has a scooter to use in home. She reported improvement in the use of her hands. She asked for Dr Leta Baptist to be made aware; I advised will let him know. Patient verbalized understanding, appreciation.

## 2020-07-24 ENCOUNTER — Other Ambulatory Visit (INDEPENDENT_AMBULATORY_CARE_PROVIDER_SITE_OTHER): Payer: Medicare Other

## 2020-07-24 DIAGNOSIS — G35 Multiple sclerosis: Secondary | ICD-10-CM

## 2020-07-24 DIAGNOSIS — M810 Age-related osteoporosis without current pathological fracture: Secondary | ICD-10-CM | POA: Diagnosis not present

## 2020-07-24 DIAGNOSIS — G8250 Quadriplegia, unspecified: Secondary | ICD-10-CM | POA: Diagnosis not present

## 2020-07-24 DIAGNOSIS — I10 Essential (primary) hypertension: Secondary | ICD-10-CM | POA: Diagnosis not present

## 2020-07-24 DIAGNOSIS — Z0289 Encounter for other administrative examinations: Secondary | ICD-10-CM

## 2020-07-24 DIAGNOSIS — E782 Mixed hyperlipidemia: Secondary | ICD-10-CM | POA: Diagnosis not present

## 2020-07-24 DIAGNOSIS — G8929 Other chronic pain: Secondary | ICD-10-CM | POA: Diagnosis not present

## 2020-07-25 LAB — CBC WITH DIFFERENTIAL/PLATELET
Basophils Absolute: 0 10*3/uL (ref 0.0–0.2)
Basos: 1 %
EOS (ABSOLUTE): 0.2 10*3/uL (ref 0.0–0.4)
Eos: 3 %
Hematocrit: 35.6 % (ref 34.0–46.6)
Hemoglobin: 11.6 g/dL (ref 11.1–15.9)
Immature Grans (Abs): 0 10*3/uL (ref 0.0–0.1)
Immature Granulocytes: 1 %
Lymphocytes Absolute: 0.7 10*3/uL (ref 0.7–3.1)
Lymphs: 11 %
MCH: 30.2 pg (ref 26.6–33.0)
MCHC: 32.6 g/dL (ref 31.5–35.7)
MCV: 93 fL (ref 79–97)
Monocytes Absolute: 0.8 10*3/uL (ref 0.1–0.9)
Monocytes: 12 %
Neutrophils Absolute: 4.6 10*3/uL (ref 1.4–7.0)
Neutrophils: 72 %
Platelets: 319 10*3/uL (ref 150–450)
RBC: 3.84 x10E6/uL (ref 3.77–5.28)
RDW: 12.8 % (ref 11.7–15.4)
WBC: 6.3 10*3/uL (ref 3.4–10.8)

## 2020-07-25 LAB — COMPREHENSIVE METABOLIC PANEL
ALT: 26 IU/L (ref 0–32)
AST: 20 IU/L (ref 0–40)
Albumin/Globulin Ratio: 2.6 — ABNORMAL HIGH (ref 1.2–2.2)
Albumin: 4.7 g/dL (ref 3.7–4.7)
Alkaline Phosphatase: 50 IU/L (ref 44–121)
BUN/Creatinine Ratio: 38 — ABNORMAL HIGH (ref 12–28)
BUN: 24 mg/dL (ref 8–27)
Bilirubin Total: <0.2 mg/dL (ref 0.0–1.2)
CO2: 24 mmol/L (ref 20–29)
Calcium: 9 mg/dL (ref 8.7–10.3)
Chloride: 103 mmol/L (ref 96–106)
Creatinine, Ser: 0.64 mg/dL (ref 0.57–1.00)
Globulin, Total: 1.8 g/dL (ref 1.5–4.5)
Glucose: 83 mg/dL (ref 65–99)
Potassium: 4.9 mmol/L (ref 3.5–5.2)
Sodium: 141 mmol/L (ref 134–144)
Total Protein: 6.5 g/dL (ref 6.0–8.5)
eGFR: 93 mL/min/{1.73_m2} (ref >59)

## 2020-07-26 DIAGNOSIS — G35 Multiple sclerosis: Secondary | ICD-10-CM | POA: Diagnosis not present

## 2020-08-08 ENCOUNTER — Encounter: Payer: Self-pay | Admitting: *Deleted

## 2020-08-13 DIAGNOSIS — M5137 Other intervertebral disc degeneration, lumbosacral region: Secondary | ICD-10-CM | POA: Diagnosis not present

## 2020-08-13 DIAGNOSIS — M47817 Spondylosis without myelopathy or radiculopathy, lumbosacral region: Secondary | ICD-10-CM | POA: Diagnosis not present

## 2020-08-13 DIAGNOSIS — G894 Chronic pain syndrome: Secondary | ICD-10-CM | POA: Diagnosis not present

## 2020-08-13 DIAGNOSIS — M545 Low back pain, unspecified: Secondary | ICD-10-CM | POA: Diagnosis not present

## 2020-08-17 DIAGNOSIS — M810 Age-related osteoporosis without current pathological fracture: Secondary | ICD-10-CM | POA: Diagnosis not present

## 2020-08-24 DIAGNOSIS — N3000 Acute cystitis without hematuria: Secondary | ICD-10-CM | POA: Diagnosis not present

## 2020-08-24 DIAGNOSIS — N311 Reflex neuropathic bladder, not elsewhere classified: Secondary | ICD-10-CM | POA: Diagnosis not present

## 2020-08-24 DIAGNOSIS — R351 Nocturia: Secondary | ICD-10-CM | POA: Diagnosis not present

## 2020-08-24 DIAGNOSIS — R8279 Other abnormal findings on microbiological examination of urine: Secondary | ICD-10-CM | POA: Diagnosis not present

## 2020-08-24 DIAGNOSIS — N3941 Urge incontinence: Secondary | ICD-10-CM | POA: Diagnosis not present

## 2020-09-13 ENCOUNTER — Other Ambulatory Visit: Payer: Self-pay | Admitting: Diagnostic Neuroimaging

## 2020-09-13 DIAGNOSIS — I10 Essential (primary) hypertension: Secondary | ICD-10-CM | POA: Diagnosis not present

## 2020-09-13 DIAGNOSIS — M810 Age-related osteoporosis without current pathological fracture: Secondary | ICD-10-CM | POA: Diagnosis not present

## 2020-09-13 DIAGNOSIS — E782 Mixed hyperlipidemia: Secondary | ICD-10-CM | POA: Diagnosis not present

## 2020-09-13 DIAGNOSIS — G8929 Other chronic pain: Secondary | ICD-10-CM | POA: Diagnosis not present

## 2020-09-17 ENCOUNTER — Telehealth: Payer: Self-pay | Admitting: *Deleted

## 2020-09-17 ENCOUNTER — Encounter: Payer: Self-pay | Admitting: *Deleted

## 2020-09-17 NOTE — Telephone Encounter (Signed)
Received Tysabri 6 month discontinuation questionnaire from [facility]. Placed on Dr [employer] desk for review, signature. Form signed, faxed to [place], received confirmation.

## 2020-10-09 DIAGNOSIS — R351 Nocturia: Secondary | ICD-10-CM | POA: Diagnosis not present

## 2020-10-09 DIAGNOSIS — N3000 Acute cystitis without hematuria: Secondary | ICD-10-CM | POA: Diagnosis not present

## 2020-10-09 DIAGNOSIS — R8271 Bacteriuria: Secondary | ICD-10-CM | POA: Diagnosis not present

## 2020-10-09 DIAGNOSIS — N3941 Urge incontinence: Secondary | ICD-10-CM | POA: Diagnosis not present

## 2020-10-17 DIAGNOSIS — M533 Sacrococcygeal disorders, not elsewhere classified: Secondary | ICD-10-CM | POA: Diagnosis not present

## 2020-10-17 DIAGNOSIS — M47817 Spondylosis without myelopathy or radiculopathy, lumbosacral region: Secondary | ICD-10-CM | POA: Diagnosis not present

## 2020-10-17 DIAGNOSIS — G894 Chronic pain syndrome: Secondary | ICD-10-CM | POA: Diagnosis not present

## 2020-10-17 DIAGNOSIS — M5137 Other intervertebral disc degeneration, lumbosacral region: Secondary | ICD-10-CM | POA: Diagnosis not present

## 2020-10-29 DIAGNOSIS — L821 Other seborrheic keratosis: Secondary | ICD-10-CM | POA: Diagnosis not present

## 2020-10-29 DIAGNOSIS — L812 Freckles: Secondary | ICD-10-CM | POA: Diagnosis not present

## 2020-10-29 DIAGNOSIS — L814 Other melanin hyperpigmentation: Secondary | ICD-10-CM | POA: Diagnosis not present

## 2020-10-29 DIAGNOSIS — D225 Melanocytic nevi of trunk: Secondary | ICD-10-CM | POA: Diagnosis not present

## 2020-10-30 DIAGNOSIS — Z23 Encounter for immunization: Secondary | ICD-10-CM | POA: Diagnosis not present

## 2020-11-06 ENCOUNTER — Other Ambulatory Visit: Payer: Self-pay | Admitting: Family Medicine

## 2020-11-06 DIAGNOSIS — Z1231 Encounter for screening mammogram for malignant neoplasm of breast: Secondary | ICD-10-CM

## 2020-11-14 DIAGNOSIS — G8929 Other chronic pain: Secondary | ICD-10-CM | POA: Diagnosis not present

## 2020-11-14 DIAGNOSIS — M4727 Other spondylosis with radiculopathy, lumbosacral region: Secondary | ICD-10-CM | POA: Diagnosis not present

## 2020-11-14 DIAGNOSIS — E782 Mixed hyperlipidemia: Secondary | ICD-10-CM | POA: Diagnosis not present

## 2020-11-14 DIAGNOSIS — I10 Essential (primary) hypertension: Secondary | ICD-10-CM | POA: Diagnosis not present

## 2020-11-14 DIAGNOSIS — M810 Age-related osteoporosis without current pathological fracture: Secondary | ICD-10-CM | POA: Diagnosis not present

## 2020-11-22 DIAGNOSIS — E782 Mixed hyperlipidemia: Secondary | ICD-10-CM | POA: Diagnosis not present

## 2020-11-22 DIAGNOSIS — G8929 Other chronic pain: Secondary | ICD-10-CM | POA: Diagnosis not present

## 2020-11-22 DIAGNOSIS — M810 Age-related osteoporosis without current pathological fracture: Secondary | ICD-10-CM | POA: Diagnosis not present

## 2020-11-22 DIAGNOSIS — I10 Essential (primary) hypertension: Secondary | ICD-10-CM | POA: Diagnosis not present

## 2020-12-10 ENCOUNTER — Ambulatory Visit: Payer: Medicare Other | Admitting: Internal Medicine

## 2020-12-12 ENCOUNTER — Ambulatory Visit
Admission: RE | Admit: 2020-12-12 | Discharge: 2020-12-12 | Disposition: A | Discharge status: Home / Self Care | Payer: Medicare Other | Source: Ambulatory Visit | Attending: Family Medicine | Admitting: Family Medicine

## 2020-12-12 ENCOUNTER — Other Ambulatory Visit: Payer: Self-pay

## 2020-12-12 DIAGNOSIS — Z1231 Encounter for screening mammogram for malignant neoplasm of breast: Secondary | ICD-10-CM | POA: Diagnosis not present

## 2020-12-17 DIAGNOSIS — M5137 Other intervertebral disc degeneration, lumbosacral region: Secondary | ICD-10-CM | POA: Diagnosis not present

## 2020-12-17 DIAGNOSIS — Z79891 Long term (current) use of opiate analgesic: Secondary | ICD-10-CM | POA: Diagnosis not present

## 2020-12-17 DIAGNOSIS — Z79899 Other long term (current) drug therapy: Secondary | ICD-10-CM | POA: Diagnosis not present

## 2020-12-17 DIAGNOSIS — G894 Chronic pain syndrome: Secondary | ICD-10-CM | POA: Diagnosis not present

## 2020-12-17 DIAGNOSIS — M533 Sacrococcygeal disorders, not elsewhere classified: Secondary | ICD-10-CM | POA: Diagnosis not present

## 2020-12-17 DIAGNOSIS — M47817 Spondylosis without myelopathy or radiculopathy, lumbosacral region: Secondary | ICD-10-CM | POA: Diagnosis not present

## 2020-12-31 DIAGNOSIS — I10 Essential (primary) hypertension: Secondary | ICD-10-CM | POA: Diagnosis not present

## 2020-12-31 DIAGNOSIS — N390 Urinary tract infection, site not specified: Secondary | ICD-10-CM | POA: Diagnosis not present

## 2020-12-31 DIAGNOSIS — D649 Anemia, unspecified: Secondary | ICD-10-CM | POA: Diagnosis not present

## 2021-02-11 DIAGNOSIS — M533 Sacrococcygeal disorders, not elsewhere classified: Secondary | ICD-10-CM | POA: Diagnosis not present

## 2021-02-11 DIAGNOSIS — G894 Chronic pain syndrome: Secondary | ICD-10-CM | POA: Diagnosis not present

## 2021-02-11 DIAGNOSIS — M5137 Other intervertebral disc degeneration, lumbosacral region: Secondary | ICD-10-CM | POA: Diagnosis not present

## 2021-02-11 DIAGNOSIS — M47817 Spondylosis without myelopathy or radiculopathy, lumbosacral region: Secondary | ICD-10-CM | POA: Diagnosis not present

## 2021-02-12 ENCOUNTER — Telehealth: Payer: Self-pay | Admitting: Diagnostic Neuroimaging

## 2021-02-12 ENCOUNTER — Encounter: Payer: Self-pay | Admitting: *Deleted

## 2021-02-12 DIAGNOSIS — G35 Multiple sclerosis: Secondary | ICD-10-CM

## 2021-02-12 MED ORDER — DIMETHYL FUMARATE 240 MG PO CPDR: 240.0000 mg | DELAYED_RELEASE_CAPSULE | Freq: Two times a day (BID) | ORAL | 5 refills | Status: DC

## 2021-02-12 NOTE — Telephone Encounter (Signed)
Pt is asking that the Tecfidera be called into [facility]

## 2021-02-12 NOTE — Telephone Encounter (Signed)
Dimethyl fumarate refilled x 6 months to [facility] as patient requested.

## 2021-02-18 DIAGNOSIS — M4727 Other spondylosis with radiculopathy, lumbosacral region: Secondary | ICD-10-CM | POA: Diagnosis not present

## 2021-02-22 DIAGNOSIS — M810 Age-related osteoporosis without current pathological fracture: Secondary | ICD-10-CM | POA: Diagnosis not present

## 2021-03-28 DIAGNOSIS — N3941 Urge incontinence: Secondary | ICD-10-CM | POA: Diagnosis not present

## 2021-03-28 DIAGNOSIS — N311 Reflex neuropathic bladder, not elsewhere classified: Secondary | ICD-10-CM | POA: Diagnosis not present

## 2021-03-28 DIAGNOSIS — N3000 Acute cystitis without hematuria: Secondary | ICD-10-CM | POA: Diagnosis not present

## 2021-04-01 ENCOUNTER — Ambulatory Visit (INDEPENDENT_AMBULATORY_CARE_PROVIDER_SITE_OTHER): Payer: Medicare Other | Admitting: Diagnostic Neuroimaging

## 2021-04-01 ENCOUNTER — Encounter: Payer: Self-pay | Admitting: Diagnostic Neuroimaging

## 2021-04-01 VITALS — BP 113/71 | HR 108 | Ht 61.0 in | Wt 109.0 lb

## 2021-04-01 DIAGNOSIS — R269 Unspecified abnormalities of gait and mobility: Secondary | ICD-10-CM

## 2021-04-01 DIAGNOSIS — G35 Multiple sclerosis: Secondary | ICD-10-CM

## 2021-04-01 NOTE — Progress Notes (Signed)
[facility]  PATIENT: Amy Bray DOB: 30-Jul-1945  REFERRING CLINICIAN:  HISTORY FROM: patient REASON FOR VISIT: follow up   HISTORICAL  CHIEF COMPLAINT:  Chief Complaint  Patient presents with   Follow-up    RM 6 alone Pt is well and stable, she has been improving significantly since medication change.    UPDATE (04/01/21, VRP): Since last visit, doing about the same. Symptoms are stable. No alleviating or aggravating factors. Tolerating meds. Mother and husband passed away Jan/Jeb 2023.  UPDATE (07/11/20, VRP): Since last visit, doing well! Now on tecfidera generic. More energy and balance. Also improved weigfht gain. Less stress.   UPDATE (03/12/20, VRP): Since last visit, symptoms are continuing to progress.  Her mobility and ADL functioning have declined.  It may take her up to 20 minutes to get out of bed.  It may take her 30 to 40 minutes to get dressed.  She is still living alone, but has medications and groceries delivered to her.  She is having more difficulty preparing meals and reaching up to the microwave.  She is having more problems with incontinence issues.  Her mother is living at an assisted living, husband at memory care center, and patient does not want to change her location yet.  She is looking at getting some assistance with aids and housekeepers.  She would like to be considered for a power wheelchair or power scooter.  UPDATE (05/30/19, VRP): Since last visit, doing poorly; husband having severe dementia issues causing significant stress. Patient is struggling with gait and weakness.    UPDATE (11/08/18, VRP): Since last visit, doing about the same. MS symptoms are stable. Severity moderate. No alleviating or aggravating factors. Tolerating meds. Now more caregiver stress, as her husband has progressive dementia.   UPDATE (12/07/17, VRP): Since last visit, now with swelling in feet x 2 months. Improved in last 1-2 weeks. No pain. No  triggers. Had 1 day with some addl leg weakness (1 hour) then resolved on 11/29/17. Symptoms are now at baseline. Continues PT and water therapy. Tolerating tysabri.    UPDATE (08/10/17, VRP): Since last visit, doing well. Tolerating tysabri and generic amprya. No alleviating or aggravating factors. Hot flashes improved. Fatigue improved.   UPDATE (02/11/17, VRP): Since last visit, doing well. Tolerating meds. No alleviating or aggravating factors. Now on xeomin (Aug and Dec 2018) and spasticity improved. On generic ampyra and doing well.   UPDATE 08/11/16: Since last visit, doing well. Tolerating tysabri and baclofen. Heat has been challenging with spasms. Gait diff with knees rubbing together, slightly worse.  UPDATE 02/11/16: Since last visit, balance diff continues. No flares. MRIs stable. Tolerating tysabri.   UPDATE 10/08/15: Since last visit, pt feels some progression of gait diff and walking problems. No specific attack or flare up.  UPDATE 07/04/15: Since last visit, doing well. Tolerating tysabri and ampyra.  UPDATE 04/02/15: Since last visit, doing well. Tolerating tysabri and ampyra. Back on nortriptyline (is helping burning pain in feet).   UPDATE 01/03/15: Since last visit, doing better on ampyra (better gait and walking speed). More foot/calf tingling burning pain x 1-2 months. Now with Dr. Andree Elk pain mgmt for back injections x 2, which is helping.  UPDATE 10/02/14: Since last visit, doing well. Requesting transition of care to [place] for pain mgmt; previously at [facility] with Dr. Oliva Bustard.  UPDATE 05/22/14: Since last visit, doing well. On tysabri since March 2016. Fatigue and bladder control much improved. Gait is stable.   UPDATE  02/20/14: Since last visit, had reviewed lab results and now ready to start MS medication. Now would like to start tysabri.  UPDATE 01/09/14: Since last visit, no new events symptoms. LP results reviewed. Didn't get lab work at last visit for MS mimics.   UPDATE  12/07/13: Patient is stable with symptoms. MRI scans reviewed. Continues to have weakness in legs and balance difficulty.  UPDATE 11/21/13: 76 year old right-handed female here for evaluation of lower extremity spasms and weakness. 2002 patient developed onset of right leg stiffness, spasm, weakness. Patient was diagnosed with cervical spine disease, bone spur ridging, and treated with anterior cervical discectomy and fusion in 2003. Following this her right leg weakness and spasms significantly improved. However one year later her symptoms return. Patient followed up with several surgeons and ultimately had a second cervical spine surgery in 2006. Following this patient had slight improvement of right leg however within several months her weakness, spasms and gait continued to decline. Patient transitioned needing a cane to walk. 2013 patient was evaluated by a neurologist in [place], to evaluate her right leg weakness. Apparently patient had a "multiple sclerosis" workup including MRI of the brain and extensive blood testing. Patient also had EMG nerve conduction studies looking for peroneal neuropathy but apparently all studies were normal. MRI of the brain did show a right medulla lesion which was reported as a stroke. Over the past 1 year patient has developed new onset left leg weakness, spasm, stiffness. Patient followed up with orthopedic surgery who then referred patient to me for evaluation. Last MRI of cervical spine was in 2006 at [facility]. No bowel or bladder incontinence. No significant numbness or tingling. No weakness in upper extremities. No vision loss, vision changes, slurred speech or trouble talking.   REVIEW OF SYSTEMS: Full 14 system review of systems performed and negative except: as per HPI   ALLERGIES: No Known Allergies  HOME MEDICATIONS: Outpatient Medications Prior to Visit  Medication Sig Dispense Refill   acetaminophen (TYLENOL) 500 MG tablet Take 500 mg by mouth as  needed.     baclofen (LIORESAL) 10 MG tablet TAKE TWO TABLETS BY MOUTH THREE TIMES DAILY AS NEEDED 540 tablet 1   Cranberry-Vitamin C-Vitamin E 4200-20-3 MG-MG-UNIT CAPS Take by mouth.     denosumab (PROLIA) 60 MG/ML SOLN injection Inject 60 mg into the skin every 6 (six) months. Administer in upper arm, thigh, or abdomen     Dimethyl Fumarate 240 MG CPDR Take 1 capsule (240 mg total) by mouth 2 (two) times daily. 60 capsule 5   Ibuprofen (ADVIL PO) Take by mouth in the morning and at bedtime.     lisinopril (PRINIVIL,ZESTRIL) 10 MG tablet Take 1 tablet by mouth daily.     MYRBETRIQ 25 MG TB24 tablet Take by mouth.     nortriptyline (PAMELOR) 50 MG capsule Take 1 capsule by mouth daily.     oxybutynin (DITROPAN-XL) 5 MG 24 hr tablet Take 5 mg by mouth daily.     traMADol-acetaminophen (ULTRACET) 37.5-325 MG per tablet Take 1 tablet by mouth daily as needed.     UNABLE TO FIND Med Name: CBD oil at bedtime     No facility-administered medications prior to visit.    PAST MEDICAL HISTORY: Past Medical History:  Diagnosis Date   Cervical myelopathy ([place])    Multiple sclerosis ([place])    Osteoporosis 2018   hip    PAST SURGICAL HISTORY: Past Surgical History:  Procedure Laterality Date   CERVICAL FUSION  01/2001, 01/2004   C3-C6    FAMILY HISTORY: Family History  Problem Relation Age of Onset   Transient ischemic attack Mother    Pancreatic cancer Father    Breast cancer Cousin     SOCIAL HISTORY:  Social History   Socioeconomic History   Marital status: Married    Spouse name: Amali Uhls   Number of children: 0   Years of education: College   Highest education level: Not on file  Occupational History    Employer: OTHER    Comment: n/a  Tobacco Use   Smoking status: Former    Packs/day: 1.00    Years: 15.00    Pack years: 15.00    Types: Cigarettes    Quit date: 01/21/1983    Years since quitting: 38.2   Smokeless tobacco: Never  Substance and Sexual Activity    Alcohol use: Yes    Alcohol/week: 0.0 standard drinks    Comment: occasionally- wine   Drug use: No   Sexual activity: Not on file  Other Topics Concern   Not on file  Social History Narrative   05/30/19 Patient lives at home alone,  Spouse in SNF for  dementia.   Caffeine Use-none   Social Determinants of Health   Financial Resource Strain: Not on file  Food Insecurity: Not on file  Transportation Needs: Not on file  Physical Activity: Not on file  Stress: Not on file  Social Connections: Not on file  Intimate Partner Violence: Not on file     PHYSICAL EXAM  GENERAL EXAM/CONSTITUTIONAL: Vitals:  Vitals:   04/01/21 1519  BP: 113/71  Pulse: (!) 108  Weight: 109 lb (49.4 kg)  Height: '5\' 1"'$  (1.549 m)   Body mass index is 20.6 kg/m. Wt Readings from Last 3 Encounters:  04/01/21 109 lb (49.4 kg)  03/12/20 89 lb (40.4 kg)  02/08/20 97 lb 12.8 oz (44.4 kg)   Patient is in no distress; well developed, nourished and groomed; neck is supple  CARDIOVASCULAR: Examination of carotid arteries is normal; no carotid bruits Regular rate and rhythm, no murmurs Examination of peripheral vascular system by observation and palpation is normal  EYES: Ophthalmoscopic exam of optic discs and posterior segments is normal; no papilledema or hemorrhages No results found.  MUSCULOSKELETAL: Gait, strength, tone, movements noted in Neurologic exam below  NEUROLOGIC: MENTAL STATUS:  No flowsheet data found. awake, alert, oriented to person, place and time recent and remote memory intact normal attention and concentration language fluent, comprehension intact, naming intact fund of knowledge appropriate  CRANIAL NERVE:  2nd - no papilledema on fundoscopic exam 2nd, 3rd, 4th, 6th - pupils equal and reactive to light, visual fields full to confrontation, extraocular muscles intact, no nystagmus 5th - facial sensation symmetric 7th - facial strength symmetric 8th - hearing  intact 9th - palate elevates symmetrically, uvula midline 11th - shoulder shrug symmetric 12th - tongue protrusion midline  MOTOR:  BUE 4 BLE INCREASED TONE, 4 PROX AND 3 DISTAL  SENSORY:  normal and symmetric to light touch  COORDINATION:  finger-nose-finger, fine finger movements SLOW  REFLEXES:  deep tendon reflexes BRISK IN BUE AND BLE  GAIT/STATION:  narrow based gait; SHORT STEPS; UNSTEADY; WITH WALKER      DIAGNOSTIC DATA (LABS, IMAGING, TESTING) - I reviewed patient records, labs, notes, testing and imaging myself where available.  Lab Results  Component Value Date   WBC 6.3 07/24/2020   HGB 11.6 07/24/2020   HCT 35.6 07/24/2020  MCV 93 07/24/2020   PLT 319 07/24/2020      Component Value Date/Time   NA 141 07/24/2020 1017   K 4.9 07/24/2020 1017   CL 103 07/24/2020 1017   CO2 24 07/24/2020 1017   GLUCOSE 83 07/24/2020 1017   BUN 24 07/24/2020 1017   CREATININE 0.64 07/24/2020 1017   CALCIUM 9.0 07/24/2020 1017   PROT 6.5 07/24/2020 1017   ALBUMIN 4.7 07/24/2020 1017   AST 20 07/24/2020 1017   ALT 26 07/24/2020 1017   ALKPHOS 50 07/24/2020 1017   BILITOT <0.2 07/24/2020 1017   GFRNONAA 89 05/30/2019 1638   GFRAA 102 05/30/2019 1638   No results found for: CHOL, HDL, LDLCALC, LDLDIRECT, TRIG, CHOLHDL No results found for: HGBA1C No results found for: VITAMINB12 No results found for: TSH  Vit D, 25-Hydroxy  Date Value Ref Range Status  01/03/2015 36.8 30.0 - 100.0 ng/mL Final    Comment:    Vitamin D deficiency has been defined by the Institute of Medicine and an Endocrine Society practice guideline as a level of serum 25-OH vitamin D less than 20 ng/mL (1,2). The Endocrine Society went on to further define vitamin D insufficiency as a level between 21 and 29 ng/mL (2). 1. IOM (Institute of Medicine). 2010. Dietary reference    intakes for calcium and D. [place]: The    [employer]. 2. Holick MF, Binkley [place],  Bischoff-Ferrari HA, et al.    Evaluation, treatment, and prevention of vitamin D    deficiency: an Endocrine Society clinical practice    guideline. JCEM. 2011 Jul; 96(7):1911-30.     I reviewed images myself. In my review, I think there is a 2cm spinal cord lesion (right side, C6-C7) and a smaller left T1 lesion, suspicious for demyelinating disease, on the MRI cervical spine from 10/08/03. No adjacent disc or bone degeneration or spinal stenosis. The MRI report states that the cord appears normal. -VRP  10/08/03 MRI cervical spine (images reviewed) - Satisfactory anterior plate fusion of C4 and C5. Disk degeneration and spondylosis at C3-4 and C5-6 with biforaminal narrowing but no significant central canal stenosis. There is no disk protrusion.    12/29/13 LP - WBC 1, RBC 0, protein 26, glucose 59, OCB > 5, gram stain neg  04/26/17 MRI of the brain with and without contrast [I reviewed images myself and agree with interpretation. 1 small new lesion. -VRP]  1.   Small left occipital subcortical T2/FLAIR hyperintense focus with subtle hyperintensity on diffusion-weighted images.  This focus was not present on the 08/24/2016 MRI.  The etiology is uncertain but it could represent a sub-chronic focus of chronic ischemic or demyelinating change.   2.   There are a couple T2/FLAIR hyperintense foci in the subcortical and deep white matter.  This is a nonspecific finding and could be incidental at this age, will be due to minimal chronic microvascular ischemic change or demyelination.  None of the foci appears to be acute and there is no change when compared to the 08/24/2016 MRI. 3.   Mild generalized cortical atrophy, typical for age. 4.   There is a normal enhancement pattern.  04/26/17 MRI of the cervical spine with and without contrast [I reviewed images myself and agree with interpretation. Stable since 2017.  -VRP]  1.   There are 3 small T2/FLAIR hyperintense foci within the spinal cord adjacent to C6,  C7 and T2.  All of these were present on the 10/19/2015 MRI.  None of these enhance. 2.   Multilevel degenerative changes as detailed above.   The degenerative changes at C6-C7 have slightly progressed when compared to the previous MRI.  There did not appear to be any nerve root compression. 3.   ACDF from C3 through C6.   4.   There is a normal enhancement pattern of there are no acute findings.  04/26/17 Unremarkable MRI thoracic spine (with and without). [I reviewed images myself and agree with interpretation. -VRP]   05/19/18 MRI cervical spine (with and without) demonstrating: - Small chronic plaques at C6 on the left and C7 on the right; no acute plaques - No new plaques from MRI on 04/26/2017; prior T2 level plaque is not well visualized on current study  05/19/18 MRI thoracic spine (with and without) demonstrating:  - subtle T2 hyperintensity at C7 level on sagittal views; consistent with chronic plaque - no acute plaques; remainder of spinal cord is also unremarkable  12/17/19 MRI brain 1. In comparison to MRI from November 28, 2018, there are a few new small juxtacortical and periventricular T2/FLAIR hyperintensities in bifrontal white matter, which are nonspecific but could relate to demyelination in this patient with a reported history of multiple sclerosis. 2. No abnormal enhancement or restricted diffusion to suggest active demyelination. 3. Small remote left occipital lobe infarct.  12/17/19 MRI cervical spine 1. Possible focal T2 hyperintensity in the lateral left cord at C5 versus artifact related to adjacent fusion, similar to prior. No convincing new cord signal abnormality. No cord enhancement to suggest active demyelination. 2. C3-C6 ACDF. Similar adjacent level anterolisthesis of C6 on C7 with uncovering the disc and mild canal stenosis. Limited evaluation of the foramina at this level given motion. Suspected mild to moderate foraminal stenosis at C5-C6.   Anti-JCV  antibody 01/10/14 - 0.10 negative  05/22/14 - 0.09 negative 12/2014 - negative March 2017 - negative 10/09/15 - 0.13 negative 02/11/16 JCV antibody 0.11 negative 08-13-16 JCV antibody 0.11 negative sy 03/05/17 JCV negative 0.07 08-15-17 JCV negative, index 0.08    ASSESSMENT AND PLAN  76 y.o. year old female here with  bilateral lower extremity spasticity, weakness,with brisk reflexes in upper and lower extremities, left Hoffman sign, bilateral upgoing toes. MRIs from 2005 and 2015 demonstrate multiple spinal cord lesions. Now suspect demyelinating disease, with first attack in 2002, second attack in 2003, and 3rd attack in 2014.  Then with some progression of symptoms since mid-2017.  Some worsening balance since Jan 2019.    Dx: multiple sclerosis   No diagnosis found.     PLAN:  MULTIPLE SCLEROSIS (RRMS; secondary progression by symptoms) - continue dimethyl fumarate; annual CBC, CMP per PCP  INCONTINENCE - myrbetriq; schedule bathroom breaks; continue fiber supplement  MUSCLE SPASMS (stable) - continue baclofen  No orders of the defined types were placed in this encounter.  Return in about 1 year (around 04/02/2022).      Penni Bombard, MD 9/76/7341, 9:37 PM Certified in Neurology, Neurophysiology and Neuroimaging  [facility] [address] [place], [place] [address] [phone]

## 2021-04-08 DIAGNOSIS — Z79891 Long term (current) use of opiate analgesic: Secondary | ICD-10-CM | POA: Diagnosis not present

## 2021-04-08 DIAGNOSIS — G35 Multiple sclerosis: Secondary | ICD-10-CM | POA: Diagnosis not present

## 2021-04-08 DIAGNOSIS — M533 Sacrococcygeal disorders, not elsewhere classified: Secondary | ICD-10-CM | POA: Diagnosis not present

## 2021-04-13 ENCOUNTER — Other Ambulatory Visit: Payer: Self-pay | Admitting: Diagnostic Neuroimaging

## 2021-05-06 DIAGNOSIS — N3000 Acute cystitis without hematuria: Secondary | ICD-10-CM | POA: Diagnosis not present

## 2021-05-06 DIAGNOSIS — N311 Reflex neuropathic bladder, not elsewhere classified: Secondary | ICD-10-CM | POA: Diagnosis not present

## 2021-05-06 DIAGNOSIS — N3941 Urge incontinence: Secondary | ICD-10-CM | POA: Diagnosis not present

## 2021-05-20 DIAGNOSIS — Z79891 Long term (current) use of opiate analgesic: Secondary | ICD-10-CM | POA: Diagnosis not present

## 2021-05-20 DIAGNOSIS — M533 Sacrococcygeal disorders, not elsewhere classified: Secondary | ICD-10-CM | POA: Diagnosis not present

## 2021-06-03 DIAGNOSIS — M5417 Radiculopathy, lumbosacral region: Secondary | ICD-10-CM | POA: Diagnosis not present

## 2021-06-18 DIAGNOSIS — M533 Sacrococcygeal disorders, not elsewhere classified: Secondary | ICD-10-CM | POA: Diagnosis not present

## 2021-06-18 DIAGNOSIS — Z79891 Long term (current) use of opiate analgesic: Secondary | ICD-10-CM | POA: Diagnosis not present

## 2021-07-01 DIAGNOSIS — Z1389 Encounter for screening for other disorder: Secondary | ICD-10-CM | POA: Diagnosis not present

## 2021-07-01 DIAGNOSIS — Z23 Encounter for immunization: Secondary | ICD-10-CM | POA: Diagnosis not present

## 2021-07-01 DIAGNOSIS — M21372 Foot drop, left foot: Secondary | ICD-10-CM | POA: Diagnosis not present

## 2021-07-01 DIAGNOSIS — M810 Age-related osteoporosis without current pathological fracture: Secondary | ICD-10-CM | POA: Diagnosis not present

## 2021-07-01 DIAGNOSIS — Z0000 Encounter for general adult medical examination without abnormal findings: Secondary | ICD-10-CM | POA: Diagnosis not present

## 2021-07-01 DIAGNOSIS — Z1211 Encounter for screening for malignant neoplasm of colon: Secondary | ICD-10-CM | POA: Diagnosis not present

## 2021-07-01 DIAGNOSIS — G35 Multiple sclerosis: Secondary | ICD-10-CM | POA: Diagnosis not present

## 2021-07-01 DIAGNOSIS — G8929 Other chronic pain: Secondary | ICD-10-CM | POA: Diagnosis not present

## 2021-07-01 DIAGNOSIS — E782 Mixed hyperlipidemia: Secondary | ICD-10-CM | POA: Diagnosis not present

## 2021-07-01 DIAGNOSIS — N319 Neuromuscular dysfunction of bladder, unspecified: Secondary | ICD-10-CM | POA: Diagnosis not present

## 2021-07-01 DIAGNOSIS — I10 Essential (primary) hypertension: Secondary | ICD-10-CM | POA: Diagnosis not present

## 2021-07-01 DIAGNOSIS — D649 Anemia, unspecified: Secondary | ICD-10-CM | POA: Diagnosis not present

## 2021-07-03 DIAGNOSIS — Z1211 Encounter for screening for malignant neoplasm of colon: Secondary | ICD-10-CM | POA: Diagnosis not present

## 2021-07-29 DIAGNOSIS — L570 Actinic keratosis: Secondary | ICD-10-CM | POA: Diagnosis not present

## 2021-07-29 DIAGNOSIS — L820 Inflamed seborrheic keratosis: Secondary | ICD-10-CM | POA: Diagnosis not present

## 2021-07-29 DIAGNOSIS — L821 Other seborrheic keratosis: Secondary | ICD-10-CM | POA: Diagnosis not present

## 2021-07-30 DIAGNOSIS — H3540 Unspecified peripheral retinal degeneration: Secondary | ICD-10-CM | POA: Diagnosis not present

## 2021-08-06 ENCOUNTER — Telehealth: Payer: Self-pay | Admitting: Diagnostic Neuroimaging

## 2021-08-06 NOTE — Telephone Encounter (Signed)
Pt said she wants to talk to a nurse about starting back getting Botox shots again.

## 2021-08-07 ENCOUNTER — Other Ambulatory Visit: Payer: Self-pay | Admitting: Diagnostic Neuroimaging

## 2021-08-07 DIAGNOSIS — G35 Multiple sclerosis: Secondary | ICD-10-CM

## 2021-08-07 NOTE — Telephone Encounter (Signed)
Called patient who stated the Botox was helpful before, and it's been a long time since she had it. She stated her walking is good but her legs are stiff, and she would like to try injections again. I informed her Dr [name] is out of office, will return tomorrow. Patient would like update after Dr [name] returns. She verbalized understanding, appreciation.

## 2021-08-09 NOTE — Telephone Encounter (Signed)
Multiple sclerosis, spastic quadriplegia, gait abnormality, previous used xeomin 500 units, last injection was in January 2022  Please complete the paperwork to finish preauthorization,  We will see patient afterwards for injection

## 2021-08-12 NOTE — Telephone Encounter (Signed)
Called patient and advised her Dr [name] will be glad to resume her Xeomin injections. Insurance must approve first then she'll  get a call to schedule it. Patient verbalized understanding, appreciation. She then stated she got a letter for jury duty but had submitted letter years ago saying she has MS, can't serve. She also stated she is not supposed to be called after age 76. She has sent them a letter, will let us know if she needs anything further from [place].

## 2021-08-13 DIAGNOSIS — Z79891 Long term (current) use of opiate analgesic: Secondary | ICD-10-CM | POA: Diagnosis not present

## 2021-08-13 DIAGNOSIS — M533 Sacrococcygeal disorders, not elsewhere classified: Secondary | ICD-10-CM | POA: Diagnosis not present

## 2021-08-26 DIAGNOSIS — N311 Reflex neuropathic bladder, not elsewhere classified: Secondary | ICD-10-CM | POA: Diagnosis not present

## 2021-08-26 DIAGNOSIS — R8279 Other abnormal findings on microbiological examination of urine: Secondary | ICD-10-CM | POA: Diagnosis not present

## 2021-08-27 DIAGNOSIS — M810 Age-related osteoporosis without current pathological fracture: Secondary | ICD-10-CM | POA: Diagnosis not present

## 2021-08-29 ENCOUNTER — Telehealth: Payer: Self-pay | Admitting: Diagnostic Neuroimaging

## 2021-08-29 NOTE — Telephone Encounter (Signed)
Scheduled pt with Dr. [name] on 09/25/21 at 2:30 pm.

## 2021-08-29 NOTE — Telephone Encounter (Signed)
Pt is calling to re-schedule Botox appointment.

## 2021-09-04 DIAGNOSIS — M5417 Radiculopathy, lumbosacral region: Secondary | ICD-10-CM | POA: Diagnosis not present

## 2021-09-18 DIAGNOSIS — Z79891 Long term (current) use of opiate analgesic: Secondary | ICD-10-CM | POA: Diagnosis not present

## 2021-09-18 DIAGNOSIS — M533 Sacrococcygeal disorders, not elsewhere classified: Secondary | ICD-10-CM | POA: Diagnosis not present

## 2021-09-18 DIAGNOSIS — M5417 Radiculopathy, lumbosacral region: Secondary | ICD-10-CM | POA: Diagnosis not present

## 2021-09-25 ENCOUNTER — Ambulatory Visit (INDEPENDENT_AMBULATORY_CARE_PROVIDER_SITE_OTHER): Payer: Medicare Other | Admitting: Neurology

## 2021-09-25 VITALS — BP 134/74 | HR 64 | Ht 61.0 in | Wt 109.0 lb

## 2021-09-25 DIAGNOSIS — G8220 Paraplegia, unspecified: Secondary | ICD-10-CM

## 2021-09-25 DIAGNOSIS — R269 Unspecified abnormalities of gait and mobility: Secondary | ICD-10-CM

## 2021-09-25 DIAGNOSIS — G35 Multiple sclerosis: Secondary | ICD-10-CM | POA: Insufficient documentation

## 2021-09-25 DIAGNOSIS — G8250 Quadriplegia, unspecified: Secondary | ICD-10-CM | POA: Diagnosis not present

## 2021-09-25 MED ORDER — INCOBOTULINUMTOXINA 100 UNITS IM SOLR
500.0000 [IU] | INTRAMUSCULAR | Status: DC
  Administered 2021-09-25: 500 [IU] via INTRAMUSCULAR

## 2021-09-25 MED ORDER — INCOBOTULINUMTOXINA 100 UNITS IM SOLR
500.0000 [IU] | INTRAMUSCULAR | Status: DC
Start: 2021-09-25 — End: 2021-12-18

## 2021-09-25 NOTE — Progress Notes (Signed)
Xeomin  5- 100 unit vials used  NDC 0044-7158-06 Lot [id number]  Exp 08/2023  B/B

## 2021-09-25 NOTE — Progress Notes (Signed)
Amy AND PLAN  [name] is a 76 y.o. female   Multiple sclerosis Spastic quadriplegia Electrical stimulation guided xeomin injection, used xeomin 500 units (100 units was dissolved into 2 cc of NS)  Right adductor longus 200 units Right adductor magnus 50 units  Left adductor longus 200 units Left abductor magnus 50 units  Return to clinic in 3 months for repeat injection   DIAGNOSTIC DATA (LABS, IMAGING, TESTING) - I reviewed patient records, labs, notes, testing and imaging myself where available.   MEDICAL HISTORY:  Amy Bray, returned for botulism toxin injection for spastic bilateral lower extremity  She had a history of multiple sclerosis, presented with right leg stiffness, spasm, weakness in 2002, was diagnosed with cervical spine disease, had anterior cervical vasectomy and fusion in 2003. Following the surgery, her right leg weakness and spasms significantly improved, however a year later the symptoms returned, she ultimately had second cervical spine surgery in 2006, following second surgery, she only has mild improvement of her symptoms, she continue has progressive worsening spastic gait abnormality,  She was eventually diagnosed with multiple sclerosis, began to receive Tysabri infusion, there was no significant change taking Tysabri, was eventually switched to Tecfidera since 2022, tolerating it well,  She was receiving EMG guided Botox and toxin injection [facility] with good result, transferred to our clinic since August 2018, was receiving injection every 3 months without significant side effect, last injection was February 08, 2020, when she reported extreme stress at home, moved her mother to assisted living, have to self her mother's house, her husband suffered dementia, living memory unit,  She is also taking baclofen 10 mg 2 tablets 3 times a day as needed, Myrbetriq 25 mg every night, nortriptyline 50 mg every night, oxybutynin XL 5 mg daily,  She  go to swing regularly, continue to be independent, lost her mother and husband at the beginning of this year,   PHYSICAL EXAM:  Vitals:   09/25/21 1450  BP: 134/74  Pulse: 64    PHYSICAL EXAMNIATION:  Gen: NAD, conversant, well nourised, well groomed                     Cardiovascular: Regular rate rhythm, no peripheral edema, warm, nontender. Eyes: Conjunctivae clear without exudates or hemorrhage Neck: Supple, no carotid bruits. Pulmonary: Clear to auscultation bilaterally   NEUROLOGICAL EXAM:  MENTAL STATUS: Speech/cognition: Awake, alert, oriented to history taking and casual conversation CRANIAL NERVES: CN II: Visual fields are full to confrontation. Pupils are round equal and briskly reactive to light. CN III, IV, VI: extraocular movement are normal. No ptosis. CN V: Facial sensation is intact to light touch CN VII: Face is symmetric with normal eye closure  CN VIII: Hearing is normal to causal conversation. CN IX, X: Phonation is normal. CN XI: Head turning and shoulder shrug are intact  MOTOR: No significant upper extremity weakness, significant spasticity of bilateral lower extremity, especially tendency for bilateral hip adduction, barely antigravity movement of bilateral lower extremity proximal and some muscle weakness  REFLEXES: Brisk upper and lower extremity reflex  SENSORY: Intact to light touch,  COORDINATION: There is no trunk or limb dysmetria noted.  GAIT/STANCE: Rely on her walker, spastic, stiff, unsteady gait  REVIEW OF SYSTEMS:  Full 14 system review of systems performed and notable only for as above All other review of systems were negative.   ALLERGIES: No Known Allergies  HOME MEDICATIONS: Current Outpatient Medications  Medication Sig Dispense Refill  acetaminophen (TYLENOL) 500 MG tablet Take 500 mg by mouth as needed.     baclofen (LIORESAL) 10 MG tablet TAKE TWO TABLETS BY MOUTH THREE TIMES DAILY AS NEEDED 540 tablet 1    Cranberry-Vitamin C-Vitamin E 4200-20-3 MG-MG-UNIT CAPS Take by mouth.     denosumab (PROLIA) 60 MG/ML SOLN injection Inject 60 mg into the skin every 6 (six) months. Administer in upper arm, thigh, or abdomen     Dimethyl Fumarate 240 MG CPDR TAKE 1 CAPSULE ('240MG'$ ) BY MOUTH  TWICE DAILY 60 capsule 5   Ibuprofen (ADVIL PO) Take by mouth in the morning and at bedtime.     lisinopril (PRINIVIL,ZESTRIL) 10 MG tablet Take 1 tablet by mouth daily.     MYRBETRIQ 25 MG TB24 tablet Take by mouth.     nortriptyline (PAMELOR) 50 MG capsule Take 1 capsule by mouth daily.     oxybutynin (DITROPAN-XL) 5 MG 24 hr tablet Take 5 mg by mouth daily.     traMADol-acetaminophen (ULTRACET) 37.5-325 MG per tablet Take 1 tablet by mouth daily as needed.     UNABLE TO FIND Med Name: CBD oil at bedtime     No current facility-administered medications for this visit.    PAST MEDICAL HISTORY: Past Medical History:  Diagnosis Date   Cervical myelopathy ([place])    Multiple sclerosis ([place])    Osteoporosis 2018   hip    PAST SURGICAL HISTORY: Past Surgical History:  Procedure Laterality Date   CERVICAL FUSION  01/2001, 01/2004   C3-C6    FAMILY HISTORY: Family History  Problem Relation Age of Onset   Transient ischemic attack Mother    Pancreatic cancer Father    Breast cancer Cousin     SOCIAL HISTORY: Social History   Socioeconomic History   Marital status: Married    Spouse name: Lorena Benham   Number of children: 0   Years of education: College   Highest education level: Not on file  Occupational History    Employer: OTHER    Comment: n/a  Tobacco Use   Smoking status: Former    Packs/day: 1.00    Years: 15.00    Total pack years: 15.00    Types: Cigarettes    Quit date: 01/21/1983    Years since quitting: 38.7   Smokeless tobacco: Never  Substance and Sexual Activity   Alcohol use: Yes    Alcohol/week: 0.0 standard drinks of alcohol    Comment: occasionally- wine   Drug use: No    Sexual activity: Not on file  Other Topics Concern   Not on file  Social History Narrative   05/30/19 Patient lives at home alone,  Spouse in SNF for  dementia.   Caffeine Use-none   Social Determinants of Health   Financial Resource Strain: Not on file  Food Insecurity: Not on file  Transportation Needs: Not on file  Physical Activity: Not on file  Stress: Not on file  Social Connections: Not on file  Intimate Partner Violence: Not on file      Marcial Pacas, M.D. Ph.D.  [facility] [address] [place], [place] [address] Ph: [phone] Fax: [phone]  CC:  Hulan Fess, MD [place],  [place] [address]  Kristen Loader, FNP

## 2021-10-17 DIAGNOSIS — Z23 Encounter for immunization: Secondary | ICD-10-CM | POA: Diagnosis not present

## 2021-11-06 DIAGNOSIS — Z79891 Long term (current) use of opiate analgesic: Secondary | ICD-10-CM | POA: Diagnosis not present

## 2021-11-06 DIAGNOSIS — M5417 Radiculopathy, lumbosacral region: Secondary | ICD-10-CM | POA: Diagnosis not present

## 2021-11-06 DIAGNOSIS — M533 Sacrococcygeal disorders, not elsewhere classified: Secondary | ICD-10-CM | POA: Diagnosis not present

## 2021-11-08 DIAGNOSIS — Z23 Encounter for immunization: Secondary | ICD-10-CM | POA: Diagnosis not present

## 2021-11-14 ENCOUNTER — Other Ambulatory Visit: Payer: Self-pay | Admitting: Family Medicine

## 2021-11-14 DIAGNOSIS — Z1231 Encounter for screening mammogram for malignant neoplasm of breast: Secondary | ICD-10-CM

## 2021-11-22 DIAGNOSIS — R8271 Bacteriuria: Secondary | ICD-10-CM | POA: Diagnosis not present

## 2021-11-22 DIAGNOSIS — N3000 Acute cystitis without hematuria: Secondary | ICD-10-CM | POA: Diagnosis not present

## 2021-11-22 DIAGNOSIS — N3941 Urge incontinence: Secondary | ICD-10-CM | POA: Diagnosis not present

## 2021-12-18 ENCOUNTER — Encounter: Payer: Self-pay | Admitting: Neurology

## 2021-12-18 ENCOUNTER — Ambulatory Visit (INDEPENDENT_AMBULATORY_CARE_PROVIDER_SITE_OTHER): Payer: Medicare Other | Admitting: Neurology

## 2021-12-18 VITALS — BP 111/74 | HR 103 | Ht 61.0 in | Wt 108.0 lb

## 2021-12-18 DIAGNOSIS — G35 Multiple sclerosis: Secondary | ICD-10-CM

## 2021-12-18 DIAGNOSIS — G8250 Quadriplegia, unspecified: Secondary | ICD-10-CM

## 2021-12-18 MED ORDER — INCOBOTULINUMTOXINA 100 UNITS IM SOLR
500.0000 [IU] | INTRAMUSCULAR | Status: DC
  Administered 2021-12-18: 500 [IU] via INTRAMUSCULAR

## 2021-12-18 NOTE — Progress Notes (Signed)
Xeomin 100 units x 5 [id number] Exp-2025-09 [id number] B/B

## 2021-12-18 NOTE — Progress Notes (Signed)
ASSESSMENT AND PLAN  Amy Bray is a 76 y.o. female   Multiple sclerosis Spastic quadriplegia Electrical stimulation guided xeomin injection, used xeomin 500 units (100 units was dissolved into 2 cc of NS)  Right adductor longus 200 units Right adductor magnus 50 units  Left adductor longus 200 units Left abductor magnus 50 units  Return to clinic in 3 months for repeat injection   DIAGNOSTIC DATA (LABS, IMAGING, TESTING) - I reviewed patient records, labs, notes, testing and imaging myself where available.   MEDICAL HISTORY:  Amy Bray, returned for botulism toxin injection for spastic bilateral lower extremity  She had a history of multiple sclerosis, presented with right leg stiffness, spasm, weakness in 2002, was diagnosed with cervical spine disease, had anterior cervical vasectomy and fusion in 2003. Following the surgery, her right leg weakness and spasms significantly improved, however a year later the symptoms returned, she ultimately had second cervical spine surgery in 2006, following second surgery, she only has mild improvement of her symptoms, she continue has progressive worsening spastic gait abnormality,  She was eventually diagnosed with multiple sclerosis, began to receive Tysabri infusion, there was no significant change taking Tysabri, was eventually switched to Tecfidera since 2022, tolerating it well,  She was receiving EMG guided Botox and toxin injection [facility] with good result, transferred to our clinic since August 2018, was receiving injection every 3 months without significant side effect, last injection was February 08, 2020, when she reported extreme stress at home, moved her mother to assisted living, have to self her mother's house, her husband suffered dementia, living memory unit,  She is also taking baclofen 10 mg 2 tablets 3 times a day as needed, Myrbetriq 25 mg every night, nortriptyline 50 mg every night, oxybutynin XL 5 mg daily,  She  go to swing regularly, continue to be independent, lost her mother and husband at the beginning of this year,  Update December 18, 2021: She was found to injection well, does help her bilateral hip adduction, spasm,  PHYSICAL EXAM:  Vitals:   12/18/21 1445  BP: 111/74  Pulse: (!) 103   MOTOR: No significant upper extremity weakness, significant spasticity of bilateral lower extremity,  tendency for bilateral hip adduction, barely antigravity movement of bilateral lower extremity proximal and some muscle weakness  GAIT/STANCE: Rely on her walker, spastic, stiff, unsteady gait  REVIEW OF SYSTEMS:  Full 14 system review of systems performed and notable only for as above All other review of systems were negative.   ALLERGIES: No Known Allergies  HOME MEDICATIONS: Current Outpatient Medications  Medication Sig Dispense Refill   acetaminophen (TYLENOL) 500 MG tablet Take 500 mg by mouth as needed.     baclofen (LIORESAL) 10 MG tablet TAKE TWO TABLETS BY MOUTH THREE TIMES DAILY AS NEEDED 540 tablet 1   Cranberry-Vitamin C-Vitamin E 4200-20-3 MG-MG-UNIT CAPS Take by mouth.     denosumab (PROLIA) 60 MG/ML SOLN injection Inject 60 mg into the skin every 6 (six) months. Administer in upper arm, thigh, or abdomen     Dimethyl Fumarate 240 MG CPDR TAKE 1 CAPSULE ('240MG'$ ) BY MOUTH  TWICE DAILY 60 capsule 5   Ibuprofen (ADVIL PO) Take by mouth in the morning and at bedtime.     lisinopril (PRINIVIL,ZESTRIL) 10 MG tablet Take 1 tablet by mouth daily.     MYRBETRIQ 25 MG TB24 tablet Take by mouth.     nortriptyline (PAMELOR) 50 MG capsule Take 1 capsule by mouth daily.  oxybutynin (DITROPAN-XL) 5 MG 24 hr tablet Take 5 mg by mouth daily.     traMADol-acetaminophen (ULTRACET) 37.5-325 MG per tablet Take 1 tablet by mouth daily as needed.     UNABLE TO FIND Med Name: CBD oil at bedtime     Current Facility-Administered Medications  Medication Dose Route Frequency Provider Last Rate Last Admin    incobotulinumtoxinA (XEOMIN) 100 units injection 500 Units  500 Units Intramuscular Q90 days Marcial Pacas, MD       incobotulinumtoxinA [facility]) 100 units injection 500 Units  500 Units Intramuscular Q90 days Marcial Pacas, MD   500 Units at 09/25/21 1517    PAST MEDICAL HISTORY: Past Medical History:  Diagnosis Date   Cervical myelopathy ([place])    Multiple sclerosis ([place])    Osteoporosis 2018   hip    PAST SURGICAL HISTORY: Past Surgical History:  Procedure Laterality Date   CERVICAL FUSION  01/2001, 01/2004   C3-C6    FAMILY HISTORY: Family History  Problem Relation Age of Onset   Transient ischemic attack Mother    Pancreatic cancer Father    Breast cancer Cousin     SOCIAL HISTORY: Social History   Socioeconomic History   Marital status: Married    Spouse name: Dannisha Eckmann   Number of children: 0   Years of education: College   Highest education level: Not on file  Occupational History    Employer: OTHER    Comment: n/a  Tobacco Use   Smoking status: Former    Packs/day: 1.00    Years: 15.00    Total pack years: 15.00    Types: Cigarettes    Quit date: 01/21/1983    Years since quitting: 38.9   Smokeless tobacco: Never  Substance and Sexual Activity   Alcohol use: Yes    Alcohol/week: 0.0 standard drinks of alcohol    Comment: occasionally- wine   Drug use: No   Sexual activity: Not on file  Other Topics Concern   Not on file  Social History Narrative   05/30/19 Patient lives at home alone,  Spouse in SNF for  dementia.   Caffeine Use-none   Social Determinants of Health   Financial Resource Strain: Not on file  Food Insecurity: Not on file  Transportation Needs: Not on file  Physical Activity: Not on file  Stress: Not on file  Social Connections: Not on file  Intimate Partner Violence: Not on file      Marcial Pacas, M.D. Ph.D.  [facility] [address], [place], [place] [address] Ph: [phone] Fax:  [phone]  CC:  Kristen Loader, [place],  [place] [address]  Kristen Loader, FNP

## 2021-12-23 DIAGNOSIS — Z79891 Long term (current) use of opiate analgesic: Secondary | ICD-10-CM | POA: Diagnosis not present

## 2021-12-23 DIAGNOSIS — M533 Sacrococcygeal disorders, not elsewhere classified: Secondary | ICD-10-CM | POA: Diagnosis not present

## 2021-12-23 DIAGNOSIS — M5417 Radiculopathy, lumbosacral region: Secondary | ICD-10-CM | POA: Diagnosis not present

## 2022-01-07 ENCOUNTER — Other Ambulatory Visit: Payer: Self-pay | Admitting: Family Medicine

## 2022-01-07 DIAGNOSIS — M810 Age-related osteoporosis without current pathological fracture: Secondary | ICD-10-CM | POA: Diagnosis not present

## 2022-01-07 DIAGNOSIS — I10 Essential (primary) hypertension: Secondary | ICD-10-CM | POA: Diagnosis not present

## 2022-01-07 DIAGNOSIS — G35 Multiple sclerosis: Secondary | ICD-10-CM | POA: Diagnosis not present

## 2022-01-07 DIAGNOSIS — Z6820 Body mass index (BMI) 20.0-20.9, adult: Secondary | ICD-10-CM | POA: Diagnosis not present

## 2022-01-10 ENCOUNTER — Ambulatory Visit
Admission: RE | Admit: 2022-01-10 | Discharge: 2022-01-10 | Disposition: A | Discharge status: Home / Self Care | Payer: Medicare Other | Source: Ambulatory Visit | Attending: Family Medicine | Admitting: Family Medicine

## 2022-01-10 DIAGNOSIS — Z1231 Encounter for screening mammogram for malignant neoplasm of breast: Secondary | ICD-10-CM | POA: Diagnosis not present

## 2022-01-20 ENCOUNTER — Other Ambulatory Visit: Payer: Self-pay | Admitting: Diagnostic Neuroimaging

## 2022-01-20 DIAGNOSIS — G35 Multiple sclerosis: Secondary | ICD-10-CM

## 2022-01-30 ENCOUNTER — Other Ambulatory Visit: Payer: Self-pay | Admitting: Diagnostic Neuroimaging

## 2022-02-03 DIAGNOSIS — M533 Sacrococcygeal disorders, not elsewhere classified: Secondary | ICD-10-CM | POA: Diagnosis not present

## 2022-02-03 DIAGNOSIS — Z79891 Long term (current) use of opiate analgesic: Secondary | ICD-10-CM | POA: Diagnosis not present

## 2022-02-03 DIAGNOSIS — M5417 Radiculopathy, lumbosacral region: Secondary | ICD-10-CM | POA: Diagnosis not present

## 2022-02-04 DIAGNOSIS — C44622 Squamous cell carcinoma of skin of right upper limb, including shoulder: Secondary | ICD-10-CM | POA: Diagnosis not present

## 2022-02-04 DIAGNOSIS — D485 Neoplasm of uncertain behavior of skin: Secondary | ICD-10-CM | POA: Diagnosis not present

## 2022-02-20 DIAGNOSIS — M5417 Radiculopathy, lumbosacral region: Secondary | ICD-10-CM | POA: Diagnosis not present

## 2022-03-03 DIAGNOSIS — M5417 Radiculopathy, lumbosacral region: Secondary | ICD-10-CM | POA: Diagnosis not present

## 2022-03-03 DIAGNOSIS — M533 Sacrococcygeal disorders, not elsewhere classified: Secondary | ICD-10-CM | POA: Diagnosis not present

## 2022-03-03 DIAGNOSIS — Z79891 Long term (current) use of opiate analgesic: Secondary | ICD-10-CM | POA: Diagnosis not present

## 2022-03-26 ENCOUNTER — Ambulatory Visit: Payer: Medicare Other | Admitting: Neurology

## 2022-03-31 ENCOUNTER — Ambulatory Visit (INDEPENDENT_AMBULATORY_CARE_PROVIDER_SITE_OTHER): Payer: Medicare Other | Admitting: Neurology

## 2022-03-31 ENCOUNTER — Encounter: Payer: Self-pay | Admitting: Neurology

## 2022-03-31 VITALS — BP 128/72 | HR 93 | Ht 61.0 in | Wt 114.5 lb

## 2022-03-31 DIAGNOSIS — G8220 Paraplegia, unspecified: Secondary | ICD-10-CM | POA: Diagnosis not present

## 2022-03-31 DIAGNOSIS — M629 Disorder of muscle, unspecified: Secondary | ICD-10-CM | POA: Diagnosis not present

## 2022-03-31 DIAGNOSIS — G35 Multiple sclerosis: Secondary | ICD-10-CM

## 2022-03-31 DIAGNOSIS — Q782 Osteopetrosis: Secondary | ICD-10-CM | POA: Diagnosis not present

## 2022-03-31 DIAGNOSIS — R269 Unspecified abnormalities of gait and mobility: Secondary | ICD-10-CM

## 2022-03-31 DIAGNOSIS — G8250 Quadriplegia, unspecified: Secondary | ICD-10-CM

## 2022-03-31 MED ORDER — INCOBOTULINUMTOXINA 100 UNITS IM SOLR
500.0000 [IU] | INTRAMUSCULAR | Status: AC
  Administered 2022-03-31: 500 [IU] via INTRAMUSCULAR

## 2022-03-31 NOTE — Progress Notes (Signed)
ASSESSMENT AND PLAN  Amy Bray is a 77 y.o. female   Multiple sclerosis Spastic quadriplegia Electrical stimulation guided xeomin injection, used xeomin 500 units (100 units was dissolved into 2 cc of NS)  Right adductor longus 200 units Right adductor magnus 50 units  Left adductor longus 200 units Left abductor magnus 50 units  Return to clinic in 3 months for repeat injection   DIAGNOSTIC DATA (LABS, IMAGING, TESTING) - I reviewed patient records, labs, notes, testing and imaging myself where available.   MEDICAL HISTORY:  Amy Bray, returned for botulism toxin injection for spastic bilateral lower extremity  She had a history of multiple sclerosis, presented with right leg stiffness, spasm, weakness in 2002, was diagnosed with cervical spine disease, had anterior cervical vasectomy and fusion in 2003. Following the surgery, her right leg weakness and spasms significantly improved, however a year later the symptoms returned, she ultimately had second cervical spine surgery in 2006, following second surgery, she only has mild improvement of her symptoms, she continue has progressive worsening spastic gait abnormality,  She was eventually diagnosed with multiple sclerosis, began to receive Tysabri infusion, there was no significant change taking Tysabri, was eventually switched to Tecfidera since 2022, tolerating it well,  She was receiving EMG guided Botox and toxin injection [facility] with good result, transferred to our clinic since August 2018, was receiving injection every 3 months without significant side effect, last injection was February 08, 2020, when she reported extreme stress at home, moved her mother to assisted living, have to self her mother's house, her husband suffered dementia, living memory unit,  She is also taking baclofen 10 mg 2 tablets 3 times a day as needed, Myrbetriq 25 mg every night, nortriptyline 50 mg every night, oxybutynin XL 5 mg daily,  She  go to swing regularly, continue to be independent, lost her mother and husband at the beginning of this year,  Update December 18, 2021: She was found to injection well, does help her bilateral hip adduction, spasm,  UPDATE March 31 2022: Every 3 months injection has helped her bilateral lower extremity adductor muscle spasm, she can walk better, she continued to swim few times each week  No new multiple sclerosis symptoms, pending follow-up with Dr. Leta Baptist in 2 weeks, on Tecfidera   PHYSICAL EXAM:  Vitals:   03/31/22 1153  BP: 128/72  Pulse: 93   MOTOR: No significant upper extremity weakness, significant spasticity of bilateral lower extremity,  tendency for bilateral hip adduction, barely antigravity movement of bilateral lower extremity proximal and some muscle weakness  GAIT/STANCE: Rely on her walker, spastic, stiff, unsteady gait  REVIEW OF SYSTEMS:  Full 14 system review of systems performed and notable only for as above All other review of systems were negative.   ALLERGIES: No Known Allergies  HOME MEDICATIONS: Current Outpatient Medications  Medication Sig Dispense Refill   acetaminophen (TYLENOL) 500 MG tablet Take 500 mg by mouth as needed.     baclofen (LIORESAL) 10 MG tablet TAKE TWO TABLETS BY MOUTH THREE TIMES DAILY AS NEEDED 540 tablet 1   Cranberry-Vitamin C-Vitamin E 4200-20-3 MG-MG-UNIT CAPS Take by mouth.     Dimethyl Fumarate 240 MG CPDR TAKE 1 CAPSULE ('240MG'$ ) BY MOUTH  TWICE DAILY 60 capsule 5   Ibuprofen (ADVIL PO) Take by mouth in the morning and at bedtime.     lisinopril (PRINIVIL,ZESTRIL) 10 MG tablet Take 1 tablet by mouth daily.     MYRBETRIQ 25 MG TB24 tablet  Take by mouth.     nortriptyline (PAMELOR) 50 MG capsule Take 1 capsule by mouth daily.     oxybutynin (DITROPAN-XL) 5 MG 24 hr tablet Take 5 mg by mouth daily.     traMADol-acetaminophen (ULTRACET) 37.5-325 MG per tablet Take 1 tablet by mouth daily as needed.     Current  Facility-Administered Medications  Medication Dose Route Frequency Provider Last Rate Last Admin   incobotulinumtoxinA (XEOMIN) 100 units injection 500 Units  500 Units Intramuscular Q90 days Marcial Pacas, MD   500 Units at 12/18/21 1530    PAST MEDICAL HISTORY: Past Medical History:  Diagnosis Date   Cervical myelopathy ([place])    Multiple sclerosis ([place])    Osteoporosis 2018   hip    PAST SURGICAL HISTORY: Past Surgical History:  Procedure Laterality Date   CERVICAL FUSION  01/2001, 01/2004   C3-C6    FAMILY HISTORY: Family History  Problem Relation Age of Onset   Transient ischemic attack Mother    Pancreatic cancer Father    Breast cancer Cousin     SOCIAL HISTORY: Social History   Socioeconomic History   Marital status: Married    Spouse name: Stellaluna Guardian   Number of children: 0   Years of education: College   Highest education level: Not on file  Occupational History    Employer: OTHER    Comment: n/a  Tobacco Use   Smoking status: Former    Packs/day: 1.00    Years: 15.00    Total pack years: 15.00    Types: Cigarettes    Quit date: 01/21/1983    Years since quitting: 39.2   Smokeless tobacco: Never  Substance and Sexual Activity   Alcohol use: Yes    Alcohol/week: 0.0 standard drinks of alcohol    Comment: occasionally- wine   Drug use: No   Sexual activity: Not on file  Other Topics Concern   Not on file  Social History Narrative   05/30/19 Patient lives at home alone,  Spouse in SNF for  dementia.   Caffeine Use-none   Social Determinants of Health   Financial Resource Strain: Not on file  Food Insecurity: Not on file  Transportation Needs: Not on file  Physical Activity: Not on file  Stress: Not on file  Social Connections: Not on file  Intimate Partner Violence: Not on file      Marcial Pacas, M.D. Ph.D.  [facility] [address] [place], [place] [address] Ph: [phone] Fax: [phone]  CC:  Kristen Loader, [place],  [place] [address]  Kristen Loader, FNP

## 2022-03-31 NOTE — Progress Notes (Signed)
Xeomin 100 units x 5 vials  LOT #: [id number] EXP: 2025-09 NDC: [id number]  B/B  Witnessed by Candace.

## 2022-04-08 DIAGNOSIS — Z79891 Long term (current) use of opiate analgesic: Secondary | ICD-10-CM | POA: Diagnosis not present

## 2022-04-08 DIAGNOSIS — M5417 Radiculopathy, lumbosacral region: Secondary | ICD-10-CM | POA: Diagnosis not present

## 2022-04-08 DIAGNOSIS — G894 Chronic pain syndrome: Secondary | ICD-10-CM | POA: Diagnosis not present

## 2022-04-08 DIAGNOSIS — M533 Sacrococcygeal disorders, not elsewhere classified: Secondary | ICD-10-CM | POA: Diagnosis not present

## 2022-04-14 ENCOUNTER — Ambulatory Visit (INDEPENDENT_AMBULATORY_CARE_PROVIDER_SITE_OTHER): Payer: Medicare Other | Admitting: Diagnostic Neuroimaging

## 2022-04-14 ENCOUNTER — Encounter: Payer: Self-pay | Admitting: Diagnostic Neuroimaging

## 2022-04-14 VITALS — BP 114/70 | HR 102 | Ht 61.0 in | Wt 108.0 lb

## 2022-04-14 DIAGNOSIS — R269 Unspecified abnormalities of gait and mobility: Secondary | ICD-10-CM

## 2022-04-14 DIAGNOSIS — G35 Multiple sclerosis: Secondary | ICD-10-CM | POA: Diagnosis not present

## 2022-04-14 MED ORDER — BACLOFEN 10 MG PO TABS: ORAL_TABLET | ORAL | 1 refills | Status: DC

## 2022-04-14 NOTE — Progress Notes (Signed)
[facility]  Amy Bray: Amy Bray DOB: 1945-11-02  REFERRING CLINICIAN: Kristen Loader, FNP  HISTORY FROM: Amy Bray REASON FOR VISIT: follow up   HISTORICAL  CHIEF COMPLAINT:  Chief Complaint  Amy Bray presents with   Follow-up    Amy Bray in room #6 and alone. Amy Bray states she is well and stable, no new concerns. Pt has question about a new Rx for MS.    HISTORY OF PRESENT ILLNESS:  UPDATE (04/14/22, VRP): Since last visit, doing well. Symptoms are stable. Severity is moderate. No alleviating or aggravating factors. Tolerating dimethyl fumarate.    UPDATE (04/01/21, VRP): Since last visit, doing about the same. Symptoms are stable. No alleviating or aggravating factors. Tolerating meds. Mother and husband passed away Jan/Jeb 2023.  UPDATE (07/11/20, VRP): Since last visit, doing well! Now on tecfidera generic. More energy and balance. Also improved weigfht gain. Less stress.   UPDATE (03/12/20, VRP): Since last visit, symptoms are continuing to progress.  Her mobility and ADL functioning have declined.  It may take her up to 20 minutes to get out of bed.  It may take her 30 to 40 minutes to get dressed.  She is still living alone, but has medications and groceries delivered to her.  She is having more difficulty preparing meals and reaching up to the microwave.  She is having more problems with incontinence issues.  Her mother is living at an assisted living, husband at memory care center, and Amy Bray does not want to change her location yet.  She is looking at getting some assistance with aids and housekeepers.  She would like to be considered for a power wheelchair or power scooter.  UPDATE (05/30/19, VRP): Since last visit, doing poorly; husband having severe dementia issues causing significant stress. Amy Bray is struggling with gait and weakness.    UPDATE (11/08/18, VRP): Since last visit, doing about the same. MS symptoms are stable. Severity moderate. No  alleviating or aggravating factors. Tolerating meds. Now more caregiver stress, as her husband has progressive dementia.   UPDATE (12/07/17, VRP): Since last visit, now with swelling in feet x 2 months. Improved in last 1-2 weeks. No pain. No triggers. Had 1 day with some addl leg weakness (1 hour) then resolved on 11/29/17. Symptoms are now at baseline. Continues PT and water therapy. Tolerating tysabri.    UPDATE (08/10/17, VRP): Since last visit, doing well. Tolerating tysabri and generic amprya. No alleviating or aggravating factors. Hot flashes improved. Fatigue improved.   UPDATE (02/11/17, VRP): Since last visit, doing well. Tolerating meds. No alleviating or aggravating factors. Now on xeomin (Aug and Dec 2018) and spasticity improved. On generic ampyra and doing well.   UPDATE 08/11/16: Since last visit, doing well. Tolerating tysabri and baclofen. Heat has been challenging with spasms. Gait diff with knees rubbing together, slightly worse.  UPDATE 02/11/16: Since last visit, balance diff continues. No flares. MRIs stable. Tolerating tysabri.   UPDATE 10/08/15: Since last visit, pt feels some progression of gait diff and walking problems. No specific attack or flare up.  UPDATE 07/04/15: Since last visit, doing well. Tolerating tysabri and ampyra.  UPDATE 04/02/15: Since last visit, doing well. Tolerating tysabri and ampyra. Back on nortriptyline (is helping burning pain in feet).   UPDATE 01/03/15: Since last visit, doing better on ampyra (better gait and walking speed). More foot/calf tingling burning pain x 1-2 months. Now with Dr. Andree Elk pain mgmt for back injections x 2, which is helping.  UPDATE 10/02/14: Since last  visit, doing well. Requesting transition of care to [facility] for pain mgmt; previously at [facility] with Dr. Oliva Bustard.  UPDATE 05/22/14: Since last visit, doing well. On tysabri since March 2016. Fatigue and bladder control much improved. Gait is stable.   UPDATE 02/20/14: Since last  visit, had reviewed lab results and now ready to start MS medication. Now would like to start tysabri.  UPDATE 01/09/14: Since last visit, no new events symptoms. LP results reviewed. Didn't get lab work at last visit for MS mimics.   UPDATE 12/07/13: Amy Bray is stable with symptoms. MRI scans reviewed. Continues to have weakness in legs and balance difficulty.  UPDATE 11/21/13: 77 year old right-handed female here for evaluation of lower extremity spasms and weakness. 2002 Amy Bray developed onset of right leg stiffness, spasm, weakness. Amy Bray was diagnosed with cervical spine disease, bone spur ridging, and treated with anterior cervical discectomy and fusion in 2003. Following this her right leg weakness and spasms significantly improved. However one year later her symptoms return. Amy Bray followed up with several surgeons and ultimately had a second cervical spine surgery in 2006. Following this Amy Bray had slight improvement of right leg however within several months her weakness, spasms and gait continued to decline. Amy Bray transitioned needing a cane to walk. 2013 Amy Bray was evaluated by a neurologist in [place], to evaluate her right leg weakness. Apparently Amy Bray had a "multiple sclerosis" workup including MRI of the brain and extensive blood testing. Amy Bray also had EMG nerve conduction studies looking for peroneal neuropathy but apparently all studies were normal. MRI of the brain did show a right medulla lesion which was reported as a stroke. Over the past 1 year Amy Bray has developed new onset left leg weakness, spasm, stiffness. Amy Bray followed up with orthopedic surgery who then referred Amy Bray to me for evaluation. Last MRI of cervical spine was in 2006 at [facility]. No bowel or bladder incontinence. No significant numbness or tingling. No weakness in upper extremities. No vision loss, vision changes, slurred speech or trouble talking.   REVIEW OF SYSTEMS: Full 14 system review of  systems performed and negative except: as per HPI   ALLERGIES: No Known Allergies  HOME MEDICATIONS: Outpatient Medications Prior to Visit  Medication Sig Dispense Refill   acetaminophen (TYLENOL) 500 MG tablet Take 500 mg by mouth as needed.     Cranberry-Vitamin C-Vitamin E 4200-20-3 MG-MG-UNIT CAPS Take by mouth.     Dimethyl Fumarate 240 MG CPDR TAKE 1 CAPSULE (240MG ) BY MOUTH  TWICE DAILY 60 capsule 5   Ibuprofen (ADVIL PO) Take by mouth in the morning and at bedtime.     lisinopril (PRINIVIL,ZESTRIL) 10 MG tablet Take 1 tablet by mouth daily.     MYRBETRIQ 25 MG TB24 tablet Take by mouth.     nortriptyline (PAMELOR) 50 MG capsule Take 1 capsule by mouth daily.     oxybutynin (DITROPAN-XL) 5 MG 24 hr tablet Take 5 mg by mouth daily.     traMADol-acetaminophen (ULTRACET) 37.5-325 MG per tablet Take 1 tablet by mouth daily as needed.     baclofen (LIORESAL) 10 MG tablet TAKE TWO TABLETS BY MOUTH THREE TIMES DAILY AS NEEDED 540 tablet 1   Facility-Administered Medications Prior to Visit  Medication Dose Route Frequency Provider Last Rate Last Admin   incobotulinumtoxinA (XEOMIN) 100 units injection 500 Units  500 Units Intramuscular Q90 days Marcial Pacas, MD   500 Units at 03/31/22 1510    PAST MEDICAL HISTORY: Past Medical History:  Diagnosis Date  Cervical myelopathy (HCC)    Multiple sclerosis ([place])    Osteoporosis 2018   hip    PAST SURGICAL HISTORY: Past Surgical History:  Procedure Laterality Date   CERVICAL FUSION  01/2001, 01/2004   C3-C6    FAMILY HISTORY: Family History  Problem Relation Age of Onset   Transient ischemic attack Mother    Pancreatic cancer Father    Breast cancer Cousin     SOCIAL HISTORY:  Social History   Socioeconomic History   Marital status: Married    Spouse name: Tiphani Villasana   Number of children: 0   Years of education: College   Highest education level: Not on file  Occupational History    Employer: OTHER    Comment: n/a   Tobacco Use   Smoking status: Former    Packs/day: 1.00    Years: 15.00    Additional pack years: 0.00    Total pack years: 15.00    Types: Cigarettes    Quit date: 01/21/1983    Years since quitting: 39.2   Smokeless tobacco: Never  Substance and Sexual Activity   Alcohol use: Yes    Alcohol/week: 0.0 standard drinks of alcohol    Comment: occasionally- wine   Drug use: No   Sexual activity: Not on file  Other Topics Concern   Not on file  Social History Narrative   05/30/19 Amy Bray lives at home alone,  Spouse in SNF for  dementia.   Caffeine Use-none   Social Determinants of Health   Financial Resource Strain: Not on file  Food Insecurity: Not on file  Transportation Needs: Not on file  Physical Activity: Not on file  Stress: Not on file  Social Connections: Not on file  Intimate Partner Violence: Not on file     PHYSICAL EXAM  GENERAL EXAM/CONSTITUTIONAL: Vitals:  Vitals:   04/14/22 1600  BP: 114/70  Pulse: (!) 102  Weight: 108 lb (49 kg)  Height: 5\' 1"  (1.549 m)   Body mass index is 20.41 kg/m. Wt Readings from Last 3 Encounters:  04/14/22 108 lb (49 kg)  03/31/22 114 lb 8 oz (51.9 kg)  12/18/21 108 lb (49 kg)   Amy Bray is in no distress; well developed, nourished and groomed; neck is supple  CARDIOVASCULAR: Examination of carotid arteries is normal; no carotid bruits Regular rate and rhythm, no murmurs Examination of peripheral vascular system by observation and palpation is normal  EYES: Ophthalmoscopic exam of optic discs and posterior segments is normal; no papilledema or hemorrhages No results found.  MUSCULOSKELETAL: Gait, strength, tone, movements noted in Neurologic exam below  NEUROLOGIC: MENTAL STATUS:      No data to display         awake, alert, oriented to person, place and time recent and remote memory intact normal attention and concentration language fluent, comprehension intact, naming intact fund of knowledge  appropriate  CRANIAL NERVE:  2nd - no papilledema on fundoscopic exam 2nd, 3rd, 4th, 6th - pupils equal and reactive to light, visual fields full to confrontation, extraocular muscles intact, no nystagmus 5th - facial sensation symmetric 7th - facial strength symmetric 8th - hearing intact 9th - palate elevates symmetrically, uvula midline 11th - shoulder shrug symmetric 12th - tongue protrusion midline  MOTOR:  BUE 4 BLE INCREASED TONE, 4 PROX AND 3 DISTAL  SENSORY:  normal and symmetric to light touch  COORDINATION:  finger-nose-finger, fine finger movements SLOW  REFLEXES:  deep tendon reflexes BRISK IN BUE AND BLE  GAIT/STATION:  narrow based gait; SHORT STEPS; SLOW, UNSTEADY; WITH WALKER      DIAGNOSTIC DATA (LABS, IMAGING, TESTING) - I reviewed Amy Bray records, labs, notes, testing and imaging myself where available.  Lab Results  Component Value Date   WBC 6.3 07/24/2020   HGB 11.6 07/24/2020   HCT 35.6 07/24/2020   MCV 93 07/24/2020   PLT 319 07/24/2020      Component Value Date/Time   NA 141 07/24/2020 1017   K 4.9 07/24/2020 1017   CL 103 07/24/2020 1017   CO2 24 07/24/2020 1017   GLUCOSE 83 07/24/2020 1017   BUN 24 07/24/2020 1017   CREATININE 0.64 07/24/2020 1017   CALCIUM 9.0 07/24/2020 1017   PROT 6.5 07/24/2020 1017   ALBUMIN 4.7 07/24/2020 1017   AST 20 07/24/2020 1017   ALT 26 07/24/2020 1017   ALKPHOS 50 07/24/2020 1017   BILITOT <0.2 07/24/2020 1017   GFRNONAA 89 05/30/2019 1638   GFRAA 102 05/30/2019 1638   No results found for: "CHOL", "HDL", "LDLCALC", "LDLDIRECT", "TRIG", "CHOLHDL" No results found for: "HGBA1C" No results found for: "VITAMINB12" No results found for: "TSH"  Vit D, 25-Hydroxy  Date Value Ref Range Status  01/03/2015 36.8 30.0 - 100.0 ng/mL Final    Comment:    Vitamin D deficiency has been defined by the Institute of Medicine and an Endocrine Society practice guideline as a level of serum 25-OH vitamin D  less than 20 ng/mL (1,2). The Endocrine Society went on to further define vitamin D insufficiency as a level between 21 and 29 ng/mL (2). 1. IOM (Institute of Medicine). 2010. Dietary reference    intakes for calcium and D. [place]: The    [employer]. 2. Holick MF, Binkley [place], Bischoff-Ferrari HA, et al.    Evaluation, treatment, and prevention of vitamin D    deficiency: an Endocrine Society clinical practice    guideline. JCEM. 2011 Jul; 96(7):1911-30.     I reviewed images myself. In my review, I think there is a 2cm spinal cord lesion (right side, C6-C7) and a smaller left T1 lesion, suspicious for demyelinating disease, on the MRI cervical spine from 10/08/03. No adjacent disc or bone degeneration or spinal stenosis. The MRI report states that the cord appears normal. -VRP  10/08/03 MRI cervical spine (images reviewed) - Satisfactory anterior plate fusion of C4 and C5. Disk degeneration and spondylosis at C3-4 and C5-6 with biforaminal narrowing but no significant central canal stenosis. There is no disk protrusion.    12/29/13 LP - WBC 1, RBC 0, protein 26, glucose 59, OCB > 5, gram stain neg  04/26/17 MRI of the brain with and without contrast [I reviewed images myself and agree with interpretation. 1 small new lesion. -VRP]  1.   Small left occipital subcortical T2/FLAIR hyperintense focus with subtle hyperintensity on diffusion-weighted images.  This focus was not present on the 08/24/2016 MRI.  The etiology is uncertain but it could represent a sub-chronic focus of chronic ischemic or demyelinating change.   2.   There are a couple T2/FLAIR hyperintense foci in the subcortical and deep white matter.  This is a nonspecific finding and could be incidental at this age, will be due to minimal chronic microvascular ischemic change or demyelination.  None of the foci appears to be acute and there is no change when compared to the 08/24/2016 MRI. 3.   Mild generalized cortical  atrophy, typical for age. 4.   There is a normal  enhancement pattern.  04/26/17 MRI of the cervical spine with and without contrast [I reviewed images myself and agree with interpretation. Stable since 2017.  -VRP]  1.   There are 3 small T2/FLAIR hyperintense foci within the spinal cord adjacent to C6, C7 and T2.  All of these were present on the 10/19/2015 MRI.  None of these enhance. 2.   Multilevel degenerative changes as detailed above.   The degenerative changes at C6-C7 have slightly progressed when compared to the previous MRI.  There did not appear to be any nerve root compression. 3.   ACDF from C3 through C6.   4.   There is a normal enhancement pattern of there are no acute findings.  04/26/17 Unremarkable MRI thoracic spine (with and without). [I reviewed images myself and agree with interpretation. -VRP]   05/19/18 MRI cervical spine (with and without) demonstrating: - Small chronic plaques at C6 on the left and C7 on the right; no acute plaques - No new plaques from MRI on 04/26/2017; prior T2 level plaque is not well visualized on current study  05/19/18 MRI thoracic spine (with and without) demonstrating:  - subtle T2 hyperintensity at C7 level on sagittal views; consistent with chronic plaque - no acute plaques; remainder of spinal cord is also unremarkable  12/17/19 MRI brain 1. In comparison to MRI from November 28, 2018, there are a few new small juxtacortical and periventricular T2/FLAIR hyperintensities in bifrontal white matter, which are nonspecific but could relate to demyelination in this Amy Bray with a reported history of multiple sclerosis. 2. No abnormal enhancement or restricted diffusion to suggest active demyelination. 3. Small remote left occipital lobe infarct.  12/17/19 MRI cervical spine 1. Possible focal T2 hyperintensity in the lateral left cord at C5 versus artifact related to adjacent fusion, similar to prior. No convincing new cord signal abnormality.  No cord enhancement to suggest active demyelination. 2. C3-C6 ACDF. Similar adjacent level anterolisthesis of C6 on C7 with uncovering the disc and mild canal stenosis. Limited evaluation of the foramina at this level given motion. Suspected mild to moderate foraminal stenosis at C5-C6.   Anti-JCV antibody 01/10/14 - 0.10 negative  05/22/14 - 0.09 negative 12/2014 - negative March 2017 - negative 10/09/15 - 0.13 negative 02/11/16 JCV antibody 0.11 negative 08-13-16 JCV antibody 0.11 negative sy 03/05/17 JCV negative 0.07 08-15-17 JCV negative, index 0.08    ASSESSMENT AND PLAN  77 y.o. year old female here with  bilateral lower extremity spasticity, weakness,with brisk reflexes in upper and lower extremities, left Hoffman sign, bilateral upgoing toes. MRIs from 2005 and 2015 demonstrate multiple spinal cord lesions. Now suspect demyelinating disease, with first attack in 2002, second attack in 2003, and 3rd attack in 2014.  Then with some progression of symptoms since mid-2017.  Some worsening balance since Jan 2019.    Dx: multiple sclerosis   Relapsing remitting multiple sclerosis (HCC)  Gait disturbance     PLAN:  MULTIPLE SCLEROSIS (RRMS; secondary progression by symptoms) - continue dimethyl fumarate; annual CBC, CMP per PCP (will check today since not checked lately)  INCONTINENCE - myrbetriq; schedule bathroom breaks; continue fiber supplement  MUSCLE SPASMS (stable) - continue baclofen, xeomin  Meds ordered this encounter  Medications   baclofen (LIORESAL) 10 MG tablet    Sig: TAKE TWO TABLETS BY MOUTH THREE TIMES DAILY AS NEEDED    Dispense:  540 tablet    Refill:  1   Return in about 1 year (around 04/14/2023).  Penni Bombard, MD [id number], [id number] PM Certified in Neurology, Neurophysiology and Neuroimaging  [facility] [address], [place], [place] [address] [phone]

## 2022-04-15 LAB — CBC WITH DIFFERENTIAL/PLATELET
Basophils Absolute: 0 x10E3/uL (ref 0.0–0.2)
Basos: 1 %
EOS (ABSOLUTE): 0.2 x10E3/uL (ref 0.0–0.4)
Eos: 4 %
Hematocrit: 36.2 % (ref 34.0–46.6)
Hemoglobin: 12.6 g/dL (ref 11.1–15.9)
Immature Grans (Abs): 0 x10E3/uL (ref 0.0–0.1)
Immature Granulocytes: 1 %
Lymphocytes Absolute: 0.6 x10E3/uL — ABNORMAL LOW (ref 0.7–3.1)
Lymphs: 15 %
MCH: 32.8 pg (ref 26.6–33.0)
MCHC: 34.8 g/dL (ref 31.5–35.7)
MCV: 94 fL (ref 79–97)
Monocytes Absolute: 0.3 x10E3/uL (ref 0.1–0.9)
Monocytes: 8 %
Neutrophils Absolute: 3 x10E3/uL (ref 1.4–7.0)
Neutrophils: 71 %
Platelets: 265 x10E3/uL (ref 150–450)
RBC: 3.84 x10E6/uL (ref 3.77–5.28)
RDW: 11.8 % (ref 11.7–15.4)
WBC: 4.2 x10E3/uL (ref 3.4–10.8)

## 2022-04-15 LAB — COMPREHENSIVE METABOLIC PANEL WITH GFR
ALT: 18 IU/L (ref 0–32)
AST: 17 IU/L (ref 0–40)
Albumin/Globulin Ratio: 2.5 — ABNORMAL HIGH (ref 1.2–2.2)
Albumin: 4.8 g/dL (ref 3.8–4.8)
Alkaline Phosphatase: 43 IU/L — ABNORMAL LOW (ref 44–121)
BUN/Creatinine Ratio: 33 — ABNORMAL HIGH (ref 12–28)
BUN: 24 mg/dL (ref 8–27)
Bilirubin Total: 0.3 mg/dL (ref 0.0–1.2)
CO2: 26 mmol/L (ref 20–29)
Calcium: 10.8 mg/dL — ABNORMAL HIGH (ref 8.7–10.3)
Chloride: 101 mmol/L (ref 96–106)
Creatinine, Ser: 0.73 mg/dL (ref 0.57–1.00)
Globulin, Total: 1.9 g/dL (ref 1.5–4.5)
Glucose: 89 mg/dL (ref 70–99)
Potassium: 4.8 mmol/L (ref 3.5–5.2)
Sodium: 142 mmol/L (ref 134–144)
Total Protein: 6.7 g/dL (ref 6.0–8.5)
eGFR: 86 mL/min/1.73

## 2022-05-07 DIAGNOSIS — M5417 Radiculopathy, lumbosacral region: Secondary | ICD-10-CM | POA: Diagnosis not present

## 2022-05-07 DIAGNOSIS — G894 Chronic pain syndrome: Secondary | ICD-10-CM | POA: Diagnosis not present

## 2022-05-07 DIAGNOSIS — Z79891 Long term (current) use of opiate analgesic: Secondary | ICD-10-CM | POA: Diagnosis not present

## 2022-05-07 DIAGNOSIS — M533 Sacrococcygeal disorders, not elsewhere classified: Secondary | ICD-10-CM | POA: Diagnosis not present

## 2022-05-21 DIAGNOSIS — Z0289 Encounter for other administrative examinations: Secondary | ICD-10-CM

## 2022-05-27 DIAGNOSIS — R351 Nocturia: Secondary | ICD-10-CM | POA: Diagnosis not present

## 2022-05-27 DIAGNOSIS — N3941 Urge incontinence: Secondary | ICD-10-CM | POA: Diagnosis not present

## 2022-05-28 DIAGNOSIS — N3941 Urge incontinence: Secondary | ICD-10-CM | POA: Diagnosis not present

## 2022-06-04 DIAGNOSIS — M533 Sacrococcygeal disorders, not elsewhere classified: Secondary | ICD-10-CM | POA: Diagnosis not present

## 2022-06-04 DIAGNOSIS — M5417 Radiculopathy, lumbosacral region: Secondary | ICD-10-CM | POA: Diagnosis not present

## 2022-06-04 DIAGNOSIS — G894 Chronic pain syndrome: Secondary | ICD-10-CM | POA: Diagnosis not present

## 2022-06-04 DIAGNOSIS — Z79891 Long term (current) use of opiate analgesic: Secondary | ICD-10-CM | POA: Diagnosis not present

## 2022-06-19 DIAGNOSIS — M5417 Radiculopathy, lumbosacral region: Secondary | ICD-10-CM | POA: Diagnosis not present

## 2022-06-20 ENCOUNTER — Other Ambulatory Visit: Payer: Self-pay | Admitting: Diagnostic Neuroimaging

## 2022-06-20 DIAGNOSIS — G35 Multiple sclerosis: Secondary | ICD-10-CM

## 2022-06-23 ENCOUNTER — Other Ambulatory Visit (INDEPENDENT_AMBULATORY_CARE_PROVIDER_SITE_OTHER): Payer: Self-pay

## 2022-06-23 ENCOUNTER — Other Ambulatory Visit: Payer: Self-pay

## 2022-06-23 ENCOUNTER — Other Ambulatory Visit: Payer: Self-pay | Admitting: Neurology

## 2022-06-23 DIAGNOSIS — G35A Relapsing-remitting multiple sclerosis: Secondary | ICD-10-CM

## 2022-06-23 DIAGNOSIS — R269 Unspecified abnormalities of gait and mobility: Secondary | ICD-10-CM | POA: Diagnosis not present

## 2022-06-23 DIAGNOSIS — Z79899 Other long term (current) drug therapy: Secondary | ICD-10-CM

## 2022-06-23 DIAGNOSIS — G8220 Paraplegia, unspecified: Secondary | ICD-10-CM

## 2022-06-23 DIAGNOSIS — Z0289 Encounter for other administrative examinations: Secondary | ICD-10-CM

## 2022-06-23 DIAGNOSIS — G35 Multiple sclerosis: Secondary | ICD-10-CM

## 2022-06-24 LAB — CBC WITH DIFFERENTIAL/PLATELET
Basophils Absolute: 0 10*3/uL (ref 0.0–0.2)
Basos: 1 %
EOS (ABSOLUTE): 0.1 10*3/uL (ref 0.0–0.4)
Eos: 2 %
Hematocrit: 34.6 % (ref 34.0–46.6)
Hemoglobin: 11.3 g/dL (ref 11.1–15.9)
Immature Grans (Abs): 0.1 10*3/uL (ref 0.0–0.1)
Immature Granulocytes: 1 %
Lymphocytes Absolute: 0.8 10*3/uL (ref 0.7–3.1)
Lymphs: 20 %
MCH: 32.1 pg (ref 26.6–33.0)
MCHC: 32.7 g/dL (ref 31.5–35.7)
MCV: 98 fL — ABNORMAL HIGH (ref 79–97)
Monocytes Absolute: 0.8 10*3/uL (ref 0.1–0.9)
Monocytes: 18 %
Neutrophils Absolute: 2.5 10*3/uL (ref 1.4–7.0)
Neutrophils: 58 %
Platelets: 235 10*3/uL (ref 150–450)
RBC: 3.52 x10E6/uL — ABNORMAL LOW (ref 3.77–5.28)
RDW: 11.4 % — ABNORMAL LOW (ref 11.7–15.4)
WBC: 4.2 10*3/uL (ref 3.4–10.8)

## 2022-07-02 DIAGNOSIS — Z0289 Encounter for other administrative examinations: Secondary | ICD-10-CM

## 2022-07-03 DIAGNOSIS — M5417 Radiculopathy, lumbosacral region: Secondary | ICD-10-CM | POA: Diagnosis not present

## 2022-07-03 DIAGNOSIS — M533 Sacrococcygeal disorders, not elsewhere classified: Secondary | ICD-10-CM | POA: Diagnosis not present

## 2022-07-03 DIAGNOSIS — G894 Chronic pain syndrome: Secondary | ICD-10-CM | POA: Diagnosis not present

## 2022-07-03 DIAGNOSIS — Z79891 Long term (current) use of opiate analgesic: Secondary | ICD-10-CM | POA: Diagnosis not present

## 2022-07-09 DIAGNOSIS — Z0000 Encounter for general adult medical examination without abnormal findings: Secondary | ICD-10-CM | POA: Diagnosis not present

## 2022-07-09 DIAGNOSIS — M810 Age-related osteoporosis without current pathological fracture: Secondary | ICD-10-CM | POA: Diagnosis not present

## 2022-07-09 DIAGNOSIS — E782 Mixed hyperlipidemia: Secondary | ICD-10-CM | POA: Diagnosis not present

## 2022-07-09 DIAGNOSIS — I10 Essential (primary) hypertension: Secondary | ICD-10-CM | POA: Diagnosis not present

## 2022-07-09 DIAGNOSIS — G35 Multiple sclerosis: Secondary | ICD-10-CM | POA: Diagnosis not present

## 2022-07-16 ENCOUNTER — Ambulatory Visit
Admission: RE | Admit: 2022-07-16 | Discharge: 2022-07-16 | Disposition: A | Discharge status: Home / Self Care | Payer: Medicare Other | Source: Ambulatory Visit | Attending: Family Medicine | Admitting: Family Medicine

## 2022-07-16 DIAGNOSIS — E349 Endocrine disorder, unspecified: Secondary | ICD-10-CM | POA: Diagnosis not present

## 2022-07-16 DIAGNOSIS — M810 Age-related osteoporosis without current pathological fracture: Secondary | ICD-10-CM

## 2022-07-16 DIAGNOSIS — Z90722 Acquired absence of ovaries, bilateral: Secondary | ICD-10-CM | POA: Diagnosis not present

## 2022-07-16 DIAGNOSIS — N958 Other specified menopausal and perimenopausal disorders: Secondary | ICD-10-CM | POA: Diagnosis not present

## 2022-07-17 ENCOUNTER — Encounter: Payer: Self-pay | Admitting: Anesthesiology

## 2022-07-21 ENCOUNTER — Ambulatory Visit (INDEPENDENT_AMBULATORY_CARE_PROVIDER_SITE_OTHER): Payer: Medicare Other | Admitting: Neurology

## 2022-07-21 ENCOUNTER — Encounter: Payer: Self-pay | Admitting: Neurology

## 2022-07-21 VITALS — BP 110/70 | Ht 61.0 in | Wt 112.0 lb

## 2022-07-21 DIAGNOSIS — R269 Unspecified abnormalities of gait and mobility: Secondary | ICD-10-CM

## 2022-07-21 DIAGNOSIS — G8220 Paraplegia, unspecified: Secondary | ICD-10-CM

## 2022-07-21 DIAGNOSIS — Z79899 Other long term (current) drug therapy: Secondary | ICD-10-CM

## 2022-07-21 DIAGNOSIS — M629 Disorder of muscle, unspecified: Secondary | ICD-10-CM

## 2022-07-21 DIAGNOSIS — Q782 Osteopetrosis: Secondary | ICD-10-CM

## 2022-07-21 DIAGNOSIS — G8250 Quadriplegia, unspecified: Secondary | ICD-10-CM | POA: Diagnosis not present

## 2022-07-21 DIAGNOSIS — G35 Multiple sclerosis: Secondary | ICD-10-CM | POA: Diagnosis not present

## 2022-07-21 MED ORDER — INCOBOTULINUMTOXINA 100 UNITS IM SOLR
500.0000 [IU] | INTRAMUSCULAR | Status: AC
  Administered 2022-07-23: 500 [IU] via INTRAMUSCULAR

## 2022-07-21 NOTE — Progress Notes (Signed)
xenomin 100units x 5 vial  [id number] [id number] Exp-06-2024 B/B  Bacteriostatic 0.9% Sodium Chloride- 10mL  WUJ:[id number] Expiration: 04/21/23 NDC: 7829-562-13 Dx: m62.9 WITNESSED YQ:[id number] martin rn

## 2022-07-23 DIAGNOSIS — G8220 Paraplegia, unspecified: Secondary | ICD-10-CM | POA: Diagnosis not present

## 2022-07-23 DIAGNOSIS — M629 Disorder of muscle, unspecified: Secondary | ICD-10-CM | POA: Diagnosis not present

## 2022-07-23 DIAGNOSIS — Z79899 Other long term (current) drug therapy: Secondary | ICD-10-CM | POA: Diagnosis not present

## 2022-07-23 DIAGNOSIS — R269 Unspecified abnormalities of gait and mobility: Secondary | ICD-10-CM | POA: Diagnosis not present

## 2022-07-23 DIAGNOSIS — G8250 Quadriplegia, unspecified: Secondary | ICD-10-CM

## 2022-07-23 DIAGNOSIS — G35 Multiple sclerosis: Secondary | ICD-10-CM | POA: Diagnosis not present

## 2022-07-23 DIAGNOSIS — Q782 Osteopetrosis: Secondary | ICD-10-CM | POA: Diagnosis not present

## 2022-07-23 NOTE — Progress Notes (Signed)
ASSESSMENT AND PLAN  Amy Bray is a 77 y.o. female   Multiple sclerosis Spastic quadriplegia Electrical stimulation guided xeomin injection, used xeomin 500 units (100 units was dissolved into 2 cc of NS)  Right adductor longus 150 units Right adductor magnus 50 units  Left adductor longus 200 units Left abductor magnus 100 units  Return to clinic in 3 months for repeat injection   DIAGNOSTIC DATA (LABS, IMAGING, TESTING) - I reviewed patient records, labs, notes, testing and imaging myself where available.   MEDICAL HISTORY:  Amy Bray, returned for botulism toxin injection for spastic bilateral lower extremity  She had a history of multiple sclerosis, presented with right leg stiffness, spasm, weakness in 2002, was diagnosed with cervical spine disease, had anterior cervical vasectomy and fusion in 2003. Following the surgery, her right leg weakness and spasms significantly improved, however a year later the symptoms returned, she ultimately had second cervical spine surgery in 2006, following second surgery, she only has mild improvement of her symptoms, she continue has progressive worsening spastic gait abnormality,  She was eventually diagnosed with multiple sclerosis, began to receive Tysabri infusion, there was no significant change taking Tysabri, was eventually switched to Tecfidera since 2022, tolerating it well,  She was receiving EMG guided Botox and toxin injection [facility] with good result, transferred to our clinic since August 2018, was receiving injection every 3 months without significant side effect, last injection was February 08, 2020, when she reported extreme stress at home, moved her mother to assisted living, have to self her mother's house, her husband suffered dementia, living memory unit,  She is also taking baclofen 10 mg 2 tablets 3 times a day as needed, Myrbetriq 25 mg every night, nortriptyline 50 mg every night, oxybutynin XL 5 mg  daily,  She go to swing regularly, continue to be independent, lost her mother and husband at the beginning of this year,  Update December 18, 2021: She was found to injection well, does help her bilateral hip adduction, spasm,  UPDATE March 31 2022: Every 3 months injection has helped her bilateral lower extremity adductor muscle spasm, she can walk better, she continued to swim few times each week  No new multiple sclerosis symptoms, pending follow-up with Dr. Marjory Lies in 2 weeks, on Tecfidera  UPDATE July 23 2022: She responded well to previous injection, no significant side effect noted, tendency for bilateral lower extremity adduction, left worse than right,  PHYSICAL EXAM:  Vitals:   07/21/22 1123  BP: 110/70   MOTOR: No significant upper extremity weakness, significant spasticity of bilateral lower extremity,  tendency for bilateral hip adduction, barely antigravity movement of bilateral lower extremity proximal and some muscle weakness  GAIT/STANCE: Rely on her walker, spastic, stiff, unsteady gait  REVIEW OF SYSTEMS:  Full 14 system review of systems performed and notable only for as above All other review of systems were negative.   ALLERGIES: No Known Allergies  HOME MEDICATIONS: Current Outpatient Medications  Medication Sig Dispense Refill   acetaminophen (TYLENOL) 500 MG tablet Take 500 mg by mouth as needed.     baclofen (LIORESAL) 10 MG tablet TAKE TWO TABLETS BY MOUTH THREE TIMES DAILY AS NEEDED 540 tablet 1   Cranberry-Vitamin C-Vitamin E 4200-20-3 MG-MG-UNIT CAPS Take by mouth.     Dimethyl Fumarate 240 MG CPDR TAKE 1 CAPSULE (240MG ) BY MOUTH  TWICE DAILY 60 capsule 5   Ibuprofen (ADVIL PO) Take by mouth in the morning and at bedtime.  lisinopril (PRINIVIL,ZESTRIL) 10 MG tablet Take 1 tablet by mouth daily.     MYRBETRIQ 25 MG TB24 tablet Take by mouth.     nortriptyline (PAMELOR) 50 MG capsule Take 1 capsule by mouth daily.     oxybutynin  (DITROPAN-XL) 5 MG 24 hr tablet Take 5 mg by mouth daily.     traMADol-acetaminophen (ULTRACET) 37.5-325 MG per tablet Take 1 tablet by mouth daily as needed.     Current Facility-Administered Medications  Medication Dose Route Frequency Provider Last Rate Last Admin   incobotulinumtoxinA (XEOMIN) 100 units injection 500 Units  500 Units Intramuscular Q90 days Levert Feinstein, MD   500 Units at 03/31/22 1510   incobotulinumtoxinA (XEOMIN) 100 units injection 500 Units  500 Units Intramuscular Q90 days Levert Feinstein, MD        PAST MEDICAL HISTORY: Past Medical History:  Diagnosis Date   Cervical myelopathy (HCC)    Multiple sclerosis (HCC)    Osteoporosis 2018   hip    PAST SURGICAL HISTORY: Past Surgical History:  Procedure Laterality Date   CERVICAL FUSION  01/2001, 01/2004   C3-C6    FAMILY HISTORY: Family History  Problem Relation Age of Onset   Transient ischemic attack Mother    Pancreatic cancer Father    Breast cancer Cousin     SOCIAL HISTORY: Social History   Socioeconomic History   Marital status: Married    Spouse name: Luevina Montani   Number of children: 0   Years of education: College   Highest education level: Not on file  Occupational History    Employer: OTHER    Comment: n/a  Tobacco Use   Smoking status: Former    Packs/day: 1.00    Years: 15.00    Additional pack years: 0.00    Total pack years: 15.00    Types: Cigarettes    Quit date: 01/21/1983    Years since quitting: 39.5   Smokeless tobacco: Never  Substance and Sexual Activity   Alcohol use: Yes    Alcohol/week: 0.0 standard drinks of alcohol    Comment: occasionally- wine   Drug use: No   Sexual activity: Not on file  Other Topics Concern   Not on file  Social History Narrative   05/30/19 Patient lives at home alone,  Spouse in SNF for  dementia.   Caffeine Use-none   Social Determinants of Health   Financial Resource Strain: Not on file  Food Insecurity: Not on file  Transportation  Needs: Not on file  Physical Activity: Not on file  Stress: Not on file  Social Connections: Not on file  Intimate Partner Violence: Not on file      Levert Feinstein, M.D. Ph.D.  [facility] [address] [place], [place] [address] Ph: [phone] Fax: [phone]  CC:  Soundra Pilon, FNP [address] [place],  [place] [address]  Soundra Pilon, FNP

## 2022-08-04 DIAGNOSIS — G35 Multiple sclerosis: Secondary | ICD-10-CM | POA: Diagnosis not present

## 2022-08-06 ENCOUNTER — Ambulatory Visit: Payer: Medicare Other | Admitting: Neurology

## 2022-08-22 ENCOUNTER — Encounter: Payer: Self-pay | Admitting: *Deleted

## 2022-09-02 DIAGNOSIS — M5417 Radiculopathy, lumbosacral region: Secondary | ICD-10-CM | POA: Diagnosis not present

## 2022-09-02 DIAGNOSIS — Z79891 Long term (current) use of opiate analgesic: Secondary | ICD-10-CM | POA: Diagnosis not present

## 2022-09-02 DIAGNOSIS — G894 Chronic pain syndrome: Secondary | ICD-10-CM | POA: Diagnosis not present

## 2022-09-02 DIAGNOSIS — M533 Sacrococcygeal disorders, not elsewhere classified: Secondary | ICD-10-CM | POA: Diagnosis not present

## 2022-09-08 DIAGNOSIS — R8279 Other abnormal findings on microbiological examination of urine: Secondary | ICD-10-CM | POA: Diagnosis not present

## 2022-09-08 DIAGNOSIS — N311 Reflex neuropathic bladder, not elsewhere classified: Secondary | ICD-10-CM | POA: Diagnosis not present

## 2022-09-08 DIAGNOSIS — N3941 Urge incontinence: Secondary | ICD-10-CM | POA: Diagnosis not present

## 2022-10-07 DIAGNOSIS — M5417 Radiculopathy, lumbosacral region: Secondary | ICD-10-CM | POA: Diagnosis not present

## 2022-10-07 DIAGNOSIS — M533 Sacrococcygeal disorders, not elsewhere classified: Secondary | ICD-10-CM | POA: Diagnosis not present

## 2022-10-07 DIAGNOSIS — Z79891 Long term (current) use of opiate analgesic: Secondary | ICD-10-CM | POA: Diagnosis not present

## 2022-10-07 DIAGNOSIS — G894 Chronic pain syndrome: Secondary | ICD-10-CM | POA: Diagnosis not present

## 2022-10-22 ENCOUNTER — Ambulatory Visit: Payer: Medicare Other | Admitting: Neurology

## 2022-10-23 DIAGNOSIS — L820 Inflamed seborrheic keratosis: Secondary | ICD-10-CM | POA: Diagnosis not present

## 2022-10-29 ENCOUNTER — Ambulatory Visit (INDEPENDENT_AMBULATORY_CARE_PROVIDER_SITE_OTHER): Payer: Medicare Other | Admitting: Neurology

## 2022-10-29 VITALS — BP 117/74 | HR 90

## 2022-10-29 DIAGNOSIS — G35 Multiple sclerosis: Secondary | ICD-10-CM

## 2022-10-29 DIAGNOSIS — G8220 Paraplegia, unspecified: Secondary | ICD-10-CM

## 2022-10-29 MED ORDER — INCOBOTULINUMTOXINA 100 UNITS IM SOLR
500.0000 [IU] | Freq: Once | INTRAMUSCULAR | Status: AC
  Administered 2022-10-29: 500 [IU] via INTRAMUSCULAR

## 2022-10-29 NOTE — Progress Notes (Signed)
xenomin 100units x 5 vial  Ndc-[phone] Exp-10.2026 B/B Bacteriostatic 0.9% Sodium Chloride- 10mL  QMV:[id number] Expiration: 04.01.2025 NDC: [id number] Dx: g35 WITNESSED MW:[id number], CMA

## 2022-10-29 NOTE — Progress Notes (Signed)
ASSESSMENT AND PLAN  Amy Bray is a 77 y.o. female   Multiple sclerosis Spastic quadriplegia Electrical stimulation guided xeomin injection, used xeomin 500 units (100 units was dissolved into 2 cc of NS)  Right adductor longus 200 units Right adductor magnus 50 units  Left adductor longus 200 units Left abductor magnus 50 units  Return to clinic in 3 months for repeat injection   DIAGNOSTIC DATA (LABS, IMAGING, TESTING) - I reviewed patient records, labs, notes, testing and imaging myself where available.   MEDICAL HISTORY:  Amy Bray, returned for botulism toxin injection for spastic bilateral lower extremity  She had a history of multiple sclerosis, presented with right leg stiffness, spasm, weakness in 2002, was diagnosed with cervical spine disease, had anterior cervical vasectomy and fusion in 2003. Following the surgery, her right leg weakness and spasms significantly improved, however a year later the symptoms returned, she ultimately had second cervical spine surgery in 2006, following second surgery, she only has mild improvement of her symptoms, she continue has progressive worsening spastic gait abnormality,  She was eventually diagnosed with multiple sclerosis, began to receive Tysabri infusion, there was no significant change taking Tysabri, was eventually switched to Tecfidera since 2022, tolerating it well,  She was receiving EMG guided Botox and toxin injection [facility] with good result, transferred to our clinic since August 2018, was receiving injection every 3 months without significant side effect, last injection was February 08, 2020, when she reported extreme stress at home, moved her mother to assisted living, have to self her mother's house, her husband suffered dementia, living memory unit,  She is also taking baclofen 10 mg 2 tablets 3 times a day as needed, Myrbetriq 25 mg every night, nortriptyline 50 mg every night, oxybutynin XL 5 mg daily,  She  go to swing regularly, continue to be independent, lost her mother and husband at the beginning of this year,  Update December 18, 2021: She was found to injection well, does help her bilateral hip adduction, spasm,  UPDATE March 31 2022: Every 3 months injection has helped her bilateral lower extremity adductor muscle spasm, she can walk better, she continued to swim few times each week  No new multiple sclerosis symptoms, pending follow-up with Dr. Marjory Lies in 2 weeks, on Tecfidera  UPDATE July 23 2022: She responded well to previous injection, no significant side effect noted, tendency for bilateral lower extremity adduction, left worse than right,  UPDATE Oct 29 2022: She is doing very well, continue water aerobic, the benefit of Botox lasting for 2 months, then noticed gradual worsening lower extremity spastic adduction  PHYSICAL EXAM:  Vitals:   10/29/22 1048  BP: 117/74  Pulse: 90   MOTOR: No significant upper extremity weakness, significant spasticity of bilateral lower extremity,  tendency for bilateral hip adduction, barely antigravity movement of bilateral lower extremity proximal and some muscle weakness  GAIT/STANCE: Rely on her walker, spastic, stiff, unsteady gait  REVIEW OF SYSTEMS:  Full 14 system review of systems performed and notable only for as above All other review of systems were negative.   ALLERGIES: No Known Allergies  HOME MEDICATIONS: Current Outpatient Medications  Medication Sig Dispense Refill   acetaminophen (TYLENOL) 500 MG tablet Take 500 mg by mouth as needed.     baclofen (LIORESAL) 10 MG tablet TAKE TWO TABLETS BY MOUTH THREE TIMES DAILY AS NEEDED 540 tablet 1   Cranberry-Vitamin C-Vitamin E 4200-20-3 MG-MG-UNIT CAPS Take by mouth.     Dimethyl  Fumarate 240 MG CPDR TAKE 1 CAPSULE (240MG ) BY MOUTH  TWICE DAILY 60 capsule 5   Ibuprofen (ADVIL PO) Take by mouth in the morning and at bedtime.     lisinopril (PRINIVIL,ZESTRIL) 10 MG tablet  Take 1 tablet by mouth daily.     MYRBETRIQ 25 MG TB24 tablet Take by mouth.     nortriptyline (PAMELOR) 50 MG capsule Take 1 capsule by mouth daily.     oxybutynin (DITROPAN-XL) 5 MG 24 hr tablet Take 5 mg by mouth daily.     traMADol-acetaminophen (ULTRACET) 37.5-325 MG per tablet Take 1 tablet by mouth daily as needed.     Current Facility-Administered Medications  Medication Dose Route Frequency Provider Last Rate Last Admin   incobotulinumtoxinA (XEOMIN) 100 units injection 500 Units  500 Units Intramuscular Q90 days Levert Feinstein, MD   500 Units at 03/31/22 1510   incobotulinumtoxinA (XEOMIN) 100 units injection 500 Units  500 Units Intramuscular Q90 days Levert Feinstein, MD   500 Units at 07/23/22 1228   incobotulinumtoxinA (XEOMIN) 100 units injection 500 Units  500 Units Intramuscular Once         PAST MEDICAL HISTORY: Past Medical History:  Diagnosis Date   Cervical myelopathy (HCC)    Multiple sclerosis (HCC)    Osteoporosis 2018   hip    PAST SURGICAL HISTORY: Past Surgical History:  Procedure Laterality Date   CERVICAL FUSION  01/2001, 01/2004   C3-C6    FAMILY HISTORY: Family History  Problem Relation Age of Onset   Transient ischemic attack Mother    Pancreatic cancer Father    Breast cancer Cousin     SOCIAL HISTORY: Social History   Socioeconomic History   Marital status: Married    Spouse name: [name]   Number of children: 0   Years of education: College   Highest education level: Not on file  Occupational History    Employer: OTHER    Comment: n/a  Tobacco Use   Smoking status: Former    Current packs/day: 0.00    Average packs/day: 1 pack/day for 15.0 years (15.0 ttl pk-yrs)    Types: Cigarettes    Start date: 01/21/1968    Quit date: 01/21/1983    Years since quitting: 39.7   Smokeless tobacco: Never  Substance and Sexual Activity   Alcohol use: Yes    Alcohol/week: 0.0 standard drinks of alcohol    Comment: occasionally- wine   Drug use: No    Sexual activity: Not on file  Other Topics Concern   Not on file  Social History Narrative   05/30/19 Patient lives at home alone,  Spouse in SNF for  dementia.   Caffeine Use-none   Social Determinants of Health   Financial Resource Strain: Not on file  Food Insecurity: Not on file  Transportation Needs: Not on file  Physical Activity: Not on file  Stress: Not on file  Social Connections: Not on file  Intimate Partner Violence: Not on file      Levert Feinstein, M.D. Ph.D.  [facility] [address] [place], [place] [address] Ph: [phone] Fax: [phone]  CC:  Soundra Pilon, FNP [address] [place],  [place] [address]  Soundra Pilon, FNP

## 2022-11-03 ENCOUNTER — Other Ambulatory Visit: Payer: Self-pay

## 2022-11-03 ENCOUNTER — Telehealth: Payer: Self-pay | Admitting: Diagnostic Neuroimaging

## 2022-11-03 DIAGNOSIS — M5417 Radiculopathy, lumbosacral region: Secondary | ICD-10-CM | POA: Diagnosis not present

## 2022-11-03 MED ORDER — BACLOFEN 10 MG PO TABS: ORAL_TABLET | ORAL | 1 refills | Status: DC

## 2022-11-03 NOTE — Telephone Encounter (Signed)
Pt is requesting a refill for baclofen (LIORESAL) 10 MG tablet.  Pharmacy: [facility]

## 2022-11-10 DIAGNOSIS — Z23 Encounter for immunization: Secondary | ICD-10-CM | POA: Diagnosis not present

## 2022-11-28 ENCOUNTER — Other Ambulatory Visit: Payer: Self-pay | Admitting: Family Medicine

## 2022-11-28 DIAGNOSIS — Z1231 Encounter for screening mammogram for malignant neoplasm of breast: Secondary | ICD-10-CM

## 2022-12-03 DIAGNOSIS — G894 Chronic pain syndrome: Secondary | ICD-10-CM | POA: Diagnosis not present

## 2022-12-03 DIAGNOSIS — Z79891 Long term (current) use of opiate analgesic: Secondary | ICD-10-CM | POA: Diagnosis not present

## 2022-12-03 DIAGNOSIS — M533 Sacrococcygeal disorders, not elsewhere classified: Secondary | ICD-10-CM | POA: Diagnosis not present

## 2022-12-03 DIAGNOSIS — M5417 Radiculopathy, lumbosacral region: Secondary | ICD-10-CM | POA: Diagnosis not present

## 2022-12-26 ENCOUNTER — Other Ambulatory Visit: Payer: Self-pay | Admitting: Diagnostic Neuroimaging

## 2022-12-26 DIAGNOSIS — G35 Multiple sclerosis: Secondary | ICD-10-CM

## 2023-01-08 DIAGNOSIS — M810 Age-related osteoporosis without current pathological fracture: Secondary | ICD-10-CM | POA: Diagnosis not present

## 2023-01-08 DIAGNOSIS — E782 Mixed hyperlipidemia: Secondary | ICD-10-CM | POA: Diagnosis not present

## 2023-01-08 DIAGNOSIS — I10 Essential (primary) hypertension: Secondary | ICD-10-CM | POA: Diagnosis not present

## 2023-01-16 ENCOUNTER — Ambulatory Visit
Admission: RE | Admit: 2023-01-16 | Discharge: 2023-01-16 | Disposition: A | Discharge status: Home / Self Care | Payer: Medicare Other | Source: Ambulatory Visit | Attending: Family Medicine | Admitting: Family Medicine

## 2023-01-16 DIAGNOSIS — Z1231 Encounter for screening mammogram for malignant neoplasm of breast: Secondary | ICD-10-CM

## 2023-02-04 ENCOUNTER — Ambulatory Visit (INDEPENDENT_AMBULATORY_CARE_PROVIDER_SITE_OTHER): Payer: Medicare Other | Admitting: Neurology

## 2023-02-04 VITALS — BP 115/68

## 2023-02-04 DIAGNOSIS — G8220 Paraplegia, unspecified: Secondary | ICD-10-CM

## 2023-02-04 DIAGNOSIS — G35 Multiple sclerosis: Secondary | ICD-10-CM

## 2023-02-04 MED ORDER — INCOBOTULINUMTOXINA 100 UNITS IM SOLR
500.0000 [IU] | INTRAMUSCULAR | Status: AC
  Administered 2023-02-04: 500 [IU] via INTRAMUSCULAR

## 2023-02-04 NOTE — Progress Notes (Signed)
 xeomin  100units x 5 vial  [id number] Exp-2027-01 xeomin  100units x 5 vial  [id number] Exp-2026/12 B/B Bacteriostatic 0.9% Sodium Chloride- 12mL  WUJ:[id number] Expiration: 11/21/23 NDC: [id number] Dx:   Q65.78   WITNESSED IO:[id number]

## 2023-02-04 NOTE — Progress Notes (Signed)
 ASSESSMENT AND PLAN  Amy Bray is a 78 y.o. female   Multiple sclerosis Spastic quadriplegia Electrical stimulation guided xeomin  injection, used xeomin  500 units (100 units was dissolved into 2 cc of NS)  Right adductor longus 200 units Right adductor magnus 50 units  Left adductor longus 200 units Left abductor magnus 50 units  Return to clinic in 3 months for repeat injection   DIAGNOSTIC DATA (LABS, IMAGING, TESTING) - I reviewed patient records, labs, notes, testing and imaging myself where available.   MEDICAL HISTORY:  Amy Bray, returned for botulism toxin injection for spastic bilateral lower extremity  She had a history of multiple sclerosis, presented with right leg stiffness, spasm, weakness in 2002, was diagnosed with cervical spine disease, had anterior cervical vasectomy and fusion in 2003. Following the surgery, her right leg weakness and spasms significantly improved, however a year later the symptoms returned, she ultimately had second cervical spine surgery in 2006, following second surgery, she only has mild improvement of her symptoms, she continue has progressive worsening spastic gait abnormality,  She was eventually diagnosed with multiple sclerosis, began to receive Tysabri  infusion, there was no significant change taking Tysabri , was eventually switched to Tecfidera  since 2022, tolerating it well,  She was receiving EMG guided Botox and toxin injection [facility] with good result, transferred to our clinic since August 2018, was receiving injection every 3 months without significant side effect, last injection was February 08, 2020, when she reported extreme stress at home, moved her mother to assisted living, have to self her mother's house, her husband suffered dementia, living memory unit,  She is also taking baclofen  10 mg 2 tablets 3 times a day as needed, Myrbetriq 25 mg every night, nortriptyline 50 mg every night, oxybutynin XL 5 mg daily,  She  go to swing regularly, continue to be independent, lost her mother and husband at the beginning of this year,  Update December 18, 2021: She was found to injection well, does help her bilateral hip adduction, spasm,  UPDATE March 31 2022: Every 3 months injection has helped her bilateral lower extremity adductor muscle spasm, she can walk better, she continued to swim few times each week  No new multiple sclerosis symptoms, pending follow-up with Dr. Salli Crawley in 2 weeks, on Tecfidera   UPDATE July 23 2022: She responded well to previous injection, no significant side effect noted, tendency for bilateral lower extremity adduction, left worse than right,  UPDATE Oct 29 2022: She is doing very well, continue water aerobic, the benefit of Botox lasting for 2 months, then noticed gradual worsening lower extremity spastic adduction  UPDATE Jan 15th 2025: She does think every 3 months xeomin  injection helped her lower extremity adduction, spasticity, continue to swim regularly  PHYSICAL EXAM:  Vitals:   02/04/23 1048  BP: 115/68   MOTOR: No significant upper extremity weakness, significant spasticity of bilateral lower extremity,  tendency for bilateral hip adduction, barely antigravity movement of bilateral lower extremity proximal and some muscle weakness  GAIT/STANCE: Rely on her walker, spastic, stiff, unsteady gait  REVIEW OF SYSTEMS:  Full 14 system review of systems performed and notable only for as above All other review of systems were negative.   ALLERGIES: No Known Allergies  HOME MEDICATIONS: Current Outpatient Medications  Medication Sig Dispense Refill   acetaminophen (TYLENOL) 500 MG tablet Take 500 mg by mouth as needed.     baclofen  (LIORESAL ) 10 MG tablet TAKE TWO TABLETS BY MOUTH THREE TIMES DAILY AS  NEEDED 540 tablet 1   Cranberry-Vitamin C-Vitamin E 4200-20-3 MG-MG-UNIT CAPS Take by mouth.     Dimethyl Fumarate  240 MG CPDR TAKE 1 CAPSULE BY MOUTH TWICE  DAILY 60  capsule 5   Ibuprofen (ADVIL PO) Take by mouth in the morning and at bedtime.     lisinopril (PRINIVIL,ZESTRIL) 10 MG tablet Take 1 tablet by mouth daily.     MYRBETRIQ 25 MG TB24 tablet Take by mouth.     nortriptyline (PAMELOR) 50 MG capsule Take 1 capsule by mouth daily.     oxybutynin (DITROPAN-XL) 5 MG 24 hr tablet Take 5 mg by mouth daily.     traMADol-acetaminophen (ULTRACET) 37.5-325 MG per tablet Take 1 tablet by mouth daily as needed.     Current Facility-Administered Medications  Medication Dose Route Frequency Provider Last Rate Last Admin   incobotulinumtoxinA  (XEOMIN ) 100 units injection 500 Units  500 Units Intramuscular Q90 days Phebe Brasil, MD   500 Units at 03/31/22 1510   incobotulinumtoxinA  (XEOMIN ) 100 units injection 500 Units  500 Units Intramuscular Q90 days Phebe Brasil, MD   500 Units at 07/23/22 1228   incobotulinumtoxinA  (XEOMIN ) 100 units injection 500 Units  500 Units Intramuscular Q90 days Phebe Brasil, MD        PAST MEDICAL HISTORY: Past Medical History:  Diagnosis Date   Cervical myelopathy (HCC)    Multiple sclerosis (HCC)    Osteoporosis 2018   hip    PAST SURGICAL HISTORY: Past Surgical History:  Procedure Laterality Date   CERVICAL FUSION  01/2001, 01/2004   C3-C6    FAMILY HISTORY: Family History  Problem Relation Age of Onset   Transient ischemic attack Mother    Pancreatic cancer Father    Breast cancer Cousin     SOCIAL HISTORY: Social History   Socioeconomic History   Marital status: Married    Spouse name: Jisele Lerer   Number of children: 0   Years of education: College   Highest education level: Not on file  Occupational History    Employer: OTHER    Comment: n/a  Tobacco Use   Smoking status: Former    Current packs/day: 0.00    Average packs/day: 1 pack/day for 15.0 years (15.0 ttl pk-yrs)    Types: Cigarettes    Start date: 01/21/1968    Quit date: 01/21/1983    Years since quitting: 40.0   Smokeless tobacco: Never   Substance and Sexual Activity   Alcohol use: Yes    Alcohol/week: 0.0 standard drinks of alcohol    Comment: occasionally- wine   Drug use: No   Sexual activity: Not on file  Other Topics Concern   Not on file  Social History Narrative   05/30/19 Patient lives at home alone,  Spouse in SNF for  dementia.   Caffeine Use-none   Social Drivers of Corporate investment banker Strain: Not on file  Food Insecurity: Not on file  Transportation Needs: Not on file  Physical Activity: Not on file  Stress: Not on file  Social Connections: Not on file  Intimate Partner Violence: Not on file      Phebe Brasil, M.D. Ph.D.  [facility] [address] [place], [place] [address] Ph: [phone] Fax: [phone]  CC:  Alejandro Hurt, FNP [address] [place],  [place] [address]  Alejandro Hurt, FNP

## 2023-04-14 ENCOUNTER — Encounter: Payer: Self-pay | Admitting: Diagnostic Neuroimaging

## 2023-04-14 ENCOUNTER — Ambulatory Visit (INDEPENDENT_AMBULATORY_CARE_PROVIDER_SITE_OTHER): Payer: Medicare Other | Admitting: Diagnostic Neuroimaging

## 2023-04-14 VITALS — BP 99/66 | HR 108 | Wt 105.0 lb

## 2023-04-14 DIAGNOSIS — G8220 Paraplegia, unspecified: Secondary | ICD-10-CM | POA: Diagnosis not present

## 2023-04-14 DIAGNOSIS — G35 Multiple sclerosis: Secondary | ICD-10-CM

## 2023-04-14 MED ORDER — BACLOFEN 10 MG PO TABS: ORAL_TABLET | ORAL | 4 refills | Status: AC

## 2023-04-14 NOTE — Progress Notes (Signed)
 [facility]  PATIENT: Amy Bray DOB: [birth year]  REFERRING CLINICIAN: Soundra Pilon, FNP  HISTORY FROM: patient REASON FOR VISIT: follow up   HISTORICAL  CHIEF COMPLAINT:  Chief Complaint  Patient presents with   Follow-up    Pt alone in room 7. Denies any issues or new problems. States everything is "about the same".     HISTORY OF PRESENT ILLNESS:  UPDATE (04/14/23, VRP): Since last visit, doing well. Symptoms are stable. No falls. Tolerating meds. Going to gym and pool exercises.  UPDATE (04/14/22, VRP): Since last visit, doing well. Symptoms are stable. Severity is moderate. No alleviating or aggravating factors. Tolerating dimethyl fumarate.    UPDATE (04/01/21, VRP): Since last visit, doing about the same. Symptoms are stable. No alleviating or aggravating factors. Tolerating meds. Mother and husband passed away Jan/Jeb 05-18-21.  UPDATE (07/11/20, VRP): Since last visit, doing well! Now on tecfidera generic. More energy and balance. Also improved weight gain. Less stress.   UPDATE (03/12/20, VRP): Since last visit, symptoms are continuing to progress.  Her mobility and ADL functioning have declined.  It may take her up to 20 minutes to get out of bed.  It may take her 30 to 40 minutes to get dressed.  She is still living alone, but has medications and groceries delivered to her.  She is having more difficulty preparing meals and reaching up to the microwave.  She is having more problems with incontinence issues.  Her mother is living at an assisted living, husband at memory care center, and patient does not want to change her location yet.  She is looking at getting some assistance with aids and housekeepers.  She would like to be considered for a power wheelchair or power scooter.  UPDATE (05/30/19, VRP): Since last visit, doing poorly; husband having severe dementia issues causing significant stress. Patient is struggling with gait and weakness.    UPDATE  (11/08/18, VRP): Since last visit, doing about the same. MS symptoms are stable. Severity moderate. No alleviating or aggravating factors. Tolerating meds. Now more caregiver stress, as her husband has progressive dementia.   UPDATE (12/07/17, VRP): Since last visit, now with swelling in feet x 2 months. Improved in last 1-2 weeks. No pain. No triggers. Had 1 day with some addl leg weakness (1 hour) then resolved on 11/29/17. Symptoms are now at baseline. Continues PT and water therapy. Tolerating tysabri.    UPDATE (08/10/17, VRP): Since last visit, doing well. Tolerating tysabri and generic amprya. No alleviating or aggravating factors. Hot flashes improved. Fatigue improved.   UPDATE (02/11/17, VRP): Since last visit, doing well. Tolerating meds. No alleviating or aggravating factors. Now on xeomin (Aug and Dec 2018) and spasticity improved. On generic ampyra and doing well.   UPDATE 08/11/16: Since last visit, doing well. Tolerating tysabri and baclofen. Heat has been challenging with spasms. Gait diff with knees rubbing together, slightly worse.  UPDATE 02/11/16: Since last visit, balance diff continues. No flares. MRIs stable. Tolerating tysabri.   UPDATE 10/08/15: Since last visit, pt feels some progression of gait diff and walking problems. No specific attack or flare up.  UPDATE 07/04/15: Since last visit, doing well. Tolerating tysabri and ampyra.  UPDATE 04/02/15: Since last visit, doing well. Tolerating tysabri and ampyra. Back on nortriptyline (is helping burning pain in feet).   UPDATE 01/03/15: Since last visit, doing better on ampyra (better gait and walking speed). More foot/calf tingling burning pain x 1-2 months. Now with Dr. Pernell Dupre  pain mgmt for back injections x 2, which is helping.  UPDATE 10/02/14: Since last visit, doing well. Requesting transition of care to  for pain mgmt; previously at [facility] with Dr. Azzie Glatter.  UPDATE 05/22/14: Since last visit, doing well. On tysabri since  March 2016. Fatigue and bladder control much improved. Gait is stable.   UPDATE 02/20/14: Since last visit, had reviewed lab results and now ready to start MS medication. Now would like to start tysabri.  UPDATE 01/09/14: Since last visit, no new events symptoms. LP results reviewed. Didn't get lab work at last visit for MS mimics.   UPDATE 12/07/13: Patient is stable with symptoms. MRI scans reviewed. Continues to have weakness in legs and balance difficulty.  UPDATE 11/21/13: 78 year old right-handed female here for evaluation of lower extremity spasms and weakness. 2002 patient developed onset of right leg stiffness, spasm, weakness. Patient was diagnosed with cervical spine disease, bone spur ridging, and treated with anterior cervical discectomy and fusion in 2003. Following this her right leg weakness and spasms significantly improved. However one year later her symptoms return. Patient followed up with several surgeons and ultimately had a second cervical spine surgery in 2006. Following this patient had slight improvement of right leg however within several months her weakness, spasms and gait continued to decline. Patient transitioned needing a cane to walk. 2013 patient was evaluated by a neurologist in [place], to evaluate her right leg weakness. Apparently patient had a "multiple sclerosis" workup including MRI of the brain and extensive blood testing. Patient also had EMG nerve conduction studies looking for peroneal neuropathy but apparently all studies were normal. MRI of the brain did show a right medulla lesion which was reported as a stroke. Over the past 1 year patient has developed new onset left leg weakness, spasm, stiffness. Patient followed up with orthopedic surgery who then referred patient to me for evaluation. Last MRI of cervical spine was in 2006 at [facility]. No bowel or bladder incontinence. No significant numbness or tingling. No weakness in upper extremities. No vision loss,  vision changes, slurred speech or trouble talking.   REVIEW OF SYSTEMS: Full 14 system review of systems performed and negative except: as per HPI   ALLERGIES: No Known Allergies  HOME MEDICATIONS: Outpatient Medications Prior to Visit  Medication Sig Dispense Refill   acetaminophen (TYLENOL) 500 MG tablet Take 500 mg by mouth as needed.     Cranberry-Vitamin C-Vitamin E 4200-20-3 MG-MG-UNIT CAPS Take by mouth 2 times daily at 12 noon and 4 pm. 2 tabs twice daily     Dimethyl Fumarate 240 MG CPDR TAKE 1 CAPSULE BY MOUTH TWICE  DAILY 60 capsule 5   lisinopril (PRINIVIL,ZESTRIL) 10 MG tablet Take 1 tablet by mouth daily.     MYRBETRIQ 25 MG TB24 tablet Take by mouth.     nortriptyline (PAMELOR) 50 MG capsule Take 1 capsule by mouth daily.     oxybutynin (DITROPAN-XL) 5 MG 24 hr tablet Take 5 mg by mouth daily.     traMADol-acetaminophen (ULTRACET) 37.5-325 MG per tablet Take 1 tablet by mouth daily as needed.     baclofen (LIORESAL) 10 MG tablet TAKE TWO TABLETS BY MOUTH THREE TIMES DAILY AS NEEDED 540 tablet 1   Ibuprofen (ADVIL PO) Take by mouth in the morning and at bedtime.     Facility-Administered Medications Prior to Visit  Medication Dose Route Frequency Provider Last Rate Last Admin   incobotulinumtoxinA (XEOMIN) 100 units injection 500 Units  500 Units  Intramuscular Q90 days Levert Feinstein, MD   500 Units at 03/31/22 1510   incobotulinumtoxinA (XEOMIN) 100 units injection 500 Units  500 Units Intramuscular Q90 days Levert Feinstein, MD   500 Units at 07/23/22 1228   incobotulinumtoxinA (XEOMIN) 100 units injection 500 Units  500 Units Intramuscular Q90 days Levert Feinstein, MD   500 Units at 02/04/23 1230    PAST MEDICAL HISTORY: Past Medical History:  Diagnosis Date   Cervical myelopathy (HCC)    Multiple sclerosis (HCC)    Osteoporosis 2018   hip    PAST SURGICAL HISTORY: Past Surgical History:  Procedure Laterality Date   CERVICAL FUSION  01/2001, 01/2004   C3-C6    FAMILY  HISTORY: Family History  Problem Relation Age of Onset   Transient ischemic attack Mother    Pancreatic cancer Father    Breast cancer Cousin     SOCIAL HISTORY:  Social History   Socioeconomic History   Marital status: Married    Spouse name: Barbra Miner   Number of children: 0   Years of education: College   Highest education level: Not on file  Occupational History    Employer: OTHER    Comment: n/a  Tobacco Use   Smoking status: Former    Current packs/day: 0.00    Average packs/day: 1 pack/day for 15.0 years (15.0 ttl pk-yrs)    Types: Cigarettes    Start date: 01/21/1968    Quit date: 01/21/1983    Years since quitting: 40.2   Smokeless tobacco: Never  Substance and Sexual Activity   Alcohol use: Yes    Alcohol/week: 0.0 standard drinks of alcohol    Comment: occasionally- wine   Drug use: No   Sexual activity: Not on file  Other Topics Concern   Not on file  Social History Narrative   05/30/19 Patient lives at home alone,  Spouse in SNF for  dementia.   Caffeine Use-none      Update 04/14/23 - Patient lives alone. Spouse is deceased.    Social Drivers of Corporate investment banker Strain: Not on file  Food Insecurity: Not on file  Transportation Needs: Not on file  Physical Activity: Not on file  Stress: Not on file  Social Connections: Not on file  Intimate Partner Violence: Not on file     PHYSICAL EXAM  GENERAL EXAM/CONSTITUTIONAL: Vitals:  Vitals:   04/14/23 1559  BP: 99/66  Pulse: (!) 108  Weight: 105 lb (47.6 kg)   Body mass index is 19.84 kg/m. Wt Readings from Last 3 Encounters:  04/14/23 105 lb (47.6 kg)  07/21/22 112 lb (50.8 kg)  04/14/22 108 lb (49 kg)   Patient is in no distress; well developed, nourished and groomed; neck is supple  CARDIOVASCULAR: Examination of carotid arteries is normal; no carotid bruits Regular rate and rhythm, no murmurs Examination of peripheral vascular system by observation and palpation is  normal  EYES: Ophthalmoscopic exam of optic discs and posterior segments is normal; no papilledema or hemorrhages No results found.  MUSCULOSKELETAL: Gait, strength, tone, movements noted in Neurologic exam below  NEUROLOGIC: MENTAL STATUS:      No data to display         awake, alert, oriented to person, place and time recent and remote memory intact normal attention and concentration language fluent, comprehension intact, naming intact fund of knowledge appropriate  CRANIAL NERVE:  2nd - no papilledema on fundoscopic exam 2nd, 3rd, 4th, 6th - pupils equal and reactive  to light, visual fields full to confrontation, extraocular muscles intact, no nystagmus 5th - facial sensation symmetric 7th - facial strength symmetric 8th - hearing intact 9th - palate elevates symmetrically, uvula midline 11th - shoulder shrug symmetric 12th - tongue protrusion midline  MOTOR:  BUE 4 BLE INCREASED TONE, 4 PROX AND 3 DISTAL  SENSORY:  normal and symmetric to light touch  COORDINATION:  finger-nose-finger, fine finger movements SLOW  REFLEXES:  deep tendon reflexes 1+ IN BUE AND BLE  GAIT/STATION:  narrow based gait; SHORT STEPS; SLOW, UNSTEADY; WITH WALKER      DIAGNOSTIC DATA (LABS, IMAGING, TESTING) - I reviewed patient records, labs, notes, testing and imaging myself where available.  Lab Results  Component Value Date   WBC 4.2 06/23/2022   HGB 11.3 06/23/2022   HCT 34.6 06/23/2022   MCV 98 (H) 06/23/2022   PLT 235 06/23/2022      Component Value Date/Time   NA 142 04/14/2022 1646   K 4.8 04/14/2022 1646   CL 101 04/14/2022 1646   CO2 26 04/14/2022 1646   GLUCOSE 89 04/14/2022 1646   BUN 24 04/14/2022 1646   CREATININE 0.73 04/14/2022 1646   CALCIUM 10.8 (H) 04/14/2022 1646   PROT 6.7 04/14/2022 1646   ALBUMIN 4.8 04/14/2022 1646   AST 17 04/14/2022 1646   ALT 18 04/14/2022 1646   ALKPHOS 43 (L) 04/14/2022 1646   BILITOT 0.3 04/14/2022 1646    GFRNONAA 89 05/30/2019 1638   GFRAA 102 05/30/2019 1638   No results found for: "CHOL", "HDL", "LDLCALC", "LDLDIRECT", "TRIG", "CHOLHDL" No results found for: "HGBA1C" No results found for: "VITAMINB12" No results found for: "TSH"  Vit D, 25-Hydroxy  Date Value Ref Range Status  01/03/2015 36.8 30.0 - 100.0 ng/mL Final    Comment:    Vitamin D deficiency has been defined by the Institute of Medicine and an Endocrine Society practice guideline as a level of serum 25-OH vitamin D less than 20 ng/mL (1,2). The Endocrine Society went on to further define vitamin D insufficiency as a level between 21 and 29 ng/mL (2). 1. IOM (Institute of Medicine). 2010. Dietary reference    intakes for calcium and D. [place]: The    [employer]. 2. Holick MF, Binkley , Bischoff-Ferrari HA, et al.    Evaluation, treatment, and prevention of vitamin D    deficiency: an Endocrine Society clinical practice    guideline. JCEM. 2011 Jul; 96(7):1911-30.     I reviewed images myself. In my review, I think there is a 2cm spinal cord lesion (right side, C6-C7) and a smaller left T1 lesion, suspicious for demyelinating disease, on the MRI cervical spine from 10/08/03. No adjacent disc or bone degeneration or spinal stenosis. The MRI report states that the cord appears normal. -VRP  10/08/03 MRI cervical spine (images reviewed) - Satisfactory anterior plate fusion of C4 and C5. Disk degeneration and spondylosis at C3-4 and C5-6 with biforaminal narrowing but no significant central canal stenosis. There is no disk protrusion.    12/29/13 LP - WBC 1, RBC 0, protein 26, glucose 59, OCB > 5, gram stain neg  04/26/17 MRI of the brain with and without contrast [I reviewed images myself and agree with interpretation. 1 small new lesion. -VRP]  1.   Small left occipital subcortical T2/FLAIR hyperintense focus with subtle hyperintensity on diffusion-weighted images.  This focus was not present on the  08/24/2016 MRI.  The etiology is uncertain but it could represent a  sub-chronic focus of chronic ischemic or demyelinating change.   2.   There are a couple T2/FLAIR hyperintense foci in the subcortical and deep white matter.  This is a nonspecific finding and could be incidental at this age, will be due to minimal chronic microvascular ischemic change or demyelination.  None of the foci appears to be acute and there is no change when compared to the 08/24/2016 MRI. 3.   Mild generalized cortical atrophy, typical for age. 4.   There is a normal enhancement pattern.  04/26/17 MRI of the cervical spine with and without contrast [I reviewed images myself and agree with interpretation. Stable since 2017.  -VRP]  1.   There are 3 small T2/FLAIR hyperintense foci within the spinal cord adjacent to C6, C7 and T2.  All of these were present on the 10/19/2015 MRI.  None of these enhance. 2.   Multilevel degenerative changes as detailed above.   The degenerative changes at C6-C7 have slightly progressed when compared to the previous MRI.  There did not appear to be any nerve root compression. 3.   ACDF from C3 through C6.   4.   There is a normal enhancement pattern of there are no acute findings.  04/26/17 Unremarkable MRI thoracic spine (with and without). [I reviewed images myself and agree with interpretation. -VRP]   05/19/18 MRI cervical spine (with and without) demonstrating: - Small chronic plaques at C6 on the left and C7 on the right; no acute plaques - No new plaques from MRI on 04/26/2017; prior T2 level plaque is not well visualized on current study  05/19/18 MRI thoracic spine (with and without) demonstrating:  - subtle T2 hyperintensity at C7 level on sagittal views; consistent with chronic plaque - no acute plaques; remainder of spinal cord is also unremarkable  12/17/19 MRI brain 1. In comparison to MRI from November 28, 2018, there are a few new small juxtacortical and periventricular T2/FLAIR  hyperintensities in bifrontal white matter, which are nonspecific but could relate to demyelination in this patient with a reported history of multiple sclerosis. 2. No abnormal enhancement or restricted diffusion to suggest active demyelination. 3. Small remote left occipital lobe infarct.  12/17/19 MRI cervical spine 1. Possible focal T2 hyperintensity in the lateral left cord at C5 versus artifact related to adjacent fusion, similar to prior. No convincing new cord signal abnormality. No cord enhancement to suggest active demyelination. 2. C3-C6 ACDF. Similar adjacent level anterolisthesis of C6 on C7 with uncovering the disc and mild canal stenosis. Limited evaluation of the foramina at this level given motion. Suspected mild to moderate foraminal stenosis at C5-C6.   Anti-JCV antibody 01/10/14 - 0.10 negative  05/22/14 - 0.09 negative 12/2014 - negative March 2017 - negative 10/09/15 - 0.13 negative 02/11/16 JCV antibody 0.11 negative 08-13-16 JCV antibody 0.11 negative sy 03/05/17 JCV negative 0.07 08-15-17 JCV negative, index 0.08    ASSESSMENT AND PLAN  78 y.o. year old female here with  bilateral lower extremity spasticity, weakness,with brisk reflexes in upper and lower extremities, left Hoffman sign, bilateral upgoing toes. MRIs from 2005 and 2015 demonstrate multiple spinal cord lesions. Now suspect demyelinating disease, with first attack in 2002, second attack in 2003, and 3rd attack in 2014.  Then with some progression of symptoms since mid-2017.  Some worsening balance since Jan 2019.    Dx: multiple sclerosis   Relapsing remitting multiple sclerosis (HCC)  Spinal paraparesis (HCC)     PLAN:  MULTIPLE SCLEROSIS (RRMS; secondary progression  by symptoms) - continue dimethyl fumarate; annual CBC, CMP per PCP (will check today since not checked lately)  INCONTINENCE - myrbetriq; schedule bathroom breaks; continue fiber supplement  MUSCLE SPASMS  (stable) - continue baclofen, xeomin (per Dr. Terrace Arabia)  Meds ordered this encounter  Medications   baclofen (LIORESAL) 10 MG tablet    Sig: TAKE TWO TABLETS BY MOUTH THREE TIMES DAILY AS NEEDED    Dispense:  540 tablet    Refill:  4   Return in about 1 year (around 04/13/2024) for MyChart visit (15 min).      Suanne Marker, MD 04/14/2023, 4:12 PM Certified in Neurology, Neurophysiology and Neuroimaging  [facility] [address], [address] [place], [place] [address] [phone]

## 2023-05-06 ENCOUNTER — Ambulatory Visit (INDEPENDENT_AMBULATORY_CARE_PROVIDER_SITE_OTHER): Payer: Medicare Other | Admitting: Neurology

## 2023-05-06 ENCOUNTER — Ambulatory Visit: Payer: Medicare Other | Admitting: Neurology

## 2023-05-06 VITALS — BP 122/70 | HR 74

## 2023-05-06 DIAGNOSIS — G8250 Quadriplegia, unspecified: Secondary | ICD-10-CM | POA: Diagnosis not present

## 2023-05-06 DIAGNOSIS — G35 Multiple sclerosis: Secondary | ICD-10-CM

## 2023-05-06 MED ORDER — INCOBOTULINUMTOXINA 100 UNITS IM SOLR
500.0000 [IU] | Freq: Once | INTRAMUSCULAR | Status: AC
  Administered 2023-05-06: 500 [IU] via INTRAMUSCULAR

## 2023-05-06 NOTE — Progress Notes (Signed)
 xeomin 500 units x 5 vial  [id number] [id number] x 5 Exp-08/2025 B/B OR s/p Bacteriostatic 0.9% Sodium Chloride- 10 mL  EXB:[id number] Expiration: 11/21/2023 NDC: 4401-0272-53 BB Dx: G64.40 WITNESSED [id number] J RN

## 2023-05-06 NOTE — Progress Notes (Signed)
 ASSESSMENT AND PLAN  Amy Bray is a 78 y.o. female   Multiple sclerosis Spastic quadriplegia Electrical stimulation guided xeomin injection, used xeomin 500 units (100 units was dissolved into 2 cc of NS)  Right adductor longus 200 units Right adductor magnus 50 units  Left adductor longus 200 units Left abductor magnus 50 units  Return to clinic in 3 months for repeat injection   DIAGNOSTIC DATA (LABS, IMAGING, TESTING) - I reviewed patient records, labs, notes, testing and imaging myself where available.   MEDICAL HISTORY:  Amy Bray, returned for botulism toxin injection for spastic bilateral lower extremity  She had a history of multiple sclerosis, presented with right leg stiffness, spasm, weakness in 2002, was diagnosed with cervical spine disease, had anterior cervical vasectomy and fusion in 2003. Following the surgery, her right leg weakness and spasms significantly improved, however a year later the symptoms returned, she ultimately had second cervical spine surgery in 2006, following second surgery, she only has mild improvement of her symptoms, she continue has progressive worsening spastic gait abnormality,  She was eventually diagnosed with multiple sclerosis, began to receive Tysabri infusion, there was no significant change taking Tysabri, was eventually switched to Tecfidera since 2022, tolerating it well,  She was receiving EMG guided Botox and toxin injection [facility] with good result, transferred to our clinic since August 2018, was receiving injection every 3 months without significant side effect, last injection was February 08, 2020, when she reported extreme stress at home, moved her mother to assisted living, have to self her mother's house, her husband suffered dementia, living memory unit,  She is also taking baclofen 10 mg 2 tablets 3 times a day as needed, Myrbetriq 25 mg every night, nortriptyline 50 mg every night, oxybutynin XL 5 mg daily,  She  go to swing regularly, continue to be independent, lost her mother and husband at the beginning of this year,  Update December 18, 2021: She was found to injection well, does help her bilateral hip adduction, spasm,  UPDATE March 31 2022: Every 3 months injection has helped her bilateral lower extremity adductor muscle spasm, she can walk better, she continued to swim few times each week  No new multiple sclerosis symptoms, pending follow-up with Dr. Marjory Lies in 2 weeks, on Tecfidera  UPDATE July 23 2022: She responded well to previous injection, no significant side effect noted, tendency for bilateral lower extremity adduction, left worse than right,  UPDATE Oct 29 2022: She is doing very well, continue water aerobic, the benefit of Botox lasting for 2 months, then noticed gradual worsening lower extremity spastic adduction  UPDATE Jan 15th 2025: She does think every 3 months xeomin injection helped her lower extremity adduction, spasticity, continue to swim regularly  UPDATE April 16th 2025: She is well, remaining active, go up to meet her friend regularly, driving, water aerobic regularly, complains bilateral lower extremity adductor spasticity  PHYSICAL EXAM:  Vitals:   05/06/23 1457  BP: 122/70  Pulse: 74   MOTOR: No significant upper extremity weakness, significant spasticity of bilateral lower extremity,  tendency for bilateral hip adduction, barely antigravity movement of bilateral lower extremity proximal and some muscle weakness  GAIT/STANCE: Rely on her walker, spastic, stiff, unsteady gait  REVIEW OF SYSTEMS:  Full 14 system review of systems performed and notable only for as above All other review of systems were negative.   ALLERGIES: No Known Allergies  HOME MEDICATIONS: Current Outpatient Medications  Medication Sig Dispense Refill  acetaminophen (TYLENOL) 500 MG tablet Take 500 mg by mouth as needed.     baclofen (LIORESAL) 10 MG tablet TAKE TWO TABLETS  BY MOUTH THREE TIMES DAILY AS NEEDED 540 tablet 4   Cranberry-Vitamin C-Vitamin E 4200-20-3 MG-MG-UNIT CAPS Take by mouth 2 times daily at 12 noon and 4 pm. 2 tabs twice daily     Dimethyl Fumarate 240 MG CPDR TAKE 1 CAPSULE BY MOUTH TWICE  DAILY 60 capsule 5   lisinopril (PRINIVIL,ZESTRIL) 10 MG tablet Take 1 tablet by mouth daily.     MYRBETRIQ 25 MG TB24 tablet Take by mouth.     nortriptyline (PAMELOR) 50 MG capsule Take 1 capsule by mouth daily.     oxybutynin (DITROPAN-XL) 5 MG 24 hr tablet Take 5 mg by mouth daily.     traMADol-acetaminophen (ULTRACET) 37.5-325 MG per tablet Take 1 tablet by mouth daily as needed.     Current Facility-Administered Medications  Medication Dose Route Frequency Provider Last Rate Last Admin   incobotulinumtoxinA (XEOMIN) 100 units injection 500 Units  500 Units Intramuscular Q90 days Phebe Brasil, MD   500 Units at 03/31/22 1510   incobotulinumtoxinA (XEOMIN) 100 units injection 500 Units  500 Units Intramuscular Q90 days Phebe Brasil, MD   500 Units at 07/23/22 1228   incobotulinumtoxinA (XEOMIN) 100 units injection 500 Units  500 Units Intramuscular Q90 days Phebe Brasil, MD   500 Units at 02/04/23 1230   incobotulinumtoxinA (XEOMIN) 100 units injection 500 Units  500 Units Intramuscular Once Phebe Brasil, MD        PAST MEDICAL HISTORY: Past Medical History:  Diagnosis Date   Cervical myelopathy (HCC)    Multiple sclerosis (HCC)    Osteoporosis 2018   hip    PAST SURGICAL HISTORY: Past Surgical History:  Procedure Laterality Date   CERVICAL FUSION  01/2001, 01/2004   C3-C6    FAMILY HISTORY: Family History  Problem Relation Age of Onset   Transient ischemic attack Mother    Pancreatic cancer Father    Breast cancer Cousin     SOCIAL HISTORY: Social History   Socioeconomic History   Marital status: Married    Spouse name: Danisa Kopec   Number of children: 0   Years of education: College   Highest education level: Not on file   Occupational History    Employer: OTHER    Comment: n/a  Tobacco Use   Smoking status: Former    Current packs/day: 0.00    Average packs/day: 1 pack/day for 15.0 years (15.0 ttl pk-yrs)    Types: Cigarettes    Start date: 01/21/1968    Quit date: 01/21/1983    Years since quitting: 40.3   Smokeless tobacco: Never  Substance and Sexual Activity   Alcohol use: Yes    Alcohol/week: 0.0 standard drinks of alcohol    Comment: occasionally- wine   Drug use: No   Sexual activity: Not on file  Other Topics Concern   Not on file  Social History Narrative   05/30/19 Patient lives at home alone,  Spouse in SNF for  dementia.   Caffeine Use-none      Update 04/14/23 - Patient lives alone. Spouse is deceased.    Social Drivers of Corporate investment banker Strain: Not on file  Food Insecurity: Not on file  Transportation Needs: Not on file  Physical Activity: Not on file  Stress: Not on file  Social Connections: Not on file  Intimate Partner Violence: Not on file  Wally Shevchenko, M.D. Ph.D.  [facility] [address], [address] [place], [place] [address] Ph: [phone] Fax: [phone]  CC:  Alejandro Hurt, FNP [address] [place],  [place] [address]  Alejandro Hurt, FNP

## 2023-06-15 ENCOUNTER — Other Ambulatory Visit: Payer: Self-pay | Admitting: Diagnostic Neuroimaging

## 2023-06-15 DIAGNOSIS — G35 Multiple sclerosis: Secondary | ICD-10-CM

## 2023-08-05 ENCOUNTER — Ambulatory Visit: Admitting: Neurology

## 2023-08-12 ENCOUNTER — Ambulatory Visit (INDEPENDENT_AMBULATORY_CARE_PROVIDER_SITE_OTHER): Admitting: Neurology

## 2023-08-12 VITALS — BP 117/72

## 2023-08-12 DIAGNOSIS — R269 Unspecified abnormalities of gait and mobility: Secondary | ICD-10-CM

## 2023-08-12 DIAGNOSIS — G35 Multiple sclerosis: Secondary | ICD-10-CM

## 2023-08-12 DIAGNOSIS — G8250 Quadriplegia, unspecified: Secondary | ICD-10-CM | POA: Diagnosis not present

## 2023-08-12 MED ORDER — INCOBOTULINUMTOXINA 100 UNITS IM SOLR
500.0000 [IU] | INTRAMUSCULAR | Status: AC
  Administered 2023-08-12: 500 [IU] via INTRAMUSCULAR

## 2023-08-12 NOTE — Progress Notes (Signed)
 ASSESSMENT AND PLAN  Amy Bray is a 78 y.o. female   Multiple sclerosis Spastic quadriplegia Electrical stimulation guided xeomin  injection, used xeomin  500 units (100 units was dissolved into 2 cc of NS)  Right adductor longus 200 units Right adductor magnus 50 units  Left adductor longus 200 units Left abductor magnus 50 units  Return to clinic in 3 months for repeat injection   DIAGNOSTIC DATA (LABS, IMAGING, TESTING) - I reviewed patient records, labs, notes, testing and imaging myself where available.   MEDICAL HISTORY:  Amy Bray, returned for botulism toxin injection for spastic bilateral lower extremity  She had a history of multiple sclerosis, presented with right leg stiffness, spasm, weakness in 2002, was diagnosed with cervical spine disease, had anterior cervical vasectomy and fusion in 2003. Following the surgery, her right leg weakness and spasms significantly improved, however a year later the symptoms returned, she ultimately had second cervical spine surgery in 2006, following second surgery, she only has mild improvement of her symptoms, she continue has progressive worsening spastic gait abnormality,  She was eventually diagnosed with multiple sclerosis, began to receive Tysabri  infusion, there was no significant change taking Tysabri , was eventually switched to Tecfidera  since 2022, tolerating it well,  She was receiving EMG guided Botox and toxin injection [facility] with good result, transferred to our clinic since August 2018, was receiving injection every 3 months without significant side effect, last injection was February 08, 2020, when she reported extreme stress at home, moved her mother to assisted living, have to self her mother's house, her husband suffered dementia, living memory unit,  She is also taking baclofen  10 mg 2 tablets 3 times a day as needed, Myrbetriq 25 mg every night, nortriptyline 50 mg every night, oxybutynin XL 5 mg daily,  She  go to swing regularly, continue to be independent, lost her mother and husband at the beginning of this year,  Update December 18, 2021: She was found to injection well, does help her bilateral hip adduction, spasm,  UPDATE March 31 2022: Every 3 months injection has helped her bilateral lower extremity adductor muscle spasm, she can walk better, she continued to swim few times each week  No new multiple sclerosis symptoms, pending follow-up with Dr. Margaret in 2 weeks, on Tecfidera   UPDATE July 23 2022: She responded well to previous injection, no significant side effect noted, tendency for bilateral lower extremity adduction, left worse than right,  UPDATE Oct 29 2022: She is doing very well, continue water aerobic, the benefit of Botox lasting for 2 months, then noticed gradual worsening lower extremity spastic adduction  UPDATE Jan 15th 2025: She does think every 3 months xeomin  injection helped her lower extremity adduction, spasticity, continue to swim regularly  UPDATE April 16th 2025: She is well, remaining active, go up to meet her friend regularly, driving, water aerobic regularly, complains bilateral lower extremity adductor spasticity  UPDATE August 12 2023: She continued to do very well, no significant side effect noted with previous injection, did help her lower extremity spasticity,  PHYSICAL EXAM:  Vitals:   08/12/23 1520  BP: 117/72   MOTOR: No significant upper extremity weakness, significant spasticity of bilateral lower extremity,  tendency for bilateral hip adduction, barely antigravity movement of bilateral lower extremity proximal and some muscle weakness  GAIT/STANCE: Rely on her walker, spastic, stiff, unsteady gait  REVIEW OF SYSTEMS:  Full 14 system review of systems performed and notable only for as above All other review of  systems were negative.   ALLERGIES: No Known Allergies  HOME MEDICATIONS: Current Outpatient Medications  Medication Sig  Dispense Refill   acetaminophen (TYLENOL) 500 MG tablet Take 500 mg by mouth as needed.     baclofen  (LIORESAL ) 10 MG tablet TAKE TWO TABLETS BY MOUTH THREE TIMES DAILY AS NEEDED 540 tablet 4   Cranberry-Vitamin C-Vitamin E 4200-20-3 MG-MG-UNIT CAPS Take by mouth 2 times daily at 12 noon and 4 pm. 2 tabs twice daily     Dimethyl Fumarate  240 MG CPDR TAKE 1 CAPSULE BY MOUTH TWICE  DAILY 60 capsule 11   lisinopril (PRINIVIL,ZESTRIL) 10 MG tablet Take 1 tablet by mouth daily.     MYRBETRIQ 25 MG TB24 tablet Take by mouth.     nortriptyline (PAMELOR) 50 MG capsule Take 1 capsule by mouth daily.     oxybutynin (DITROPAN-XL) 5 MG 24 hr tablet Take 5 mg by mouth daily.     traMADol-acetaminophen (ULTRACET) 37.5-325 MG per tablet Take 1 tablet by mouth daily as needed.     Current Facility-Administered Medications  Medication Dose Route Frequency Provider Last Rate Last Admin   incobotulinumtoxinA  (XEOMIN ) 100 units injection 500 Units  500 Units Intramuscular Q90 days Onita Duos, MD   500 Units at 03/31/22 1510   incobotulinumtoxinA  (XEOMIN ) 100 units injection 500 Units  500 Units Intramuscular Q90 days Onita Duos, MD   500 Units at 07/23/22 1228   incobotulinumtoxinA  (XEOMIN ) 100 units injection 500 Units  500 Units Intramuscular Q90 days Onita Duos, MD   500 Units at 02/04/23 1230   incobotulinumtoxinA  (XEOMIN ) 100 units injection 500 Units  500 Units Intramuscular Q90 days Onita Duos, MD        PAST MEDICAL HISTORY: Past Medical History:  Diagnosis Date   Cervical myelopathy (HCC)    Multiple sclerosis (HCC)    Osteoporosis 2018   hip    PAST SURGICAL HISTORY: Past Surgical History:  Procedure Laterality Date   CERVICAL FUSION  01/2001, 01/2004   C3-C6    FAMILY HISTORY: Family History  Problem Relation Age of Onset   Transient ischemic attack Mother    Pancreatic cancer Father    Breast cancer Cousin     SOCIAL HISTORY: Social History   Socioeconomic History   Marital  status: Married    Spouse name: Jenafer Winterton   Number of children: 0   Years of education: College   Highest education level: Not on file  Occupational History    Employer: OTHER    Comment: n/a  Tobacco Use   Smoking status: Former    Current packs/day: 0.00    Average packs/day: 1 pack/day for 15.0 years (15.0 ttl pk-yrs)    Types: Cigarettes    Start date: 01/21/1968    Quit date: 01/21/1983    Years since quitting: 40.5   Smokeless tobacco: Never  Substance and Sexual Activity   Alcohol use: Yes    Alcohol/week: 0.0 standard drinks of alcohol    Comment: occasionally- wine   Drug use: No   Sexual activity: Not on file  Other Topics Concern   Not on file  Social History Narrative   05/30/19 Patient lives at home alone,  Spouse in SNF for  dementia.   Caffeine Use-none      Update 04/14/23 - Patient lives alone. Spouse is deceased.    Social Drivers of Corporate investment banker Strain: Not on file  Food Insecurity: Not on file  Transportation Needs: Not on file  Physical Activity: Not on file  Stress: Not on file  Social Connections: Not on file  Intimate Partner Violence: Not on file      Modena Callander, M.D. Ph.D.  [facility] [address] [place], [place] [address] Ph: [phone] Fax: [phone]  CC:  Marvene Prentice SAUNDERS, FNP [address] [place],  [place] [address]  Marvene Prentice SAUNDERS, FNP

## 2023-08-12 NOTE — Progress Notes (Signed)
 xeomin  100 units x 5 vial  [id number] [id number] x 5 Exp-2027/09 B/B  Bacteriostatic 0.9% Sodium Chloride- 10 mL  Onu:[id number] Expiration: 11/19/24 NDC: 9590-8033-97 BB Dx: H17.79 WITNESSED AB:Jempo J RN

## 2023-08-27 ENCOUNTER — Other Ambulatory Visit: Payer: Self-pay

## 2023-08-27 ENCOUNTER — Emergency Department (HOSPITAL_COMMUNITY)

## 2023-08-27 ENCOUNTER — Observation Stay (HOSPITAL_COMMUNITY)
Admission: EM | Admit: 2023-08-27 | Discharge: 2023-08-28 | Disposition: A | Discharge status: Home / Self Care | Attending: Internal Medicine | Admitting: Internal Medicine

## 2023-08-27 ENCOUNTER — Encounter (HOSPITAL_COMMUNITY): Payer: Self-pay | Admitting: Emergency Medicine

## 2023-08-27 DIAGNOSIS — R7989 Other specified abnormal findings of blood chemistry: Secondary | ICD-10-CM | POA: Insufficient documentation

## 2023-08-27 DIAGNOSIS — R2689 Other abnormalities of gait and mobility: Secondary | ICD-10-CM | POA: Insufficient documentation

## 2023-08-27 DIAGNOSIS — M6281 Muscle weakness (generalized): Secondary | ICD-10-CM | POA: Diagnosis not present

## 2023-08-27 DIAGNOSIS — I10 Essential (primary) hypertension: Secondary | ICD-10-CM | POA: Diagnosis not present

## 2023-08-27 DIAGNOSIS — W06XXXA Fall from bed, initial encounter: Secondary | ICD-10-CM | POA: Diagnosis not present

## 2023-08-27 DIAGNOSIS — T796XXA Traumatic ischemia of muscle, initial encounter: Secondary | ICD-10-CM | POA: Diagnosis not present

## 2023-08-27 DIAGNOSIS — Z79899 Other long term (current) drug therapy: Secondary | ICD-10-CM | POA: Insufficient documentation

## 2023-08-27 DIAGNOSIS — G35 Multiple sclerosis: Secondary | ICD-10-CM | POA: Diagnosis not present

## 2023-08-27 DIAGNOSIS — Z1152 Encounter for screening for COVID-19: Secondary | ICD-10-CM | POA: Insufficient documentation

## 2023-08-27 DIAGNOSIS — Y92009 Unspecified place in unspecified non-institutional (private) residence as the place of occurrence of the external cause: Secondary | ICD-10-CM

## 2023-08-27 DIAGNOSIS — E860 Dehydration: Principal | ICD-10-CM

## 2023-08-27 DIAGNOSIS — M6282 Rhabdomyolysis: Secondary | ICD-10-CM | POA: Diagnosis present

## 2023-08-27 DIAGNOSIS — R410 Disorientation, unspecified: Secondary | ICD-10-CM | POA: Diagnosis present

## 2023-08-27 DIAGNOSIS — Z87891 Personal history of nicotine dependence: Secondary | ICD-10-CM | POA: Diagnosis not present

## 2023-08-27 LAB — CK: Total CK: 1928 U/L — ABNORMAL HIGH (ref 38–234)

## 2023-08-27 LAB — BASIC METABOLIC PANEL WITH GFR
Anion gap: 15 (ref 5–15)
BUN: 30 mg/dL — ABNORMAL HIGH (ref 8–23)
CO2: 22 mmol/L (ref 22–32)
Calcium: 9.2 mg/dL (ref 8.9–10.3)
Chloride: 104 mmol/L (ref 98–111)
Creatinine, Ser: 0.69 mg/dL (ref 0.44–1.00)
GFR, Estimated: >60 mL/min (ref >60)
Glucose, Bld: 93 mg/dL (ref 70–99)
Potassium: 4.1 mmol/L (ref 3.5–5.1)
Sodium: 141 mmol/L (ref 135–145)

## 2023-08-27 LAB — HEPATIC FUNCTION PANEL
ALT: 34 U/L (ref 0–44)
AST: 69 U/L — ABNORMAL HIGH (ref 15–41)
Albumin: 3.4 g/dL — ABNORMAL LOW (ref 3.5–5.0)
Alkaline Phosphatase: 38 U/L (ref 38–126)
Bilirubin, Direct: 0.2 mg/dL (ref 0.0–0.2)
Indirect Bilirubin: 0.7 mg/dL (ref 0.3–0.9)
Total Bilirubin: 0.9 mg/dL (ref 0.0–1.2)
Total Protein: 6.2 g/dL — ABNORMAL LOW (ref 6.5–8.1)

## 2023-08-27 LAB — CBC
HCT: 38.7 % (ref 36.0–46.0)
Hemoglobin: 12.4 g/dL (ref 12.0–15.0)
MCH: 31.4 pg (ref 26.0–34.0)
MCHC: 32 g/dL (ref 30.0–36.0)
MCV: 98 fL (ref 80.0–100.0)
Platelets: 233 K/uL (ref 150–400)
RBC: 3.95 MIL/uL (ref 3.87–5.11)
RDW: 11.9 % (ref 11.5–15.5)
WBC: 9.7 K/uL (ref 4.0–10.5)
nRBC: 0 % (ref 0.0–0.2)

## 2023-08-27 LAB — RESP PANEL BY RT-PCR (RSV, FLU A&B, COVID)  RVPGX2
Influenza A by PCR: NEGATIVE
Influenza B by PCR: NEGATIVE
Resp Syncytial Virus by PCR: NEGATIVE
SARS Coronavirus 2 by RT PCR: NEGATIVE

## 2023-08-27 LAB — TROPONIN I (HIGH SENSITIVITY)
Troponin I (High Sensitivity): 49 ng/L — ABNORMAL HIGH (ref <18)
Troponin I (High Sensitivity): 47 ng/L — ABNORMAL HIGH (ref <18)

## 2023-08-27 LAB — I-STAT CG4 LACTIC ACID, ED
Lactic Acid, Venous: 1 mmol/L (ref 0.5–1.9)
Lactic Acid, Venous: 0.6 mmol/L (ref 0.5–1.9)

## 2023-08-27 LAB — MAGNESIUM: Magnesium: 1.9 mg/dL (ref 1.7–2.4)

## 2023-08-27 MED ORDER — SODIUM CHLORIDE 0.9 % IV BOLUS
1000.0000 mL | Freq: Once | INTRAVENOUS | Status: AC
  Administered 2023-08-27: 1000 mL via INTRAVENOUS

## 2023-08-27 MED ORDER — DIMETHYL FUMARATE 240 MG PO CPDR: 1.0000 | DELAYED_RELEASE_CAPSULE | Freq: Two times a day (BID) | ORAL | Status: DC

## 2023-08-27 MED ORDER — ACETAMINOPHEN 650 MG RE SUPP: 650.0000 mg | Freq: Four times a day (QID) | RECTAL | Status: DC | PRN

## 2023-08-27 MED ORDER — SODIUM CHLORIDE 0.9 % IV SOLN: INTRAVENOUS | Status: DC

## 2023-08-27 MED ORDER — MIRABEGRON ER 25 MG PO TB24
25.0000 mg | ORAL_TABLET | Freq: Every day | ORAL | Status: DC
  Administered 2023-08-28: 25 mg via ORAL
  Filled 2023-08-27: qty 1

## 2023-08-27 MED ORDER — LACTATED RINGERS IV BOLUS: 1000.0000 mL | Freq: Once | INTRAVENOUS | Status: DC

## 2023-08-27 MED ORDER — TRAMADOL-ACETAMINOPHEN 37.5-325 MG PO TABS: 1.0000 | ORAL_TABLET | Freq: Four times a day (QID) | ORAL | Status: DC | PRN

## 2023-08-27 MED ORDER — OXYBUTYNIN CHLORIDE ER 10 MG PO TB24
10.0000 mg | ORAL_TABLET | Freq: Every day | ORAL | Status: DC
  Administered 2023-08-27 – 2023-08-28 (×2): 10 mg via ORAL
  Filled 2023-08-27 (×2): qty 1

## 2023-08-27 MED ORDER — ENOXAPARIN SODIUM 40 MG/0.4ML IJ SOSY
40.0000 mg | PREFILLED_SYRINGE | INTRAMUSCULAR | Status: DC
  Administered 2023-08-27: 40 mg via SUBCUTANEOUS
  Filled 2023-08-27: qty 0.4

## 2023-08-27 MED ORDER — CRANBERRY-VITAMIN C-VITAMIN E 4200-20-3 MG-MG-UNIT PO CAPS: ORAL_CAPSULE | Freq: Two times a day (BID) | ORAL | Status: DC

## 2023-08-27 MED ORDER — ACETAMINOPHEN 325 MG PO TABS: 650.0000 mg | ORAL_TABLET | Freq: Four times a day (QID) | ORAL | Status: DC | PRN

## 2023-08-27 MED ORDER — BACLOFEN 10 MG PO TABS: 10.0000 mg | ORAL_TABLET | Freq: Four times a day (QID) | ORAL | Status: DC | PRN

## 2023-08-27 MED ORDER — NORTRIPTYLINE HCL 25 MG PO CAPS
50.0000 mg | ORAL_CAPSULE | Freq: Every day | ORAL | Status: DC
  Administered 2023-08-27: 50 mg via ORAL
  Filled 2023-08-27: qty 2

## 2023-08-27 NOTE — H&P (Signed)
 History and Physical    Amy Bray FMW:[id number] DOB: 02-10-1945 DOA: 08/27/2023  PCP: Marvene Prentice SAUNDERS, FNP  Patient coming from: Home   I have personally briefly reviewed patient's old medical records available.   Chief Complaint: Clem[place]. Slid off the bed and could not get up for 24 hours   HPI: Amy Bray is a 78 y.o. female with medical history significant of essential hypertension, multiple sclerosis with lower extremity weakness, walks with a walker inside the house, uses motorized wheelchair to get around and lives at home alone.  Yesterday afternoon, she slid off the bed and landed on her buttock on the floor while trying to get out of the bed and could not get up.  She could not reach to the phone.  She laid in the floor hungry and thirsty and also defecated on the floor.  She was supposed to go to lunch date yesterday, however she did not show up so her neighbor went to check on her and found her on the floor covered in urine and feces on arrival.  Patient was also slightly confused on EMS arrival otherwise herself denied any complaints.  She was tired of laying in the floor.  She never lost consciousness.  She denied any pain in her body.  Denies any recent fever or chills nausea vomiting.  She has been in her usual self until the fall.  Currently denies any headache, nausea, neck pain or incontinence. ED Course: Blood pressure stable, heart rate is 125, troponin is 47.  Electrolytes are adequate.  BUN 30.  Skeletal survey including CT scan of the head, CT scan of the cervical spine and chest x-ray is normal.  CK is 2000.  Patient was given 1 L normal saline in the ER and advised admission. Twelve-lead EKG shows sinus tachycardia.  No acute ST-T wave changes.  Review of Systems: all systems are reviewed and pertinent positive as per HPI otherwise rest are negative.    Past Medical History:  Diagnosis Date   Cervical myelopathy (HCC)    Multiple sclerosis (HCC)    Osteoporosis  2018   hip    Past Surgical History:  Procedure Laterality Date   CERVICAL FUSION  01/2001, 01/2004   C3-C6    Social history   reports that she quit smoking about 40 years ago. Her smoking use included cigarettes. She started smoking about 55 years ago. She has a 15 pack-year smoking history. She has never used smokeless tobacco. She reports current alcohol use. She reports that she does not use drugs.  No Known Allergies  Family History  Problem Relation Age of Onset   Transient ischemic attack Mother    Pancreatic cancer Father    Breast cancer Cousin      Prior to Admission medications   Medication Sig Start Date End Date Taking? Authorizing Provider  acetaminophen  (TYLENOL ) 500 MG tablet Take 500 mg by mouth as needed. 04/28/07   [provider]  baclofen  (LIORESAL ) 10 MG tablet TAKE TWO TABLETS BY MOUTH THREE TIMES DAILY AS NEEDED 04/14/23   Penumalli, Eduard SAUNDERS, MD  Cranberry-Vitamin C -Vitamin E  4200-20-3 MG-MG-UNIT CAPS Take by mouth 2 times daily at 12 noon and 4 pm. 2 tabs twice daily    [provider]  Dimethyl Fumarate  240 MG CPDR TAKE 1 CAPSULE BY MOUTH TWICE  DAILY 06/18/23   Penumalli, Vikram R, MD  lisinopril (PRINIVIL,ZESTRIL) 10 MG tablet Take 1 tablet by mouth daily. 04/27/07   [provider]  MYRBETRIQ  25 MG TB24 tablet Take 25 mg by mouth daily. 03/14/21   [provider]  nortriptyline  (PAMELOR ) 50 MG capsule Take 1 capsule by mouth at bedtime. 08/17/13   [provider]  oxybutynin  (DITROPAN -XL) 5 MG 24 hr tablet Take 5 mg by mouth daily.    [provider]  traMADol -acetaminophen  (ULTRACET ) 37.5-325 MG per tablet Take 1 tablet by mouth daily as needed. 11/17/13   [provider]    Physical Exam: Vitals:   08/27/23 1200 08/27/23 1600 08/27/23 1612 08/27/23 1730  BP: 132/80 128/75  127/63  Pulse: (!) 111 (!) 125  (!) 124  Resp: 16 (!) 23  (!) 24  Temp: 98.2 F (36.8 C)  98.5 F (36.9 C)    TempSrc: Oral  Oral   SpO2: 100% 98%  97%    Constitutional: NAD, calm, comfortable.  Pleasant interactive. Vitals:   08/27/23 1200 08/27/23 1600 08/27/23 1612 08/27/23 1730  BP: 132/80 128/75  127/63  Pulse: (!) 111 (!) 125  (!) 124  Resp: 16 (!) 23  (!) 24  Temp: 98.2 F (36.8 C)  98.5 F (36.9 C)   TempSrc: Oral  Oral   SpO2: 100% 98%  97%   Eyes: PERRL, lids and conjunctivae normal ENMT: Mucous membranes are moist. Posterior pharynx clear of any exudate or lesions.Normal dentition.  Neck: normal, supple, no masses, no thyromegaly Respiratory: clear to auscultation bilaterally, no wheezing, no crackles. Normal respiratory effort. No accessory muscle use.  Cardiovascular: Regular rate and rhythm, no murmurs / rubs / gallops. No extremity edema. 2+ pedal pulses. No carotid bruits.  Abdomen: no tenderness, no masses palpated. No hepatosplenomegaly. Bowel sounds positive.  Musculoskeletal: no clubbing / cyanosis. No joint deformity upper and lower extremities. Good ROM, no contractures. Normal muscle tone.  Skin: no rashes, lesions, ulcers. No induration Neurologic: CN 2-12 grossly intact. Sensation intact, DTR normal. Strength 5/5 in all 4.  Patient has generalized weakness of both lower extremities.  She has partial foot drop bilateral. Psychiatric: Normal judgment and insight. Alert and oriented x 3. Normal mood.     Labs on Admission: I have personally reviewed following labs and imaging studies  CBC: Recent Labs  Lab 08/27/23 1148  WBC 9.7  HGB 12.4  HCT 38.7  MCV 98.0  PLT 233   Basic Metabolic Panel: Recent Labs  Lab 08/27/23 1148  NA 141  K 4.1  CL 104  CO2 22  GLUCOSE 93  BUN 30*  CREATININE 0.69  CALCIUM 9.2  MG 1.9   GFR: CrCl cannot be calculated (Unknown ideal weight.). Liver Function Tests: Recent Labs  Lab 08/27/23 1148  AST 69*  ALT 34  ALKPHOS 38  BILITOT 0.9  PROT 6.2*  ALBUMIN 3.4*   No results for input(s): LIPASE, AMYLASE  in the last 168 hours. No results for input(s): AMMONIA in the last 168 hours. Coagulation Profile: No results for input(s): INR, PROTIME in the last 168 hours. Cardiac Enzymes: Recent Labs  Lab 08/27/23 1148  CKTOTAL 1,928*   BNP (last 3 results) No results for input(s): PROBNP in the last 8760 hours. HbA1C: No results for input(s): HGBA1C in the last 72 hours. CBG: No results for input(s): GLUCAP in the last 168 hours. Lipid Profile: No results for input(s): CHOL, HDL, LDLCALC, TRIG, CHOLHDL, LDLDIRECT in the last 72 hours. Thyroid  Function Tests: No results for input(s): TSH, T4TOTAL, FREET4, T3FREE, THYROIDAB in the last 72 hours. Anemia Panel: No results for input(s):  VITAMINB12, FOLATE, FERRITIN, TIBC, IRON, RETICCTPCT in the last 72 hours. Urine analysis: No results found for: COLORURINE, APPEARANCEUR, LABSPEC, PHURINE, GLUCOSEU, HGBUR, BILIRUBINUR, KETONESUR, PROTEINUR, UROBILINOGEN, NITRITE, LEUKOCYTESUR  Radiological Exams on Admission: DG Chest Portable 1 View Result Date: 08/27/2023 EXAM: 1 VIEW XRAY OF THE CHEST 08/27/2023 12:36:00 PM COMPARISON: None available. CLINICAL HISTORY: Unwitnessed fall. Patient was found on floor covered in feces. Denies fall. States she slid out of bed but is confused about when this happened. Denies pain. History of multiple sclerosis, former smoker. FINDINGS: LUNGS AND PLEURA: No focal pulmonary opacity. No pulmonary edema. No pleural effusion. No pneumothorax. HEART AND MEDIASTINUM: No acute abnormality of the cardiac and mediastinal silhouettes. BONES AND SOFT TISSUES: No acute osseous abnormality. IMPRESSION: 1. No acute process. Electronically signed by: evalene coho 08/27/2023 01:28 PM EDT RP Workstation: [id number]   CT Cervical Spine Wo Contrast Result Date: 08/27/2023 EXAM: CT CERVICAL SPINE WITHOUT CONTRAST 08/27/2023 12:59:39 PM TECHNIQUE: CT of the cervical  spine was performed without the administration of intravenous contrast. Multiplanar reformatted images are provided for review. Automated exposure control, iterative reconstruction, and/or weight based adjustment of the mA/kV was utilized to reduce the radiation dose to as low as reasonably achievable. COMPARISON: MRI of the cervical spine dated 12/17/2019. CLINICAL HISTORY: Neck trauma (Age >= 65y). Pt from home via EMS. Missed a date for lunch yesterday. Family called to check on her today. Pt was found on floor covered in feces. Denies fall. States she slid out of bed but is confused about when this happened. FINDINGS: CERVICAL SPINE: BONES AND ALIGNMENT: There has been interval worsening of degenerative anterolisthesis at C6-7, from approximately 4 mm to 6 mm. The patient is again noted to be status post interbody fusion and anterior spinal fixation at C3-4, C4-5 and C5-6. There are hypertrophic changes involving the left lamina of C7. DEGENERATIVE CHANGES: There is right sided facet arthrosis at C6-7. SOFT TISSUES: No prevertebral soft tissue swelling. VASCULATURE: There is moderate calcific plaque within the carotid bulbs bilaterally. IMPRESSION: 1. No acute abnormality of the cervical spine related to the reported neck trauma. 2. Interval worsening of degenerative anterolisthesis at C6-7, from approximately 4 mm to 6 mm, compared to prior MRI dated 12/17/2019. Electronically signed by: evalene coho 08/27/2023 01:27 PM EDT RP Workstation: [id number]   CT Head Wo Contrast Result Date: 08/27/2023 EXAM: CT HEAD WITHOUT CONTRAST 08/27/2023 12:59:39 PM TECHNIQUE: CT of the head was performed without the administration of intravenous contrast. Automated exposure control, iterative reconstruction, and/or weight based adjustment of the mA/kV was utilized to reduce the radiation dose to as low as reasonably achievable. COMPARISON: None available. CLINICAL HISTORY: Head trauma, minor (Age >= 66y). Pt from home via  EMS. Missed a date for lunch yesterday. Family called to check on her today. Pt was found on floor covered in feces. Denies fall. States she slid out of bed but is confused about when this happened. FINDINGS: BRAIN AND VENTRICLES: No acute hemorrhage. Gray-white differentiation is preserved. No hydrocephalus. No extra-axial collection. No mass effect or midline shift. There is age commensurate cerebral volume loss. ORBITS: No acute abnormality. SINUSES: No acute abnormality. SOFT TISSUES AND SKULL: No acute soft tissue abnormality. No skull fracture. VASCULATURE: There is moderate calcific atheromatous disease within the carotid siphons. IMPRESSION: 1. No acute intracranial abnormality. 2. Age commensurate cerebral volume loss. 3. Moderate calcific atheromatous disease within the carotid siphons. Electronically signed by: evalene coho 08/27/2023 01:21 PM EDT RP Workstation: [id number]    EKG: Independently  reviewed.  Sinus tachycardia.  No acute ST-T wave changes.  Assessment/Plan Principal Problem:   Rhabdomyolysis     1.  Mechanical fall with traumatic rhabdomyolysis: Currently hemodynamically stable.  Renal functions are adequate.  Received 1 L fluid in the ER.  Give another 1 L fluid bolus as patient is still thirsty.  And keep on maintenance IV fluids overnight.  Recheck renal functions and CK levels tomorrow morning. PT OT and home safety evaluation.  She wants to go home tomorrow, she thinks she can take care of herself.  2.  Elevated troponins: mild troponinemia without any EKG changes and no chest pain.  Repeat troponins.  Currently no indication of acute coronary syndrome.  3.  MS with ambulatory dysfunction: On baclofen , Ultram .  Continue.  Evaluated with PT OT tomorrow.  4.  Essential hypertension: Will hold lisinopril today.   DVT prophylaxis: Lovenox  subcu Code Status: Full code Family Communication: Friend at the bedside Disposition Plan: Probably home with PT OT Consults  called: None Admission status: Observation, telemetry monitor   Renato Applebaum MD [facility]

## 2023-08-27 NOTE — ED Notes (Signed)
 ED TO INPATIENT HANDOFF REPORT  Name/Age/Gender Amy Bray 78 y.o. female  Code Status   Home/SNF/Other Home  Chief Complaint Rhabdomyolysis [M62.82]  Level of Care/Admitting Diagnosis ED Disposition     ED Disposition  Admit   Condition  --   Comment  Hospital Area: [facility] [[id number]]  Level of Care: Telemetry [5]  Admit to tele based on following criteria: Monitor QTC interval  May place patient in observation at [facility] or [facility] if equivalent level of care is available:: Yes  Covid Evaluation: Asymptomatic - no recent exposure (last 10 days) testing not required  Diagnosis: Rhabdomyolysis [728.88.ICD-9-CM]  Admitting Physician: RAENELLE CORIA [[id number]]  Attending Physician: RAENELLE CORIA [phone]  For patients discharging to extended facilities (i.e. SNF, AL, group homes or LTAC) initiate:: Discharge to SNF/Facility Placement COVID-19 Lab Testing Protocol          Medical History Past Medical History:  Diagnosis Date   Cervical myelopathy (HCC)    Multiple sclerosis (HCC)    Osteoporosis 2018   hip    Allergies No Known Allergies  IV Location/Drains/Wounds Patient Lines/Drains/Airways Status     Active Line/Drains/Airways     Name Placement date Placement time Site Days   Peripheral IV 08/27/23 18 G 1 Left Antecubital 08/27/23  1149  Antecubital  less than 1            Labs/Imaging Results for orders placed or performed during the hospital encounter of 08/27/23 (from the past 48 hours)  Basic metabolic panel     Status: Abnormal   Collection Time: 08/27/23 11:48 AM  Result Value Ref Range   Sodium 141 135 - 145 mmol/L   Potassium 4.1 3.5 - 5.1 mmol/L   Chloride 104 98 - 111 mmol/L   CO2 22 22 - 32 mmol/L   Glucose, Bld 93 70 - 99 mg/dL    Comment: Glucose reference range applies only to samples taken after fasting for at least 8 hours.   BUN 30 (H) 8 - 23 mg/dL   Creatinine, Ser 9.30 0.44 - 1.00 mg/dL    Calcium 9.2 8.9 - 89.6 mg/dL   GFR, Estimated >39 >39 mL/min    Comment: (NOTE) Calculated using the CKD-EPI Creatinine Equation (2021)    Anion gap 15 5 - 15    Comment: Performed at [facility], [address] [place], [place] [address]  CBC     Status: None   Collection Time: 08/27/23 11:48 AM  Result Value Ref Range   WBC 9.7 4.0 - 10.5 K/uL   RBC 3.95 3.87 - 5.11 MIL/uL   Hemoglobin 12.4 12.0 - 15.0 g/dL   HCT 61.2 63.9 - 53.9 %   MCV 98.0 80.0 - 100.0 fL   MCH 31.4 26.0 - 34.0 pg   MCHC 32.0 30.0 - 36.0 g/dL   RDW 88.0 88.4 - 84.4 %   Platelets 233 150 - 400 K/uL   nRBC 0.0 0.0 - 0.2 %    Comment: Performed at [facility], [address], [place], [place] [address]  CK     Status: Abnormal   Collection Time: 08/27/23 11:48 AM  Result Value Ref Range   Total CK 1,928 (H) 38 - 234 U/L    Comment: Performed at [facility], [address], [place], [place] [address]  Hepatic function panel     Status: Abnormal   Collection Time: 08/27/23 11:48 AM  Result Value Ref Range  Total Protein 6.2 (L) 6.5 - 8.1 g/dL   Albumin 3.4 (L) 3.5 - 5.0 g/dL   AST 69 (H) 15 - 41 U/L   ALT 34 0 - 44 U/L   Alkaline Phosphatase 38 38 - 126 U/L   Total Bilirubin 0.9 0.0 - 1.2 mg/dL   Bilirubin, Direct 0.2 0.0 - 0.2 mg/dL   Indirect Bilirubin 0.7 0.3 - 0.9 mg/dL    Comment: Performed at [facility], [address] [place], [place] [address]  Troponin I (High Sensitivity)     Status: Abnormal   Collection Time: 08/27/23 11:48 AM  Result Value Ref Range   Troponin I (High Sensitivity) 49 (H) <18 ng/L    Comment: (NOTE) Elevated high sensitivity troponin I (hsTnI) values and significant  changes across serial measurements may suggest ACS but many other  chronic and acute conditions are known to elevate hsTnI results.  Refer to the Links section for chest pain algorithms and additional  guidance. Performed  at [facility], [address] [place], [place] [address]   Magnesium     Status: None   Collection Time: 08/27/23 11:48 AM  Result Value Ref Range   Magnesium 1.9 1.7 - 2.4 mg/dL    Comment: Performed at [facility], [address], [place], [place] [address]  Resp panel by RT-PCR (RSV, Flu A&B, Covid) Anterior Nasal Swab     Status: None   Collection Time: 08/27/23  1:30 PM   Specimen: Anterior Nasal Swab  Result Value Ref Range   SARS Coronavirus 2 by RT PCR NEGATIVE NEGATIVE    Comment: (NOTE) SARS-CoV-2 target nucleic acids are NOT DETECTED.  The SARS-CoV-2 RNA is generally detectable in upper respiratory specimens during the acute phase of infection. The lowest concentration of SARS-CoV-2 viral copies this assay can detect is 138 copies/mL. A negative result does not preclude SARS-Cov-2 infection and should not be used as the sole basis for treatment or other patient management decisions. A negative result may occur with  improper specimen collection/handling, submission of specimen other than nasopharyngeal swab, presence of viral mutation(s) within the areas targeted by this assay, and inadequate number of viral copies(<138 copies/mL). A negative result must be combined with clinical observations, patient history, and epidemiological information. The expected result is Negative.  Fact Sheet for Patients:  BloggerCourse.com  Fact Sheet for Healthcare Providers:  SeriousBroker.it  This test is no t yet approved or cleared by the [place]  FDA and  has been authorized for detection and/or diagnosis of SARS-CoV-2 by FDA under an Emergency Use Authorization (EUA). This EUA will remain  in effect (meaning this test can be used) for the duration of the COVID-19 declaration under Section 564(b)(1) of the Act, 21 U.S.C.section 360bbb-3(b)(1), unless the authorization is terminated  or  revoked sooner.       Influenza A by PCR NEGATIVE NEGATIVE   Influenza B by PCR NEGATIVE NEGATIVE    Comment: (NOTE) The Xpert Xpress SARS-CoV-2/FLU/RSV plus assay is intended as an aid in the diagnosis of influenza from Nasopharyngeal swab specimens and should not be used as a sole basis for treatment. Nasal washings and aspirates are unacceptable for Xpert Xpress SARS-CoV-2/FLU/RSV testing.  Fact Sheet for Patients: BloggerCourse.com  Fact Sheet for Healthcare Providers: SeriousBroker.it  This test is not yet approved or cleared by the [place]  FDA and has been authorized for detection and/or diagnosis of SARS-CoV-2 by FDA under an Emergency Use Authorization (EUA). This EUA  will remain in effect (meaning this test can be used) for the duration of the COVID-19 declaration under Section 564(b)(1) of the Act, 21 U.S.C. section 360bbb-3(b)(1), unless the authorization is terminated or revoked.     Resp Syncytial Virus by PCR NEGATIVE NEGATIVE    Comment: (NOTE) Fact Sheet for Patients: BloggerCourse.com  Fact Sheet for Healthcare Providers: SeriousBroker.it  This test is not yet approved or cleared by the [place]  FDA and has been authorized for detection and/or diagnosis of SARS-CoV-2 by FDA under an Emergency Use Authorization (EUA). This EUA will remain in effect (meaning this test can be used) for the duration of the COVID-19 declaration under Section 564(b)(1) of the Act, 21 U.S.C. section 360bbb-3(b)(1), unless the authorization is terminated or revoked.  Performed at [facility], [address], [place], [place] [address]   I-Stat CG4 Lactic Acid     Status: None   Collection Time: 08/27/23  1:56 PM  Result Value Ref Range   Lactic Acid, Venous 1.0 0.5 - 1.9 mmol/L  Troponin I (High Sensitivity)     Status: Abnormal   Collection  Time: 08/27/23  3:40 PM  Result Value Ref Range   Troponin I (High Sensitivity) 47 (H) <18 ng/L    Comment: (NOTE) Elevated high sensitivity troponin I (hsTnI) values and significant  changes across serial measurements may suggest ACS but many other  chronic and acute conditions are known to elevate hsTnI results.  Refer to the Links section for chest pain algorithms and additional  guidance. Performed at [facility], [address] [place], [place] [address]   I-Stat CG4 Lactic Acid     Status: None   Collection Time: 08/27/23  3:46 PM  Result Value Ref Range   Lactic Acid, Venous 0.6 0.5 - 1.9 mmol/L   DG Chest Portable 1 View Result Date: 08/27/2023 EXAM: 1 VIEW XRAY OF THE CHEST 08/27/2023 12:36:00 PM COMPARISON: None available. CLINICAL HISTORY: Unwitnessed fall. Patient was found on floor covered in feces. Denies fall. States she slid out of bed but is confused about when this happened. Denies pain. History of multiple sclerosis, former smoker. FINDINGS: LUNGS AND PLEURA: No focal pulmonary opacity. No pulmonary edema. No pleural effusion. No pneumothorax. HEART AND MEDIASTINUM: No acute abnormality of the cardiac and mediastinal silhouettes. BONES AND SOFT TISSUES: No acute osseous abnormality. IMPRESSION: 1. No acute process. Electronically signed by: evalene coho 08/27/2023 01:28 PM EDT RP Workstation: [id number]   CT Cervical Spine Wo Contrast Result Date: 08/27/2023 EXAM: CT CERVICAL SPINE WITHOUT CONTRAST 08/27/2023 12:59:39 PM TECHNIQUE: CT of the cervical spine was performed without the administration of intravenous contrast. Multiplanar reformatted images are provided for review. Automated exposure control, iterative reconstruction, and/or weight based adjustment of the mA/kV was utilized to reduce the radiation dose to as low as reasonably achievable. COMPARISON: MRI of the cervical spine dated 12/17/2019. CLINICAL HISTORY: Neck trauma (Age >= 66y). Pt  from home via EMS. Missed a date for lunch yesterday. Family called to check on her today. Pt was found on floor covered in feces. Denies fall. States she slid out of bed but is confused about when this happened. FINDINGS: CERVICAL SPINE: BONES AND ALIGNMENT: There has been interval worsening of degenerative anterolisthesis at C6-7, from approximately 4 mm to 6 mm. The patient is again noted to be status post interbody fusion and anterior spinal fixation at C3-4, C4-5 and C5-6. There are hypertrophic changes involving the left lamina of C7. DEGENERATIVE CHANGES:  There is right sided facet arthrosis at C6-7. SOFT TISSUES: No prevertebral soft tissue swelling. VASCULATURE: There is moderate calcific plaque within the carotid bulbs bilaterally. IMPRESSION: 1. No acute abnormality of the cervical spine related to the reported neck trauma. 2. Interval worsening of degenerative anterolisthesis at C6-7, from approximately 4 mm to 6 mm, compared to prior MRI dated 12/17/2019. Electronically signed by: evalene coho 08/27/2023 01:27 PM EDT RP Workstation: [id number]   CT Head Wo Contrast Result Date: 08/27/2023 EXAM: CT HEAD WITHOUT CONTRAST 08/27/2023 12:59:39 PM TECHNIQUE: CT of the head was performed without the administration of intravenous contrast. Automated exposure control, iterative reconstruction, and/or weight based adjustment of the mA/kV was utilized to reduce the radiation dose to as low as reasonably achievable. COMPARISON: None available. CLINICAL HISTORY: Head trauma, minor (Age >= 66y). Pt from home via EMS. Missed a date for lunch yesterday. Family called to check on her today. Pt was found on floor covered in feces. Denies fall. States she slid out of bed but is confused about when this happened. FINDINGS: BRAIN AND VENTRICLES: No acute hemorrhage. Gray-white differentiation is preserved. No hydrocephalus. No extra-axial collection. No mass effect or midline shift. There is age commensurate cerebral  volume loss. ORBITS: No acute abnormality. SINUSES: No acute abnormality. SOFT TISSUES AND SKULL: No acute soft tissue abnormality. No skull fracture. VASCULATURE: There is moderate calcific atheromatous disease within the carotid siphons. IMPRESSION: 1. No acute intracranial abnormality. 2. Age commensurate cerebral volume loss. 3. Moderate calcific atheromatous disease within the carotid siphons. Electronically signed by: evalene coho 08/27/2023 01:21 PM EDT RP Workstation: [id number]    Pending Labs Unresulted Labs (From admission, onward)     Start     Ordered   08/27/23 1330  Urinalysis, w/ Reflex to Culture (Infection Suspected) -Urine, Clean Catch  Once,   URGENT       Question:  Specimen Source  Answer:  Urine, Clean Catch   08/27/23 1331            Vitals/Pain Today's Vitals   08/27/23 1155 08/27/23 1200 08/27/23 1600 08/27/23 1612  BP:  132/80 128/75   Pulse:  (!) 111 (!) 125   Resp:  16 (!) 23   Temp:  98.2 F (36.8 C)  98.5 F (36.9 C)  TempSrc:  Oral  Oral  SpO2:  100% 98%   PainSc: 0-No pain       Isolation Precautions Airborne and Contact precautions  Medications Medications  lactated ringers  bolus 1,000 mL (has no administration in time range)  sodium chloride  0.9 % bolus 1,000 mL (1,000 mLs Intravenous New Bag/Given 08/27/23 1400)    Mobility walks with person assist

## 2023-08-27 NOTE — ED Provider Notes (Signed)
 [place] EMERGENCY DEPARTMENT AT [facility] Provider Note   CSN: [id number] Arrival date & time: 08/27/23  1148     Patient presents with: Amy Bray is a 78 y.o. female.   78 year old female presents today for concern of a fall that occurred yesterday.  This occurred before 11 yesterday.  She remained on the floor until today when her neighbor discovered her.  States that she slid out of bed.  Denies any loss of consciousness or head injury.  Has history of MS.  Uses a scooter to get around home normally.  Uses walker outside of home.  She was unable to get up off of the floor.  Denies any pain anywhere.  She was covered in urine and feces on arrival.  No chest pain, cough, shortness of breath.  The history is provided by the patient. No language interpreter was used.       Prior to Admission medications   Medication Sig Start Date End Date Taking? Authorizing Provider  acetaminophen  (TYLENOL ) 500 MG tablet Take 500 mg by mouth as needed. 04/28/07   [provider]  baclofen  (LIORESAL ) 10 MG tablet TAKE TWO TABLETS BY MOUTH THREE TIMES DAILY AS NEEDED 04/14/23   Penumalli, Eduard SAUNDERS, MD  Cranberry-Vitamin C -Vitamin E  4200-20-3 MG-MG-UNIT CAPS Take by mouth 2 times daily at 12 noon and 4 pm. 2 tabs twice daily    [provider]  Dimethyl Fumarate  240 MG CPDR TAKE 1 CAPSULE BY MOUTH TWICE  DAILY 06/18/23   Penumalli, Vikram R, MD  lisinopril (PRINIVIL,ZESTRIL) 10 MG tablet Take 1 tablet by mouth daily. 04/27/07   [provider]  MYRBETRIQ  25 MG TB24 tablet Take by mouth. 03/14/21   [provider]  nortriptyline  (PAMELOR ) 50 MG capsule Take 1 capsule by mouth daily. 08/17/13   [provider]  oxybutynin  (DITROPAN -XL) 5 MG 24 hr tablet Take 5 mg by mouth daily.    [provider]  traMADol -acetaminophen  (ULTRACET ) 37.5-325 MG per tablet Take 1 tablet by mouth daily as needed. 11/17/13   [provider]     Allergies: Patient has no known allergies.    Review of Systems  Constitutional:  Negative for chills and fever.  Respiratory:  Negative for cough and shortness of breath.   Cardiovascular:  Negative for chest pain.  Neurological:  Negative for light-headedness.  All other systems reviewed and are negative.   Updated Vital Signs BP 128/75   Pulse (!) 125   Temp 98.5 F (36.9 C) (Oral)   Resp (!) 23   SpO2 98%   Physical Exam Vitals and nursing note reviewed.  Constitutional:      General: She is not in acute distress.    Appearance: Normal appearance. She is not ill-appearing.  HENT:     Head: Normocephalic and atraumatic.     Nose: Nose normal.  Eyes:     Conjunctiva/sclera: Conjunctivae normal.  Cardiovascular:     Rate and Rhythm: Normal rate and regular rhythm.  Pulmonary:     Effort: Pulmonary effort is normal. No respiratory distress.     Breath sounds: Normal breath sounds.  Abdominal:     General: There is no distension.     Palpations: Abdomen is soft.     Tenderness: There is no abdominal tenderness. There is no guarding.  Musculoskeletal:        General: No deformity. Normal range of motion.     Cervical back: Normal range of  motion.  Skin:    Findings: No rash.  Neurological:     Mental Status: She is alert.     (all labs ordered are listed, but only abnormal results are displayed) Labs Reviewed  BASIC METABOLIC PANEL WITH GFR - Abnormal; Notable for the following components:      Result Value   BUN 30 (*)    All other components within normal limits  CK - Abnormal; Notable for the following components:   Total CK 1,928 (*)    All other components within normal limits  HEPATIC FUNCTION PANEL - Abnormal; Notable for the following components:   Total Protein 6.2 (*)    Albumin 3.4 (*)    AST 69 (*)    All other components within normal limits  TROPONIN I (HIGH SENSITIVITY) - Abnormal; Notable for the following components:   Troponin I  (High Sensitivity) 49 (*)    All other components within normal limits  TROPONIN I (HIGH SENSITIVITY) - Abnormal; Notable for the following components:   Troponin I (High Sensitivity) 47 (*)    All other components within normal limits  RESP PANEL BY RT-PCR (RSV, FLU A&B, COVID)  RVPGX2  CBC  MAGNESIUM  URINALYSIS, W/ REFLEX TO CULTURE (INFECTION SUSPECTED)  I-STAT CG4 LACTIC ACID, ED  I-STAT CG4 LACTIC ACID, ED    EKG: None  Radiology: DG Chest Portable 1 View Result Date: 08/27/2023 EXAM: 1 VIEW XRAY OF THE CHEST 08/27/2023 12:36:00 PM COMPARISON: None available. CLINICAL HISTORY: Unwitnessed fall. Patient was found on floor covered in feces. Denies fall. States she slid out of bed but is confused about when this happened. Denies pain. History of multiple sclerosis, former smoker. FINDINGS: LUNGS AND PLEURA: No focal pulmonary opacity. No pulmonary edema. No pleural effusion. No pneumothorax. HEART AND MEDIASTINUM: No acute abnormality of the cardiac and mediastinal silhouettes. BONES AND SOFT TISSUES: No acute osseous abnormality. IMPRESSION: 1. No acute process. Electronically signed by: Amy Bray 08/27/2023 01:28 PM EDT RP Workstation: [id number]   CT Cervical Spine Wo Contrast Result Date: 08/27/2023 EXAM: CT CERVICAL SPINE WITHOUT CONTRAST 08/27/2023 12:59:39 PM TECHNIQUE: CT of the cervical spine was performed without the administration of intravenous contrast. Multiplanar reformatted images are provided for review. Automated exposure control, iterative reconstruction, and/or weight based adjustment of the mA/kV was utilized to reduce the radiation dose to as low as reasonably achievable. COMPARISON: MRI of the cervical spine dated 12/17/2019. CLINICAL HISTORY: Neck trauma (Age >= 65y). Pt from home via EMS. Missed a date for lunch yesterday. Family called to check on her today. Pt was found on floor covered in feces. Denies fall. States she slid out of bed but is confused about  when this happened. FINDINGS: CERVICAL SPINE: BONES AND ALIGNMENT: There has been interval worsening of degenerative anterolisthesis at C6-7, from approximately 4 mm to 6 mm. The patient is again noted to be status post interbody fusion and anterior spinal fixation at C3-4, C4-5 and C5-6. There are hypertrophic changes involving the left lamina of C7. DEGENERATIVE CHANGES: There is right sided facet arthrosis at C6-7. SOFT TISSUES: No prevertebral soft tissue swelling. VASCULATURE: There is moderate calcific plaque within the carotid bulbs bilaterally. IMPRESSION: 1. No acute abnormality of the cervical spine related to the reported neck trauma. 2. Interval worsening of degenerative anterolisthesis at C6-7, from approximately 4 mm to 6 mm, compared to prior MRI dated 12/17/2019. Electronically signed by: Amy Bray 08/27/2023 01:27 PM EDT RP Workstation: [id number]   CT Head Wo Contrast  Result Date: 08/27/2023 EXAM: CT HEAD WITHOUT CONTRAST 08/27/2023 12:59:39 PM TECHNIQUE: CT of the head was performed without the administration of intravenous contrast. Automated exposure control, iterative reconstruction, and/or weight based adjustment of the mA/kV was utilized to reduce the radiation dose to as low as reasonably achievable. COMPARISON: None available. CLINICAL HISTORY: Head trauma, minor (Age >= 65y). Pt from home via EMS. Missed a date for lunch yesterday. Family called to check on her today. Pt was found on floor covered in feces. Denies fall. States she slid out of bed but is confused about when this happened. FINDINGS: BRAIN AND VENTRICLES: No acute hemorrhage. Gray-white differentiation is preserved. No hydrocephalus. No extra-axial collection. No mass effect or midline shift. There is age commensurate cerebral volume loss. ORBITS: No acute abnormality. SINUSES: No acute abnormality. SOFT TISSUES AND SKULL: No acute soft tissue abnormality. No skull fracture. VASCULATURE: There is moderate calcific  atheromatous disease within the carotid siphons. IMPRESSION: 1. No acute intracranial abnormality. 2. Age commensurate cerebral volume loss. 3. Moderate calcific atheromatous disease within the carotid siphons. Electronically signed by: Amy Bray 08/27/2023 01:21 PM EDT RP Workstation: [id number]     Procedures   Medications Ordered in the ED  lactated ringers  bolus 1,000 mL (has no administration in time range)  lactated ringers  bolus 1,000 mL (has no administration in time range)  sodium chloride  0.9 % bolus 1,000 mL (1,000 mLs Intravenous New Bag/Given 08/27/23 1400)    Clinical Course as of 08/27/23 1721  Thu Aug 27, 2023  1229 Basic metabolic panel [IB]    Clinical Course User Index [IB] Bray, Amy Seashore                                 Medical Decision Making Amount and/or Complexity of Data Reviewed Labs: ordered. Radiology: ordered.   Medical Decision Making / ED Course   This patient presents to the ED for concern of fall, remained on the floor, this involves an extensive number of treatment options, and is a complaint that carries with it a high risk of complications and morbidity.  The differential diagnosis includes CKD, head injury, fracture, contusion  MDM: 78 year old female presents today for above-mentioned complaints. She appears chronically ill-appearing.  She is brought in by her neighbor who provides some history as well.  CBC is unremarkable, BMP shows BUN of 30 otherwise no acute concern. CK of 1900, troponin initially 49, repeat 47.  EKG without acute ischemic changes.  No suspicion for ACS.  She is without chest pain, or other anginal symptoms.  Hepatic function panel without acute concern.  Respiratory panel negative.  Chest x-ray without acute cardiopulmonary process.  CT head and CT C-spine without acute concern.  Discussed with hospitalist for admission for rhabdo, weakness, and dehydration. They will evaluate for  admission.  Lab Tests: -I ordered, reviewed, and interpreted labs.   The pertinent results include:   Labs Reviewed  BASIC METABOLIC PANEL WITH GFR - Abnormal; Notable for the following components:      Result Value   BUN 30 (*)    All other components within normal limits  CK - Abnormal; Notable for the following components:   Total CK 1,928 (*)    All other components within normal limits  HEPATIC FUNCTION PANEL - Abnormal; Notable for the following components:   Total Protein 6.2 (*)    Albumin 3.4 (*)    AST 69 (*)  All other components within normal limits  TROPONIN I (HIGH SENSITIVITY) - Abnormal; Notable for the following components:   Troponin I (High Sensitivity) 49 (*)    All other components within normal limits  TROPONIN I (HIGH SENSITIVITY) - Abnormal; Notable for the following components:   Troponin I (High Sensitivity) 47 (*)    All other components within normal limits  RESP PANEL BY RT-PCR (RSV, FLU A&B, COVID)  RVPGX2  CBC  MAGNESIUM  URINALYSIS, W/ REFLEX TO CULTURE (INFECTION SUSPECTED)  I-STAT CG4 LACTIC ACID, ED  I-STAT CG4 LACTIC ACID, ED      EKG  EKG Interpretation Date/Time:    Ventricular Rate:    PR Interval:    QRS Duration:    QT Interval:    QTC Calculation:   R Axis:      Text Interpretation:           Imaging Studies ordered: I ordered imaging studies including chest x-ray, CT head, CT C-spine I independently visualized and interpreted imaging. I agree with the radiologist interpretation   Medicines ordered and prescription drug management: Meds ordered this encounter  Medications   sodium chloride  0.9 % bolus 1,000 mL   DISCONTD: lactated ringers  bolus 1,000 mL   lactated ringers  bolus 1,000 mL    -I have reviewed the patients home medicines and have made adjustments as needed   Reevaluation: After the interventions noted above, I reevaluated the patient and found that they have :stayed the same  Co morbidities  that complicate the patient evaluation  Past Medical History:  Diagnosis Date   Cervical myelopathy (HCC)    Multiple sclerosis (HCC)    Osteoporosis 2018   hip      Dispostion: Discussed with hospitalist.  They will evaluate patient for admission.   Final diagnoses:  Dehydration  Traumatic rhabdomyolysis, initial encounter [facility])  Fall in home, initial encounter    ED Discharge Orders     None          Hildegard Loge, [place] 08/27/23 1743    Armenta Canning, MD 08/28/23 [phone]

## 2023-08-27 NOTE — ED Triage Notes (Signed)
 Pt from home via EMS.  Missed a date for lunch yesterday.  Family called to check on her today.  Pt was found on floor covered in feces.  Denies fall.  States she slid out of bed but is confused about when this happened.  Otherwise alert and answers questions appropriately.  CBG 123.  90 palp.  18G LAC  750 of LR given with EMS.  No obvious injury.  C collar in place d/t confusion about events. No complaints of pain.

## 2023-08-28 DIAGNOSIS — M6282 Rhabdomyolysis: Secondary | ICD-10-CM | POA: Diagnosis not present

## 2023-08-28 DIAGNOSIS — T796XXA Traumatic ischemia of muscle, initial encounter: Secondary | ICD-10-CM | POA: Diagnosis not present

## 2023-08-28 LAB — BASIC METABOLIC PANEL WITH GFR
Anion gap: 11 (ref 5–15)
BUN: 24 mg/dL — ABNORMAL HIGH (ref 8–23)
CO2: 20 mmol/L — ABNORMAL LOW (ref 22–32)
Calcium: 7.8 mg/dL — ABNORMAL LOW (ref 8.9–10.3)
Chloride: 110 mmol/L (ref 98–111)
Creatinine, Ser: 0.59 mg/dL (ref 0.44–1.00)
GFR, Estimated: >60 mL/min (ref >60)
Glucose, Bld: 87 mg/dL (ref 70–99)
Potassium: 3.7 mmol/L (ref 3.5–5.1)
Sodium: 141 mmol/L (ref 135–145)

## 2023-08-28 LAB — CK: Total CK: 682 U/L — ABNORMAL HIGH (ref 38–234)

## 2023-08-28 MED ORDER — ORAL CARE MOUTH RINSE: 15.0000 mL | OROMUCOSAL | Status: DC | PRN

## 2023-08-28 NOTE — TOC Transition Note (Signed)
 Transition of Care [facility]) - Discharge Note   Patient Details  Name: Amy Bray MRN: [id number] Date of Birth: [birth year]  [facility] CM/SW Contact:  Bascom Service, RN Phone Number: 08/28/2023, 11:14 AM   Clinical Narrative:  d/c home w/[facility]-no preference rep Jon aware. Has own transport. Home. No further CM needs.     Final next level of care: Home w Home Health Services Barriers to Discharge: No Barriers Identified   Patient Goals and CMS Choice   CMS Medicare.gov Compare Post Acute Care list provided to:: Patient Choice offered to / list presented to : Patient  ownership interest in [facility].provided to:: Patient    Discharge Placement                       Discharge Plan and Services Additional resources added to the After Visit Summary for     Discharge Planning Services: CM Consult Post Acute Care Choice: Home Health                    HH Arranged: PT [facility] Agency: [facility] Date [facility] Agency Contacted: 08/28/23 Time HH Agency Contacted: 1114 Representative spoke with at [facility] Agency: Jon  Social Drivers of Health (SDOH) Interventions SDOH Screenings   Food Insecurity: No Food Insecurity (08/27/2023)  Housing: Low Risk  (08/27/2023)  Transportation Needs: No Transportation Needs (08/27/2023)  Utilities: Not At Risk (08/27/2023)  Social Connections: Socially Isolated (08/27/2023)  Tobacco Use: Medium Risk (08/27/2023)     Readmission Risk Interventions     No data to display

## 2023-08-28 NOTE — Evaluation (Signed)
 Occupational Therapy Evaluation Patient Details Name: Amy Bray MRN: [id number] DOB: 05-15-1945 Today's Date: 08/28/2023   History of Present Illness   Pt is a 78 y/o female presenting after being found on floor at home after sliding from bed. Found to have traumatic rhabdomyolysis, mild troponinemia and dehydration. PMH: MS, HTN, cervical myelopathy.     Clinical Impressions PTA, pt lives alone, reports typically Modified Independent with ADLs, household IADLs and transfers to/from electric scooter in which pt uses for mobility in the home. Pt reports ordering most groceries, medicines, etc at home but drives to aquatic center for pool activities 2x/week. Pt presents now with deficits in strength, sitting/standing balance and endurance. Pt requires Mod A for bed mobility, Mod A to stand but once up, able to pivot using RW with CGA. Pt requires Setup for UB ADL and Min-Mod A for LB ADLs. Pt citing increased assist d/t fatigue. Feel pt would benefit from continued inpatient follow up therapy, <3 hours/day at DC but pt politely declined, endorsing preference for HHOT. Encouraged to have someone stay with her 1-2 days after DC or frequent phone check ins at a minimum; also encouraged pt to activate her medical alert necklace as well as have her phone on person at all times. Will continue to follow while admitted.  HR 110s with activity Orthostatic vitals negative     If plan is discharge home, recommend the following:   A little help with walking and/or transfers;A little help with bathing/dressing/bathroom;Assistance with cooking/housework     Functional Status Assessment   Patient has had a recent decline in their functional status and demonstrates the ability to make significant improvements in function in a reasonable and predictable amount of time.     Equipment Recommendations   Other (comment) (bedrail attachments; pt to look online)     Recommendations for Other  Services         Precautions/Restrictions   Precautions Precautions: Fall Restrictions Weight Bearing Restrictions Per Provider Order: No     Mobility Bed Mobility Overal bed mobility: Needs Assistance Bed Mobility: Supine to Sit     Supine to sit: Mod assist, Used rails     General bed mobility comments: reports usually able to push up from elbow to get up, unable to do so this AM- heavy use of bedrails (educated on looking at bedrail options on [place]). Assist to scoot hips, bring LE fully off of bed and lift trunk with handheld assist    Transfers Overall transfer level: Needs assistance Equipment used: Rolling walker (2 wheels) Transfers: Sit to/from Stand, Bed to chair/wheelchair/BSC Sit to Stand: Mod assist     Step pivot transfers: Contact guard assist     General transfer comment: Mod A to stand from bedside, once up, able to step to recliner using RW with CGA. Slow pace and difficulty stepping      Balance Overall balance assessment: Needs assistance Sitting-balance support: No upper extremity supported, Feet supported Sitting balance-Leahy Scale: Fair     Standing balance support: Bilateral upper extremity supported, During functional activity Standing balance-Leahy Scale: Poor                             ADL either performed or assessed with clinical judgement   ADL Overall ADL's : Needs assistance/impaired Eating/Feeding: Independent   Grooming: Set up;Sitting   Upper Body Bathing: Set up;Sitting   Lower Body Bathing: Moderate assistance;Sit to/from stand;Sitting/lateral leans  Upper Body Dressing : Set up;Sitting   Lower Body Dressing: Moderate assistance;Sitting/lateral leans;Sit to/from stand   Toilet Transfer: Moderate assistance;Minimal assistance;Stand-pivot;BSC/3in1;Rolling walker (2 wheels)   Toileting- Clothing Manipulation and Hygiene: Minimal assistance;Sitting/lateral lean;Sit to/from stand         General ADL  Comments: Discussed carrying phone on person around home when alone (pt reports she usually does but phone was in bathroom at time of fall out of bed), strongly encouraged activating medical alert necklace that pt has had for 2 years to maximize safety and prevent prolonged time on ground. Inquired if family able to stay for 1-2 nights intially to ensure moving well enough to be alone though pt reported likely not possible; encouraged a minimum of frequent calls/check ins and if not heard from in certain timeframe then family, friends or neighbors will know to check in on pt. Pt declines postacute rehab stay     Vision Baseline Vision/History: 1 Wears glasses Ability to See in Adequate Light: 0 Adequate Patient Visual Report: No change from baseline Vision Assessment?: Wears glasses for reading;No apparent visual deficits     Perception         Praxis         Pertinent Vitals/Pain Pain Assessment Pain Assessment: No/denies pain     Extremity/Trunk Assessment Upper Extremity Assessment Upper Extremity Assessment: Generalized weakness;Right hand dominant   Lower Extremity Assessment Lower Extremity Assessment: Defer to PT evaluation   Cervical / Trunk Assessment Cervical / Trunk Assessment: Kyphotic   Communication Communication Communication: No apparent difficulties   Cognition Arousal: Alert Behavior During Therapy: WFL for tasks assessed/performed Cognition: No family/caregiver present to determine baseline             OT - Cognition Comments: appears close to baseline; some decreased insight into current deficits and translation to home environment. also reports being tired                 Following commands: Intact       Cueing  General Comments   Cueing Techniques: Verbal cues  BP supine: 125/88  BP sitting: 118/74  BP after standing: 121/75   Exercises     Shoulder Instructions      Home Living Family/patient expects to be discharged to::  Private residence Living Arrangements: Alone Available Help at Discharge: Family;Friend(s);Neighbor;Available PRN/intermittently Type of Home: House Home Access: Level entry     Home Layout: One level     Bathroom Shower/Tub: Producer, television/film/video: Handicapped height Bathroom Accessibility: Yes How Accessible: Accessible via wheelchair Home Equipment: Agricultural consultant (2 wheels);Electric scooter          Prior Functioning/Environment Prior Level of Function : Independent/Modified Independent;History of Falls (last six months)             Mobility Comments: uses scooter for mobility in the home, RW when outside of the home. pt reports one other slide out of bed 2 years ago but no other falls ADLs Comments: Reports Mod I for ADLs, standing for showers. Orders groceries, medicines, etc online. Drives self to aquatic center for aquatic exercises 2x/week    OT Problem List: Decreased strength;Decreased activity tolerance;Impaired balance (sitting and/or standing);Decreased knowledge of use of DME or AE   OT Treatment/Interventions: Self-care/ADL training;Therapeutic exercise;Energy conservation;DME and/or AE instruction;Therapeutic activities;Patient/family education;Balance training      OT Goals(Current goals can be found in the care plan section)   Acute Rehab OT Goals Patient Stated Goal: go home and have therapy  come to her house OT Goal Formulation: With patient Time For Goal Achievement: 09/11/23 Potential to Achieve Goals: Good   OT Frequency:  Min 2X/week    Co-evaluation              AM-PAC OT 6 Clicks Daily Activity     Outcome Measure Help from another person eating meals?: None Help from another person taking care of personal grooming?: A Little Help from another person toileting, which includes using toliet, bedpan, or urinal?: A Little Help from another person bathing (including washing, rinsing, drying)?: A Lot Help from another  person to put on and taking off regular upper body clothing?: A Little Help from another person to put on and taking off regular lower body clothing?: A Lot 6 Click Score: 17   End of Session Equipment Utilized During Treatment: Gait belt;Rolling walker (2 wheels) Nurse Communication: Mobility status  Activity Tolerance: Patient tolerated treatment well Patient left: in chair;with call bell/phone within reach;Other (comment) (chair alarm box not working- notified NT that likely new batteries needed)  OT Visit Diagnosis: Unsteadiness on feet (R26.81);Other abnormalities of gait and mobility (R26.89);Muscle weakness (generalized) (M62.81)                Time: 9284-9257 OT Time Calculation (min): 27 min Charges:  OT General Charges $OT Visit: 1 Visit OT Evaluation $OT Eval Low Complexity: 1 Low OT Treatments $Self Care/Home Management : 8-22 mins  Mliss NOVAK, OTR/L Acute Rehab Services Office: [phone]   Mliss Fish 08/28/2023, 8:01 AM

## 2023-08-28 NOTE — Discharge Summary (Signed)
 Physician Discharge Summary  Amy Bray FMW:[id number] DOB: 02-Aug-1945 DOA: 08/27/2023  PCP: Marvene Prentice SAUNDERS, FNP  Admit date: 08/27/2023 Discharge date: 08/28/2023  Admitted From: Home Disposition:  Home  Discharge Condition:Stable CODE STATUS:FULL Diet recommendation:  Regular / Dysphagia   Brief/Interim Summary: Amy Bray is a 78 y.o. female with medical history significant of essential hypertension, multiple sclerosis with lower extremity weakness, walks with a walker inside the house, uses motorized wheelchair to get around and lives at home alone.  Yesterday afternoon, she slid off the bed and landed on her buttock on the floor while trying to get out of the bed and could not get up.  She could not reach to the phone.  She laid in the floor hungry and thirsty and also defecated on the floor.  She was supposed to go to lunch date yesterday, however she did not show up so her neighbor went to check on her and found her on the floor covered in urine and feces on arrival.  Patient was also slightly confused on EMS arrival otherwise herself denied any complaints.  She was tired of laying in the floor.  She never lost consciousness.  She denied any pain in her body.  Denies any recent fever or chills nausea vomiting.  She has been in her usual self until the fall.  Currently denies any headache, nausea, neck pain or incontinence.  On prednisone, she was in sinus tachycardia.Twelve-lead EKG shows sinus tachycardia. No acute ST-T wave changes .Skeletal survey including CT scan of the head, CT scan of the cervical spine and chest x-ray is normal. CK was 2000.  Started on IV fluids. Patient seen and examined at the bedside this morning.  She was working with PT and OT.  She was completely alert and oriented.  PT recommended SNF initially but she wants to go home .  She formally declined SNF.  She says she is ready to go home today.  She ambulated with the help of walker.  We recommend her to  follow-up with her PCP next week.  Following problems were addressed during the hospitalization:   Mechanical fall with traumatic rhabdomyolysis: Currently hemodynamically stable.  Renal functions are adequate.  Received IV fluids.  CK level trended down.  She was evaluated with physical therapy today.  Initially recommended SNF but seems to go home.  Home health arranged.  I have requested her to hydrate herself at home  Elevated troponins: mild troponinemia without any EKG changes and no chest pain.  Likely from rhabdomyolysis.  No chest pain.  EKG without any ischemic changes   MS with ambulatory dysfunction: On baclofen , Ultram .  Continue.    Essential hypertension: BP stable.  Will hold lisinopril on discharge      Discharge Diagnoses:  Principal Problem:   Rhabdomyolysis    Discharge Instructions  Discharge Instructions     Diet general   Complete by: As directed    Discharge instructions   Complete by: As directed    1)Please take your medications as instructed 2)Follo follow-up with your PCP next week 3) Continue to hydrate yourself at home   Increase activity slowly   Complete by: As directed    No wound care   Complete by: As directed       Allergies as of 08/28/2023   No Known Allergies      Medication List     STOP taking these medications    lisinopril 10 MG tablet Commonly known as:  ZESTRIL       TAKE these medications    acetaminophen  500 MG tablet Commonly known as: TYLENOL  Take 500 mg by mouth as needed for moderate pain (pain score 4-6).   baclofen  10 MG tablet Commonly known as: LIORESAL  TAKE TWO TABLETS BY MOUTH THREE TIMES DAILY AS NEEDED What changed:  how much to take how to take this when to take this   Cranberry-Vitamin C -Vitamin E  4200-20-3 MG-MG-UNIT Caps Take by mouth 2 times daily at 12 noon and 4 pm. 2 tabs twice daily   Dimethyl Fumarate  240 MG Cpdr TAKE 1 CAPSULE BY MOUTH TWICE  DAILY   Myrbetriq  25 MG Tb24  tablet Generic drug: mirabegron  ER Take 25 mg by mouth daily.   nortriptyline  50 MG capsule Commonly known as: PAMELOR  Take 1 capsule by mouth at bedtime.   oxybutynin  5 MG 24 hr tablet Commonly known as: DITROPAN -XL Take 5 mg by mouth daily.   traMADol -acetaminophen  37.5-325 MG tablet Commonly known as: ULTRACET  Take 1 tablet by mouth daily as needed for moderate pain (pain score 4-6).        Follow-up Information     Marvene Prentice SAUNDERS, FNP. Schedule an appointment as soon as possible for a visit in 1 week(s).   Specialty: Family Medicine Contact information: [address] [place] [address] [phone]                No Known Allergies  Consultations: None   Procedures/Studies: DG Chest Portable 1 View Result Date: 08/27/2023 EXAM: 1 VIEW XRAY OF THE CHEST 08/27/2023 12:36:00 PM COMPARISON: None available. CLINICAL HISTORY: Unwitnessed fall. Patient was found on floor covered in feces. Denies fall. States she slid out of bed but is confused about when this happened. Denies pain. History of multiple sclerosis, former smoker. FINDINGS: LUNGS AND PLEURA: No focal pulmonary opacity. No pulmonary edema. No pleural effusion. No pneumothorax. HEART AND MEDIASTINUM: No acute abnormality of the cardiac and mediastinal silhouettes. BONES AND SOFT TISSUES: No acute osseous abnormality. IMPRESSION: 1. No acute process. Electronically signed by: evalene coho 08/27/2023 01:28 PM EDT RP Workstation: [id number]   CT Cervical Spine Wo Contrast Result Date: 08/27/2023 EXAM: CT CERVICAL SPINE WITHOUT CONTRAST 08/27/2023 12:59:39 PM TECHNIQUE: CT of the cervical spine was performed without the administration of intravenous contrast. Multiplanar reformatted images are provided for review. Automated exposure control, iterative reconstruction, and/or weight based adjustment of the mA/kV was utilized to reduce the radiation dose to as low as reasonably achievable. COMPARISON: MRI of  the cervical spine dated 12/17/2019. CLINICAL HISTORY: Neck trauma (Age >= 65y). Pt from home via EMS. Missed a date for lunch yesterday. Family called to check on her today. Pt was found on floor covered in feces. Denies fall. States she slid out of bed but is confused about when this happened. FINDINGS: CERVICAL SPINE: BONES AND ALIGNMENT: There has been interval worsening of degenerative anterolisthesis at C6-7, from approximately 4 mm to 6 mm. The patient is again noted to be status post interbody fusion and anterior spinal fixation at C3-4, C4-5 and C5-6. There are hypertrophic changes involving the left lamina of C7. DEGENERATIVE CHANGES: There is right sided facet arthrosis at C6-7. SOFT TISSUES: No prevertebral soft tissue swelling. VASCULATURE: There is moderate calcific plaque within the carotid bulbs bilaterally. IMPRESSION: 1. No acute abnormality of the cervical spine related to the reported neck trauma. 2. Interval worsening of degenerative anterolisthesis at C6-7, from approximately 4 mm to 6 mm, compared to prior MRI  dated 12/17/2019. Electronically signed by: evalene coho 08/27/2023 01:27 PM EDT RP Workstation: [id number]   CT Head Wo Contrast Result Date: 08/27/2023 EXAM: CT HEAD WITHOUT CONTRAST 08/27/2023 12:59:39 PM TECHNIQUE: CT of the head was performed without the administration of intravenous contrast. Automated exposure control, iterative reconstruction, and/or weight based adjustment of the mA/kV was utilized to reduce the radiation dose to as low as reasonably achievable. COMPARISON: None available. CLINICAL HISTORY: Head trauma, minor (Age >= 65y). Pt from home via EMS. Missed a date for lunch yesterday. Family called to check on her today. Pt was found on floor covered in feces. Denies fall. States she slid out of bed but is confused about when this happened. FINDINGS: BRAIN AND VENTRICLES: No acute hemorrhage. Gray-white differentiation is preserved. No hydrocephalus. No  extra-axial collection. No mass effect or midline shift. There is age commensurate cerebral volume loss. ORBITS: No acute abnormality. SINUSES: No acute abnormality. SOFT TISSUES AND SKULL: No acute soft tissue abnormality. No skull fracture. VASCULATURE: There is moderate calcific atheromatous disease within the carotid siphons. IMPRESSION: 1. No acute intracranial abnormality. 2. Age commensurate cerebral volume loss. 3. Moderate calcific atheromatous disease within the carotid siphons. Electronically signed by: evalene coho 08/27/2023 01:21 PM EDT RP Workstation: [id number]      Subjective: Patient seen and examined at bedside today.  Hemodynamically stable.  Comfortable.  Medically stable for discharge to home.  Declined SNF.  Discharge Exam: Vitals:   08/28/23 0421 08/28/23 0825  BP: 113/71 118/71  Pulse: (!) 110 (!) 108  Resp: 18 14  Temp: (!) 97.5 F (36.4 C) 98 F (36.7 C)  SpO2: 95% 98%   Vitals:   08/27/23 2218 08/27/23 2354 08/28/23 0421 08/28/23 0825  BP: 118/66 115/68 113/71 118/71  Pulse: (!) 125 (!) 123 (!) 110 (!) 108  Resp: 18 18 18 14   Temp: 98.4 F (36.9 C) 98.4 F (36.9 C) (!) 97.5 F (36.4 C) 98 F (36.7 C)  TempSrc: Oral Oral Oral Oral  SpO2: 98% 96% 95% 98%  Weight:      Height:        General: Pt is alert, awake, not in acute distress, chronically deconditioned Cardiovascular: RRR, S1/S2 +, no rubs, no gallops Respiratory: CTA bilaterally, no wheezing, no rhonchi Abdominal: Soft, NT, ND, bowel sounds + Extremities: no edema, no cyanosis    The results of significant diagnostics from this hospitalization (including imaging, microbiology, ancillary and laboratory) are listed below for reference.     Microbiology: Recent Results (from the past 240 hours)  Resp panel by RT-PCR (RSV, Flu A&B, Covid) Anterior Nasal Swab     Status: None   Collection Time: 08/27/23  1:30 PM   Specimen: Anterior Nasal Swab  Result Value Ref Range Status   SARS  Coronavirus 2 by RT PCR NEGATIVE NEGATIVE Final    Comment: (NOTE) SARS-CoV-2 target nucleic acids are NOT DETECTED.  The SARS-CoV-2 RNA is generally detectable in upper respiratory specimens during the acute phase of infection. The lowest concentration of SARS-CoV-2 viral copies this assay can detect is 138 copies/mL. A negative result does not preclude SARS-Cov-2 infection and should not be used as the sole basis for treatment or other patient management decisions. A negative result may occur with  improper specimen collection/handling, submission of specimen other than nasopharyngeal swab, presence of viral mutation(s) within the areas targeted by this assay, and inadequate number of viral copies(<138 copies/mL). A negative result must be combined with clinical observations, patient history, and epidemiological  information. The expected result is Negative.  Fact Sheet for Patients:  BloggerCourse.com  Fact Sheet for Healthcare Providers:  SeriousBroker.it  This test is no t yet approved or cleared by the [place]  FDA and  has been authorized for detection and/or diagnosis of SARS-CoV-2 by FDA under an Emergency Use Authorization (EUA). This EUA will remain  in effect (meaning this test can be used) for the duration of the COVID-19 declaration under Section 564(b)(1) of the Act, 21 U.S.C.section 360bbb-3(b)(1), unless the authorization is terminated  or revoked sooner.       Influenza A by PCR NEGATIVE NEGATIVE Final   Influenza B by PCR NEGATIVE NEGATIVE Final    Comment: (NOTE) The Xpert Xpress SARS-CoV-2/FLU/RSV plus assay is intended as an aid in the diagnosis of influenza from Nasopharyngeal swab specimens and should not be used as a sole basis for treatment. Nasal washings and aspirates are unacceptable for Xpert Xpress SARS-CoV-2/FLU/RSV testing.  Fact Sheet for  Patients: BloggerCourse.com  Fact Sheet for Healthcare Providers: SeriousBroker.it  This test is not yet approved or cleared by the [place]  FDA and has been authorized for detection and/or diagnosis of SARS-CoV-2 by FDA under an Emergency Use Authorization (EUA). This EUA will remain in effect (meaning this test can be used) for the duration of the COVID-19 declaration under Section 564(b)(1) of the Act, 21 U.S.C. section 360bbb-3(b)(1), unless the authorization is terminated or revoked.     Resp Syncytial Virus by PCR NEGATIVE NEGATIVE Final    Comment: (NOTE) Fact Sheet for Patients: BloggerCourse.com  Fact Sheet for Healthcare Providers: SeriousBroker.it  This test is not yet approved or cleared by the [place]  FDA and has been authorized for detection and/or diagnosis of SARS-CoV-2 by FDA under an Emergency Use Authorization (EUA). This EUA will remain in effect (meaning this test can be used) for the duration of the COVID-19 declaration under Section 564(b)(1) of the Act, 21 U.S.C. section 360bbb-3(b)(1), unless the authorization is terminated or revoked.  Performed at [facility], [address] [place], [place] [address]      Labs: BNP (last 3 results) No results for input(s): BNP in the last 8760 hours. Basic Metabolic Panel: Recent Labs  Lab 08/27/23 1148 08/28/23 0424  NA 141 141  K 4.1 3.7  CL 104 110  CO2 22 20*  GLUCOSE 93 87  BUN 30* 24*  CREATININE 0.69 0.59  CALCIUM 9.2 7.8*  MG 1.9  --    Liver Function Tests: Recent Labs  Lab 08/27/23 1148  AST 69*  ALT 34  ALKPHOS 38  BILITOT 0.9  PROT 6.2*  ALBUMIN 3.4*   No results for input(s): LIPASE, AMYLASE in the last 168 hours. No results for input(s): AMMONIA in the last 168 hours. CBC: Recent Labs  Lab 08/27/23 1148  WBC 9.7  HGB 12.4  HCT 38.7   MCV 98.0  PLT 233   Cardiac Enzymes: Recent Labs  Lab 08/27/23 1148 08/28/23 0424  CKTOTAL 1,928* 682*   BNP: Invalid input(s): POCBNP CBG: No results for input(s): GLUCAP in the last 168 hours. D-Dimer No results for input(s): DDIMER in the last 72 hours. Hgb A1c No results for input(s): HGBA1C in the last 72 hours. Lipid Profile No results for input(s): CHOL, HDL, LDLCALC, TRIG, CHOLHDL, LDLDIRECT in the last 72 hours. Thyroid  function studies No results for input(s): TSH, T4TOTAL, T3FREE, THYROIDAB in the last 72 hours.  Invalid input(s): FREET3 Anemia work up No results for input(s): VITAMINB12,  FOLATE, FERRITIN, TIBC, IRON, RETICCTPCT in the last 72 hours. Urinalysis No results found for: COLORURINE, APPEARANCEUR, LABSPEC, PHURINE, GLUCOSEU, HGBUR, BILIRUBINUR, KETONESUR, PROTEINUR, UROBILINOGEN, NITRITE, LEUKOCYTESUR Sepsis Labs Recent Labs  Lab 08/27/23 1148  WBC 9.7   Microbiology Recent Results (from the past 240 hours)  Resp panel by RT-PCR (RSV, Flu A&B, Covid) Anterior Nasal Swab     Status: None   Collection Time: 08/27/23  1:30 PM   Specimen: Anterior Nasal Swab  Result Value Ref Range Status   SARS Coronavirus 2 by RT PCR NEGATIVE NEGATIVE Final    Comment: (NOTE) SARS-CoV-2 target nucleic acids are NOT DETECTED.  The SARS-CoV-2 RNA is generally detectable in upper respiratory specimens during the acute phase of infection. The lowest concentration of SARS-CoV-2 viral copies this assay can detect is 138 copies/mL. A negative result does not preclude SARS-Cov-2 infection and should not be used as the sole basis for treatment or other patient management decisions. A negative result may occur with  improper specimen collection/handling, submission of specimen other than nasopharyngeal swab, presence of viral mutation(s) within the areas targeted by this assay, and inadequate number of  viral copies(<138 copies/mL). A negative result must be combined with clinical observations, patient history, and epidemiological information. The expected result is Negative.  Fact Sheet for Patients:  BloggerCourse.com  Fact Sheet for Healthcare Providers:  SeriousBroker.it  This test is no t yet approved or cleared by the [place]  FDA and  has been authorized for detection and/or diagnosis of SARS-CoV-2 by FDA under an Emergency Use Authorization (EUA). This EUA will remain  in effect (meaning this test can be used) for the duration of the COVID-19 declaration under Section 564(b)(1) of the Act, 21 U.S.C.section 360bbb-3(b)(1), unless the authorization is terminated  or revoked sooner.       Influenza A by PCR NEGATIVE NEGATIVE Final   Influenza B by PCR NEGATIVE NEGATIVE Final    Comment: (NOTE) The Xpert Xpress SARS-CoV-2/FLU/RSV plus assay is intended as an aid in the diagnosis of influenza from Nasopharyngeal swab specimens and should not be used as a sole basis for treatment. Nasal washings and aspirates are unacceptable for Xpert Xpress SARS-CoV-2/FLU/RSV testing.  Fact Sheet for Patients: BloggerCourse.com  Fact Sheet for Healthcare Providers: SeriousBroker.it  This test is not yet approved or cleared by the [place]  FDA and has been authorized for detection and/or diagnosis of SARS-CoV-2 by FDA under an Emergency Use Authorization (EUA). This EUA will remain in effect (meaning this test can be used) for the duration of the COVID-19 declaration under Section 564(b)(1) of the Act, 21 U.S.C. section 360bbb-3(b)(1), unless the authorization is terminated or revoked.     Resp Syncytial Virus by PCR NEGATIVE NEGATIVE Final    Comment: (NOTE) Fact Sheet for Patients: BloggerCourse.com  Fact Sheet for Healthcare  Providers: SeriousBroker.it  This test is not yet approved or cleared by the [place]  FDA and has been authorized for detection and/or diagnosis of SARS-CoV-2 by FDA under an Emergency Use Authorization (EUA). This EUA will remain in effect (meaning this test can be used) for the duration of the COVID-19 declaration under Section 564(b)(1) of the Act, 21 U.S.C. section 360bbb-3(b)(1), unless the authorization is terminated or revoked.  Performed at [facility], [address], [place], [place] [address]     Please note: You were cared for by a hospitalist during your hospital stay. Once you are discharged, your primary care physician will handle any further medical issues. Please  note that NO REFILLS for any discharge medications will be authorized once you are discharged, as it is imperative that you return to your primary care physician (or establish a relationship with a primary care physician if you do not have one) for your post hospital discharge needs so that they can reassess your need for medications and monitor your lab values.    Time coordinating discharge: 40 minutes  SIGNED:   Ivonne Mustache, MD  [facility] 08/28/2023, 10:33 AM Pager [phone]  If 7PM-7AM, please contact night-coverage [email or web address] Password [email or web address]

## 2023-08-28 NOTE — Plan of Care (Signed)

## 2023-08-28 NOTE — Evaluation (Signed)
 Physical Therapy Evaluation Patient Details Name: Amy Bray MRN: [id number] DOB: [birth year] Today's Date: 08/28/2023  History of Present Illness  Pt is a 78 y/o female presenting after being found on floor (on floor 24 hours) at home after sliding from bed. Found to have traumatic rhabdomyolysis, mild troponinemia and dehydration. PMH: MS, HTN, cervical myelopathy.  Clinical Impression  Pt admitted with above diagnosis. Pt reports she ambulates with a rollator in the community and uses motorized scooter at home where she lives alone. Today pt required min A to stand from the toilet but was able to stand from a higher surface (recliner) without assistance. Pt ambulated 16' x 2 with RW, no loss of balance, no dizziness, no pain, but required ongoing verbal cues for safe positioning in RW for safety. Pt reports she has a life line at home but wasn't wearing it at time of fall, encouraged her to put it on when she gets home. Pt stated she can manage at home in her current condition, and that she has a neighbor that can assist as needed.  Pt currently with functional limitations due to the deficits listed below (see PT Problem List). Pt will benefit from acute skilled PT to increase their independence and safety with mobility to allow discharge.           If plan is discharge home, recommend the following: A little help with walking and/or transfers;A little help with bathing/dressing/bathroom;Assistance with cooking/housework;Assist for transportation;Help with stairs or ramp for entrance   Can travel by private vehicle        Equipment Recommendations None recommended by PT  Recommendations for Other Services       Functional Status Assessment Patient has had a recent decline in their functional status and demonstrates the ability to make significant improvements in function in a reasonable and predictable amount of time.     Precautions / Restrictions Precautions Precautions:  Fall Recall of Precautions/Restrictions: Intact Precaution/Restrictions Comments: denies other falls in past 6 months Restrictions Weight Bearing Restrictions Per Provider Order: No      Mobility  Bed Mobility               General bed mobility comments: up in recliner    Transfers Overall transfer level: Needs assistance Equipment used: Rolling walker (2 wheels) Transfers: Sit to/from Stand Sit to Stand: Contact guard assist, Min assist           General transfer comment: CGA for STS from recliner using armrests, min A to power up from commode using grab bar    Ambulation/Gait Ambulation/Gait assistance: Contact guard assist Gait Distance (Feet): 16 Feet Assistive device: Rolling walker (2 wheels) Gait Pattern/deviations: Step-to pattern, Decreased step length - right, Decreased step length - left, Trunk flexed Gait velocity: decr     General Gait Details: pt ambulates behind frame of RW, ongoing VCs to step into frame of RW, no loss of balance, pt difficulty advancing LEs into frame of RW  Stairs            Wheelchair Mobility     Tilt Bed    Modified Rankin (Stroke Patients Only)       Balance Overall balance assessment: Needs assistance Sitting-balance support: No upper extremity supported, Feet supported Sitting balance-Leahy Scale: Fair     Standing balance support: Bilateral upper extremity supported, During functional activity Standing balance-Leahy Scale: Poor  Pertinent Vitals/Pain Pain Assessment Pain Assessment: No/denies pain    Home Living Family/patient expects to be discharged to:: Private residence Living Arrangements: Alone Available Help at Discharge: Family;Friend(s);Neighbor;Available PRN/intermittently Type of Home: House Home Access: Level entry       Home Layout: One level Home Equipment: Electric scooter;Grab bars - toilet;Rollator (4 wheels) Additional Comments: pt  reports neighbor can assist as needed; pt reports she has a life line button but wasn't wearing it at time of fall, encouraged pt to wear it as soon as she gets home    Prior Function Prior Level of Function : Independent/Modified Independent;History of Falls (last six months)             Mobility Comments: uses scooter for mobility in the home, Rollator when outside of the home. pt reports one other slide out of bed 2 years ago but no other falls ADLs Comments: Reports Mod I for ADLs, standing for showers. Orders groceries, medicines, etc online. Drives self to aquatic center for aquatic exercises 2x/week     Extremity/Trunk Assessment   Upper Extremity Assessment Upper Extremity Assessment: Defer to OT evaluation    Lower Extremity Assessment Lower Extremity Assessment: LLE deficits/detail;RLE deficits/detail RLE Deficits / Details: hip flexion +3/5, knee ext 4/5, ankle DF 4/5 RLE Sensation: WNL LLE Deficits / Details: hip flexion +3/5, knee ext 4/5, ankle DF 4/5 LLE Sensation: WNL    Cervical / Trunk Assessment Cervical / Trunk Assessment: Kyphotic  Communication   Communication Communication: No apparent difficulties    Cognition Arousal: Alert Behavior During Therapy: WFL for tasks assessed/performed   PT - Cognitive impairments: No apparent impairments                         Following commands: Intact       Cueing Cueing Techniques: Verbal cues     General Comments General comments (skin integrity, edema, etc.): BP supine: 125/88  BP sitting: 118/74  BP after standing: 121/75    Exercises     Assessment/Plan    PT Assessment Patient needs continued PT services  PT Problem List Decreased activity tolerance;Decreased balance;Decreased strength       PT Treatment Interventions Gait training;Therapeutic exercise;Therapeutic activities;Patient/family education    PT Goals (Current goals can be found in the Care Plan section)  Acute Rehab PT  Goals Patient Stated Goal: return to exercising at the pool PT Goal Formulation: With patient Time For Goal Achievement: 09/11/23 Potential to Achieve Goals: Good    Frequency Min 3X/week     Co-evaluation               AM-PAC PT 6 Clicks Mobility  Outcome Measure Help needed turning from your back to your side while in a flat bed without using bedrails?: None Help needed moving from lying on your back to sitting on the side of a flat bed without using bedrails?: None Help needed moving to and from a bed to a chair (including a wheelchair)?: A Little Help needed standing up from a chair using your arms (e.g., wheelchair or bedside chair)?: A Little Help needed to walk in hospital room?: A Little Help needed climbing 3-5 steps with a railing? : A Little 6 Click Score: 20    End of Session Equipment Utilized During Treatment: Gait belt Activity Tolerance: Patient tolerated treatment well;No increased pain Patient left: in chair;with chair alarm set;with call bell/phone within reach Nurse Communication: Mobility status PT Visit Diagnosis: Muscle weakness (  generalized) (M62.81);Difficulty in walking, not elsewhere classified (R26.2);History of falling (Z91.81)    Time: 9068-9044 PT Time Calculation (min) (ACUTE ONLY): 24 min   Charges:   PT Evaluation $PT Eval Moderate Complexity: 1 Mod PT Treatments $Gait Training: 8-22 mins PT General Charges $$ ACUTE PT VISIT: 1 Visit         Amy Bray PT 08/28/2023  Acute Rehabilitation Services  Office [phone]

## 2023-08-28 NOTE — Plan of Care (Signed)
 Pt discharged with med reconciliation and instructions for follow-up.

## 2023-11-25 ENCOUNTER — Ambulatory Visit: Admitting: Neurology

## 2023-12-01 ENCOUNTER — Telehealth: Payer: Self-pay | Admitting: Neurology

## 2023-12-01 NOTE — Telephone Encounter (Signed)
 Discussed with Dr. Onita, pt will continue to schedule as B/B in the new year.

## 2023-12-01 NOTE — Telephone Encounter (Signed)
 Dr. Onita- this patient is seeing you for Xeomin  tomorrow. She has Medicare and cannot be SP in the new year, please review.

## 2023-12-02 ENCOUNTER — Ambulatory Visit (INDEPENDENT_AMBULATORY_CARE_PROVIDER_SITE_OTHER): Admitting: Neurology

## 2023-12-02 VITALS — BP 132/76

## 2023-12-02 DIAGNOSIS — R269 Unspecified abnormalities of gait and mobility: Secondary | ICD-10-CM | POA: Diagnosis not present

## 2023-12-02 DIAGNOSIS — G8220 Paraplegia, unspecified: Secondary | ICD-10-CM

## 2023-12-02 DIAGNOSIS — G8250 Quadriplegia, unspecified: Secondary | ICD-10-CM | POA: Diagnosis not present

## 2023-12-02 DIAGNOSIS — G35A Relapsing-remitting multiple sclerosis: Secondary | ICD-10-CM | POA: Diagnosis not present

## 2023-12-02 MED ORDER — INCOBOTULINUMTOXINA 100 UNITS IM SOLR
500.0000 [IU] | INTRAMUSCULAR | Status: AC
  Administered 2023-12-02: 500 [IU] via INTRAMUSCULAR

## 2023-12-02 NOTE — Progress Notes (Signed)
 xeomin  100units x 5 vial  [id number] [phone] Exp-2027/12 B/B  Bacteriostatic 0.9% Sodium Chloride - 10mL  Onu:[id number] Expiration: 11/19/24 NDC: [id number] Dx: G82.50 , g35, R26.9 WITNESSED BY:j webb

## 2023-12-02 NOTE — Progress Notes (Signed)
 ASSESSMENT AND PLAN  Amy Bray is a 78 y.o. female   Multiple sclerosis Spastic quadriplegia Electrical stimulation guided xeomin  injection, used xeomin  500 units (100 units was dissolved into 2 cc of NS)  Right adductor longus 200 units Right adductor magnus 50 units  Left adductor longus 200 units Left abductor magnus 50 units  Return to clinic in 3 months for repeat injection   DIAGNOSTIC DATA (LABS, IMAGING, TESTING) - I reviewed patient records, labs, notes, testing and imaging myself where available.   MEDICAL HISTORY:  Amy Bray, returned for botulism toxin injection for spastic bilateral lower extremity  She had a history of multiple sclerosis, presented with right leg stiffness, spasm, weakness in 2002, was diagnosed with cervical spine disease, had anterior cervical vasectomy and fusion in 2003. Following the surgery, her right leg weakness and spasms significantly improved, however a year later the symptoms returned, she ultimately had second cervical spine surgery in 2006, following second surgery, she only has mild improvement of her symptoms, she continue has progressive worsening spastic gait abnormality,  She was eventually diagnosed with multiple sclerosis, began to receive Tysabri  infusion, there was no significant change taking Tysabri , was eventually switched to Tecfidera  since 2022, tolerating it well,  She was receiving EMG guided Botox and toxin injection [facility] with good result, transferred to our clinic since August 2018, was receiving injection every 3 months without significant side effect, last injection was February 08, 2020, when she reported extreme stress at home, moved her mother to assisted living, have to self her mother's house, her husband suffered dementia, living memory unit,  She is also taking baclofen  10 mg 2 tablets 3 times a day as needed, Myrbetriq  25 mg every night, nortriptyline  50 mg every night, oxybutynin  XL 5 mg daily,  She  go to swing regularly, continue to be independent, lost her mother and husband at the beginning of this year,  Update December 18, 2021: She was found to injection well, does help her bilateral hip adduction, spasm,  UPDATE March 31 2022: Every 3 months injection has helped her bilateral lower extremity adductor muscle spasm, she can walk better, she continued to swim few times each week  No new multiple sclerosis symptoms, pending follow-up with Dr. Margaret in 2 weeks, on Tecfidera   UPDATE July 23 2022: She responded well to previous injection, no significant side effect noted, tendency for bilateral lower extremity adduction, left worse than right,  UPDATE Oct 29 2022: She is doing very well, continue water aerobic, the benefit of Botox lasting for 2 months, then noticed gradual worsening lower extremity spastic adduction  UPDATE Jan 15th 2025: She does think every 3 months xeomin  injection helped her lower extremity adduction, spasticity, continue to swim regularly  UPDATE April 16th 2025: She is well, remaining active, go up to meet her friend regularly, driving, water aerobic regularly, complains bilateral lower extremity adductor spasticity  UPDATE August 12 2023: She continued to do very well, no significant side effect noted with previous injection, did help her lower extremity spasticity,  UPDATE Dec 02 2023: Every 3 months injection did help her lower extremity spasticity, adduction  PHYSICAL EXAM:  Vitals:   12/02/23 1349  BP: 132/76   MOTOR: No significant upper extremity weakness, significant spasticity of bilateral lower extremity,  tendency for bilateral hip adduction, barely antigravity movement of bilateral lower extremity proximal and some muscle weakness  GAIT/STANCE: Rely on her walker, spastic, stiff, unsteady gait  REVIEW OF SYSTEMS:  Full  14 system review of systems performed and notable only for as above All other review of systems were  negative.   ALLERGIES: No Known Allergies  HOME MEDICATIONS: Current Outpatient Medications  Medication Sig Dispense Refill   acetaminophen  (TYLENOL ) 500 MG tablet Take 500 mg by mouth as needed for moderate pain (pain score 4-6).     baclofen  (LIORESAL ) 10 MG tablet TAKE TWO TABLETS BY MOUTH THREE TIMES DAILY AS NEEDED (Patient taking differently: Take 10 mg by mouth 4 (four) times daily. TAKE TWO TABLETS BY MOUTH THREE TIMES DAILY AS NEEDED) 540 tablet 4   Cranberry-Vitamin C -Vitamin E  4200-20-3 MG-MG-UNIT CAPS Take by mouth 2 times daily at 12 noon and 4 pm. 2 tabs twice daily     Dimethyl Fumarate  240 MG CPDR TAKE 1 CAPSULE BY MOUTH TWICE  DAILY 60 capsule 11   MYRBETRIQ  25 MG TB24 tablet Take 25 mg by mouth daily.     nortriptyline  (PAMELOR ) 50 MG capsule Take 1 capsule by mouth at bedtime.     oxybutynin  (DITROPAN -XL) 5 MG 24 hr tablet Take 5 mg by mouth daily.     traMADol -acetaminophen  (ULTRACET ) 37.5-325 MG per tablet Take 1 tablet by mouth daily as needed for moderate pain (pain score 4-6).     Current Facility-Administered Medications  Medication Dose Route Frequency Provider Last Rate Last Admin   incobotulinumtoxinA  (XEOMIN ) 100 units injection 500 Units  500 Units Intramuscular Q90 days Onita Duos, MD   500 Units at 03/31/22 1510   incobotulinumtoxinA  (XEOMIN ) 100 units injection 500 Units  500 Units Intramuscular Q90 days Onita Duos, MD   500 Units at 07/23/22 1228   incobotulinumtoxinA  (XEOMIN ) 100 units injection 500 Units  500 Units Intramuscular Q90 days Onita Duos, MD   500 Units at 02/04/23 1230   incobotulinumtoxinA  (XEOMIN ) 100 units injection 500 Units  500 Units Intramuscular Q90 days Onita Duos, MD   500 Units at 08/12/23 1632   incobotulinumtoxinA  (XEOMIN ) 100 units injection 500 Units  500 Units Intramuscular Q90 days Onita Duos, MD        PAST MEDICAL HISTORY: Past Medical History:  Diagnosis Date   Cervical myelopathy [facility])    Multiple sclerosis     Osteoporosis 2018   hip    PAST SURGICAL HISTORY: Past Surgical History:  Procedure Laterality Date   CERVICAL FUSION  01/2001, 01/2004   C3-C6    FAMILY HISTORY: Family History  Problem Relation Age of Onset   Transient ischemic attack Mother    Pancreatic cancer Father    Breast cancer Cousin     SOCIAL HISTORY: Social History   Socioeconomic History   Marital status: Married    Spouse name: Shalah Estelle   Number of children: 0   Years of education: College   Highest education level: Not on file  Occupational History    Employer: OTHER    Comment: n/a  Tobacco Use   Smoking status: Former    Current packs/day: 0.00    Average packs/day: 1 pack/day for 15.0 years (15.0 ttl pk-yrs)    Types: Cigarettes    Start date: 01/21/1968    Quit date: 01/21/1983    Years since quitting: 40.8   Smokeless tobacco: Never  Substance and Sexual Activity   Alcohol use: Yes    Alcohol/week: 0.0 standard drinks of alcohol    Comment: occasionally- wine   Drug use: No   Sexual activity: Not on file  Other Topics Concern   Not on file  Social  History Narrative   05/30/19 Patient lives at home alone,  Spouse in SNF for  dementia.   Caffeine Use-none      Update 04/14/23 - Patient lives alone. Spouse is deceased.    Social Drivers of Corporate Investment Banker Strain: Not on file  Food Insecurity: No Food Insecurity (08/27/2023)   Hunger Vital Sign    Worried About Running Out of Food in the Last Year: Never true    Ran Out of Food in the Last Year: Never true  Transportation Needs: No Transportation Needs (08/27/2023)   PRAPARE - Administrator, Civil Service (Medical): No    Lack of Transportation (Non-Medical): No  Physical Activity: Not on file  Stress: Not on file  Social Connections: Socially Isolated (08/27/2023)   Social Connection and Isolation Panel    Frequency of Communication with Friends and Family: Once a week    Frequency of Social Gatherings with Friends  and Family: Once a week    Attends Religious Services: Never    Database Administrator or Organizations: No    Attends Banker Meetings: Never    Marital Status: Widowed  Intimate Partner Violence: Not At Risk (08/27/2023)   Humiliation, Afraid, Rape, and Kick questionnaire    Fear of Current or Ex-Partner: No    Emotionally Abused: No    Physically Abused: No    Sexually Abused: No      Modena Callander, M.D. Ph.D.  [facility] [address], [address] [place], [place] [address] Ph: [phone] Fax: [phone]  CC:  Marvene Prentice SAUNDERS, FNP [address] [place],  [place] [address]  Marvene Prentice SAUNDERS, FNP

## 2023-12-21 ENCOUNTER — Other Ambulatory Visit: Payer: Self-pay | Admitting: Family Medicine

## 2023-12-21 DIAGNOSIS — Z1231 Encounter for screening mammogram for malignant neoplasm of breast: Secondary | ICD-10-CM

## 2024-01-18 ENCOUNTER — Ambulatory Visit

## 2024-01-26 ENCOUNTER — Ambulatory Visit
Admission: RE | Admit: 2024-01-26 | Discharge: 2024-01-26 | Disposition: A | Source: Ambulatory Visit | Attending: Family Medicine | Admitting: Family Medicine

## 2024-01-26 ENCOUNTER — Ambulatory Visit

## 2024-01-26 DIAGNOSIS — Z1231 Encounter for screening mammogram for malignant neoplasm of breast: Secondary | ICD-10-CM

## 2024-03-03 ENCOUNTER — Ambulatory Visit: Admitting: Neurology

## 2024-04-11 ENCOUNTER — Ambulatory Visit: Admitting: Diagnostic Neuroimaging
